# Patient Record
Sex: Female | Born: 1980 | Race: Black or African American | Hispanic: No | Marital: Single | Smoking: Current every day smoker
Health system: Southern US, Community
[De-identification: ages and names within clinical notes are randomized; demographics above are authoritative.]

## PROBLEM LIST (undated history)

## (undated) ENCOUNTER — Inpatient Hospital Stay (HOSPITAL_COMMUNITY): Payer: Self-pay

## (undated) DIAGNOSIS — R87629 Unspecified abnormal cytological findings in specimens from vagina: Secondary | ICD-10-CM

## (undated) DIAGNOSIS — R053 Chronic cough: Secondary | ICD-10-CM

## (undated) DIAGNOSIS — K5792 Diverticulitis of intestine, part unspecified, without perforation or abscess without bleeding: Secondary | ICD-10-CM

## (undated) DIAGNOSIS — B977 Papillomavirus as the cause of diseases classified elsewhere: Secondary | ICD-10-CM

## (undated) DIAGNOSIS — L732 Hidradenitis suppurativa: Secondary | ICD-10-CM

## (undated) DIAGNOSIS — I272 Pulmonary hypertension, unspecified: Secondary | ICD-10-CM

## (undated) DIAGNOSIS — I10 Essential (primary) hypertension: Secondary | ICD-10-CM

## (undated) DIAGNOSIS — M5431 Sciatica, right side: Secondary | ICD-10-CM

## (undated) HISTORY — DX: Sciatica, right side: M54.31

## (undated) HISTORY — DX: Essential (primary) hypertension: I10

## (undated) HISTORY — DX: Unspecified abnormal cytological findings in specimens from vagina: R87.629

## (undated) HISTORY — DX: Hidradenitis suppurativa: L73.2

## (undated) HISTORY — DX: Chronic cough: R05.3

## (undated) HISTORY — DX: Pulmonary hypertension, unspecified: I27.20

## (undated) HISTORY — PX: COLON SURGERY: SHX602

---
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
## 2002-04-14 ENCOUNTER — Emergency Department (HOSPITAL_COMMUNITY): Admission: EM | Admit: 2002-04-14 | Discharge: 2002-04-14 | Payer: Self-pay | Admitting: Emergency Medicine

## 2002-06-05 ENCOUNTER — Emergency Department (HOSPITAL_COMMUNITY): Admission: EM | Admit: 2002-06-05 | Discharge: 2002-06-05 | Payer: Self-pay | Admitting: Emergency Medicine

## 2002-06-05 ENCOUNTER — Encounter: Payer: Self-pay | Admitting: Emergency Medicine

## 2002-06-25 ENCOUNTER — Emergency Department (HOSPITAL_COMMUNITY): Admission: EM | Admit: 2002-06-25 | Discharge: 2002-06-25 | Payer: Self-pay | Admitting: Emergency Medicine

## 2002-07-14 ENCOUNTER — Emergency Department (HOSPITAL_COMMUNITY): Admission: EM | Admit: 2002-07-14 | Discharge: 2002-07-14 | Payer: Self-pay | Admitting: Emergency Medicine

## 2002-08-02 ENCOUNTER — Emergency Department (HOSPITAL_COMMUNITY): Admission: EM | Admit: 2002-08-02 | Discharge: 2002-08-02 | Payer: Self-pay | Admitting: Emergency Medicine

## 2002-09-13 ENCOUNTER — Emergency Department (HOSPITAL_COMMUNITY): Admission: EM | Admit: 2002-09-13 | Discharge: 2002-09-13 | Payer: Self-pay | Admitting: Emergency Medicine

## 2002-09-17 ENCOUNTER — Encounter: Payer: Self-pay | Admitting: Emergency Medicine

## 2002-09-17 ENCOUNTER — Ambulatory Visit (HOSPITAL_COMMUNITY): Admission: RE | Admit: 2002-09-17 | Discharge: 2002-09-17 | Payer: Self-pay | Admitting: Emergency Medicine

## 2004-02-25 ENCOUNTER — Emergency Department (HOSPITAL_COMMUNITY): Admission: EM | Admit: 2004-02-25 | Discharge: 2004-02-25 | Payer: Self-pay | Admitting: Emergency Medicine

## 2004-04-12 ENCOUNTER — Inpatient Hospital Stay (HOSPITAL_COMMUNITY): Admission: AD | Admit: 2004-04-12 | Discharge: 2004-04-12 | Payer: Self-pay | Admitting: Obstetrics

## 2004-04-14 ENCOUNTER — Inpatient Hospital Stay (HOSPITAL_COMMUNITY): Admission: AD | Admit: 2004-04-14 | Discharge: 2004-04-14 | Payer: Self-pay | Admitting: Obstetrics

## 2004-05-04 ENCOUNTER — Inpatient Hospital Stay (HOSPITAL_COMMUNITY): Admission: AD | Admit: 2004-05-04 | Discharge: 2004-05-04 | Payer: Self-pay | Admitting: Family Medicine

## 2004-05-19 ENCOUNTER — Inpatient Hospital Stay (HOSPITAL_COMMUNITY): Admission: AD | Admit: 2004-05-19 | Discharge: 2004-05-19 | Payer: Self-pay | Admitting: Obstetrics & Gynecology

## 2004-05-30 ENCOUNTER — Emergency Department (HOSPITAL_COMMUNITY): Admission: EM | Admit: 2004-05-30 | Discharge: 2004-05-30 | Payer: Self-pay | Admitting: Emergency Medicine

## 2004-08-01 ENCOUNTER — Emergency Department (HOSPITAL_COMMUNITY): Admission: EM | Admit: 2004-08-01 | Discharge: 2004-08-01 | Payer: Self-pay | Admitting: Emergency Medicine

## 2004-08-07 ENCOUNTER — Emergency Department (HOSPITAL_COMMUNITY): Admission: EM | Admit: 2004-08-07 | Discharge: 2004-08-07 | Payer: Self-pay | Admitting: Emergency Medicine

## 2004-10-20 ENCOUNTER — Emergency Department (HOSPITAL_COMMUNITY): Admission: EM | Admit: 2004-10-20 | Discharge: 2004-10-20 | Payer: Self-pay | Admitting: Emergency Medicine

## 2005-01-16 ENCOUNTER — Inpatient Hospital Stay (HOSPITAL_COMMUNITY): Admission: AD | Admit: 2005-01-16 | Discharge: 2005-01-16 | Payer: Self-pay | Admitting: Obstetrics & Gynecology

## 2005-02-22 ENCOUNTER — Ambulatory Visit: Payer: Self-pay | Admitting: Family Medicine

## 2005-02-24 ENCOUNTER — Ambulatory Visit: Payer: Self-pay | Admitting: *Deleted

## 2005-04-10 ENCOUNTER — Encounter (INDEPENDENT_AMBULATORY_CARE_PROVIDER_SITE_OTHER): Payer: Self-pay | Admitting: Family Medicine

## 2005-04-10 ENCOUNTER — Ambulatory Visit: Payer: Self-pay | Admitting: Family Medicine

## 2005-06-07 ENCOUNTER — Inpatient Hospital Stay (HOSPITAL_COMMUNITY): Admission: AD | Admit: 2005-06-07 | Discharge: 2005-06-07 | Payer: Self-pay | Admitting: Gynecology

## 2005-06-14 ENCOUNTER — Inpatient Hospital Stay (HOSPITAL_COMMUNITY): Admission: AD | Admit: 2005-06-14 | Discharge: 2005-06-14 | Payer: Self-pay | Admitting: Obstetrics and Gynecology

## 2005-06-19 ENCOUNTER — Inpatient Hospital Stay (HOSPITAL_COMMUNITY): Admission: AD | Admit: 2005-06-19 | Discharge: 2005-06-19 | Payer: Self-pay | Admitting: Gynecology

## 2005-06-29 ENCOUNTER — Other Ambulatory Visit: Admission: RE | Admit: 2005-06-29 | Discharge: 2005-06-29 | Payer: Self-pay | Admitting: Obstetrics and Gynecology

## 2005-08-14 ENCOUNTER — Emergency Department (HOSPITAL_COMMUNITY): Admission: EM | Admit: 2005-08-14 | Discharge: 2005-08-14 | Payer: Self-pay | Admitting: Emergency Medicine

## 2005-09-11 ENCOUNTER — Inpatient Hospital Stay (HOSPITAL_COMMUNITY): Admission: AD | Admit: 2005-09-11 | Discharge: 2005-09-11 | Payer: Self-pay | Admitting: Obstetrics and Gynecology

## 2005-11-24 ENCOUNTER — Inpatient Hospital Stay (HOSPITAL_COMMUNITY): Admission: AD | Admit: 2005-11-24 | Discharge: 2005-11-24 | Payer: Self-pay | Admitting: Obstetrics and Gynecology

## 2005-12-13 ENCOUNTER — Inpatient Hospital Stay (HOSPITAL_COMMUNITY): Admission: AD | Admit: 2005-12-13 | Discharge: 2005-12-13 | Payer: Self-pay | Admitting: Obstetrics and Gynecology

## 2006-01-16 ENCOUNTER — Inpatient Hospital Stay (HOSPITAL_COMMUNITY): Admission: AD | Admit: 2006-01-16 | Discharge: 2006-01-17 | Payer: Self-pay | Admitting: Obstetrics and Gynecology

## 2006-01-18 ENCOUNTER — Inpatient Hospital Stay (HOSPITAL_COMMUNITY): Admission: AD | Admit: 2006-01-18 | Discharge: 2006-01-18 | Payer: Self-pay | Admitting: Obstetrics and Gynecology

## 2006-02-05 ENCOUNTER — Inpatient Hospital Stay (HOSPITAL_COMMUNITY): Admission: AD | Admit: 2006-02-05 | Discharge: 2006-02-09 | Payer: Self-pay | Admitting: Obstetrics and Gynecology

## 2006-02-06 ENCOUNTER — Encounter (INDEPENDENT_AMBULATORY_CARE_PROVIDER_SITE_OTHER): Payer: Self-pay | Admitting: *Deleted

## 2006-03-09 ENCOUNTER — Other Ambulatory Visit: Admission: RE | Admit: 2006-03-09 | Discharge: 2006-03-09 | Payer: Self-pay | Admitting: Obstetrics and Gynecology

## 2006-04-01 ENCOUNTER — Emergency Department (HOSPITAL_COMMUNITY): Admission: EM | Admit: 2006-04-01 | Discharge: 2006-04-01 | Payer: Self-pay | Admitting: Emergency Medicine

## 2006-06-29 ENCOUNTER — Other Ambulatory Visit: Admission: RE | Admit: 2006-06-29 | Discharge: 2006-06-29 | Payer: Self-pay | Admitting: Obstetrics and Gynecology

## 2006-10-30 ENCOUNTER — Other Ambulatory Visit: Admission: RE | Admit: 2006-10-30 | Discharge: 2006-10-30 | Payer: Self-pay | Admitting: Obstetrics and Gynecology

## 2007-05-06 ENCOUNTER — Inpatient Hospital Stay (HOSPITAL_COMMUNITY): Admission: AD | Admit: 2007-05-06 | Discharge: 2007-05-06 | Payer: Self-pay | Admitting: Family Medicine

## 2008-01-24 ENCOUNTER — Inpatient Hospital Stay (HOSPITAL_COMMUNITY): Admission: AD | Admit: 2008-01-24 | Discharge: 2008-01-24 | Payer: Self-pay | Admitting: Obstetrics & Gynecology

## 2008-02-28 ENCOUNTER — Emergency Department (HOSPITAL_COMMUNITY): Admission: EM | Admit: 2008-02-28 | Discharge: 2008-02-29 | Payer: Self-pay | Admitting: Emergency Medicine

## 2009-12-24 ENCOUNTER — Emergency Department (HOSPITAL_COMMUNITY)
Admission: EM | Admit: 2009-12-24 | Discharge: 2009-12-24 | Payer: Self-pay | Source: Home / Self Care | Admitting: Emergency Medicine

## 2009-12-27 ENCOUNTER — Emergency Department (HOSPITAL_COMMUNITY)
Admission: EM | Admit: 2009-12-27 | Discharge: 2009-12-27 | Payer: Self-pay | Source: Home / Self Care | Admitting: Emergency Medicine

## 2010-04-18 LAB — CBC
HCT: 40.7 % (ref 36.0–46.0)
MCHC: 33 g/dL (ref 30.0–36.0)
MCV: 94.7 fL (ref 78.0–100.0)
Platelets: 294 10*3/uL (ref 150–400)
WBC: 10.2 10*3/uL (ref 4.0–10.5)

## 2010-04-18 LAB — WET PREP, GENITAL
Clue Cells Wet Prep HPF POC: NONE SEEN
Yeast Wet Prep HPF POC: NONE SEEN

## 2010-04-18 LAB — URINALYSIS, ROUTINE W REFLEX MICROSCOPIC: Ketones, ur: NEGATIVE mg/dL

## 2010-04-18 LAB — DIFFERENTIAL
Basophils Absolute: 0.1 10*3/uL (ref 0.0–0.1)
Basophils Relative: 1 % (ref 0–1)
Eosinophils Absolute: 0.3 10*3/uL (ref 0.0–0.7)
Lymphs Abs: 3 10*3/uL (ref 0.7–4.0)
Monocytes Absolute: 0.7 10*3/uL (ref 0.1–1.0)
Neutro Abs: 6.1 10*3/uL (ref 1.7–7.7)

## 2010-04-18 LAB — GC/CHLAMYDIA PROBE AMP, GENITAL: GC Probe Amp, Genital: NEGATIVE

## 2010-04-19 LAB — URINALYSIS, ROUTINE W REFLEX MICROSCOPIC
Bilirubin Urine: NEGATIVE
Glucose, UA: NEGATIVE mg/dL
Hgb urine dipstick: NEGATIVE
Nitrite: NEGATIVE
Protein, ur: NEGATIVE mg/dL
Urobilinogen, UA: 1 mg/dL (ref 0.0–1.0)
pH: 7.5 (ref 5.0–8.0)

## 2010-04-19 LAB — POCT PREGNANCY, URINE: Preg Test, Ur: NEGATIVE

## 2010-05-20 NOTE — Discharge Summary (Signed)
NAMESHANIA, BJELLAND                ACCOUNT NO.:  [id number]   MEDICAL RECORD NO.:  [id number]          PATIENT TYPE:  INP   LOCATION:  9141                          FACILITY:  [facility]   PHYSICIAN:  Rudy Jew. Ashley Royalty, M.D.DATE OF BIRTH:  03/13/1980   DATE OF ADMISSION:  02/05/2006  DATE OF DISCHARGE:  02/09/2006                               DISCHARGE SUMMARY   DISCHARGE DIAGNOSES:  1. Intrauterine pregnancy at 38 weeks 6 days' gestation, delivered.  2. Fibroid uterus.  3. Tobacco use.  4. CIN-1.  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 Spontaneous rupture of membranes.  7. Meconium stained amniotic fluid.  8. Arrest disorder of dilatation in labor.   OPERATIONS AND PROCEDURES:  Primary low transverse cesarean section.   CONSULTATIONS:  None.   DISCHARGE MEDICATIONS:  1. Percocet.  2. Motrin 800 mg.  3. Chromagen.   HISTORY AND PHYSICAL:  This is a 30 year old gravida 1, 38 weeks 6 days'  gestation with the aforementioned diagnoses.  The patient presented to  maternity admissions complaining of contractions.  Initial examination  revealed 3-cm dilatation.  For the remainder of the history and  physical, please see chart.   HOSPITAL COURSE:  The patient was admitted to [facility]  [place].  Admission laboratory studies were drawn.  She experienced  spontaneous rupture of her membranes which revealed meconium-stained  amniotic fluid.  Deceleration was noted and intrauterine pressure  catheter was placed as well as a fetal scalp lead.  The cervix was noted  to be 5-t0-6-cm at 11:35 on February 05, 2006.  Amnioinfusion was  initiated.  On February 06, 2006, at approximately 3 a.m. the patient was noted to  have an arrest disorder of dilatation.  Hence a primary cesarean section  was ordered.  The primary cesarean section was performed on February 06, 2006, and yielded a 7 pounds 1 ounce female, Apgar's of 3 at one minute; 6  at five minutes, 7 at ten minutes, sent to newborn  nursery.  Arterial  cord pH was 7.26.  An anterior fibroid was encountered as well.  The  procedure was uncomplicated.  The patient's postoperative course was characterized by an asymptomatic  anemia.  The patient was felt to be a candidate for discharge home on  February 09, 2006.  She was discharged home afebrile and in satisfactory  condition.   DISPOSITION:  The patient is to return to [facility] and  Obstetrics in 4 days.      James A. Ashley Royalty, M.D.  Electronically Signed     JAM/MEDQ  D:  04/02/2006  T:  04/02/2006  Job:  [id number]

## 2010-05-20 NOTE — Op Note (Signed)
Tina Riggs, Tina Riggs                ACCOUNT NO.:  [id number]   MEDICAL RECORD NO.:  [id number]          PATIENT TYPE:  INP   LOCATION:  9141                          FACILITY:  [facility]   PHYSICIAN:  Rudy Jew. Ashley Royalty, M.D.DATE OF BIRTH:  Oct 16, 1980   DATE OF PROCEDURE:  02/06/2006  DATE OF DISCHARGE:                               OPERATIVE REPORT   PREOPERATIVE DIAGNOSES:  1. Intrauterine pregnancy at 32 weeks' gestation.  2. Fibroid uterus.  3. Meconium-stained amniotic fluid -- particulate.  4. Arrest disorder of dilatation in labor.   POSTOPERATIVE DIAGNOSES:  1. Intrauterine pregnancy at 70 weeks' gestation.  2. Fibroid uterus.  3. Meconium-stained amniotic fluid -- particulate.  4. Arrest disorder of dilatation in labor.   PROCEDURE:  Primary low transverse cesarean section.   SURGEON:  Rudy Jew. Ashley Royalty, M.D.   ANESTHESIA:  Epidural, then general endotracheal.   FINDINGS:  A 7-pound 1-ounce female, Apgars of 3 at 1 minute, 6 at 5  minutes and 7 at 10 minutes, sent to the newborn nursery.  Arterial cord  pH 7.26.   ESTIMATED BLOOD LOSS:  800 mL.   COMPLICATIONS:  None.   PACK AND DRAINS:  Foley.   SPONGE NEEDLE AND INSTRUMENT COUNTS:  Reported as correct x2.   PROCEDURE:  The patient was taken to the operating room and placed in  the dorsal supine position.  Epidural anesthetic was dosed to a surgical  level.  The patient was then prepped and draped in the usual manner for  abdominal surgery.  Foley catheter had been previously placed.   The epidural had been noted to be somewhat spotty during labor and the  anesthesiologist requested some local 0.25% Marcaine to be injected into  the skin; approximately 10 mL were injected.  The patient felt the  infections despite the intended surgical levels of epidural anesthetic.  Hence, the decision was made by anesthesia to proceed with general  anesthesia.  After general endotracheal anesthesia was initiated, a  Pfannenstiel  incision was made down the fascia, which was nicked with a  knife and incised transversely with Mayo scissors.  The underlying  rectus muscles were separated from the fascia using sharp and blunt  dissection.  The rectus muscles were separated in the midline, exposing  the peritoneum, which was entered atraumatically with Metzenbaum  scissors.  The incision was extended longitudinally.  The self-retaining  retractor was placed in the abdominal cavity and positioned  appropriately.  The uterus was identified and a bladder flap created by  incising the anterior uterine serosa and sharply and bluntly dissecting  the bladder inferiorly.  It was held in place with the bladder blade.  The uterus was then entered through a low transverse incision using  sharp and blunt dissection.  Meconium-stained amniotic fluid was noted,  which was somewhat particulate.  The infant was then delivered from the  vertex presentation.  After delivery of the head, DeLee suction was  employed of the nasopharynx and oropharynx.  After delivery of the body,  the cord was triply clamped and cut and the infant given immediately  to  the awaiting pediatrics team, Dr. Eric Form in attendance.  Arterial cord  pH was obtained from an isolated segment of the umbilical cord.  Then  regular cord blood was obtained.  The placenta and membranes were  removed in their entirety and submitted to Pathology for histologic  studies.  The uterus was exteriorized.  A 3- to 4-cm anterior fundal  fibroid was encountered.  The uterus, tubes and ovaries were otherwise  normal in appearance.  The uterus was then closed in 2 running layers of  #1 Vicryl.  The first was a running locking layer.  The second was a  running, intermittently locking, and imbricating layer.  One additional  figure-of-eight suture was required to obtain hemostasis.  Hemostasis  was noted.  Uterus, tubes and ovaries were returned to the abdominal  cavity.  Irrigation was  accomplished.  Hemostasis was once again noted.  The peritoneum was then closed with 3-0 Vicryl in a running fashion.  The fascia was closed with 0 Vicryl in a running fashion.  The skin was  closed with staples.   The patient tolerated the procedure extremely well and was returned to  the recovery room in good condition.  At the conclusion of the  procedure, the urine was clear and copious.      James A. Ashley Royalty, M.D.  Electronically Signed     JAM/MEDQ  D:  02/06/2006  T:  02/06/2006  Job:  [id number]

## 2010-08-15 ENCOUNTER — Emergency Department (HOSPITAL_COMMUNITY)
Admission: EM | Admit: 2010-08-15 | Discharge: 2010-08-15 | Disposition: A | Discharge status: Home / Self Care | Payer: Medicaid Other | Attending: Emergency Medicine | Admitting: Emergency Medicine

## 2010-08-15 DIAGNOSIS — Y92009 Unspecified place in unspecified non-institutional (private) residence as the place of occurrence of the external cause: Secondary | ICD-10-CM | POA: Insufficient documentation

## 2010-08-15 DIAGNOSIS — W2203XA Walked into furniture, initial encounter: Secondary | ICD-10-CM | POA: Insufficient documentation

## 2010-08-15 DIAGNOSIS — S91109A Unspecified open wound of unspecified toe(s) without damage to nail, initial encounter: Secondary | ICD-10-CM | POA: Insufficient documentation

## 2010-08-27 ENCOUNTER — Inpatient Hospital Stay (INDEPENDENT_AMBULATORY_CARE_PROVIDER_SITE_OTHER)
Admission: RE | Admit: 2010-08-27 | Discharge: 2010-08-27 | Disposition: A | Discharge status: Home / Self Care | Payer: Medicaid Other | Source: Ambulatory Visit | Attending: Emergency Medicine | Admitting: Emergency Medicine

## 2010-08-27 DIAGNOSIS — B356 Tinea cruris: Secondary | ICD-10-CM

## 2010-08-27 DIAGNOSIS — R110 Nausea: Secondary | ICD-10-CM

## 2010-08-27 LAB — POCT URINALYSIS DIP (DEVICE)
Ketones, ur: NEGATIVE mg/dL
Protein, ur: NEGATIVE mg/dL
Specific Gravity, Urine: 1.025 (ref 1.005–1.030)
Urobilinogen, UA: 2 mg/dL — ABNORMAL HIGH (ref 0.0–1.0)

## 2010-08-27 LAB — WET PREP, GENITAL: Trich, Wet Prep: NONE SEEN

## 2010-08-29 LAB — GC/CHLAMYDIA PROBE AMP, GENITAL: GC Probe Amp, Genital: NEGATIVE

## 2010-08-29 LAB — URINE CULTURE: Colony Count: 30000

## 2010-09-03 HISTORY — PX: COLPOSCOPY: SHX161

## 2010-12-29 ENCOUNTER — Inpatient Hospital Stay (HOSPITAL_COMMUNITY)
Admission: AD | Admit: 2010-12-29 | Discharge: 2010-12-29 | Disposition: A | Discharge status: Home / Self Care | Payer: Medicaid Other | Source: Ambulatory Visit | Attending: Obstetrics | Admitting: Obstetrics

## 2010-12-29 ENCOUNTER — Encounter (HOSPITAL_COMMUNITY): Payer: Self-pay | Admitting: *Deleted

## 2010-12-29 DIAGNOSIS — N898 Other specified noninflammatory disorders of vagina: Secondary | ICD-10-CM

## 2010-12-29 LAB — WET PREP, GENITAL
Clue Cells Wet Prep HPF POC: NONE SEEN
Trich, Wet Prep: NONE SEEN

## 2010-12-29 LAB — URINALYSIS, ROUTINE W REFLEX MICROSCOPIC
Ketones, ur: NEGATIVE mg/dL
Leukocytes, UA: NEGATIVE
Nitrite: NEGATIVE
Protein, ur: NEGATIVE mg/dL
Urobilinogen, UA: 1 mg/dL (ref 0.0–1.0)

## 2010-12-29 LAB — URINE MICROSCOPIC-ADD ON

## 2010-12-29 MED ORDER — METRONIDAZOLE 500 MG PO TABS: 500.0000 mg | ORAL_TABLET | Freq: Two times a day (BID) | ORAL | Status: AC

## 2010-12-29 NOTE — ED Provider Notes (Signed)
History     Chief Complaint  Patient presents with  . Vaginal Discharge   HPI  Pt here with report of white vaginal discharge x 2 weeks.  Denies odor.  +vaginal bump that started yesterday.  +itching.  "Comes and goes".  Intermittent lower pelvic pain, none today.    Past Medical History  Diagnosis Date  . No pertinent past medical history     Past Surgical History  Procedure Date  . Cesarean section   . Colposcopy Sep 2012    No family history on file.  History  Substance Use Topics  . Smoking status: Current Everyday Smoker -- 0.5 packs/day for 12 years    Types: Cigarettes  . Smokeless tobacco: Not on file  . Alcohol Use: Yes     alcohol at least once a week    Allergies:  Allergies  Allergen Reactions  . Penicillins Rash    Prescriptions prior to admission  Medication Sig Dispense Refill  . Probiotic Product (ALIGN) 4 MG CAPS Take by mouth.          Review of Systems  Gastrointestinal: Negative for abdominal pain.  Genitourinary:       Vaginal discharge and lesion  All other systems reviewed and are negative.   Physical Exam   Blood pressure 134/93, pulse 81, temperature 98.7 F (37.1 C), temperature source Oral, resp. rate 20, height 5\' 6"  (1.676 m), weight 121.201 kg (267 lb 3.2 oz), SpO2 99.00%.  Physical Exam  Constitutional: She is oriented to person, place, and time. She appears well-developed and well-nourished. No distress.  HENT:  Head: Normocephalic.  Neck: Normal range of motion. Neck supple.  GI: Soft. She exhibits no mass. There is no tenderness. There is no guarding.  Genitourinary:    Vaginal discharge (white, creamy) found.       IUD strings visualized; apparatus not seen  Neurological: She is alert and oriented to person, place, and time. She has normal reflexes.  Skin: Skin is warm and dry.    MAU Course  Procedures Results for orders placed during the hospital encounter of 12/29/10 (from the past 24 hour(s))  URINALYSIS,  ROUTINE W REFLEX MICROSCOPIC     Status: Abnormal   Collection Time   12/29/10  4:50 PM      Component Value Range   Color, Urine YELLOW  YELLOW    APPearance HAZY (*) CLEAR    Specific Gravity, Urine >1.030 (*) 1.005 - 1.030    pH 6.0  5.0 - 8.0    Glucose, UA NEGATIVE  NEGATIVE (mg/dL)   Hgb urine dipstick SMALL (*) NEGATIVE    Bilirubin Urine NEGATIVE  NEGATIVE    Ketones, ur NEGATIVE  NEGATIVE (mg/dL)   Protein, ur NEGATIVE  NEGATIVE (mg/dL)   Urobilinogen, UA 1.0  0.0 - 1.0 (mg/dL)   Nitrite NEGATIVE  NEGATIVE    Leukocytes, UA NEGATIVE  NEGATIVE   URINE MICROSCOPIC-ADD ON     Status: Abnormal   Collection Time   12/29/10  4:50 PM      Component Value Range   Squamous Epithelial / LPF FEW (*) RARE    RBC / HPF 3-6  <3 (RBC/hpf)   Bacteria, UA RARE  RARE    Urine-Other MUCOUS PRESENT    WET PREP, GENITAL     Status: Abnormal   Collection Time   12/29/10  6:00 PM      Component Value Range   Yeast, Wet Prep NONE SEEN  NONE SEEN  Trich, Wet Prep NONE SEEN  NONE SEEN    Clue Cells, Wet Prep NONE SEEN  NONE SEEN    WBC, Wet Prep HPF POC MODERATE (*) NONE SEEN       Assessment and Plan  Vaginal Discharge  Plan: DC to home Rx Flagyl (per pt request) F/U prn  [facility] 12/29/2010, 6:12 PM

## 2010-12-29 NOTE — Progress Notes (Signed)
Patient states she has been having abdominal pain on and off, no pain at this time. Has a rash on her back and arms for 1-2 months. Has recurrent BV and is having a vaginal discharge that is watery. Has a bump in the hair and wants it checked. Has a Mirena since 2008 and wants it checked and possibly removed.

## 2010-12-31 LAB — GC/CHLAMYDIA PROBE AMP, GENITAL
Chlamydia, DNA Probe: NEGATIVE
GC Probe Amp, Genital: NEGATIVE

## 2011-08-09 ENCOUNTER — Encounter (HOSPITAL_BASED_OUTPATIENT_CLINIC_OR_DEPARTMENT_OTHER): Payer: Self-pay

## 2011-08-09 ENCOUNTER — Emergency Department (HOSPITAL_BASED_OUTPATIENT_CLINIC_OR_DEPARTMENT_OTHER)
Admission: EM | Admit: 2011-08-09 | Discharge: 2011-08-09 | Disposition: A | Discharge status: Home / Self Care | Payer: No Typology Code available for payment source | Attending: Emergency Medicine | Admitting: Emergency Medicine

## 2011-08-09 DIAGNOSIS — L03211 Cellulitis of face: Secondary | ICD-10-CM | POA: Insufficient documentation

## 2011-08-09 DIAGNOSIS — L0201 Cutaneous abscess of face: Secondary | ICD-10-CM | POA: Insufficient documentation

## 2011-08-09 DIAGNOSIS — Z880 Allergy status to penicillin: Secondary | ICD-10-CM | POA: Insufficient documentation

## 2011-08-09 DIAGNOSIS — L0291 Cutaneous abscess, unspecified: Secondary | ICD-10-CM

## 2011-08-09 DIAGNOSIS — F17200 Nicotine dependence, unspecified, uncomplicated: Secondary | ICD-10-CM | POA: Insufficient documentation

## 2011-08-09 MED ORDER — SULFAMETHOXAZOLE-TRIMETHOPRIM 800-160 MG PO TABS: 1.0000 | ORAL_TABLET | Freq: Two times a day (BID) | ORAL | Status: DC

## 2011-08-09 MED ORDER — TRAMADOL HCL 50 MG PO TABS: 50.0000 mg | ORAL_TABLET | Freq: Four times a day (QID) | ORAL | Status: DC | PRN

## 2011-08-09 NOTE — ED Provider Notes (Signed)
History     CSN: [id number]  Arrival date & time 08/09/11  1439   First MD Initiated Contact with Patient 08/09/11 1548      Chief Complaint  Patient presents with  . Rash    (Consider location/radiation/quality/duration/timing/severity/associated sxs/prior treatment) HPI  31 y.o. female in no acute distress complaining of several painful cysts to area in between eyebrows. Patient had the smaller cyst on the right for several weeks and recently developed a larger cyst on the last several days ago which is growing rapidly. Patient denies any fever, change in vision, redness to the eye or drainage from eye.  Past Medical History  Diagnosis Date  . No pertinent past medical history     Past Surgical History  Procedure Date  . Cesarean section   . Colposcopy Sep 2012    No family history on file.  History  Substance Use Topics  . Smoking status: Current Everyday Smoker -- 0.5 packs/day for 12 years    Types: Cigarettes  . Smokeless tobacco: Not on file  . Alcohol Use: Yes     alcohol at least once a week    OB History    Grav Para Term Preterm Abortions TAB SAB Ect Mult Living   1 1 1       1       Review of Systems  Skin:        Painful swelling lesions to area in between the eyebrows.  All other systems reviewed and are negative.    Allergies  Penicillins  Home Medications   Current Outpatient Rx  Name Route Sig Dispense Refill  . ALIGN 4 MG PO CAPS Oral Take by mouth.      . SULFAMETHOXAZOLE-TRIMETHOPRIM 800-160 MG PO TABS Oral Take 1 tablet by mouth 2 (two) times daily. 14 tablet 0  . TRAMADOL HCL 50 MG PO TABS Oral Take 1 tablet (50 mg total) by mouth every 6 (six) hours as needed for pain. 15 tablet 0    BP 130/103  Pulse 74  Temp 98.3 F (36.8 C) (Oral)  Resp 16  Ht 5\' 6"  (1.676 m)  Wt 230 lb (104.327 kg)  BMI 37.12 kg/m2  SpO2 100%  Physical Exam  Nursing note and vitals reviewed. Constitutional: She is oriented to person, place, and  time. She appears well-developed and well-nourished. No distress.  HENT:  Head: Normocephalic.  Eyes: Conjunctivae and EOM are normal. Pupils are equal, round, and reactive to light. Right eye exhibits no discharge. Left eye exhibits no discharge. No scleral icterus.       No conjunctival injection bilaterally. No discharge  Cardiovascular: Normal rate.   Pulmonary/Chest: Effort normal.  Abdominal: Soft.  Musculoskeletal: Normal range of motion.  Neurological: She is alert and oriented to person, place, and time.  Skin:       2 indurated lesions one approximately 5 mm and the other approximately 8 mm to area between the eyebrows. Lesions are tender with no fluctuance. No swelling of the eyelids  Psychiatric: She has a normal mood and affect.    ED Course  Procedures (including critical care time)  Labs Reviewed - No data to display No results found.   1. Abscess       MDM  I doubt dacryocystitis based on the location and lack of discharge or conjunctival involvement. I think this is a case of cystic acne or abscess to the area in between the eyebrows. Based on the level of firmness and induration  I do not think that incision and drainage would be beneficial at this time. I will encourage the patient to take antibiotics and apply warm compresses off into a chair the area so fluctuance may be palpated an incision and drainage will be effective. I will give her Bactrim DS by mouth twice a day X7 in addition to tramadol for comfort. Pt verbalized understanding and agrees with care plan. Outpatient follow-up and return precautions given.           Wynetta Emery, PA-C 08/09/11 1620

## 2011-08-09 NOTE — ED Notes (Signed)
Pt reports "bumps" on eyelid.

## 2011-08-09 NOTE — ED Provider Notes (Signed)
Medical screening examination/treatment/procedure(s) were performed by non-physician practitioner and as supervising physician I was immediately available for consultation/collaboration.   Carleene Cooper III, MD 08/09/11 [phone]

## 2011-08-10 ENCOUNTER — Encounter (HOSPITAL_BASED_OUTPATIENT_CLINIC_OR_DEPARTMENT_OTHER): Payer: Self-pay

## 2011-08-10 ENCOUNTER — Emergency Department (HOSPITAL_BASED_OUTPATIENT_CLINIC_OR_DEPARTMENT_OTHER)
Admission: EM | Admit: 2011-08-10 | Discharge: 2011-08-10 | Disposition: A | Discharge status: Home / Self Care | Payer: No Typology Code available for payment source | Attending: Emergency Medicine | Admitting: Emergency Medicine

## 2011-08-10 DIAGNOSIS — F17200 Nicotine dependence, unspecified, uncomplicated: Secondary | ICD-10-CM | POA: Insufficient documentation

## 2011-08-10 DIAGNOSIS — L0201 Cutaneous abscess of face: Secondary | ICD-10-CM | POA: Insufficient documentation

## 2011-08-10 DIAGNOSIS — L03211 Cellulitis of face: Secondary | ICD-10-CM | POA: Insufficient documentation

## 2011-08-10 NOTE — ED Notes (Signed)
C/o bump on left eye lid-was seen yesterday and advised to come back if started having d/c-states d/c stared approx 1 hour PTA

## 2011-08-10 NOTE — ED Provider Notes (Signed)
History     CSN: [id number]  Arrival date & time 08/10/11  2059   First MD Initiated Contact with Patient 08/10/11 2128      Chief Complaint  Patient presents with  . Follow-up    (Consider location/radiation/quality/duration/timing/severity/associated sxs/prior treatment) HPI Comments: Tina Riggs is a 31 y.o. Female who presents with complaint of swelling, drainage wound to the left eye lid. States was seen for the same yesterday. Was treated here with antibiotics, pain medications, states was told to return if started draining or worsened. Pt requesting cultures to make sure not MRSA. Pt stats it opened up some and began draining. No fever, Chills, malaise. Wound is the the medial left upper eye lid.    Past Medical History  Diagnosis Date  . No pertinent past medical history     Past Surgical History  Procedure Date  . Cesarean section   . Colposcopy Sep 2012    No family history on file.  History  Substance Use Topics  . Smoking status: Current Everyday Smoker -- 0.5 packs/day for 12 years    Types: Cigarettes  . Smokeless tobacco: Not on file  . Alcohol Use: Yes     alcohol at least once a week    OB History    Grav Para Term Preterm Abortions TAB SAB Ect Mult Living   1 1 1       1       Review of Systems  Constitutional: Negative for fever and chills.  HENT: Negative for neck pain and neck stiffness.   Eyes: Positive for pain.  Respiratory: Negative.   Cardiovascular: Negative.   Skin: Positive for wound.    Allergies  Penicillins  Home Medications   Current Outpatient Rx  Name Route Sig Dispense Refill  . SULFAMETHOXAZOLE-TRIMETHOPRIM 800-160 MG PO TABS Oral Take 1 tablet by mouth 2 (two) times daily.    . TRAMADOL HCL 50 MG PO TABS Oral Take 50 mg by mouth every 6 (six) hours as needed. For pain.      BP 140/93  Pulse 59  Temp 98.2 F (36.8 C) (Oral)  Resp 18  Ht 5\' 6"  (1.676 m)  Wt 235 lb (106.595 kg)  BMI 37.93 kg/m2  SpO2  100%  Physical Exam  Nursing note and vitals reviewed. Constitutional: She appears well-developed and well-nourished. No distress.  HENT:  Head: Normocephalic.  Eyes: Conjunctivae and EOM are normal. Pupils are equal, round, and reactive to light.       There is a 2cm abscess to the medical left upper eye lid adjacent to the nose. Purulent drainage with mild compression. Tender. No surrounding cellulitis. No pain with extraocular movement  Neck: Normal range of motion. Neck supple.  Cardiovascular: Normal rate, regular rhythm and normal heart sounds.   Pulmonary/Chest: Effort normal and breath sounds normal. No respiratory distress. She has no wheezes. She has no rales.  Lymphadenopathy:    She has no cervical adenopathy.  Skin: Skin is warm and dry.  Psychiatric: She has a normal mood and affect.    ED Course  Procedures (including critical care time)  I cleaned pts abscess with alcohol and iodine. Used 21 gauge sterile needle to make an opening. Purulent drainage out. Large amount. Pt on bactrim for infection. i sent cultures. Pain well managed at home. Recommended warm compresses. Follow up as needed if worsening.   1. Abscess of face       MDM  Lottie Mussel, [place] 08/10/11 2204

## 2011-08-11 NOTE — ED Provider Notes (Signed)
Medical screening examination/treatment/procedure(s) were performed by non-physician practitioner and as supervising physician I was immediately available for consultation/collaboration.    Vida Roller, MD 08/11/11 [phone]

## 2011-08-13 LAB — WOUND CULTURE
Culture: NO GROWTH
Special Requests: NORMAL

## 2011-09-10 ENCOUNTER — Emergency Department (HOSPITAL_BASED_OUTPATIENT_CLINIC_OR_DEPARTMENT_OTHER)
Admission: EM | Admit: 2011-09-10 | Discharge: 2011-09-10 | Disposition: A | Discharge status: Home / Self Care | Payer: Medicaid Other

## 2011-09-18 ENCOUNTER — Emergency Department (HOSPITAL_BASED_OUTPATIENT_CLINIC_OR_DEPARTMENT_OTHER)
Admission: EM | Admit: 2011-09-18 | Discharge: 2011-09-18 | Disposition: A | Discharge status: Home / Self Care | Payer: PRIVATE HEALTH INSURANCE | Attending: Emergency Medicine | Admitting: Emergency Medicine

## 2011-09-18 ENCOUNTER — Encounter (HOSPITAL_BASED_OUTPATIENT_CLINIC_OR_DEPARTMENT_OTHER): Payer: Self-pay | Admitting: *Deleted

## 2011-09-18 DIAGNOSIS — L03211 Cellulitis of face: Secondary | ICD-10-CM | POA: Insufficient documentation

## 2011-09-18 DIAGNOSIS — L0291 Cutaneous abscess, unspecified: Secondary | ICD-10-CM

## 2011-09-18 DIAGNOSIS — Z880 Allergy status to penicillin: Secondary | ICD-10-CM | POA: Insufficient documentation

## 2011-09-18 DIAGNOSIS — L0201 Cutaneous abscess of face: Secondary | ICD-10-CM | POA: Insufficient documentation

## 2011-09-18 DIAGNOSIS — F17200 Nicotine dependence, unspecified, uncomplicated: Secondary | ICD-10-CM | POA: Insufficient documentation

## 2011-09-18 MED ORDER — SULFAMETHOXAZOLE-TRIMETHOPRIM 800-160 MG PO TABS: 2.0000 | ORAL_TABLET | Freq: Two times a day (BID) | ORAL | Status: DC

## 2011-09-18 MED ORDER — CEPHALEXIN 500 MG PO CAPS: 1000.0000 mg | ORAL_CAPSULE | Freq: Two times a day (BID) | ORAL | Status: DC

## 2011-09-18 NOTE — ED Provider Notes (Signed)
History     CSN: [id number]  Arrival date & time 09/18/11  1558   First MD Initiated Contact with Patient 09/18/11 1626      Chief Complaint  Patient presents with  . Abscess    (Consider location/radiation/quality/duration/timing/severity/associated sxs/prior treatment) HPI  31 y.o. female in no acute distress complaining of recurrent abscess to area between her eyebrows. Patient was seen for similar here several weeks ago and followed with her primary care Dr. Who instructed her to return to the emergency room for abscess evaluation. Patient denies any fever, nausea vomiting. She states that there is a scant amount of purulent drainage when she pushes on it.  Past Medical History  Diagnosis Date  . No pertinent past medical history     Past Surgical History  Procedure Date  . Cesarean section   . Colposcopy Sep 2012    No family history on file.  History  Substance Use Topics  . Smoking status: Current Every Day Smoker -- 0.5 packs/day for 12 years    Types: Cigarettes  . Smokeless tobacco: Not on file  . Alcohol Use: Yes     alcohol at least once a week    OB History    Grav Para Term Preterm Abortions TAB SAB Ect Mult Living   1 1 1       1       Review of Systems  Constitutional: Negative for fever.  Respiratory: Negative for shortness of breath.   Cardiovascular: Negative for chest pain.  Gastrointestinal: Negative for nausea, vomiting, abdominal pain and diarrhea.  Skin: Positive for rash.  All other systems reviewed and are negative.    Allergies  Penicillins  Home Medications  No current outpatient prescriptions on file.  BP 143/96  Pulse 64  Temp 98.1 F (36.7 C) (Oral)  Resp 18  SpO2 100%  Physical Exam  Nursing note and vitals reviewed. Constitutional: She is oriented to person, place, and time. She appears well-developed and well-nourished. No distress.  HENT:  Head: Normocephalic.  Eyes: Conjunctivae normal and EOM are normal.    Cardiovascular: Normal rate.   Pulmonary/Chest: Effort normal. No stridor.  Abdominal: Soft. Bowel sounds are normal.  Musculoskeletal: Normal range of motion.  Neurological: She is alert and oriented to person, place, and time.  Skin:       2x 1/4 to 1/2 cm papules to area between the eyebrows. No fluctuance.   Psychiatric: She has a normal mood and affect.    ED Course  Procedures (including critical care time)  Labs Reviewed - No data to display No results found.   1. Abscess       MDM   Small papules/abscesses to forehead. I will advise patient to follow with dermatology and write her a prescription for antibiotics.  Pt verbalized understanding and agrees with care plan. Outpatient follow-up and return precautions given.           Wynetta Emery, PA-C 09/18/11 1715

## 2011-09-18 NOTE — ED Notes (Signed)
Skin Abscess x 2 between her eyes.

## 2011-09-19 NOTE — ED Provider Notes (Signed)
Medical screening examination/treatment/procedure(s) were performed by non-physician practitioner and as supervising physician I was immediately available for consultation/collaboration.  Hurman Horn, MD 09/19/11 [phone]

## 2012-01-06 ENCOUNTER — Emergency Department (HOSPITAL_BASED_OUTPATIENT_CLINIC_OR_DEPARTMENT_OTHER)
Admission: EM | Admit: 2012-01-06 | Discharge: 2012-01-06 | Disposition: A | Discharge status: Home / Self Care | Payer: Self-pay | Attending: Emergency Medicine | Admitting: Emergency Medicine

## 2012-01-06 ENCOUNTER — Encounter (HOSPITAL_BASED_OUTPATIENT_CLINIC_OR_DEPARTMENT_OTHER): Payer: Self-pay | Admitting: *Deleted

## 2012-01-06 DIAGNOSIS — Z975 Presence of (intrauterine) contraceptive device: Secondary | ICD-10-CM | POA: Insufficient documentation

## 2012-01-06 DIAGNOSIS — Z309 Encounter for contraceptive management, unspecified: Secondary | ICD-10-CM | POA: Insufficient documentation

## 2012-01-06 DIAGNOSIS — N898 Other specified noninflammatory disorders of vagina: Secondary | ICD-10-CM | POA: Insufficient documentation

## 2012-01-06 DIAGNOSIS — Z202 Contact with and (suspected) exposure to infections with a predominantly sexual mode of transmission: Secondary | ICD-10-CM | POA: Insufficient documentation

## 2012-01-06 DIAGNOSIS — R109 Unspecified abdominal pain: Secondary | ICD-10-CM | POA: Insufficient documentation

## 2012-01-06 DIAGNOSIS — Z3202 Encounter for pregnancy test, result negative: Secondary | ICD-10-CM | POA: Insufficient documentation

## 2012-01-06 DIAGNOSIS — F17200 Nicotine dependence, unspecified, uncomplicated: Secondary | ICD-10-CM | POA: Insufficient documentation

## 2012-01-06 LAB — URINE MICROSCOPIC-ADD ON

## 2012-01-06 LAB — URINALYSIS, ROUTINE W REFLEX MICROSCOPIC
Bilirubin Urine: NEGATIVE
Glucose, UA: NEGATIVE mg/dL
Ketones, ur: NEGATIVE mg/dL
Protein, ur: NEGATIVE mg/dL
Urobilinogen, UA: 1 mg/dL (ref 0.0–1.0)

## 2012-01-06 LAB — WET PREP, GENITAL

## 2012-01-06 LAB — PREGNANCY, URINE: Preg Test, Ur: NEGATIVE

## 2012-01-06 MED ORDER — LIDOCAINE HCL (PF) 1 % IJ SOLN
INTRAMUSCULAR | Status: AC
  Administered 2012-01-06: 2.3 mL
  Filled 2012-01-06: qty 5

## 2012-01-06 MED ORDER — CEFTRIAXONE SODIUM 250 MG IJ SOLR
250.0000 mg | Freq: Once | INTRAMUSCULAR | Status: AC
  Administered 2012-01-06: 250 mg via INTRAMUSCULAR
  Filled 2012-01-06: qty 250

## 2012-01-06 MED ORDER — CEFTRIAXONE SODIUM 250 MG IJ SOLR: 250.0000 mg | INTRAMUSCULAR | Status: DC

## 2012-01-06 MED ORDER — AZITHROMYCIN 250 MG PO TABS
1000.0000 mg | ORAL_TABLET | Freq: Once | ORAL | Status: AC
  Administered 2012-01-06: 1000 mg via ORAL
  Filled 2012-01-06: qty 4

## 2012-01-06 NOTE — ED Notes (Signed)
Patient would like to be checked for STD's. States that her ex-partner told her she may want to be seen.

## 2012-01-06 NOTE — ED Provider Notes (Signed)
History     CSN: [id number]  Arrival date & time 01/06/12  1105   First MD Initiated Contact with Patient 01/06/12 1119      Chief Complaint  Patient presents with  . Exposure to STD    (Consider location/radiation/quality/duration/timing/severity/associated sxs/prior treatment) HPI Comments: Patient presents with an STD exposure. She states that her ex-partner has advised her that she needs to get checked for STDs but did not indicate which STD that he had. She has had a history of STDs in the past but cannot clarify the exact one. She's had some intermittent low abdominal cramping but denies any abdominal pain now. She denies any vaginal discharge. She's had a little bit of itching and burning around her vaginal area. She has a birth control implant in and has had some spasms body noted the last 2-3 days. She denies any nausea vomiting or diarrhea. She denies any UTI symptoms.  Patient is a 32 y.o. female presenting with STD exposure.  Exposure to STD Associated symptoms include abdominal pain. Pertinent negatives include no chest pain, no headaches and no shortness of breath.    Past Medical History  Diagnosis Date  . No pertinent past medical history     Past Surgical History  Procedure Date  . Cesarean section   . Colposcopy Sep 2012    No family history on file.  History  Substance Use Topics  . Smoking status: Current Every Day Smoker -- 0.5 packs/day for 12 years    Types: Cigarettes  . Smokeless tobacco: Not on file  . Alcohol Use: Yes     Comment: alcohol at least once a week    OB History    Grav Para Term Preterm Abortions TAB SAB Ect Mult Living   1 1 1       1       Review of Systems  Constitutional: Negative for fever, chills, diaphoresis and fatigue.  HENT: Negative for congestion, rhinorrhea and sneezing.   Eyes: Negative.   Respiratory: Negative for cough, chest tightness and shortness of breath.   Cardiovascular: Negative for chest pain and leg  swelling.  Gastrointestinal: Positive for abdominal pain. Negative for nausea, vomiting, diarrhea and blood in stool.  Genitourinary: Positive for vaginal bleeding. Negative for frequency, hematuria, flank pain and difficulty urinating.  Musculoskeletal: Negative for back pain and arthralgias.  Skin: Negative for rash.  Neurological: Negative for dizziness, speech difficulty, weakness, numbness and headaches.    Allergies  Penicillins  Home Medications   Current Outpatient Rx  Name  Route  Sig  Dispense  Refill  . CEPHALEXIN 500 MG PO CAPS   Oral   Take 2 capsules (1,000 mg total) by mouth 2 (two) times daily.   28 capsule   0   . SULFAMETHOXAZOLE-TRIMETHOPRIM 800-160 MG PO TABS   Oral   Take 2 tablets by mouth 2 (two) times daily.   28 tablet   0     There were no vitals taken for this visit.  Physical Exam  Constitutional: She is oriented to person, place, and time. She appears well-developed and well-nourished.  HENT:  Head: Normocephalic and atraumatic.  Eyes: Pupils are equal, round, and reactive to light.  Neck: Normal range of motion. Neck supple.  Cardiovascular: Normal rate, regular rhythm and normal heart sounds.   Pulmonary/Chest: Effort normal and breath sounds normal. No respiratory distress. She has no wheezes. She has no rales. She exhibits no tenderness.  Abdominal: Soft. Bowel sounds are normal. There  is no tenderness. There is no rebound and no guarding.  Genitourinary:       Small amount of dark blood in the vault. No active bleeding. No discharge is noted. No rashes or lesions are noted. No cervical motion tenderness or adnexal tenderness.  Musculoskeletal: Normal range of motion. She exhibits no edema.  Lymphadenopathy:    She has no cervical adenopathy.  Neurological: She is alert and oriented to person, place, and time.  Skin: Skin is warm and dry. No rash noted.  Psychiatric: She has a normal mood and affect.    ED Course  Procedures  (including critical care time)  Results for orders placed during the hospital encounter of 01/06/12  PREGNANCY, URINE      Component Value Range   Preg Test, Ur NEGATIVE  NEGATIVE  WET PREP, GENITAL      Component Value Range   Yeast Wet Prep HPF POC NONE SEEN  NONE SEEN   Trich, Wet Prep NONE SEEN  NONE SEEN   Clue Cells Wet Prep HPF POC FEW (*) NONE SEEN   WBC, Wet Prep HPF POC FEW (*) NONE SEEN   No results found.    1. Exposure to STD       MDM  Patient was treated with Rocephin and Zithromax here in emergency pertinent for STD exposure. She's currently asymptomatic. I did counsel her regarding going to the health department for her gynecologist for further testing such as HIV and syphilis.        Rolan Bucco, MD 01/06/12 [phone]

## 2012-01-08 LAB — GC/CHLAMYDIA PROBE AMP
CT Probe RNA: NEGATIVE
GC Probe RNA: NEGATIVE

## 2012-02-07 ENCOUNTER — Encounter (HOSPITAL_BASED_OUTPATIENT_CLINIC_OR_DEPARTMENT_OTHER): Payer: Self-pay

## 2012-02-07 ENCOUNTER — Emergency Department (HOSPITAL_BASED_OUTPATIENT_CLINIC_OR_DEPARTMENT_OTHER)
Admission: EM | Admit: 2012-02-07 | Discharge: 2012-02-07 | Disposition: A | Discharge status: Home / Self Care | Payer: Self-pay | Attending: Emergency Medicine | Admitting: Emergency Medicine

## 2012-02-07 DIAGNOSIS — N898 Other specified noninflammatory disorders of vagina: Secondary | ICD-10-CM | POA: Insufficient documentation

## 2012-02-07 DIAGNOSIS — N760 Acute vaginitis: Secondary | ICD-10-CM | POA: Insufficient documentation

## 2012-02-07 DIAGNOSIS — Z79899 Other long term (current) drug therapy: Secondary | ICD-10-CM | POA: Insufficient documentation

## 2012-02-07 DIAGNOSIS — F17200 Nicotine dependence, unspecified, uncomplicated: Secondary | ICD-10-CM | POA: Insufficient documentation

## 2012-02-07 DIAGNOSIS — B9689 Other specified bacterial agents as the cause of diseases classified elsewhere: Secondary | ICD-10-CM

## 2012-02-07 DIAGNOSIS — R109 Unspecified abdominal pain: Secondary | ICD-10-CM | POA: Insufficient documentation

## 2012-02-07 DIAGNOSIS — Z3202 Encounter for pregnancy test, result negative: Secondary | ICD-10-CM | POA: Insufficient documentation

## 2012-02-07 DIAGNOSIS — R42 Dizziness and giddiness: Secondary | ICD-10-CM | POA: Insufficient documentation

## 2012-02-07 LAB — URINALYSIS, ROUTINE W REFLEX MICROSCOPIC
Bilirubin Urine: NEGATIVE
Glucose, UA: NEGATIVE mg/dL
Ketones, ur: NEGATIVE mg/dL
Nitrite: NEGATIVE
Specific Gravity, Urine: 1.03 (ref 1.005–1.030)
pH: 6 (ref 5.0–8.0)

## 2012-02-07 LAB — CBC WITH DIFFERENTIAL/PLATELET
Eosinophils Absolute: 0.3 10*3/uL (ref 0.0–0.7)
HCT: 39.3 % (ref 36.0–46.0)
Lymphocytes Relative: 35 % (ref 12–46)
Lymphs Abs: 3.3 10*3/uL (ref 0.7–4.0)
MCH: 31.5 pg (ref 26.0–34.0)
Monocytes Relative: 9 % (ref 3–12)
Neutrophils Relative %: 52 % (ref 43–77)
WBC: 9.3 10*3/uL (ref 4.0–10.5)

## 2012-02-07 LAB — URINE MICROSCOPIC-ADD ON

## 2012-02-07 LAB — WET PREP, GENITAL: Trich, Wet Prep: NONE SEEN

## 2012-02-07 MED ORDER — METRONIDAZOLE 500 MG PO TABS: 500.0000 mg | ORAL_TABLET | Freq: Two times a day (BID) | ORAL | Status: DC

## 2012-02-07 NOTE — ED Notes (Signed)
Pt states she thinks she is having an issue like "BV" again. C/O vaginal d/c with a foul odor.

## 2012-02-07 NOTE — ED Notes (Signed)
PA at bedside.

## 2012-02-07 NOTE — ED Provider Notes (Signed)
History     CSN: [id number]  Arrival date & time 02/07/12  6962   First MD Initiated Contact with Patient 02/07/12 1951      Chief Complaint  Patient presents with  . Headache    (Consider location/radiation/quality/duration/timing/severity/associated sxs/prior treatment) Patient is a 32 y.o. female presenting with vaginal discharge. The history is provided by the patient. No language interpreter was used.  Vaginal Discharge This is a new problem. The current episode started 1 to 4 weeks ago. The problem occurs constantly. The problem has been gradually worsening. Pertinent negatives include no abdominal pain. Nothing aggravates the symptoms. She has tried nothing for the symptoms. The treatment provided no relief.    Past Medical History  Diagnosis Date  . No pertinent past medical history     Past Surgical History  Procedure Date  . Cesarean section   . Colposcopy Sep 2012    No family history on file.  History  Substance Use Topics  . Smoking status: Current Every Day Smoker -- 0.5 packs/day for 12 years    Types: Cigarettes  . Smokeless tobacco: Not on file  . Alcohol Use: Yes    OB History    Grav Para Term Preterm Abortions TAB SAB Ect Mult Living   1 1 1       1       Review of Systems  Gastrointestinal: Negative for abdominal pain.  Genitourinary: Positive for vaginal discharge.  All other systems reviewed and are negative.    Allergies  Penicillins  Home Medications   Current Outpatient Rx  Name  Route  Sig  Dispense  Refill  . CEPHALEXIN 500 MG PO CAPS   Oral   Take 2 capsules (1,000 mg total) by mouth 2 (two) times daily.   28 capsule   0   . SULFAMETHOXAZOLE-TRIMETHOPRIM 800-160 MG PO TABS   Oral   Take 2 tablets by mouth 2 (two) times daily.   28 tablet   0     BP 147/96  Pulse 68  Temp 98.1 F (36.7 C) (Oral)  Resp 16  Ht 5\' 6"  (1.676 m)  Wt 241 lb (109.317 kg)  BMI 38.90 kg/m2  SpO2 100%  Physical Exam  Nursing note  and vitals reviewed. Constitutional: She is oriented to person, place, and time. She appears well-developed and well-nourished.  HENT:  Head: Normocephalic.  Neck: Normal range of motion.  Cardiovascular: Normal rate and normal heart sounds.   Pulmonary/Chest: Effort normal.  Abdominal: Soft. Bowel sounds are normal.  Genitourinary: Vaginal discharge found.       Thick white vaginal discharge,  Adnexa nontender,  Cervix nontender  Musculoskeletal: Normal range of motion.  Neurological: She is alert and oriented to person, place, and time.  Skin: Skin is warm and dry.    ED Course  Procedures (including critical care time)  Labs Reviewed  URINALYSIS, ROUTINE W REFLEX MICROSCOPIC - Abnormal; Notable for the following:    APPearance CLOUDY (*)     Hgb urine dipstick TRACE (*)     Leukocytes, UA LARGE (*)     All other components within normal limits  URINE MICROSCOPIC-ADD ON - Abnormal; Notable for the following:    Squamous Epithelial / LPF MANY (*)     Bacteria, UA FEW (*)     All other components within normal limits  WET PREP, GENITAL - Abnormal; Notable for the following:    Clue Cells Wet Prep HPF POC MODERATE (*)  WBC, Wet Prep HPF POC MODERATE (*)     All other components within normal limits  PREGNANCY, URINE  URINE CULTURE  GC/CHLAMYDIA PROBE AMP   No results found.   No diagnosis found.    MDM      flagyl    Lonia Skinner [place], PA 02/07/12 2119  Lonia Skinner [place], [place] 02/07/12 2119

## 2012-02-07 NOTE — Discharge Instructions (Signed)
Bacterial Vaginosis Bacterial vaginosis (BV) is a vaginal infection where the normal balance of bacteria in the vagina is disrupted. The normal balance is then replaced by an overgrowth of certain bacteria. There are several different kinds of bacteria that can cause BV. BV is the most common vaginal infection in women of childbearing age. CAUSES   The cause of BV is not fully understood. BV develops when there is an increase or imbalance of harmful bacteria.  Some activities or behaviors can upset the normal balance of bacteria in the vagina and put women at increased risk including:  Having a new sex partner or multiple sex partners.  Douching.  Using an intrauterine device (IUD) for contraception.  It is not clear what role sexual activity plays in the development of BV. However, women that have never had sexual intercourse are rarely infected with BV. Women do not get BV from toilet seats, bedding, swimming pools or from touching objects around them.  SYMPTOMS   Grey vaginal discharge.  A fish-like odor with discharge, especially after sexual intercourse.  Itching or burning of the vagina and vulva.  Burning or pain with urination.  Some women have no signs or symptoms at all. DIAGNOSIS  Your caregiver must examine the vagina for signs of BV. Your caregiver will perform lab tests and look at the sample of vaginal fluid through a microscope. They will look for bacteria and abnormal cells (clue cells), a pH test higher than 4.5, and a positive amine test all associated with BV.  RISKS AND COMPLICATIONS   Pelvic inflammatory disease (PID).  Infections following gynecology surgery.  Developing HIV.  Developing herpes virus. TREATMENT  Sometimes BV will clear up without treatment. However, all women with symptoms of BV should be treated to avoid complications, especially if gynecology surgery is planned. Female partners generally do not need to be treated. However, BV may spread  between female sex partners so treatment is helpful in preventing a recurrence of BV.   BV may be treated with antibiotics. The antibiotics come in either pill or vaginal cream forms. Either can be used with nonpregnant or pregnant women, but the recommended dosages differ. These antibiotics are not harmful to the baby.  BV can recur after treatment. If this happens, a second round of antibiotics will often be prescribed.  Treatment is important for pregnant women. If not treated, BV can cause a premature delivery, especially for a pregnant woman who had a premature birth in the past. All pregnant women who have symptoms of BV should be checked and treated.  For chronic reoccurrence of BV, treatment with a type of prescribed gel vaginally twice a week is helpful. HOME CARE INSTRUCTIONS   Finish all medication as directed by your caregiver.  Do not have sex until treatment is completed.  Tell your sexual partner that you have a vaginal infection. They should see their caregiver and be treated if they have problems, such as a mild rash or itching.  Practice safe sex. Use condoms. Only have 1 sex partner. PREVENTION  Basic prevention steps can help reduce the risk of upsetting the natural balance of bacteria in the vagina and developing BV:  Do not have sexual intercourse (be abstinent).  Do not douche.  Use all of the medicine prescribed for treatment of BV, even if the signs and symptoms go away.  Tell your sex partner if you have BV. That way, they can be treated, if needed, to prevent reoccurrence. SEEK MEDICAL CARE IF:     Your symptoms are not improving after 3 days of treatment.  You have increased discharge, pain, or fever. MAKE SURE YOU:   Understand these instructions.  Will watch your condition.  Will get help right away if you are not doing well or get worse. FOR MORE INFORMATION  Division of STD Prevention (DSTDP), Centers for Disease Control and Prevention:  www.cdc.gov/std American [employer] Health Association (ASHA): www.ashastd.org  Document Released: 12/19/2004 Document Revised: 03/13/2011 Document Reviewed: 06/11/2008 ExitCare Patient Information 2013 [employer].  

## 2012-02-07 NOTE — ED Notes (Signed)
HA x 1 week-abd pain, dizziness and vaginal d/c x 1-2 weeks

## 2012-02-08 LAB — URINE CULTURE

## 2012-02-08 LAB — GC/CHLAMYDIA PROBE AMP: GC Probe RNA: NEGATIVE

## 2012-02-10 NOTE — ED Provider Notes (Signed)
Medical screening examination/treatment/procedure(s) were performed by non-physician practitioner and as supervising physician I was immediately available for consultation/collaboration.  Tytionna Cloyd T Shivali Quackenbush, MD 02/10/12 1525 

## 2012-07-12 ENCOUNTER — Encounter (HOSPITAL_BASED_OUTPATIENT_CLINIC_OR_DEPARTMENT_OTHER): Payer: Self-pay | Admitting: Emergency Medicine

## 2012-07-12 ENCOUNTER — Emergency Department (HOSPITAL_BASED_OUTPATIENT_CLINIC_OR_DEPARTMENT_OTHER): Payer: Self-pay

## 2012-07-12 ENCOUNTER — Emergency Department (HOSPITAL_BASED_OUTPATIENT_CLINIC_OR_DEPARTMENT_OTHER)
Admission: EM | Admit: 2012-07-12 | Discharge: 2012-07-13 | Disposition: A | Discharge status: Home / Self Care | Payer: Self-pay | Attending: Emergency Medicine | Admitting: Emergency Medicine

## 2012-07-12 DIAGNOSIS — Z3202 Encounter for pregnancy test, result negative: Secondary | ICD-10-CM | POA: Insufficient documentation

## 2012-07-12 DIAGNOSIS — K5732 Diverticulitis of large intestine without perforation or abscess without bleeding: Secondary | ICD-10-CM | POA: Insufficient documentation

## 2012-07-12 DIAGNOSIS — R109 Unspecified abdominal pain: Secondary | ICD-10-CM | POA: Insufficient documentation

## 2012-07-12 DIAGNOSIS — Z9889 Other specified postprocedural states: Secondary | ICD-10-CM | POA: Insufficient documentation

## 2012-07-12 DIAGNOSIS — F17200 Nicotine dependence, unspecified, uncomplicated: Secondary | ICD-10-CM | POA: Insufficient documentation

## 2012-07-12 DIAGNOSIS — Z880 Allergy status to penicillin: Secondary | ICD-10-CM | POA: Insufficient documentation

## 2012-07-12 LAB — CBC WITH DIFFERENTIAL/PLATELET
Basophils Absolute: 0 10*3/uL (ref 0.0–0.1)
Eosinophils Relative: 2 % (ref 0–5)
Lymphocytes Relative: 34 % (ref 12–46)
MCV: 92.2 fL (ref 78.0–100.0)
Neutrophils Relative %: 53 % (ref 43–77)
Platelets: 268 10*3/uL (ref 150–400)
RDW: 13.9 % (ref 11.5–15.5)
WBC: 11 10*3/uL — ABNORMAL HIGH (ref 4.0–10.5)

## 2012-07-12 LAB — URINALYSIS, ROUTINE W REFLEX MICROSCOPIC
Glucose, UA: NEGATIVE mg/dL
Protein, ur: NEGATIVE mg/dL
Specific Gravity, Urine: 1.031 — ABNORMAL HIGH (ref 1.005–1.030)
pH: 6 (ref 5.0–8.0)

## 2012-07-12 LAB — URINE MICROSCOPIC-ADD ON

## 2012-07-12 LAB — PREGNANCY, URINE: Preg Test, Ur: NEGATIVE

## 2012-07-12 LAB — WET PREP, GENITAL
Trich, Wet Prep: NONE SEEN
Yeast Wet Prep HPF POC: NONE SEEN

## 2012-07-12 MED ORDER — IOHEXOL 300 MG/ML  SOLN
50.0000 mL | Freq: Once | INTRAMUSCULAR | Status: AC | PRN
  Administered 2012-07-12: 50 mL via ORAL

## 2012-07-12 MED ORDER — SODIUM CHLORIDE 0.9 % IV SOLN
INTRAVENOUS | Status: DC
  Administered 2012-07-12: via INTRAVENOUS

## 2012-07-12 NOTE — ED Notes (Signed)
MD at bedside. 

## 2012-07-12 NOTE — ED Notes (Signed)
Pt c/o lower back pain that radiates to right flank x 1 week, today pain radiated to front of abdomen

## 2012-07-12 NOTE — ED Provider Notes (Signed)
History    CSN: [id number] Arrival date & time 07/12/12  2055  First MD Initiated Contact with Patient 07/12/12 2251     Chief Complaint  Patient presents with  . Back Pain   (Consider location/radiation/quality/duration/timing/severity/associated sxs/prior Treatment) HPI This is a 32 year old female with a 1-3 week history (she is unsure) of back pain. The pain is vaguely described and a vaguely located. It has been located in the "entire back", the right flank, the right shoulder, the mid lower back, and is now settled in the left lower quadrant of the abdomen. She told her nurse the pain was in her epigastrium. The pain is moderate in severity. It is worse with movement. It has not been relieved by over-the-counter analgesics. There has been no associated fever, chills, nausea, vomiting diarrhea, dysuria, hematuria, vaginal bleeding or vaginal discharge.  Past Medical History  Diagnosis Date  . No pertinent past medical history    Past Surgical History  Procedure Laterality Date  . Cesarean section    . Colposcopy  Sep 2012   History reviewed. No pertinent family history. History  Substance Use Topics  . Smoking status: Current Every Day Smoker -- 0.50 packs/day for 12 years    Types: Cigarettes  . Smokeless tobacco: Not on file  . Alcohol Use: Yes   OB History   Grav Para Term Preterm Abortions TAB SAB Ect Mult Living   1 1 1       1      Review of Systems  All other systems reviewed and are negative.    Allergies  Penicillins  Home Medications   Current Outpatient Rx  Name  Route  Sig  Dispense  Refill  . cephALEXin (KEFLEX) 500 MG capsule   Oral   Take 2 capsules (1,000 mg total) by mouth 2 (two) times daily.   28 capsule   0   . metroNIDAZOLE (FLAGYL) 500 MG tablet   Oral   Take 1 tablet (500 mg total) by mouth 2 (two) times daily.   14 tablet   1   . sulfamethoxazole-trimethoprim (BACTRIM DS) 800-160 MG per tablet   Oral   Take 2 tablets by  mouth 2 (two) times daily.   28 tablet   0    BP 155/93  Pulse 64  Temp(Src) 99.2 F (37.3 C) (Oral)  Resp 16  Ht 5' 6.5" (1.689 m)  Wt 243 lb (110.224 kg)  BMI 38.64 kg/m2  SpO2 100%  LMP 07/05/2012  Physical Exam General: Well-developed, well-nourished female in no acute distress; appearance consistent with age of record HENT: normocephalic, atraumatic Eyes: pupils equal round and reactive to light; extraocular muscles intact Neck: supple Heart: regular rate and rhythm; no murmurs, rubs or gallops Lungs: clear to auscultation bilaterally Abdomen: soft; nondistended; left lower quadrant tenderness; no masses or hepatosplenomegaly; bowel sounds present GU: No CVA tenderness; normal external genitalia; no vaginal bleeding; physiologic appearing vaginal discharge; no cervical motion tenderness; no adnexal tenderness Extremities: No deformity; full range of motion Neurologic: Awake, alert and oriented; motor function intact in all extremities and symmetric; no facial droop Skin: Warm and dry Psychiatric: Normal mood and affect    ED Course  Procedures (including critical care time)    MDM   Nursing notes and vitals signs, including pulse oximetry, reviewed.  Summary of this visit's results, reviewed by myself:  Labs:  Results for orders placed during the hospital encounter of 07/12/12 (from the past 24 hour(s))  URINALYSIS, ROUTINE W REFLEX  MICROSCOPIC     Status: Abnormal   Collection Time    07/12/12  9:09 PM      Result Value Range   Color, Urine YELLOW  YELLOW   APPearance CLEAR  CLEAR   Specific Gravity, Urine 1.031 (*) 1.005 - 1.030   pH 6.0  5.0 - 8.0   Glucose, UA NEGATIVE  NEGATIVE mg/dL   Hgb urine dipstick SMALL (*) NEGATIVE   Bilirubin Urine NEGATIVE  NEGATIVE   Ketones, ur NEGATIVE  NEGATIVE mg/dL   Protein, ur NEGATIVE  NEGATIVE mg/dL   Urobilinogen, UA 1.0  0.0 - 1.0 mg/dL   Nitrite NEGATIVE  NEGATIVE   Leukocytes, UA NEGATIVE  NEGATIVE   PREGNANCY, URINE     Status: None   Collection Time    07/12/12  9:09 PM      Result Value Range   Preg Test, Ur NEGATIVE  NEGATIVE  URINE MICROSCOPIC-ADD ON     Status: None   Collection Time    07/12/12  9:09 PM      Result Value Range   Squamous Epithelial / LPF RARE  RARE   RBC / HPF 3-6  <3 RBC/hpf   Bacteria, UA RARE  RARE  CBC WITH DIFFERENTIAL     Status: Abnormal   Collection Time    07/12/12 11:30 PM      Result Value Range   WBC 11.0 (*) 4.0 - 10.5 K/uL   RBC 4.35  3.87 - 5.11 MIL/uL   Hemoglobin 13.7  12.0 - 15.0 g/dL   HCT 16.1  09.6 - 04.5 %   MCV 92.2  78.0 - 100.0 fL   MCH 31.5  26.0 - 34.0 pg   MCHC 34.2  30.0 - 36.0 g/dL   RDW 40.9  81.1 - 91.4 %   Platelets 268  150 - 400 K/uL   Neutrophils Relative % 53  43 - 77 %   Neutro Abs 5.9  1.7 - 7.7 K/uL   Lymphocytes Relative 34  12 - 46 %   Lymphs Abs 3.8  0.7 - 4.0 K/uL   Monocytes Relative 10  3 - 12 %   Monocytes Absolute 1.1 (*) 0.1 - 1.0 K/uL   Eosinophils Relative 2  0 - 5 %   Eosinophils Absolute 0.2  0.0 - 0.7 K/uL   Basophils Relative 0  0 - 1 %   Basophils Absolute 0.0  0.0 - 0.1 K/uL  BASIC METABOLIC PANEL     Status: Abnormal   Collection Time    07/12/12 11:30 PM      Result Value Range   Sodium 139  135 - 145 mEq/L   Potassium 3.4 (*) 3.5 - 5.1 mEq/L   Chloride 103  96 - 112 mEq/L   CO2 25  19 - 32 mEq/L   Glucose, Bld 102 (*) 70 - 99 mg/dL   BUN 13  6 - 23 mg/dL   Creatinine, Ser 7.82  0.50 - 1.10 mg/dL   Calcium 9.7  8.4 - 95.6 mg/dL   GFR calc non Af Amer >90  >90 mL/min   GFR calc Af Amer >90  >90 mL/min  WET PREP, GENITAL     Status: Abnormal   Collection Time    07/12/12 11:43 PM      Result Value Range   Yeast Wet Prep HPF POC NONE SEEN  NONE SEEN   Trich, Wet Prep NONE SEEN  NONE SEEN   Clue Cells Wet Prep HPF  POC FEW (*) NONE SEEN   WBC, Wet Prep HPF POC FEW (*) NONE SEEN    Imaging Studies: Ct Abdomen Pelvis W Contrast  07/13/2012   *RADIOLOGY REPORT*  Clinical Data:  Left lower quadrant pain and low back pain radiating to the right flank for 1 week.  CT ABDOMEN AND PELVIS WITH CONTRAST  Technique:  Multidetector CT imaging of the abdomen and pelvis was performed following the standard protocol during bolus administration of intravenous contrast.  Contrast:  100 ml Omnipaque 300  Comparison: None.  Findings: The lung bases are clear.  The liver, spleen, gallbladder, pancreas, adrenal glands, kidneys, inferior vena cava, abdominal aorta, and retroperitoneal lymph nodes are unremarkable.  The stomach and small bowel are not abnormally distended.  Stool filled colon without distension.  No free air or free fluid in the abdomen.  There is a tiny periumbilical hernia containing fat.  Pelvis:  There is diverticulosis of the sigmoid colon.  There is focal thickening of the wall of the colon with pericolonic infiltration at the junction of the descending and sigmoid region. This is consistent with focal diverticulitis.  No evidence of abscess.  The uterus and ovaries are not enlarged.  The bladder wall is not thickened.  No free or loculated pelvic fluid collections.  The appendix is normal.  Normal alignment of the lumbar vertebrae.  IMPRESSION: Focal inflammatory changes in the junction of the sigmoid and descending colon consistent with diverticulitis.  No abscess.   Original Report Authenticated By: Burman Nieves, M.D.      Hanley Seamen, MD 07/13/12 [phone]

## 2012-07-13 LAB — BASIC METABOLIC PANEL
CO2: 25 mEq/L (ref 19–32)
Calcium: 9.7 mg/dL (ref 8.4–10.5)
GFR calc non Af Amer: >90 mL/min (ref >90)
Sodium: 139 mEq/L (ref 135–145)

## 2012-07-13 MED ORDER — FENTANYL CITRATE 0.05 MG/ML IJ SOLN
50.0000 ug | Freq: Once | INTRAMUSCULAR | Status: AC
  Administered 2012-07-13: 50 ug via INTRAVENOUS
  Filled 2012-07-13: qty 2

## 2012-07-13 MED ORDER — OXYCODONE-ACETAMINOPHEN 5-325 MG PO TABS
1.0000 | ORAL_TABLET | Freq: Once | ORAL | Status: AC
  Administered 2012-07-13: 1 via ORAL
  Filled 2012-07-13 (×2): qty 1

## 2012-07-13 MED ORDER — CIPROFLOXACIN HCL 500 MG PO TABS
500.0000 mg | ORAL_TABLET | Freq: Once | ORAL | Status: AC
  Administered 2012-07-13: 500 mg via ORAL
  Filled 2012-07-13: qty 1

## 2012-07-13 MED ORDER — CIPROFLOXACIN HCL 500 MG PO TABS: 500.0000 mg | ORAL_TABLET | Freq: Two times a day (BID) | ORAL | Status: DC

## 2012-07-13 MED ORDER — HYDROCODONE-ACETAMINOPHEN 5-325 MG PO TABS
1.0000 | ORAL_TABLET | Freq: Once | ORAL | Status: DC
  Filled 2012-07-13: qty 1

## 2012-07-13 MED ORDER — IOHEXOL 300 MG/ML  SOLN
100.0000 mL | Freq: Once | INTRAMUSCULAR | Status: AC | PRN
  Administered 2012-07-13: 100 mL via INTRAVENOUS

## 2012-07-13 MED ORDER — OXYCODONE-ACETAMINOPHEN 5-325 MG PO TABS: 1.0000 | ORAL_TABLET | Freq: Four times a day (QID) | ORAL | Status: DC | PRN

## 2012-07-13 MED ORDER — METRONIDAZOLE 500 MG PO TABS: 500.0000 mg | ORAL_TABLET | Freq: Three times a day (TID) | ORAL | Status: DC

## 2012-07-13 MED ORDER — METRONIDAZOLE 500 MG PO TABS
500.0000 mg | ORAL_TABLET | Freq: Once | ORAL | Status: AC
  Administered 2012-07-13: 500 mg via ORAL
  Filled 2012-07-13: qty 1

## 2012-07-13 MED ORDER — HYDROCODONE-ACETAMINOPHEN 5-325 MG PO TABS: 1.0000 | ORAL_TABLET | Freq: Four times a day (QID) | ORAL | Status: DC | PRN

## 2012-07-14 LAB — GC/CHLAMYDIA PROBE AMP: CT Probe RNA: NEGATIVE

## 2013-01-17 ENCOUNTER — Ambulatory Visit: Payer: Self-pay | Admitting: Advanced Practice Midwife

## 2013-01-31 ENCOUNTER — Ambulatory Visit: Payer: No Typology Code available for payment source | Admitting: Advanced Practice Midwife

## 2013-01-31 ENCOUNTER — Ambulatory Visit: Payer: Self-pay | Admitting: Advanced Practice Midwife

## 2013-02-11 ENCOUNTER — Ambulatory Visit: Payer: 59 | Admitting: Advanced Practice Midwife

## 2013-02-28 ENCOUNTER — Ambulatory Visit: Payer: 59 | Admitting: Obstetrics & Gynecology

## 2013-03-20 ENCOUNTER — Ambulatory Visit: Payer: 59 | Admitting: Advanced Practice Midwife

## 2013-04-11 ENCOUNTER — Ambulatory Visit: Payer: 59 | Admitting: Advanced Practice Midwife

## 2013-09-08 ENCOUNTER — Emergency Department (HOSPITAL_COMMUNITY)
Admission: EM | Admit: 2013-09-08 | Discharge: 2013-09-08 | Disposition: A | Discharge status: Home / Self Care | Payer: 59 | Attending: Emergency Medicine | Admitting: Emergency Medicine

## 2013-09-08 ENCOUNTER — Encounter (HOSPITAL_COMMUNITY): Payer: Self-pay | Admitting: Emergency Medicine

## 2013-09-08 DIAGNOSIS — Z792 Long term (current) use of antibiotics: Secondary | ICD-10-CM | POA: Insufficient documentation

## 2013-09-08 DIAGNOSIS — F17200 Nicotine dependence, unspecified, uncomplicated: Secondary | ICD-10-CM | POA: Insufficient documentation

## 2013-09-08 DIAGNOSIS — L299 Pruritus, unspecified: Secondary | ICD-10-CM | POA: Insufficient documentation

## 2013-09-08 DIAGNOSIS — Z79899 Other long term (current) drug therapy: Secondary | ICD-10-CM | POA: Insufficient documentation

## 2013-09-08 DIAGNOSIS — Z880 Allergy status to penicillin: Secondary | ICD-10-CM | POA: Diagnosis not present

## 2013-09-08 MED ORDER — HYDROXYZINE HCL 10 MG PO TABS: 10.0000 mg | ORAL_TABLET | Freq: Three times a day (TID) | ORAL | Status: DC | PRN

## 2013-09-08 NOTE — ED Notes (Signed)
Patient states overall itching.   No rash seen.   Patient states "it's everywhere".

## 2013-09-08 NOTE — Discharge Instructions (Signed)
Pruritus  °Pruritus is an itch. There are many different problems that can cause an itch. Dry skin is one of the most common causes of itching. Most cases of itching do not require medical attention.  °HOME CARE INSTRUCTIONS  °Make sure your skin is moistened on a regular basis. A moisturizer that contains petroleum jelly is best for keeping moisture in your skin. If you develop a rash, you may try the following for relief:  °· Use corticosteroid cream. °· Apply cool compresses to the affected areas. °· Bathe with Epsom salts or baking soda in the bathwater. °· Soak in colloidal oatmeal baths. These are available at your pharmacy. °· Apply baking soda paste to the rash. Stir water into baking soda until it reaches a paste-like consistency. °· Use an anti-itch lotion. °· Take over-the-counter diphenhydramine medicine by mouth as the instructions direct. °· Avoid scratching. Scratching may cause the rash to become infected. If itching is very bad, your caregiver may suggest prescription lotions or creams to lessen your symptoms. °· Avoid hot showers, which can make itching worse. A cold shower may help with itching as long as you use a moisturizer after the shower. °SEEK MEDICAL CARE IF: °The itching does not go away after several days. °Document Released: 08/31/2010 Document Revised: 05/05/2013 Document Reviewed: 08/31/2010 °ExitCare® Patient Information ©2015 [employer]. This information is not intended to replace advice given to you by your health care provider. Make sure you discuss any questions you have with your health care provider. ° °

## 2013-09-08 NOTE — ED Provider Notes (Signed)
CSN: [id number]     Arrival date & time 09/08/13  1114 History  This chart was scribed for non-physician practitioner Montine Circle, PA-C working with Johnna Acosta, MD by Ludger Nutting, ED Scribe. This patient was seen in room TR10C/TR10C and the patient's care was started at 11:55 AM.    Chief Complaint  Patient presents with  . Pruritis    The history is provided by the patient. No language interpreter was used.    HPI Comments: Tina Riggs is a 33 y.o. female who presents to the Emergency Department complaining of 1 week of gradual onset, constant, gradually worsened itching to the entire body including all extremities, torso, groin, buttocks, neck, scalp. She denies change in detergents or body care products. She reports moving in with her sister a few days ago. She has taken benadryl without relief.   Past Medical History  Diagnosis Date  . No pertinent past medical history    Past Surgical History  Procedure Laterality Date  . Cesarean section    . Colposcopy  Sep 2012   History reviewed. No pertinent family history. History  Substance Use Topics  . Smoking status: Current Every Day Smoker -- 0.50 packs/day for 12 years    Types: Cigarettes  . Smokeless tobacco: Not on file  . Alcohol Use: Yes   OB History   Grav Para Term Preterm Abortions TAB SAB Ect Mult Living   1 1 1       1      Review of Systems  Constitutional: Negative for fever and chills.  Respiratory: Negative for shortness of breath.   Cardiovascular: Negative for chest pain.  Gastrointestinal: Negative for nausea, vomiting, diarrhea and constipation.  Genitourinary: Negative for dysuria.  Skin: Positive for rash.      Allergies  Penicillins  Home Medications   Prior to Admission medications   Medication Sig Start Date End Date Taking? Authorizing Provider  cephALEXin (KEFLEX) 500 MG capsule Take 2 capsules (1,000 mg total) by mouth 2 (two) times daily. 09/18/11   Nicole Pisciotta, PA-C   ciprofloxacin (CIPRO) 500 MG tablet Take 1 tablet (500 mg total) by mouth every 12 (twelve) hours. 07/13/12   John L Molpus, MD  metroNIDAZOLE (FLAGYL) 500 MG tablet Take 1 tablet (500 mg total) by mouth 2 (two) times daily. 02/07/12   Fransico Meadow, PA-C  metroNIDAZOLE (FLAGYL) 500 MG tablet Take 1 tablet (500 mg total) by mouth 3 (three) times daily. 07/13/12   Karen Chafe Molpus, MD  oxyCODONE-acetaminophen (PERCOCET/ROXICET) 5-325 MG per tablet Take 1-2 tablets by mouth every 6 (six) hours as needed for pain. 07/13/12   John L Molpus, MD  sulfamethoxazole-trimethoprim (BACTRIM DS) 800-160 MG per tablet Take 2 tablets by mouth 2 (two) times daily. 09/18/11   Nicole Pisciotta, PA-C   BP 140/99  Pulse 96  Temp(Src) 98.6 F (37 C)  Resp 18  Wt 244 lb (110.678 kg)  SpO2 98% Physical Exam  Nursing note and vitals reviewed. Constitutional: She is oriented to person, place, and time. She appears well-developed and well-nourished.  HENT:  Head: Normocephalic and atraumatic.  Cardiovascular: Normal rate.   Pulmonary/Chest: Effort normal.  Abdominal: She exhibits no distension.  Neurological: She is alert and oriented to person, place, and time.  Skin: Skin is warm and dry. No rash noted. No erythema.  No obvious rash, bites, or cellulitis.   Psychiatric: She has a normal mood and affect.    ED Course  Procedures (including critical  care time)  DIAGNOSTIC STUDIES: Oxygen Saturation is 98% on RA, normal by my interpretation.    COORDINATION OF CARE: 11:57 AM Discussed treatment plan with pt at bedside and pt agreed to plan.   Labs Review Labs Reviewed - No data to display  Imaging Review No results found.   EKG Interpretation None      MDM   Final diagnoses:  Pruritus    Patient with generalized pruritus.  No rash.  No fever.  No bug bites.  Will treat with atarax.  Recommend switching soaps and detergents and following up with dermatology.  I personally performed the services  described in this documentation, which was scribed in my presence. The recorded information has been reviewed and is accurate.    Montine Circle, PA-C 09/08/13 [phone]

## 2013-09-08 NOTE — ED Notes (Signed)
Per pt sts she has been itching all over. sts taking benadryl without relief. sts for a week.

## 2013-09-10 NOTE — ED Provider Notes (Signed)
Medical screening examination/treatment/procedure(s) were performed by non-physician practitioner and as supervising physician I was immediately available for consultation/collaboration.    Johnna Acosta, MD 09/10/13 [phone]

## 2013-09-30 ENCOUNTER — Telehealth (HOSPITAL_BASED_OUTPATIENT_CLINIC_OR_DEPARTMENT_OTHER): Payer: Self-pay

## 2013-09-30 NOTE — Telephone Encounter (Signed)
Pt calling stating she didn't get Rx (Atarax) filled from her visit from 09/08/13 and now she cant find it and wants to know if provider will rewrite.  FM explained to pt shed need to be reseen or f/u at PCP or [facility].

## 2013-11-03 ENCOUNTER — Encounter (HOSPITAL_COMMUNITY): Payer: Self-pay | Admitting: Emergency Medicine

## 2013-12-29 ENCOUNTER — Encounter: Payer: Self-pay | Admitting: *Deleted

## 2014-04-13 ENCOUNTER — Encounter (HOSPITAL_COMMUNITY): Payer: Self-pay | Admitting: Family Medicine

## 2014-04-13 ENCOUNTER — Emergency Department (HOSPITAL_COMMUNITY): Payer: 59

## 2014-04-13 ENCOUNTER — Inpatient Hospital Stay (HOSPITAL_COMMUNITY)
Admission: EM | Admit: 2014-04-13 | Discharge: 2014-04-20 | DRG: 872 | Disposition: A | Discharge status: Home / Self Care | Payer: 59 | Attending: Oncology | Admitting: Oncology

## 2014-04-13 DIAGNOSIS — A749 Chlamydial infection, unspecified: Secondary | ICD-10-CM | POA: Diagnosis present

## 2014-04-13 DIAGNOSIS — E876 Hypokalemia: Secondary | ICD-10-CM | POA: Diagnosis not present

## 2014-04-13 DIAGNOSIS — K5720 Diverticulitis of large intestine with perforation and abscess without bleeding: Secondary | ICD-10-CM | POA: Diagnosis present

## 2014-04-13 DIAGNOSIS — Z6838 Body mass index (BMI) 38.0-38.9, adult: Secondary | ICD-10-CM | POA: Diagnosis not present

## 2014-04-13 DIAGNOSIS — E669 Obesity, unspecified: Secondary | ICD-10-CM | POA: Diagnosis present

## 2014-04-13 DIAGNOSIS — D62 Acute posthemorrhagic anemia: Secondary | ICD-10-CM | POA: Diagnosis present

## 2014-04-13 DIAGNOSIS — R109 Unspecified abdominal pain: Secondary | ICD-10-CM | POA: Diagnosis not present

## 2014-04-13 DIAGNOSIS — Z720 Tobacco use: Secondary | ICD-10-CM | POA: Diagnosis present

## 2014-04-13 DIAGNOSIS — F1010 Alcohol abuse, uncomplicated: Secondary | ICD-10-CM | POA: Diagnosis present

## 2014-04-13 DIAGNOSIS — A5602 Chlamydial vulvovaginitis: Secondary | ICD-10-CM | POA: Diagnosis present

## 2014-04-13 DIAGNOSIS — K5792 Diverticulitis of intestine, part unspecified, without perforation or abscess without bleeding: Secondary | ICD-10-CM | POA: Diagnosis present

## 2014-04-13 DIAGNOSIS — Z789 Other specified health status: Secondary | ICD-10-CM | POA: Diagnosis present

## 2014-04-13 DIAGNOSIS — F1090 Alcohol use, unspecified, uncomplicated: Secondary | ICD-10-CM | POA: Diagnosis present

## 2014-04-13 DIAGNOSIS — F17210 Nicotine dependence, cigarettes, uncomplicated: Secondary | ICD-10-CM | POA: Diagnosis present

## 2014-04-13 DIAGNOSIS — N8320 Unspecified ovarian cysts: Secondary | ICD-10-CM | POA: Diagnosis present

## 2014-04-13 DIAGNOSIS — B9689 Other specified bacterial agents as the cause of diseases classified elsewhere: Secondary | ICD-10-CM | POA: Diagnosis not present

## 2014-04-13 DIAGNOSIS — N760 Acute vaginitis: Secondary | ICD-10-CM | POA: Diagnosis present

## 2014-04-13 DIAGNOSIS — A419 Sepsis, unspecified organism: Secondary | ICD-10-CM | POA: Diagnosis not present

## 2014-04-13 DIAGNOSIS — K5732 Diverticulitis of large intestine without perforation or abscess without bleeding: Secondary | ICD-10-CM

## 2014-04-13 DIAGNOSIS — K5780 Diverticulitis of intestine, part unspecified, with perforation and abscess without bleeding: Secondary | ICD-10-CM

## 2014-04-13 DIAGNOSIS — Z7289 Other problems related to lifestyle: Secondary | ICD-10-CM | POA: Diagnosis present

## 2014-04-13 DIAGNOSIS — A5901 Trichomonal vulvovaginitis: Secondary | ICD-10-CM | POA: Diagnosis present

## 2014-04-13 DIAGNOSIS — Z8719 Personal history of other diseases of the digestive system: Secondary | ICD-10-CM | POA: Diagnosis not present

## 2014-04-13 HISTORY — DX: Papillomavirus as the cause of diseases classified elsewhere: B97.7

## 2014-04-13 HISTORY — DX: Diverticulitis of intestine, part unspecified, without perforation or abscess without bleeding: K57.92

## 2014-04-13 LAB — BASIC METABOLIC PANEL
ANION GAP: 12 (ref 5–15)
BUN: <5 mg/dL — ABNORMAL LOW (ref 6–23)
CO2: 20 mmol/L (ref 19–32)
CREATININE: 0.83 mg/dL (ref 0.50–1.10)
Calcium: 8 mg/dL — ABNORMAL LOW (ref 8.4–10.5)
Chloride: 107 mmol/L (ref 96–112)
GFR calc Af Amer: >90 mL/min (ref >90)
Glucose, Bld: 84 mg/dL (ref 70–99)
Potassium: 3.6 mmol/L (ref 3.5–5.1)
SODIUM: 139 mmol/L (ref 135–145)

## 2014-04-13 LAB — WET PREP, GENITAL: Yeast Wet Prep HPF POC: NONE SEEN

## 2014-04-13 LAB — CBC WITH DIFFERENTIAL/PLATELET
BASOS ABS: 0 10*3/uL (ref 0.0–0.1)
Basophils Relative: 0 % (ref 0–1)
EOS ABS: 0 10*3/uL (ref 0.0–0.7)
EOS PCT: 0 % (ref 0–5)
HCT: 41.9 % (ref 36.0–46.0)
Hemoglobin: 13.7 g/dL (ref 12.0–15.0)
LYMPHS PCT: 18 % (ref 12–46)
Lymphs Abs: 2.3 10*3/uL (ref 0.7–4.0)
MCH: 30.9 pg (ref 26.0–34.0)
MCHC: 32.7 g/dL (ref 30.0–36.0)
MCV: 94.6 fL (ref 78.0–100.0)
Monocytes Absolute: 0.8 10*3/uL (ref 0.1–1.0)
Monocytes Relative: 7 % (ref 3–12)
NEUTROS PCT: 75 % (ref 43–77)
Neutro Abs: 9.5 10*3/uL — ABNORMAL HIGH (ref 1.7–7.7)
PLATELETS: 314 10*3/uL (ref 150–400)
RBC: 4.43 MIL/uL (ref 3.87–5.11)
RDW: 14.1 % (ref 11.5–15.5)
WBC: 12.7 10*3/uL — AB (ref 4.0–10.5)

## 2014-04-13 LAB — COMPREHENSIVE METABOLIC PANEL
ALK PHOS: 90 U/L (ref 39–117)
ALT: 15 U/L (ref 0–35)
ANION GAP: 10 (ref 5–15)
AST: 24 U/L (ref 0–37)
Albumin: 3.5 g/dL (ref 3.5–5.2)
BILIRUBIN TOTAL: 0.6 mg/dL (ref 0.3–1.2)
BUN: <5 mg/dL — ABNORMAL LOW (ref 6–23)
CALCIUM: 8.4 mg/dL (ref 8.4–10.5)
CHLORIDE: 108 mmol/L (ref 96–112)
CO2: 20 mmol/L (ref 19–32)
Creatinine, Ser: 0.86 mg/dL (ref 0.50–1.10)
GFR calc Af Amer: >90 mL/min (ref >90)
GFR, EST NON AFRICAN AMERICAN: 89 mL/min — AB (ref >90)
GLUCOSE: 106 mg/dL — AB (ref 70–99)
Potassium: 4 mmol/L (ref 3.5–5.1)
SODIUM: 138 mmol/L (ref 135–145)
Total Protein: 6.4 g/dL (ref 6.0–8.3)

## 2014-04-13 LAB — URINALYSIS, ROUTINE W REFLEX MICROSCOPIC
Bilirubin Urine: NEGATIVE
Glucose, UA: NEGATIVE mg/dL
Ketones, ur: NEGATIVE mg/dL
LEUKOCYTES UA: NEGATIVE
Nitrite: NEGATIVE
PROTEIN: NEGATIVE mg/dL
SPECIFIC GRAVITY, URINE: 1.021 (ref 1.005–1.030)
UROBILINOGEN UA: 1 mg/dL (ref 0.0–1.0)
pH: 6 (ref 5.0–8.0)

## 2014-04-13 LAB — URINE MICROSCOPIC-ADD ON

## 2014-04-13 LAB — LIPASE, BLOOD: LIPASE: 15 U/L (ref 11–59)

## 2014-04-13 LAB — POC URINE PREG, ED: PREG TEST UR: NEGATIVE

## 2014-04-13 MED ORDER — CEFTRIAXONE SODIUM IN DEXTROSE 20 MG/ML IV SOLN
1.0000 g | INTRAVENOUS | Status: DC
  Administered 2014-04-13 – 2014-04-14 (×2): 1 g via INTRAVENOUS
  Filled 2014-04-13 (×3): qty 50

## 2014-04-13 MED ORDER — METRONIDAZOLE IN NACL 5-0.79 MG/ML-% IV SOLN
500.0000 mg | Freq: Three times a day (TID) | INTRAVENOUS | Status: DC
  Administered 2014-04-13 – 2014-04-15 (×5): 500 mg via INTRAVENOUS
  Filled 2014-04-13 (×8): qty 100

## 2014-04-13 MED ORDER — VITAMIN B-1 100 MG PO TABS
100.0000 mg | ORAL_TABLET | Freq: Every day | ORAL | Status: DC
  Administered 2014-04-13 – 2014-04-20 (×8): 100 mg via ORAL
  Filled 2014-04-13 (×8): qty 1

## 2014-04-13 MED ORDER — METRONIDAZOLE IN NACL 5-0.79 MG/ML-% IV SOLN
500.0000 mg | Freq: Once | INTRAVENOUS | Status: AC
  Administered 2014-04-13: 500 mg via INTRAVENOUS
  Filled 2014-04-13: qty 100

## 2014-04-13 MED ORDER — FENTANYL CITRATE 0.05 MG/ML IJ SOLN
50.0000 ug | Freq: Once | INTRAMUSCULAR | Status: AC
  Administered 2014-04-13: 50 ug via INTRAVENOUS
  Filled 2014-04-13: qty 2

## 2014-04-13 MED ORDER — SODIUM CHLORIDE 0.9 % IV SOLN
INTRAVENOUS | Status: DC
  Administered 2014-04-13: 15:00:00 via INTRAVENOUS

## 2014-04-13 MED ORDER — LORAZEPAM 2 MG/ML IJ SOLN
1.0000 mg | Freq: Four times a day (QID) | INTRAMUSCULAR | Status: AC | PRN
  Administered 2014-04-14 – 2014-04-15 (×2): 1 mg via INTRAVENOUS
  Filled 2014-04-13 (×3): qty 1

## 2014-04-13 MED ORDER — ONDANSETRON HCL 4 MG/2ML IJ SOLN
4.0000 mg | Freq: Once | INTRAMUSCULAR | Status: DC
  Filled 2014-04-13: qty 2

## 2014-04-13 MED ORDER — ENOXAPARIN SODIUM 40 MG/0.4ML ~~LOC~~ SOLN
40.0000 mg | SUBCUTANEOUS | Status: DC
  Administered 2014-04-13 – 2014-04-17 (×4): 40 mg via SUBCUTANEOUS
  Filled 2014-04-13 (×6): qty 0.4

## 2014-04-13 MED ORDER — MORPHINE SULFATE 2 MG/ML IJ SOLN: 2.0000 mg | INTRAMUSCULAR | Status: DC | PRN

## 2014-04-13 MED ORDER — FENTANYL CITRATE 0.05 MG/ML IJ SOLN
50.0000 ug | INTRAMUSCULAR | Status: DC | PRN
  Administered 2014-04-13 – 2014-04-14 (×7): 50 ug via INTRAVENOUS
  Filled 2014-04-13 (×7): qty 2

## 2014-04-13 MED ORDER — ONDANSETRON HCL 4 MG/2ML IJ SOLN
4.0000 mg | Freq: Once | INTRAMUSCULAR | Status: AC
  Administered 2014-04-13: 4 mg via INTRAVENOUS

## 2014-04-13 MED ORDER — IOHEXOL 300 MG/ML  SOLN
25.0000 mL | Freq: Once | INTRAMUSCULAR | Status: AC | PRN
  Administered 2014-04-13: 25 mL via ORAL

## 2014-04-13 MED ORDER — IOHEXOL 300 MG/ML  SOLN
100.0000 mL | Freq: Once | INTRAMUSCULAR | Status: AC | PRN
  Administered 2014-04-13: 100 mL via INTRAVENOUS

## 2014-04-13 MED ORDER — ADULT MULTIVITAMIN W/MINERALS CH
1.0000 | ORAL_TABLET | Freq: Every day | ORAL | Status: DC
  Administered 2014-04-13 – 2014-04-20 (×8): 1 via ORAL
  Filled 2014-04-13 (×8): qty 1

## 2014-04-13 MED ORDER — NICOTINE 14 MG/24HR TD PT24
14.0000 mg | MEDICATED_PATCH | Freq: Every day | TRANSDERMAL | Status: DC
  Administered 2014-04-13 – 2014-04-20 (×8): 14 mg via TRANSDERMAL
  Filled 2014-04-13 (×9): qty 1

## 2014-04-13 MED ORDER — CETYLPYRIDINIUM CHLORIDE 0.05 % MT LIQD
7.0000 mL | Freq: Two times a day (BID) | OROMUCOSAL | Status: DC
  Administered 2014-04-15 – 2014-04-19 (×6): 7 mL via OROMUCOSAL

## 2014-04-13 MED ORDER — LORAZEPAM 1 MG PO TABS
1.0000 mg | ORAL_TABLET | Freq: Four times a day (QID) | ORAL | Status: AC | PRN
  Administered 2014-04-13: 1 mg via ORAL
  Filled 2014-04-13 (×2): qty 1

## 2014-04-13 MED ORDER — HYDROMORPHONE HCL 1 MG/ML IJ SOLN
1.0000 mg | Freq: Once | INTRAMUSCULAR | Status: AC
  Administered 2014-04-13: 1 mg via INTRAVENOUS
  Filled 2014-04-13: qty 1

## 2014-04-13 MED ORDER — SODIUM CHLORIDE 0.9 % IV SOLN
INTRAVENOUS | Status: DC
  Administered 2014-04-14: 02:00:00 via INTRAVENOUS

## 2014-04-13 MED ORDER — CIPROFLOXACIN IN D5W 400 MG/200ML IV SOLN
400.0000 mg | Freq: Once | INTRAVENOUS | Status: DC
  Filled 2014-04-13: qty 200

## 2014-04-13 MED ORDER — CHLORHEXIDINE GLUCONATE 0.12 % MT SOLN
15.0000 mL | Freq: Two times a day (BID) | OROMUCOSAL | Status: DC
  Administered 2014-04-14 – 2014-04-20 (×10): 15 mL via OROMUCOSAL
  Filled 2014-04-13 (×16): qty 15

## 2014-04-13 MED ORDER — ONDANSETRON HCL 4 MG/2ML IJ SOLN: 4.0000 mg | Freq: Three times a day (TID) | INTRAMUSCULAR | Status: DC | PRN

## 2014-04-13 MED ORDER — THIAMINE HCL 100 MG/ML IJ SOLN
100.0000 mg | Freq: Every day | INTRAMUSCULAR | Status: DC
  Filled 2014-04-13 (×8): qty 1

## 2014-04-13 MED ORDER — FOLIC ACID 1 MG PO TABS
1.0000 mg | ORAL_TABLET | Freq: Every day | ORAL | Status: DC
  Administered 2014-04-13 – 2014-04-20 (×8): 1 mg via ORAL
  Filled 2014-04-13 (×8): qty 1

## 2014-04-13 NOTE — H&P (Signed)
Date: 04/13/2014               Patient Name:  Tina Riggs MRN: [id number]  DOB: 1980/10/30 Age / Sex: 34 y.o., female   PCP: Frederico Hamman, MD         Medical Service: Internal Medicine Teaching Service         Attending Physician: Dr. Bertha Stakes, MD    First Contact: Jethro Poling, MS4 Pager: [phone]  Second Contact: Dr. Juluis Mire Pager: [phone]        After Hours (After 5p/  First Contact Pager: [phone]  weekends / holidays): Second Contact Pager: [phone]   Chief Complaint: Abdominal pain   History of Present Illness:   Tina Riggs is pleasant 34 year old woman with past medical history of HPV infection, diverticulitis, and tobacco/alcohol use who presents with abdominal pain.   She had episode of acute diverticulitis July 2014 that required ED admission and was treated with outpatient course of ciprofloxacin and flagyl. She did not follow-up for colonoscopy.   She was feeling her normal self until a few weeks ago when she began having left sided and lower abdominal pain that she describes as pressure with few episodes of vomiting. She also had diarrhea with possible bloody movements (was eating beets at the time) and gaseous distension. Her pain has worsened in the past day and has difficulty lying on her side or walking. She reports subjective fever but denies nausea, vomiting, weight loss, change in appetite,  change in BM, or urinary symptoms. She denies chronic constipation and does not consume a lot of fiber in her diet. She has not been taking any pain medications at home. She has been able to tolerate PO intake and ate noodles today. She reports drinking 40 oz alcohol daily.   Her last menstrual period was the 15th of last month and reports it is usually heavy. She has 1 sexual partner since December and denies vaginal discharge or odor. She reports having history of BV and trichomonas in the past which she was treated for. She also has history of HPV.       Meds: No current facility-administered medications on file prior to encounter.   Current Outpatient Prescriptions on File Prior to Encounter  Medication Sig Dispense Refill  . cephALEXin (KEFLEX) 500 MG capsule Take 2 capsules (1,000 mg total) by mouth 2 (two) times daily. (Patient not taking: Reported on 04/13/2014) 28 capsule 0  . ciprofloxacin (CIPRO) 500 MG tablet Take 1 tablet (500 mg total) by mouth every 12 (twelve) hours. (Patient not taking: Reported on 04/13/2014) 20 tablet 0  . hydrOXYzine (ATARAX/VISTARIL) 10 MG tablet Take 1 tablet (10 mg total) by mouth 3 (three) times daily as needed. (Patient not taking: Reported on 04/13/2014) 30 tablet 0  . metroNIDAZOLE (FLAGYL) 500 MG tablet Take 1 tablet (500 mg total) by mouth 2 (two) times daily. (Patient not taking: Reported on 04/13/2014) 14 tablet 1  . metroNIDAZOLE (FLAGYL) 500 MG tablet Take 1 tablet (500 mg total) by mouth 3 (three) times daily. (Patient not taking: Reported on 04/13/2014) 30 tablet 0  . oxyCODONE-acetaminophen (PERCOCET/ROXICET) 5-325 MG per tablet Take 1-2 tablets by mouth every 6 (six) hours as needed for pain. (Patient not taking: Reported on 04/13/2014) 20 tablet 0  . sulfamethoxazole-trimethoprim (BACTRIM DS) 800-160 MG per tablet Take 2 tablets by mouth 2 (two) times daily. (Patient not taking: Reported on 04/13/2014) 28 tablet 0   Allergies: Allergies as of 04/13/2014 -  Review Complete 04/13/2014  Allergen Reaction Noted  . Penicillins Rash 12/29/2010   Past Medical History  Diagnosis Date  . HPV in female   . Diverticulitis     hospitalized 04/13/2014   Past Surgical History  Procedure Laterality Date  . Cesarean section  02/2006  . Colposcopy  Sep 2012   History reviewed. No pertinent family history. History   Social History  . Marital Status: Single    Spouse Name: N/A  . Number of Children: N/A  . Years of Education: N/A   Occupational History  . Not on file.   Social History Main  Topics  . Smoking status: Current Every Day Smoker -- 0.75 packs/day for 15 years    Types: Cigarettes  . Smokeless tobacco: Never Used  . Alcohol Use: 36.0 oz/week    60 Cans of beer per week     Comment: 04/13/2014 "80 beer daily or more"  . Drug Use: No  . Sexual Activity: Yes    Birth Control/ Protection: Implant   Other Topics Concern  . Not on file   Social History Narrative    Review of Systems:  Pertinent items are noted in HPI.   Physical Exam: Blood pressure 151/80, pulse 91, temperature 100.9 F (38.3 C), temperature source Oral, resp. rate 22, height 5\' 7"  (1.702 m), weight 248 lb 1.6 oz (112.537 kg), last menstrual period 03/24/2014, SpO2 99 %.  General: NAD Heart: Normal rate and rhythm  Lungs: Clear to auscultation. No wheezing, ronchi, or rales. Abdomen: Soft, tenderness to palpation left middle/upper quadrant with no guarding, rebound, or rigidity.   Extremities: No edema.  Neuro: A & O x 3   Lab results: Basic Metabolic Panel:  Recent Labs  04/13/14 1014  NA 138  K 4.0  CL 108  CO2 20  GLUCOSE 106*  BUN <5*  CREATININE 0.86  CALCIUM 8.4   Liver Function Tests:  Recent Labs  04/13/14 1014  AST 24  ALT 15  ALKPHOS 90  BILITOT 0.6  PROT 6.4  ALBUMIN 3.5    Recent Labs  04/13/14 1014  LIPASE 15   CBC:  Recent Labs  04/13/14 1014  WBC 12.7*  NEUTROABS 9.5*  HGB 13.7  HCT 41.9  MCV 94.6  PLT 314   Urinalysis:  Recent Labs  04/13/14 0959  COLORURINE YELLOW  LABSPEC 1.021  PHURINE 6.0  GLUCOSEU NEGATIVE  HGBUR TRACE*  BILIRUBINUR NEGATIVE  KETONESUR NEGATIVE  PROTEINUR NEGATIVE  UROBILINOGEN 1.0  NITRITE NEGATIVE  LEUKOCYTESUR NEGATIVE     Imaging results:  Ct Abdomen Pelvis W Contrast  04/13/2014   CLINICAL DATA:  Abdominal pain  EXAM: CT ABDOMEN AND PELVIS WITH CONTRAST  TECHNIQUE: Multidetector CT imaging of the abdomen and pelvis was performed using the standard protocol following bolus administration of  intravenous contrast. Oral contrast was also administered.  CONTRAST:  161mL OMNIPAQUE IOHEXOL 300 MG/ML  SOLN  COMPARISON:  July 13, 2012  FINDINGS: Lung bases are clear.  No focal liver lesions are identified. Gallbladder wall is not appreciably thickened. A tiny gallstone is seen within the gallbladder. There is no biliary duct dilatation.  Spleen, pancreas, and adrenals appear normal. Kidneys bilaterally show no mass or hydronephrosis on either side. There is no renal or ureteral calculus on either side.  In the pelvis, urinary bladder is midline with normal wall thickness. There is a cyst arising from the left ovary measuring 4.3 x 3.5 cm. There is no other pelvic mass. There is  no pelvic fluid collection.  There is inflammation in the proximal sigmoid colon near the rectosigmoid junction consistent with focal diverticulitis. No abscess is seen in this area. A tiny focus of air is noted within this collection suggesting a minimal microperforation. This tiny focus of air is seen on axial slice 53 series 2.  The appendix appears normal. There is no bowel obstruction. There is no free air beyond the tiny microperforation seen in the region of the sigmoid diverticulitis. There is no portal venous air.  There is no ascites, adenopathy, or abscess in the abdomen or pelvis. Aorta appears unremarkable. There are no blastic or lytic bone lesions.  IMPRESSION: Diverticulitis in the proximal sigmoid colon near the rectosigmoid junction with a tiny focus of microperforation. No abscess.  Cyst arising from the left ovary.  No bowel obstruction. No abscess. Appendix appears normal. No renal or ureteral calculus. No hydronephrosis.  Tiny gallstone in the gallbladder. Gallbladder wall does not appear thickened.   Electronically Signed   By: Lowella Grip III M.D.   On: 04/13/2014 12:29    Other results: OEU:MPNT available   Assessment & Plan by Problem:  Sepsis in setting of recurrent acute diverticulitis - No  peritoneal signs however septic (febrile, tachypneic, leukocytosis). Pt with few week history of left sided abdominal pain found to have CT image findings of diverticulitis in the proximal sigmoid colon near the rectosigmoid junction with tiny focus of microperforation with no evidence of abscess. Pt with 1st episode July 2014 with no follow-up colonoscopy. Pt received IV flagyl and ciprofloxacin in ED.  -Bowel rest and NS 125 mL/hr  -Obtain surgical consultation due to recurrent episode, may need surgical resection if does not improve with conservative management -Continue IV flagyl 500 mg Q 8 hr and start 1 g ceftriaxone daily (zosyn not used due to PCN allergy), consider broadening coverage if does not respond  -IV fentanyl 50 mcg Q 2 hr and IV morphine 2 g Q 4 hrs PRN pain  -Zofran 4 mg Q 8 hr PRN nausea  -Nutrition consult for diverticulitis diet (low fiber on discharge transitioned to eventual high fiber diet)  -Monitor CBC and obtain FOBT -Pt will need outpatient colonoscopy after resolution in 6-8 weeks to exclude malignancy  Recurrent Bacterial Vaginosis and Trichomonas Vaginitis - Pt reports history of both in the past. Denies symptoms. Wet prep with clue cells and few trichomonas.   -Continue flagyl course (500 mg BID for 7 day course) -Obtain HIV Ab -Follow-up GC/chlamydia  -Partner needs treatment as well   Alcohol Use - Pt reports drinking 40 oz alcohol daily.  -Place on CIWA protocol  -Pt counseled on cessation   Tobacco Use - Pt reports smoking 0.7 pack a day for past 15 years.  -Place nicotine 14 mg patch daily -Pt counseled on cessation   Diet: NPO DVT Ppx: Lovenox Code: Full   Dispo: Disposition is deferred at this time, awaiting improvement of current medical problems. Anticipated discharge in approximately 2-4 day(s).   The patient does have a current PCP Frederico Hamman, MD) and does need an [facility] hospital follow-up appointment after discharge.  The patient  does not have transportation limitations that hinder transportation to clinic appointments.  Signed: Juluis Mire, MD 04/13/2014, 8:24 PM

## 2014-04-13 NOTE — Consult Note (Signed)
Reason for Consult:diverticulitis Referring Physician: Dr Karin Golden is an 34 y.o. female.  HPI: 34 yo obese AAF developed Left sided abdominal pain last night. It gradually worsened throughout the evening and night and didn't go away so she came to the ED. She denies f/c/n/v. Had some loose stool. Last bm in ED. No weight loss. No melena/hematochezia. Reports an episode of intense abd pain about 2 weeks that lasted for about 1.5 days associated with n/v. Had prior episode of divertliculitis in 4259 and was released from the ED. No colonscopy  Drinks around 120oz of beer/alcohol per day. Smokes 3/4 ppd.   Past Medical History  Diagnosis Date  . HPV in female     Past Surgical History  Procedure Laterality Date  . Cesarean section    . Colposcopy  Sep 2012    No family history on file.  Social History:  reports that she has been smoking Cigarettes.  She has a 9 pack-year smoking history. She does not have any smokeless tobacco history on file. She reports that she drinks alcohol. She reports that she does not use illicit drugs.  Allergies:  Allergies  Allergen Reactions  . Penicillins Rash    Medications: I have reviewed the patient's current medications.  Results for orders placed or performed during the hospital encounter of 04/13/14 (from the past 48 hour(s))  Urinalysis, Routine w reflex microscopic     Status: Abnormal   Collection Time: 04/13/14  9:59 AM  Result Value Ref Range   Color, Urine YELLOW YELLOW   APPearance CLEAR CLEAR   Specific Gravity, Urine 1.021 1.005 - 1.030   pH 6.0 5.0 - 8.0   Glucose, UA NEGATIVE NEGATIVE mg/dL   Hgb urine dipstick TRACE (A) NEGATIVE   Bilirubin Urine NEGATIVE NEGATIVE   Ketones, ur NEGATIVE NEGATIVE mg/dL   Protein, ur NEGATIVE NEGATIVE mg/dL   Urobilinogen, UA 1.0 0.0 - 1.0 mg/dL   Nitrite NEGATIVE NEGATIVE   Leukocytes, UA NEGATIVE NEGATIVE  Urine microscopic-add on     Status: Abnormal   Collection Time:  04/13/14  9:59 AM  Result Value Ref Range   Squamous Epithelial / LPF FEW (A) RARE   WBC, UA 0-2 <3 WBC/hpf   RBC / HPF 3-6 <3 RBC/hpf   Bacteria, UA FEW (A) RARE  CBC with Differential     Status: Abnormal   Collection Time: 04/13/14 10:14 AM  Result Value Ref Range   WBC 12.7 (H) 4.0 - 10.5 K/uL   RBC 4.43 3.87 - 5.11 MIL/uL   Hemoglobin 13.7 12.0 - 15.0 g/dL   HCT 41.9 36.0 - 46.0 %   MCV 94.6 78.0 - 100.0 fL   MCH 30.9 26.0 - 34.0 pg   MCHC 32.7 30.0 - 36.0 g/dL   RDW 14.1 11.5 - 15.5 %   Platelets 314 150 - 400 K/uL   Neutrophils Relative % 75 43 - 77 %   Neutro Abs 9.5 (H) 1.7 - 7.7 K/uL   Lymphocytes Relative 18 12 - 46 %   Lymphs Abs 2.3 0.7 - 4.0 K/uL   Monocytes Relative 7 3 - 12 %   Monocytes Absolute 0.8 0.1 - 1.0 K/uL   Eosinophils Relative 0 0 - 5 %   Eosinophils Absolute 0.0 0.0 - 0.7 K/uL   Basophils Relative 0 0 - 1 %   Basophils Absolute 0.0 0.0 - 0.1 K/uL  Comprehensive metabolic panel     Status: Abnormal   Collection Time: 04/13/14  10:14 AM  Result Value Ref Range   Sodium 138 135 - 145 mmol/L   Potassium 4.0 3.5 - 5.1 mmol/L   Chloride 108 96 - 112 mmol/L   CO2 20 19 - 32 mmol/L   Glucose, Bld 106 (H) 70 - 99 mg/dL   BUN <5 (L) 6 - 23 mg/dL   Creatinine, Ser 0.86 0.50 - 1.10 mg/dL   Calcium 8.4 8.4 - 10.5 mg/dL   Total Protein 6.4 6.0 - 8.3 g/dL   Albumin 3.5 3.5 - 5.2 g/dL   AST 24 0 - 37 U/L   ALT 15 0 - 35 U/L   Alkaline Phosphatase 90 39 - 117 U/L   Total Bilirubin 0.6 0.3 - 1.2 mg/dL   GFR calc non Af Amer 89 (L) >90 mL/min   GFR calc Af Amer >90 >90 mL/min    Comment: (NOTE) The eGFR has been calculated using the CKD EPI equation. This calculation has not been validated in all clinical situations. eGFR's persistently <90 mL/min signify possible Chronic Kidney Disease.    Anion gap 10 5 - 15  Lipase, blood     Status: None   Collection Time: 04/13/14 10:14 AM  Result Value Ref Range   Lipase 15 11 - 59 U/L  POC Urine Pregnancy, ED   (If Pre-menopausal female) - do not order at [facility]     Status: None   Collection Time: 04/13/14 10:24 AM  Result Value Ref Range   Preg Test, Ur NEGATIVE NEGATIVE    Comment:        THE SENSITIVITY OF THIS METHODOLOGY IS >24 mIU/mL   Wet prep, genital     Status: Abnormal   Collection Time: 04/13/14 10:45 AM  Result Value Ref Range   Yeast Wet Prep HPF POC NONE SEEN NONE SEEN   Trich, Wet Prep FEW (A) NONE SEEN   Clue Cells Wet Prep HPF POC FEW (A) NONE SEEN   WBC, Wet Prep HPF POC FEW (A) NONE SEEN    Ct Abdomen Pelvis W Contrast  04/13/2014   CLINICAL DATA:  Abdominal pain  EXAM: CT ABDOMEN AND PELVIS WITH CONTRAST  TECHNIQUE: Multidetector CT imaging of the abdomen and pelvis was performed using the standard protocol following bolus administration of intravenous contrast. Oral contrast was also administered.  CONTRAST:  138m OMNIPAQUE IOHEXOL 300 MG/ML  SOLN  COMPARISON:  July 13, 2012  FINDINGS: Lung bases are clear.  No focal liver lesions are identified. Gallbladder wall is not appreciably thickened. A tiny gallstone is seen within the gallbladder. There is no biliary duct dilatation.  Spleen, pancreas, and adrenals appear normal. Kidneys bilaterally show no mass or hydronephrosis on either side. There is no renal or ureteral calculus on either side.  In the pelvis, urinary bladder is midline with normal wall thickness. There is a cyst arising from the left ovary measuring 4.3 x 3.5 cm. There is no other pelvic mass. There is no pelvic fluid collection.  There is inflammation in the proximal sigmoid colon near the rectosigmoid junction consistent with focal diverticulitis. No abscess is seen in this area. A tiny focus of air is noted within this collection suggesting a minimal microperforation. This tiny focus of air is seen on axial slice 53 series 2.  The appendix appears normal. There is no bowel obstruction. There is no free air beyond the tiny microperforation seen in the region of the  sigmoid diverticulitis. There is no portal venous air.  There is no ascites,  adenopathy, or abscess in the abdomen or pelvis. Aorta appears unremarkable. There are no blastic or lytic bone lesions.  IMPRESSION: Diverticulitis in the proximal sigmoid colon near the rectosigmoid junction with a tiny focus of microperforation. No abscess.  Cyst arising from the left ovary.  No bowel obstruction. No abscess. Appendix appears normal. No renal or ureteral calculus. No hydronephrosis.  Tiny gallstone in the gallbladder. Gallbladder wall does not appear thickened.   Electronically Signed   By: Lowella Grip III M.D.   On: 04/13/2014 12:29    Review of Systems  Constitutional: Negative for fever, chills and weight loss.  HENT: Negative for nosebleeds.   Eyes: Negative for blurred vision.  Respiratory: Negative for shortness of breath.   Cardiovascular: Negative for chest pain, palpitations, orthopnea and PND.       Denies DOE  Gastrointestinal: Positive for abdominal pain. Negative for nausea, vomiting, diarrhea, blood in stool and melena.  Genitourinary: Negative for dysuria and hematuria.  Musculoskeletal: Negative.   Skin: Negative for itching and rash.  Neurological: Negative for dizziness, focal weakness, seizures, loss of consciousness and headaches.       Denies TIAs, amaurosis fugax  Endo/Heme/Allergies: Does not bruise/bleed easily.  Psychiatric/Behavioral: Positive for substance abuse. The patient is not nervous/anxious.    Blood pressure 151/80, pulse 91, temperature 100.9 F (38.3 C), temperature source Oral, resp. rate 22, height _0  (1.702 m), weight 112.537 kg (248 lb 1.6 oz), last menstrual period 03/24/2014, SpO2 99 %. Physical Exam  Vitals reviewed. Constitutional: She is oriented to person, place, and time. She appears well-developed and well-nourished. No distress.  Obese AAF, nontoxic, NAD. texting on cell phone  HENT:  Head: Normocephalic and atraumatic.  Right Ear:  External ear normal.  Left Ear: External ear normal.  Eyes: Conjunctivae are normal. No scleral icterus.  Neck: Normal range of motion. Neck supple. No tracheal deviation present. No thyromegaly present.  Cardiovascular: Normal rate and normal heart sounds.   Respiratory: Effort normal and breath sounds normal. No stridor. No respiratory distress. She has no wheezes.  GI: Soft. She exhibits no distension. There is tenderness. There is no rigidity, no rebound and no guarding. No hernia.    Left mid abdomen TTP; no guarding/rebound/peritonitis  Musculoskeletal: She exhibits no edema or tenderness.  Lymphadenopathy:    She has no cervical adenopathy.  Neurological: She is alert and oriented to person, place, and time. She exhibits normal muscle tone.  Skin: Skin is warm and dry. No rash noted. She is not diaphoretic. No erythema. No pallor.  Psychiatric: She has a normal mood and affect. Her behavior is normal. Judgment and thought content normal.    Assessment/Plan: Sigmoid diverticulitis with microperforation Alcohol use Tobacco use. Obesity  Discussed diverticulosis/diverticulitis with pt and family. No tachycardia. No peritonitis. Not ill appearing.   Bowel rest, npo IV abx Check cbc in am CIWA protocol If able to get thru this episode without surgery (likely) will need outpt GI referral for colonoscopy in 6-8 weeks As we get closer to discharge, pt would benefit from nutrition consult for high fiber diet. Will need low fiber diet on discharge but ultimately transition to high fiber diet over several weeks.   Leighton Ruff. Redmond Pulling, MD, FACS General, Bariatric, & Minimally Invasive Surgery [facility], [place]    [facility] M 04/13/2014, 5:57 PM

## 2014-04-13 NOTE — ED Notes (Signed)
Pt finished drinking contrast. CT aware. 

## 2014-04-13 NOTE — H&P (Signed)
Internal Medicine Attending Admission Note Date: 04/13/2014  Patient name: Tina Riggs Medical record number: [id number] Date of birth: Mar 16, 1980 Age: 34 y.o. Gender: female  I saw and evaluated the patient. I reviewed the resident's note and I agree with the resident's findings and plan as documented in the resident's note, with the following additional comments.  Chief Complaint(s): Abdominal pain  History - key components related to admission: Patient is a 34 year old woman with a history of sigmoid diverticulitis managed as an outpatient with oral antibiotics in July 2014, now admitted with complaint of left abdominal pain which began a few days ago as a feeling of pressure, with onset of pain yesterday that worsened overnight.  She denies fever, chills, chest pain, shortness of breath, nausea, vomiting, diarrhea, bright red blood per rectum, melena    Physical Exam - key components related to admission:  Filed Vitals:   04/13/14 1330 04/13/14 1430 04/13/14 1619 04/13/14 1712  BP: 148/88 153/110  151/80  Pulse: 76 90  91  Temp:  100.5 F (38.1 C) 100.4 F (38 C) 100.9 F (38.3 C)  TempSrc:  Oral Oral Oral  Resp: 26 21  22   Height:  5\' 7"  (1.702 m)    Weight:  248 lb 1.6 oz (112.537 kg)    SpO2: 99% 97%  99%    General: Alert, oriented, no acute distress Lungs: Clear Heart: Regular; no extra sounds or murmurs Abdomen: Bowel sounds present, soft; tender in left mid and lower abdomen; no rebound Extremities: No edema  Lab results:   Basic Metabolic Panel:  Recent Labs  04/13/14 1014  NA 138  K 4.0  CL 108  CO2 20  GLUCOSE 106*  BUN <5*  CREATININE 0.86  CALCIUM 8.4    Liver Function Tests:  Recent Labs  04/13/14 1014  AST 24  ALT 15  ALKPHOS 90  BILITOT 0.6  PROT 6.4  ALBUMIN 3.5    Recent Labs  04/13/14 1014  LIPASE 15     CBC:  Recent Labs  04/13/14 1014  WBC 12.7*  HGB 13.7  HCT 41.9  MCV 94.6  PLT 314    Recent Labs   04/13/14 1014  NEUTROABS 9.5*  LYMPHSABS 2.3  MONOABS 0.8  EOSABS 0.0  BASOSABS 0.0     Urinalysis    Component Value Date/Time   COLORURINE YELLOW 04/13/2014 [place] 04/13/2014 0959   LABSPEC 1.021 04/13/2014 0959   PHURINE 6.0 04/13/2014 [place] 04/13/2014 0959   HGBUR TRACE* 04/13/2014 0959   BILIRUBINUR NEGATIVE 04/13/2014 0959   KETONESUR NEGATIVE 04/13/2014 0959   PROTEINUR NEGATIVE 04/13/2014 0959   UROBILINOGEN 1.0 04/13/2014 0959   NITRITE NEGATIVE 04/13/2014 0959   LEUKOCYTESUR NEGATIVE 04/13/2014 0959    Urine microscopic:  Recent Labs  04/13/14 0959  EPIU FEW*  WBCU 0-2  RBCU 3-6  BACTERIA FEW*    Imaging results:  Ct Abdomen Pelvis W Contrast  04/13/2014   CLINICAL DATA:  Abdominal pain  EXAM: CT ABDOMEN AND PELVIS WITH CONTRAST  TECHNIQUE: Multidetector CT imaging of the abdomen and pelvis was performed using the standard protocol following bolus administration of intravenous contrast. Oral contrast was also administered.  CONTRAST:  184mL OMNIPAQUE IOHEXOL 300 MG/ML  SOLN  COMPARISON:  July 13, 2012  FINDINGS: Lung bases are clear.  No focal liver lesions are identified. Gallbladder wall is not appreciably thickened. A tiny gallstone is seen within the gallbladder. There is no biliary duct  dilatation.  Spleen, pancreas, and adrenals appear normal. Kidneys bilaterally show no mass or hydronephrosis on either side. There is no renal or ureteral calculus on either side.  In the pelvis, urinary bladder is midline with normal wall thickness. There is a cyst arising from the left ovary measuring 4.3 x 3.5 cm. There is no other pelvic mass. There is no pelvic fluid collection.  There is inflammation in the proximal sigmoid colon near the rectosigmoid junction consistent with focal diverticulitis. No abscess is seen in this area. A tiny focus of air is noted within this collection suggesting a minimal microperforation. This tiny focus  of air is seen on axial slice 53 series 2.  The appendix appears normal. There is no bowel obstruction. There is no free air beyond the tiny microperforation seen in the region of the sigmoid diverticulitis. There is no portal venous air.  There is no ascites, adenopathy, or abscess in the abdomen or pelvis. Aorta appears unremarkable. There are no blastic or lytic bone lesions.  IMPRESSION: Diverticulitis in the proximal sigmoid colon near the rectosigmoid junction with a tiny focus of microperforation. No abscess.  Cyst arising from the left ovary.  No bowel obstruction. No abscess. Appendix appears normal. No renal or ureteral calculus. No hydronephrosis.  Tiny gallstone in the gallbladder. Gallbladder wall does not appear thickened.   Electronically Signed   By: Lowella Grip III M.D.   On: 04/13/2014 12:29     Assessment & Plan by Problem:  1.  Diverticulitis.  Plans include nothing by mouth; empiric IV antibiotics (ceftriaxone and metronidazole); IV fluids; pain control; antiemetics; general surgery consult appreciated.  2.  Trichomoniasis and bacterial vaginosis.  Metronidazole will treat appropriately.  3.  Other problems and plans as per the resident physician's note.

## 2014-04-13 NOTE — H&P (Signed)
Date: 04/13/2014               Patient Name:  Tina Riggs MRN: [id number]  DOB: [birth year] Age / Sex: 34 y.o., female   PCP: Frederico Hamman, MD         Medical Service: Internal Medicine Teaching Service         Attending Physician: Dr. Bertha Stakes, MD    First Contact: Jethro Poling, MS4 Pager: [phone]  Second Contact: Dr. Juluis Mire Pager: [phone]       After Hours (After 5p/  First Contact Pager: [phone]  weekends / holidays): Second Contact Pager: [phone]   Chief Complaint: Abdominal pain  History of Present Illness: Patient is a 34 year old female presenting with worsening abdominal and epigastric "pressure" for the past few weeks, periodically associated with emesis. Reports that the pain worsened yesterday, and woke her from sleep last night. She describes the pain as changing from a "pressure" to a shooting pain and was unable to lie on her side. She reports that she finds it painful to walk. She denies any emesis since this acute worsening. The pain is non-radiating, worse on the left side, with some pain in the epigastric region. She has tried [facility] powders periodically for the past few weeks with no relief.  The patient endorses bloating, flatulence, and diarrhea for the past few weeks. She said she ate a can of beets and noted dark red colored stool but believes this was 2/2 beet consumption and not blood. She denies any change in appetite and last ate last night before bed.   The patient denies any recent constipation or straining with defecation or h/o hemorrhoids. Denies nausea, headaches, change in appetite, lightheadedness/dizziness, or urinary symptoms including burning with urination or vaginal discharge.  Patient endorses a similar presentation in July 2014. CT Abdomen/pelvis from 07/13/2012 significant for focal inflammatory changes in the junction of the sigmoid and descending colon consistent with diverticulitis. Patient was managed with outpatient  antibiotics including cephalexin 1000mg  BID, metronidazole 500mg  BID, and sulfamethoxazole-trimethoprim 800-160, two tabs BID.  Meds: Current Facility-Administered Medications  Medication Dose Route Frequency Provider Last Rate Last Dose  . 0.9 %  sodium chloride infusion   Intravenous STAT Hollace Kinnier [place], PA-C 125 mL/hr at 04/13/14 1431    . ciprofloxacin (CIPRO) IVPB 400 mg  400 mg Intravenous Once [place], PA-C      . fentaNYL (SUBLIMAZE) injection 50 mcg  50 mcg Intravenous Q2H PRN Fransico Meadow, PA-C      . metroNIDAZOLE (FLAGYL) IVPB 500 mg  500 mg Intravenous Once Fransico Meadow, PA-C 100 mL/hr at 04/13/14 1414 500 mg at 04/13/14 1414  . ondansetron (ZOFRAN) injection 4 mg  4 mg Intravenous Q8H PRN Fransico Meadow, PA-C        Allergies: Allergies as of 04/13/2014 - Review Complete 04/13/2014  Allergen Reaction Noted  . Penicillins Rash 12/29/2010   Past Medical History  Diagnosis Date  . HPV in female    Past Surgical History  Procedure Laterality Date  . Cesarean section    . Colposcopy  Sep 2012   No family history on file. History   Social History  . Marital Status: Single    Spouse Name: N/A  . Number of Children: N/A  . Years of Education: N/A   Occupational History  . Not on file.   Social History Main Topics  . Smoking status: Current Every Day Smoker -- 0.75 packs/day  for 12 years    Types: Cigarettes  . Smokeless tobacco: Not on file  . Alcohol Use: Yes     Comment: >40oz beer daily  . Drug Use: No  . Sexual Activity: Not on file   Other Topics Concern  . Not on file   Social History Narrative    Review of Systems: GI: positive for diarrhea, abdominal pain, remote h/o vomiting.   Physical Exam: Blood pressure 153/110, pulse 90, temperature 100.5 F (38.1 C), temperature source Oral, resp. rate 21, height 5\' 7"  (1.702 m), weight 112.537 kg (248 lb 1.6 oz), last menstrual period 03/24/2014, SpO2 97 %. General appearance: alert,  cooperative and moderate distress Head: Normocephalic, without obvious abnormality, atraumatic Lungs: clear to auscultation bilaterally Heart: regular rate and rhythm, S1, S2 normal, no murmur, click, rub or gallop Abdomen: normal BS, Obese, tender to palpation in LLQ and LUQ, no rebound or guarding Extremities: extremities normal, atraumatic, no cyanosis, trace edema Neurologic: Grossly normal  Lab results: Basic Metabolic Panel:  Recent Labs  04/13/14 1014  NA 138  K 4.0  CL 108  CO2 20  GLUCOSE 106*  BUN <5*  CREATININE 0.86  CALCIUM 8.4   Liver Function Tests:  Recent Labs  04/13/14 1014  AST 24  ALT 15  ALKPHOS 90  BILITOT 0.6  PROT 6.4  ALBUMIN 3.5    Recent Labs  04/13/14 1014  LIPASE 15   CBC:  Recent Labs  04/13/14 1014  WBC 12.7*  NEUTROABS 9.5*  HGB 13.7  HCT 41.9  MCV 94.6  PLT 314   Urinalysis:  Recent Labs  04/13/14 0959  COLORURINE YELLOW  LABSPEC 1.021  PHURINE 6.0  GLUCOSEU NEGATIVE  HGBUR TRACE*  BILIRUBINUR NEGATIVE  KETONESUR NEGATIVE  PROTEINUR NEGATIVE  UROBILINOGEN 1.0  NITRITE NEGATIVE  LEUKOCYTESUR NEGATIVE   Imaging results:  Ct Abdomen Pelvis W Contrast  04/13/2014   CLINICAL DATA:  Abdominal pain  EXAM: CT ABDOMEN AND PELVIS WITH CONTRAST  TECHNIQUE: Multidetector CT imaging of the abdomen and pelvis was performed using the standard protocol following bolus administration of intravenous contrast. Oral contrast was also administered.  CONTRAST:  12mL OMNIPAQUE IOHEXOL 300 MG/ML  SOLN  COMPARISON:  July 13, 2012  FINDINGS: Lung bases are clear.  No focal liver lesions are identified. Gallbladder wall is not appreciably thickened. A tiny gallstone is seen within the gallbladder. There is no biliary duct dilatation.  Spleen, pancreas, and adrenals appear normal. Kidneys bilaterally show no mass or hydronephrosis on either side. There is no renal or ureteral calculus on either side.  In the pelvis, urinary bladder is  midline with normal wall thickness. There is a cyst arising from the left ovary measuring 4.3 x 3.5 cm. There is no other pelvic mass. There is no pelvic fluid collection.  There is inflammation in the proximal sigmoid colon near the rectosigmoid junction consistent with focal diverticulitis. No abscess is seen in this area. A tiny focus of air is noted within this collection suggesting a minimal microperforation. This tiny focus of air is seen on axial slice 53 series 2.  The appendix appears normal. There is no bowel obstruction. There is no free air beyond the tiny microperforation seen in the region of the sigmoid diverticulitis. There is no portal venous air.  There is no ascites, adenopathy, or abscess in the abdomen or pelvis. Aorta appears unremarkable. There are no blastic or lytic bone lesions.  IMPRESSION: Diverticulitis in the proximal sigmoid colon near the  rectosigmoid junction with a tiny focus of microperforation. No abscess.  Cyst arising from the left ovary.  No bowel obstruction. No abscess. Appendix appears normal. No renal or ureteral calculus. No hydronephrosis.  Tiny gallstone in the gallbladder. Gallbladder wall does not appear thickened.   Electronically Signed   By: Lowella Grip III M.D.   On: 04/13/2014 12:29    Assessment & Plan by Problem: Active Problems:   Diverticulitis   Diverticulitis of colon with perforation   Bacterial vaginosis  Diverticulitis with micro perforation: Patient presents with worsening abdominal pain. CT abdomen confirms diverticulitis in proximal sigmoid colon, previously appreciated on scan from 07/2012, now with tiny focus of microperforation without abscess. Diffusely tender on palpation, worse in LUQ and LLQ. No rebound or guarding. Will require colonoscopy 8 weeks following d/c.  - c/s surgery, appreciate recs - IV fentanyl 64mcg q2hr prn for pain -NPO x 24 hours - IV ceftriaxone 1g q24 hours/ IV metronidazole 500mg  q8 hours, consider switch  to zosyn if no improvement pending confirmation of allergey to penicillin.  - IVF NS @ 125cc/hr - IV ondansetron 4mg  q8hrs prn for nausea  BV & trichominiasis: Clue cells and few trich detected on wet prep. - 7-day course of metronidazole 500mg  BID   Dispo: Disposition is deferred at this time, awaiting improvement of current medical problems. Anticipated discharge in approximately 1 day(s).   The patient does have a current PCP Frederico Hamman, MD) and does need an [facility] hospital follow-up appointment after discharge.  The patient does not have transportation limitations that hinder transportation to clinic appointments.  Signed: Carolan Shiver, Med Student 04/13/2014, 2:55 PM

## 2014-04-13 NOTE — Progress Notes (Signed)
Received ED report from [place].  Joellen Jersey, RN.

## 2014-04-13 NOTE — ED Provider Notes (Signed)
CSN: [id number]     Arrival date & time 04/13/14  0930 History   First MD Initiated Contact with Patient 04/13/14 [phone]     No chief complaint on file.    (Consider location/radiation/quality/duration/timing/severity/associated sxs/prior Treatment) Patient is a 34 y.o. female presenting with abdominal pain. The history is provided by the patient. No language interpreter was used.  Abdominal Pain Pain location:  RLQ and LLQ Pain quality: aching and sharp   Pain radiates to:  Does not radiate Pain severity:  Moderate Onset quality:  Gradual Duration:  4 days Timing:  Constant Progression:  Worsening Chronicity:  New Context: not alcohol use   Relieved by:  Nothing Worsened by:  Nothing tried Ineffective treatments:  None tried Associated symptoms: diarrhea and vomiting   Risk factors: has not had multiple surgeries     Past Medical History  Diagnosis Date  . No pertinent past medical history    Past Surgical History  Procedure Laterality Date  . Cesarean section    . Colposcopy  Sep 2012   No family history on file. History  Substance Use Topics  . Smoking status: Current Every Day Smoker -- 0.50 packs/day for 12 years    Types: Cigarettes  . Smokeless tobacco: Not on file  . Alcohol Use: Yes   OB History    Gravida Para Term Preterm AB TAB SAB Ectopic Multiple Living   1 1 1       1      Review of Systems  Gastrointestinal: Positive for vomiting, abdominal pain and diarrhea.  All other systems reviewed and are negative.     Allergies  Penicillins  Home Medications   Prior to Admission medications   Medication Sig Start Date End Date Taking? Authorizing Provider  cephALEXin (KEFLEX) 500 MG capsule Take 2 capsules (1,000 mg total) by mouth 2 (two) times daily. 09/18/11   Nicole Pisciotta, PA-C  ciprofloxacin (CIPRO) 500 MG tablet Take 1 tablet (500 mg total) by mouth every 12 (twelve) hours. 07/13/12   John Molpus, MD  hydrOXYzine (ATARAX/VISTARIL) 10 MG  tablet Take 1 tablet (10 mg total) by mouth 3 (three) times daily as needed. 09/08/13   Montine Circle, PA-C  metroNIDAZOLE (FLAGYL) 500 MG tablet Take 1 tablet (500 mg total) by mouth 2 (two) times daily. 02/07/12   Fransico Meadow, PA-C  metroNIDAZOLE (FLAGYL) 500 MG tablet Take 1 tablet (500 mg total) by mouth 3 (three) times daily. 07/13/12   John Molpus, MD  oxyCODONE-acetaminophen (PERCOCET/ROXICET) 5-325 MG per tablet Take 1-2 tablets by mouth every 6 (six) hours as needed for pain. 07/13/12   John Molpus, MD  sulfamethoxazole-trimethoprim (BACTRIM DS) 800-160 MG per tablet Take 2 tablets by mouth 2 (two) times daily. 09/18/11   Nicole Pisciotta, PA-C   There were no vitals taken for this visit. Physical Exam  Constitutional: She is oriented to person, place, and time. She appears well-developed and well-nourished.  HENT:  Head: Normocephalic and atraumatic.  Right Ear: External ear normal.  Left Ear: External ear normal.  Nose: Nose normal.  Mouth/Throat: Oropharynx is clear and moist.  Eyes: Conjunctivae and EOM are normal. Pupils are equal, round, and reactive to light.  Neck: Normal range of motion.  Cardiovascular: Normal rate and regular rhythm.   Pulmonary/Chest: Effort normal.  Abdominal: Soft. She exhibits no distension. There is tenderness.  Tender right and left lower abdomen,  Left greater than right  Genitourinary: Vaginal discharge found.  White discharge,  Cervix non tender, adnexa  non tender   Musculoskeletal: Normal range of motion.  Neurological: She is alert and oriented to person, place, and time.  Skin: Skin is warm.  Psychiatric: She has a normal mood and affect.  Nursing note and vitals reviewed.   ED Course  Procedures (including critical care time) Labs Review Labs Reviewed  WET PREP, GENITAL - Abnormal; Notable for the following:    Trich, Wet Prep FEW (*)    Clue Cells Wet Prep HPF POC FEW (*)    WBC, Wet Prep HPF POC FEW (*)    All other components  within normal limits  CBC WITH DIFFERENTIAL/PLATELET - Abnormal; Notable for the following:    WBC 12.7 (*)    Neutro Abs 9.5 (*)    All other components within normal limits  COMPREHENSIVE METABOLIC PANEL - Abnormal; Notable for the following:    Glucose, Bld 106 (*)    BUN <5 (*)    GFR calc non Af Amer 89 (*)    All other components within normal limits  URINALYSIS, ROUTINE W REFLEX MICROSCOPIC - Abnormal; Notable for the following:    Hgb urine dipstick TRACE (*)    All other components within normal limits  URINE MICROSCOPIC-ADD ON - Abnormal; Notable for the following:    Squamous Epithelial / LPF FEW (*)    Bacteria, UA FEW (*)    All other components within normal limits  LIPASE, BLOOD  POC URINE PREG, ED  GC/CHLAMYDIA PROBE AMP ([place])    Imaging Review Ct Abdomen Pelvis W Contrast  04/13/2014   CLINICAL DATA:  Abdominal pain  EXAM: CT ABDOMEN AND PELVIS WITH CONTRAST  TECHNIQUE: Multidetector CT imaging of the abdomen and pelvis was performed using the standard protocol following bolus administration of intravenous contrast. Oral contrast was also administered.  CONTRAST:  160mL OMNIPAQUE IOHEXOL 300 MG/ML  SOLN  COMPARISON:  July 13, 2012  FINDINGS: Lung bases are clear.  No focal liver lesions are identified. Gallbladder wall is not appreciably thickened. A tiny gallstone is seen within the gallbladder. There is no biliary duct dilatation.  Spleen, pancreas, and adrenals appear normal. Kidneys bilaterally show no mass or hydronephrosis on either side. There is no renal or ureteral calculus on either side.  In the pelvis, urinary bladder is midline with normal wall thickness. There is a cyst arising from the left ovary measuring 4.3 x 3.5 cm. There is no other pelvic mass. There is no pelvic fluid collection.  There is inflammation in the proximal sigmoid colon near the rectosigmoid junction consistent with focal diverticulitis. No abscess is seen in this area. A tiny focus  of air is noted within this collection suggesting a minimal microperforation. This tiny focus of air is seen on axial slice 53 series 2.  The appendix appears normal. There is no bowel obstruction. There is no free air beyond the tiny microperforation seen in the region of the sigmoid diverticulitis. There is no portal venous air.  There is no ascites, adenopathy, or abscess in the abdomen or pelvis. Aorta appears unremarkable. There are no blastic or lytic bone lesions.  IMPRESSION: Diverticulitis in the proximal sigmoid colon near the rectosigmoid junction with a tiny focus of microperforation. No abscess.  Cyst arising from the left ovary.  No bowel obstruction. No abscess. Appendix appears normal. No renal or ureteral calculus. No hydronephrosis.  Tiny gallstone in the gallbladder. Gallbladder wall does not appear thickened.   Electronically Signed   By: Lowella Grip III M.D.  On: 04/13/2014 12:29     EKG Interpretation None      MDM  I spoke with the internal medicine resident.  Dr. Cyndi Lennert who will admit.  Pt admitted to Dr. Marinda Elk service.  Pt given cipro and flagyl here.  Pt counseled on findings   Final diagnoses:  Diverticulitis of intestine with perforation without bleeding  Trichomonal vaginitis    I    Fransico Meadow, PA-C 04/13/14 1317  Quintella Reichert, MD 04/13/14 1544

## 2014-04-13 NOTE — Progress Notes (Signed)
Admission note:   Arrival Method: From ED. Mental Status: A&OX4. Telemetry: N/A  Skin: Intact.  Tubes: N/A IV: RAC IVF infusing. Pain: 10/10, will give IV fentanyl when able.  Family: No one at bedside. Living Situation: From home. Safety Measures: Call bell within reach, bed alarm in place. 6E Orientation: Oriented to unit and surroundings.  MD aware of patient's arrival. Waiting on admission orders.  Joellen Jersey, RN.
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
## 2014-04-14 DIAGNOSIS — A749 Chlamydial infection, unspecified: Secondary | ICD-10-CM | POA: Diagnosis present

## 2014-04-14 DIAGNOSIS — A419 Sepsis, unspecified organism: Principal | ICD-10-CM

## 2014-04-14 DIAGNOSIS — F1010 Alcohol abuse, uncomplicated: Secondary | ICD-10-CM

## 2014-04-14 DIAGNOSIS — F17200 Nicotine dependence, unspecified, uncomplicated: Secondary | ICD-10-CM

## 2014-04-14 DIAGNOSIS — A5901 Trichomonal vulvovaginitis: Secondary | ICD-10-CM | POA: Insufficient documentation

## 2014-04-14 DIAGNOSIS — B9689 Other specified bacterial agents as the cause of diseases classified elsewhere: Secondary | ICD-10-CM

## 2014-04-14 DIAGNOSIS — N760 Acute vaginitis: Secondary | ICD-10-CM

## 2014-04-14 DIAGNOSIS — K5792 Diverticulitis of intestine, part unspecified, without perforation or abscess without bleeding: Secondary | ICD-10-CM

## 2014-04-14 LAB — CBC
HEMATOCRIT: 37.3 % (ref 36.0–46.0)
HEMOGLOBIN: 12.1 g/dL (ref 12.0–15.0)
MCH: 30.7 pg (ref 26.0–34.0)
MCHC: 32.4 g/dL (ref 30.0–36.0)
MCV: 94.7 fL (ref 78.0–100.0)
Platelets: 285 10*3/uL (ref 150–400)
RBC: 3.94 MIL/uL (ref 3.87–5.11)
RDW: 14.2 % (ref 11.5–15.5)
WBC: 10.2 10*3/uL (ref 4.0–10.5)

## 2014-04-14 LAB — HIV ANTIBODY (ROUTINE TESTING W REFLEX): HIV Screen 4th Generation wRfx: NONREACTIVE

## 2014-04-14 LAB — GC/CHLAMYDIA PROBE AMP (~~LOC~~) NOT AT ARMC
Chlamydia: POSITIVE — AB
NEISSERIA GONORRHEA: NEGATIVE

## 2014-04-14 LAB — OCCULT BLOOD X 1 CARD TO LAB, STOOL: Fecal Occult Bld: NEGATIVE

## 2014-04-14 LAB — MRSA PCR SCREENING: MRSA BY PCR: NEGATIVE

## 2014-04-14 LAB — MAGNESIUM: Magnesium: 2 mg/dL (ref 1.5–2.5)

## 2014-04-14 LAB — PHOSPHORUS: Phosphorus: 2.8 mg/dL (ref 2.3–4.6)

## 2014-04-14 MED ORDER — MORPHINE SULFATE 2 MG/ML IJ SOLN
1.0000 mg | INTRAMUSCULAR | Status: DC | PRN
  Administered 2014-04-14 (×2): 1 mg via INTRAVENOUS
  Filled 2014-04-14 (×2): qty 1

## 2014-04-14 MED ORDER — MORPHINE SULFATE 2 MG/ML IJ SOLN
2.0000 mg | INTRAMUSCULAR | Status: DC | PRN
  Administered 2014-04-14: 2 mg via INTRAVENOUS
  Filled 2014-04-14: qty 1

## 2014-04-14 MED ORDER — AZITHROMYCIN 500 MG PO TABS
1000.0000 mg | ORAL_TABLET | Freq: Once | ORAL | Status: AC
  Administered 2014-04-14: 1000 mg via ORAL
  Filled 2014-04-14: qty 2

## 2014-04-14 MED ORDER — DEXTROSE-NACL 5-0.9 % IV SOLN
INTRAVENOUS | Status: DC
  Administered 2014-04-14 – 2014-04-17 (×4): via INTRAVENOUS

## 2014-04-14 MED ORDER — ONDANSETRON HCL 4 MG/2ML IJ SOLN
4.0000 mg | Freq: Four times a day (QID) | INTRAMUSCULAR | Status: DC | PRN
  Administered 2014-04-14: 4 mg via INTRAVENOUS
  Filled 2014-04-14: qty 2

## 2014-04-14 NOTE — Progress Notes (Signed)
Subjective: Tina Riggs was seen and examined at bedside. She is up and walking around and reports feeling much better since admission. Currently pain is down to 7/10 per patient and well controlled with pain medication. She inquired about eating given improvement in pain.  Denies nausea or vomiting at this time. We reviewed her recent lab results including +BV, Trichomonas, and Chlamydia as well. No BM but passing gas.   Objective: Vital signs in last 24 hours: Filed Vitals:   04/13/14 1619 04/13/14 1712 04/13/14 2038 04/14/14 0810  BP:  151/80 129/79 133/82  Pulse:  91 93 90  Temp: 100.4 F (38 C) 100.9 F (38.3 C) 98.7 F (37.1 C) 98.4 F (36.9 C)  TempSrc: Oral Oral Oral Oral  Resp:  22 24 21   Height:      Weight:   240 lb 4.8 oz (108.999 kg)   SpO2:  99% 100% 98%   Weight change:   Intake/Output Summary (Last 24 hours) at 04/14/14 0813 Last data filed at 04/13/14 2039  Gross per 24 hour  Intake    222 ml  Output      0 ml  Net    222 ml   Vitals reviewed. General: walking around then sitting in bed, NAD HEENT: EOMI Cardiac: RRR Pulm: clear to auscultation bilaterally, no wheezes, rales, or rhonchi Abd: soft, tenderness to palpation diffuse but mainly L side, +bs Ext: warm and well perfused, no pedal edema, moving all extremities Neuro: alert and oriented, walking without assitance  Lab Results: Basic Metabolic Panel:  Recent Labs Lab 04/13/14 1014 04/13/14 2019 04/14/14 0615  NA 138 139  --   K 4.0 3.6  --   CL 108 107  --   CO2 20 20  --   GLUCOSE 106* 84  --   BUN <5* <5*  --   CREATININE 0.86 0.83  --   CALCIUM 8.4 8.0*  --   MG  --   --  2.0  PHOS  --   --  2.8   Liver Function Tests:  Recent Labs Lab 04/13/14 1014  AST 24  ALT 15  ALKPHOS 90  BILITOT 0.6  PROT 6.4  ALBUMIN 3.5    Recent Labs Lab 04/13/14 1014  LIPASE 15   CBC:  Recent Labs Lab 04/13/14 1014  WBC 12.7*  NEUTROABS 9.5*  HGB 13.7  HCT 41.9  MCV 94.6  PLT  314   Urinalysis:  Recent Labs Lab 04/13/14 0959  COLORURINE YELLOW  LABSPEC 1.021  PHURINE 6.0  GLUCOSEU NEGATIVE  HGBUR TRACE*  BILIRUBINUR NEGATIVE  KETONESUR NEGATIVE  PROTEINUR NEGATIVE  UROBILINOGEN 1.0  NITRITE NEGATIVE  LEUKOCYTESUR NEGATIVE   Micro Results: Recent Results (from the past 240 hour(s))  Wet prep, genital     Status: Abnormal   Collection Time: 04/13/14 10:45 AM  Result Value Ref Range Status   Yeast Wet Prep HPF POC NONE SEEN NONE SEEN Final   Trich, Wet Prep FEW (A) NONE SEEN Final   Clue Cells Wet Prep HPF POC FEW (A) NONE SEEN Final   WBC, Wet Prep HPF POC FEW (A) NONE SEEN Final  MRSA PCR Screening     Status: None   Collection Time: 04/13/14 10:22 PM  Result Value Ref Range Status   MRSA by PCR NEGATIVE NEGATIVE Final    Comment:        The GeneXpert MRSA Assay (FDA approved for NASAL specimens only), is one component of a comprehensive MRSA  colonization surveillance program. It is not intended to diagnose MRSA infection nor to guide or monitor treatment for MRSA infections.    Studies/Results: Ct Abdomen Pelvis W Contrast  04/13/2014   CLINICAL DATA:  Abdominal pain  EXAM: CT ABDOMEN AND PELVIS WITH CONTRAST  TECHNIQUE: Multidetector CT imaging of the abdomen and pelvis was performed using the standard protocol following bolus administration of intravenous contrast. Oral contrast was also administered.  CONTRAST:  11mL OMNIPAQUE IOHEXOL 300 MG/ML  SOLN  COMPARISON:  July 13, 2012  FINDINGS: Lung bases are clear.  No focal liver lesions are identified. Gallbladder wall is not appreciably thickened. A tiny gallstone is seen within the gallbladder. There is no biliary duct dilatation.  Spleen, pancreas, and adrenals appear normal. Kidneys bilaterally show no mass or hydronephrosis on either side. There is no renal or ureteral calculus on either side.  In the pelvis, urinary bladder is midline with normal wall thickness. There is a cyst arising  from the left ovary measuring 4.3 x 3.5 cm. There is no other pelvic mass. There is no pelvic fluid collection.  There is inflammation in the proximal sigmoid colon near the rectosigmoid junction consistent with focal diverticulitis. No abscess is seen in this area. A tiny focus of air is noted within this collection suggesting a minimal microperforation. This tiny focus of air is seen on axial slice 53 series 2.  The appendix appears normal. There is no bowel obstruction. There is no free air beyond the tiny microperforation seen in the region of the sigmoid diverticulitis. There is no portal venous air.  There is no ascites, adenopathy, or abscess in the abdomen or pelvis. Aorta appears unremarkable. There are no blastic or lytic bone lesions.  IMPRESSION: Diverticulitis in the proximal sigmoid colon near the rectosigmoid junction with a tiny focus of microperforation. No abscess.  Cyst arising from the left ovary.  No bowel obstruction. No abscess. Appendix appears normal. No renal or ureteral calculus. No hydronephrosis.  Tiny gallstone in the gallbladder. Gallbladder wall does not appear thickened.   Electronically Signed   By: Lowella Grip III M.D.   On: 04/13/2014 12:29   Medications: I have reviewed the patient's current medications. Scheduled Meds: . antiseptic oral rinse  7 mL Mouth Rinse q12n4p  . cefTRIAXone (ROCEPHIN)  IV  1 g Intravenous Q24H  . chlorhexidine  15 mL Mouth Rinse BID  . enoxaparin (LOVENOX) injection  40 mg Subcutaneous Q24H  . folic acid  1 mg Oral Daily  . metronidazole  500 mg Intravenous Q8H  . multivitamin with minerals  1 tablet Oral Daily  . nicotine  14 mg Transdermal Daily  . thiamine  100 mg Oral Daily   Or  . thiamine  100 mg Intravenous Daily   Continuous Infusions: . sodium chloride 125 mL/hr at 04/14/14 0209   PRN Meds:.fentaNYL, LORazepam **OR** LORazepam, morphine injection Assessment/Plan: Principal Problem:   Diverticulitis of colon with  perforation Active Problems:   Bacterial vaginosis   Tobacco use   Alcohol use   Trichomonas vaginitis   Chlamydia  Sepsis in setting of recurrent acute diverticulitis--improving; afebrile, improved tachypnea, leukocytosis resolved. CT on admission significant for diverticulitis in proximal sigmoid colon near rectosigmoid junction with tiny focus of microperforation.  Pt apparently with 1st episode of diverticulitis July 2014 with no follow-up colonoscopy.  -Continue Bowel rest, NPO--may be able to tolerate ice chips, and maintenance IVF -Appreciate surgery following -Continue IV flagyl and ceftriaxone daily--day 2 -d/c fentanyl, continue morphine  prn and slowly transition to PO pain medication when able -Zofran PRN nausea  -Appreciate nutrition assistance for diverticulitis diet (low fiber on discharge transitioned to eventual high fiber diet)  -FOBT pending -Pt will need outpatient colonoscopy after resolution in 6-8 weeks to exclude malignancy  Chlamydia, recurrent Bacterial Vaginosis and Trichomonas Vaginitis - Wet prep with clue cells and few trichomonas. Neisseria gonorrhea negative. Reviewed results with patient in room today, she voiced understanding. Recommended partner needs treatment as well and she reports he is currently "locked up". She would like to proceed with treatment.  -Continue flagyl course (500 mg BID for 7 day course) -Will give 1g Azithromycin x1 -HIV Ab pending -Partner needs treatment as well -She will need to follow up with PCP as outpatient  Alcohol and Tobacco Use -CIWA protocol, FA, MVI -Place nicotine 14 mg patch daily--patch fell off, will need to replace  Diet: NPO DVT Ppx: Lovenox Code: Full   Dispo: Disposition is deferred at this time, awaiting improvement of current medical problems.  Anticipated discharge in approximately 2-3 day(s).   The patient does have a current PCP Frederico Hamman, MD) and does need an [facility] hospital follow-up  appointment after discharge.  The patient does not know have transportation limitations that hinder transportation to clinic appointments.  Services Needed at time of discharge: Y = Yes, Blank = No PT:   OT:   RN:   Equipment:   Other:     LOS: 1 day   Wilber Oliphant, MD 04/14/2014, 8:13 AM

## 2014-04-14 NOTE — Progress Notes (Signed)
Subjective: Patient appears to be more comfortable resting in bed with heating pads on abdomen. Reports feeling "gassy." Denies nausea, vomiting, or diarrhea (last BM in ED yesterday).   Objective: Vital signs in last 24 hours: Filed Vitals:   04/13/14 1619 04/13/14 1712 04/13/14 2038 04/14/14 0810  BP:  151/80 129/79 133/82  Pulse:  91 93 90  Temp: 100.4 F (38 C) 100.9 F (38.3 C) 98.7 F (37.1 C) 98.4 F (36.9 C)  TempSrc: Oral Oral Oral Oral  Resp:  22 24 21   Height:      Weight:   108.999 kg (240 lb 4.8 oz)   SpO2:  99% 100% 98%   Weight change:   Intake/Output Summary (Last 24 hours) at 04/14/14 1025 Last data filed at 04/13/14 2039  Gross per 24 hour  Intake    222 ml  Output      0 ml  Net    222 ml   General: resting in bed, no distress HEENT: PERRL, EOMI, no scleral icterus Cardiac: RRR, no rubs, murmurs or gallops Pulm: clear to auscultation bilaterally, moving normal volumes of air Abd: soft, TTP in epigastric/LLQ/LUQ, nondistended, increased BS, most notable in LLQ Ext: warm and well perfused, trace edema Neuro: alert and oriented X3, cranial nerves II-XII grossly intact  Lab Results: Basic Metabolic Panel:  Recent Labs  04/13/14 1014 04/13/14 2019 04/14/14 0615  NA 138 139  --   K 4.0 3.6  --   CL 108 107  --   CO2 20 20  --   GLUCOSE 106* 84  --   BUN <5* <5*  --   CREATININE 0.86 0.83  --   CALCIUM 8.4 8.0*  --   MG  --   --  2.0  PHOS  --   --  2.8   Liver Function Tests:  Recent Labs  04/13/14 1014  AST 24  ALT 15  ALKPHOS 90  BILITOT 0.6  PROT 6.4  ALBUMIN 3.5    Recent Labs  04/13/14 1014  LIPASE 15   CBC:  Recent Labs  04/13/14 1014 04/14/14 0615  WBC 12.7* 10.2  NEUTROABS 9.5*  --   HGB 13.7 12.1  HCT 41.9 37.3  MCV 94.6 94.7  PLT 314 285   Urinalysis:  Recent Labs  04/13/14 0959  COLORURINE YELLOW  LABSPEC 1.021  PHURINE 6.0  GLUCOSEU NEGATIVE  HGBUR TRACE*  BILIRUBINUR NEGATIVE  KETONESUR  NEGATIVE  PROTEINUR NEGATIVE  UROBILINOGEN 1.0  NITRITE NEGATIVE  LEUKOCYTESUR NEGATIVE     Micro Results: Recent Results (from the past 240 hour(s))  Wet prep, genital     Status: Abnormal   Collection Time: 04/13/14 10:45 AM  Result Value Ref Range Status   Yeast Wet Prep HPF POC NONE SEEN NONE SEEN Final   Trich, Wet Prep FEW (A) NONE SEEN Final   Clue Cells Wet Prep HPF POC FEW (A) NONE SEEN Final   WBC, Wet Prep HPF POC FEW (A) NONE SEEN Final  MRSA PCR Screening     Status: None   Collection Time: 04/13/14 10:22 PM  Result Value Ref Range Status   MRSA by PCR NEGATIVE NEGATIVE Final    Comment:        The GeneXpert MRSA Assay (FDA approved for NASAL specimens only), is one component of a comprehensive MRSA colonization surveillance program. It is not intended to diagnose MRSA infection nor to guide or monitor treatment for MRSA infections.    Studies/Results: Ct  Abdomen Pelvis W Contrast  04/13/2014   CLINICAL DATA:  Abdominal pain  EXAM: CT ABDOMEN AND PELVIS WITH CONTRAST  TECHNIQUE: Multidetector CT imaging of the abdomen and pelvis was performed using the standard protocol following bolus administration of intravenous contrast. Oral contrast was also administered.  CONTRAST:  125mL OMNIPAQUE IOHEXOL 300 MG/ML  SOLN  COMPARISON:  July 13, 2012  FINDINGS: Lung bases are clear.  No focal liver lesions are identified. Gallbladder wall is not appreciably thickened. A tiny gallstone is seen within the gallbladder. There is no biliary duct dilatation.  Spleen, pancreas, and adrenals appear normal. Kidneys bilaterally show no mass or hydronephrosis on either side. There is no renal or ureteral calculus on either side.  In the pelvis, urinary bladder is midline with normal wall thickness. There is a cyst arising from the left ovary measuring 4.3 x 3.5 cm. There is no other pelvic mass. There is no pelvic fluid collection.  There is inflammation in the proximal sigmoid colon near  the rectosigmoid junction consistent with focal diverticulitis. No abscess is seen in this area. A tiny focus of air is noted within this collection suggesting a minimal microperforation. This tiny focus of air is seen on axial slice 53 series 2.  The appendix appears normal. There is no bowel obstruction. There is no free air beyond the tiny microperforation seen in the region of the sigmoid diverticulitis. There is no portal venous air.  There is no ascites, adenopathy, or abscess in the abdomen or pelvis. Aorta appears unremarkable. There are no blastic or lytic bone lesions.  IMPRESSION: Diverticulitis in the proximal sigmoid colon near the rectosigmoid junction with a tiny focus of microperforation. No abscess.  Cyst arising from the left ovary.  No bowel obstruction. No abscess. Appendix appears normal. No renal or ureteral calculus. No hydronephrosis.  Tiny gallstone in the gallbladder. Gallbladder wall does not appear thickened.   Electronically Signed   By: Lowella Grip III M.D.   On: 04/13/2014 12:29   Medications: I have reviewed the patient's current medications. Scheduled Meds: . antiseptic oral rinse  7 mL Mouth Rinse q12n4p  . azithromycin  1,000 mg Oral Once  . cefTRIAXone (ROCEPHIN)  IV  1 g Intravenous Q24H  . chlorhexidine  15 mL Mouth Rinse BID  . enoxaparin (LOVENOX) injection  40 mg Subcutaneous Q24H  . folic acid  1 mg Oral Daily  . metronidazole  500 mg Intravenous Q8H  . multivitamin with minerals  1 tablet Oral Daily  . nicotine  14 mg Transdermal Daily  . thiamine  100 mg Oral Daily   Or  . thiamine  100 mg Intravenous Daily   Continuous Infusions: . sodium chloride 125 mL/hr at 04/14/14 0209   PRN Meds:.fentaNYL, LORazepam **OR** LORazepam, morphine injection Assessment/Plan: Principal Problem:   Diverticulitis of colon with perforation Active Problems:   Bacterial vaginosis   Tobacco use   Alcohol use   Trichomonas vaginitis   Chlamydia  Diverticulitis  with micro perforation: Abdominal pain somewhat improved. Abdomen TTP worse in LUQ and LLQ. No rebound or guarding. Patient endorses gas but denies BM since 4/11 on admission in ED. Requesting suppository. Leukocytosis now resolved (10.2 from 12.7 on admission). - surgery following, appreciate recs - no suppository or laxatives while on bowel rest - d/c IV fentanyl 45mcg q2hr prn for pain - IV morphine 2mg  q4hr prn for pain - NPO until 4/13AM, then advance to clear liquid diet - IV ceftriaxone 1g q24 hours/ IV  metronidazole 500mg  q8 hours, consider switch to levofloxicin if no improvement (avoid PCN due to allergy)  - IVF NS @ 125cc/hr - IV ondansetron 4mg  q8hrs prn for nausea  BV & trichominiasis: Clue cells and few trich detected on wet prep. - 7-day course of metronidazole 500mg  BID   Chlamydia:  Patient reports having had chlamydia in 2008. Denies any urinary sx or vaginal discharge. Patient reports having sought testing two months ago but not following up. She is upset by the news of this diagnosis.  - azithromycin 1000mg  one-time - offer expedited partner therapy on d/c  Alcohol use: Reports 40-120oz beer/daily (variable report by provider). Received one dose lorazepam overnight for what she describes as some minor tremulousness in left hand per nursing assessment. Denies any feelings of anxiety. Non-tremulous on exam.  - continue CIWA protocol  Tobacco use: 3/4 PPD x 9 years.  - con't nicotine patch  Dispo: Disposition is deferred at this time, awaiting improvement of current medical problems. Anticipated discharge in approximately 2 day(s).   The patient does have a current PCP Frederico Hamman, MD) and does need an [facility] hospital follow-up appointment after discharge.  The patient does not have transportation limitations that hinder transportation to clinic appointments. .Services Needed at time of discharge: Y = Yes, Blank = No PT:   OT:   RN:   Equipment:   Other:      LOS: 1 day   Carolan Shiver, Med Student 04/14/2014, 10:25 AM

## 2014-04-14 NOTE — Care Management Note (Signed)
CARE MANAGEMENT NOTE 04/14/2014  Patient:  Tina Riggs, Tina Riggs   Account Number:  [id number]  Date Initiated:  04/14/2014  Documentation initiated by:  Reynoldo Mainer  Subjective/Objective Assessment:   CM following for progression and d/c planning.     Action/Plan:   Noted request for medication asssitance, however this pt has insurance and no chronic issues noted that require ongoing medications.   Anticipated DC Date:  04/16/2014   Anticipated DC Plan:  HOME/SELF CARE         Choice offered to / List presented to:             Status of service:  In process, will continue to follow Medicare Important Message given?  NO (If response is "NO", the following Medicare IM given date fields will be blank) Date Medicare IM given:   Medicare IM given by:   Date Additional Medicare IM given:   Additional Medicare IM given by:    Discharge Disposition:    Per UR Regulation:    If discussed at Long Length of Stay Meetings, dates discussed:    Comments:

## 2014-04-14 NOTE — Progress Notes (Signed)
Patient ID: Tina Riggs, female   DOB: 1980/11/12, 34 y.o.   MRN: [id number]     [facility]      [address] [place], [place] [address]    Phone: [phone] FAX: [phone]     Subjective: WBC normal.  VSS.  Febrile, T max 100.9.  C/o constipation, passing flatus.   Objective:  Vital signs:  Filed Vitals:   04/13/14 1619 04/13/14 1712 04/13/14 2038 04/14/14 0810  BP:  151/80 129/79 133/82  Pulse:  91 93 90  Temp: 100.4 F (38 C) 100.9 F (38.3 C) 98.7 F (37.1 C) 98.4 F (36.9 C)  TempSrc: Oral Oral Oral Oral  Resp:  $Remo'22 24 21  'pQDsn$ Height:      Weight:   108.999 kg (240 lb 4.8 oz)   SpO2:  99% 100% 98%    Last BM Date: 04/12/14  Intake/Output   Yesterday:  04/11 0701 - 04/12 0700 In: 222 [P.O.:222] Out: -  This shift:    I/O last 3 completed shifts: In: 68 [P.O.:222] Out: -     Physical Exam: General: Pt awake/alert/oriented x4 in no acute distress  Abdomen: Soft.  Nondistended.  Moderate TTP to left abdomen.  No evidence of peritonitis.  No incarcerated hernias.    Problem List:   Principal Problem:   Diverticulitis of colon with perforation Active Problems:   Bacterial vaginosis   Tobacco use   Alcohol use   Trichomonas vaginitis   Chlamydia    Results:   Labs: Results for orders placed or performed during the hospital encounter of 04/13/14 (from the past 48 hour(s))  GC/Chlamydia probe amp ([place])     Status: Abnormal   Collection Time: 04/13/14 12:00 AM  Result Value Ref Range   Chlamydia **POSITIVE** (A)     Comment: Normal Reference Range - Negative   Neisseria gonorrhea Negative     Comment: Normal Reference Range - Negative  Urinalysis, Routine w reflex microscopic     Status: Abnormal   Collection Time: 04/13/14  9:59 AM  Result Value Ref Range   Color, Urine YELLOW YELLOW   APPearance CLEAR CLEAR   Specific Gravity, Urine 1.021 1.005 - 1.030   pH 6.0 5.0 - 8.0    Glucose, UA NEGATIVE NEGATIVE mg/dL   Hgb urine dipstick TRACE (A) NEGATIVE   Bilirubin Urine NEGATIVE NEGATIVE   Ketones, ur NEGATIVE NEGATIVE mg/dL   Protein, ur NEGATIVE NEGATIVE mg/dL   Urobilinogen, UA 1.0 0.0 - 1.0 mg/dL   Nitrite NEGATIVE NEGATIVE   Leukocytes, UA NEGATIVE NEGATIVE  Urine microscopic-add on     Status: Abnormal   Collection Time: 04/13/14  9:59 AM  Result Value Ref Range   Squamous Epithelial / LPF FEW (A) RARE   WBC, UA 0-2 <3 WBC/hpf   RBC / HPF 3-6 <3 RBC/hpf   Bacteria, UA FEW (A) RARE  CBC with Differential     Status: Abnormal   Collection Time: 04/13/14 10:14 AM  Result Value Ref Range   WBC 12.7 (H) 4.0 - 10.5 K/uL   RBC 4.43 3.87 - 5.11 MIL/uL   Hemoglobin 13.7 12.0 - 15.0 g/dL   HCT 41.9 36.0 - 46.0 %   MCV 94.6 78.0 - 100.0 fL   MCH 30.9 26.0 - 34.0 pg   MCHC 32.7 30.0 - 36.0 g/dL   RDW 14.1 11.5 - 15.5 %   Platelets 314 150 - 400 K/uL   Neutrophils Relative %  75 43 - 77 %   Neutro Abs 9.5 (H) 1.7 - 7.7 K/uL   Lymphocytes Relative 18 12 - 46 %   Lymphs Abs 2.3 0.7 - 4.0 K/uL   Monocytes Relative 7 3 - 12 %   Monocytes Absolute 0.8 0.1 - 1.0 K/uL   Eosinophils Relative 0 0 - 5 %   Eosinophils Absolute 0.0 0.0 - 0.7 K/uL   Basophils Relative 0 0 - 1 %   Basophils Absolute 0.0 0.0 - 0.1 K/uL  Comprehensive metabolic panel     Status: Abnormal   Collection Time: 04/13/14 10:14 AM  Result Value Ref Range   Sodium 138 135 - 145 mmol/L   Potassium 4.0 3.5 - 5.1 mmol/L   Chloride 108 96 - 112 mmol/L   CO2 20 19 - 32 mmol/L   Glucose, Bld 106 (H) 70 - 99 mg/dL   BUN <5 (L) 6 - 23 mg/dL   Creatinine, Ser 0.86 0.50 - 1.10 mg/dL   Calcium 8.4 8.4 - 10.5 mg/dL   Total Protein 6.4 6.0 - 8.3 g/dL   Albumin 3.5 3.5 - 5.2 g/dL   AST 24 0 - 37 U/L   ALT 15 0 - 35 U/L   Alkaline Phosphatase 90 39 - 117 U/L   Total Bilirubin 0.6 0.3 - 1.2 mg/dL   GFR calc non Af Amer 89 (L) >90 mL/min   GFR calc Af Amer >90 >90 mL/min    Comment: (NOTE) The eGFR  has been calculated using the CKD EPI equation. This calculation has not been validated in all clinical situations. eGFR's persistently <90 mL/min signify possible Chronic Kidney Disease.    Anion gap 10 5 - 15  Lipase, blood     Status: None   Collection Time: 04/13/14 10:14 AM  Result Value Ref Range   Lipase 15 11 - 59 U/L  POC Urine Pregnancy, ED  (If Pre-menopausal female) - do not order at [facility]     Status: None   Collection Time: 04/13/14 10:24 AM  Result Value Ref Range   Preg Test, Ur NEGATIVE NEGATIVE    Comment:        THE SENSITIVITY OF THIS METHODOLOGY IS >24 mIU/mL   Wet prep, genital     Status: Abnormal   Collection Time: 04/13/14 10:45 AM  Result Value Ref Range   Yeast Wet Prep HPF POC NONE SEEN NONE SEEN   Trich, Wet Prep FEW (A) NONE SEEN   Clue Cells Wet Prep HPF POC FEW (A) NONE SEEN   WBC, Wet Prep HPF POC FEW (A) NONE SEEN  Basic metabolic panel     Status: Abnormal   Collection Time: 04/13/14  8:19 PM  Result Value Ref Range   Sodium 139 135 - 145 mmol/L   Potassium 3.6 3.5 - 5.1 mmol/L   Chloride 107 96 - 112 mmol/L   CO2 20 19 - 32 mmol/L   Glucose, Bld 84 70 - 99 mg/dL   BUN <5 (L) 6 - 23 mg/dL   Creatinine, Ser 0.83 0.50 - 1.10 mg/dL   Calcium 8.0 (L) 8.4 - 10.5 mg/dL   GFR calc non Af Amer >90 >90 mL/min   GFR calc Af Amer >90 >90 mL/min    Comment: (NOTE) The eGFR has been calculated using the CKD EPI equation. This calculation has not been validated in all clinical situations. eGFR's persistently <90 mL/min signify possible Chronic Kidney Disease.    Anion gap 12 5 - 15  MRSA PCR Screening     Status: None   Collection Time: 04/13/14 10:22 PM  Result Value Ref Range   MRSA by PCR NEGATIVE NEGATIVE    Comment:        The GeneXpert MRSA Assay (FDA approved for NASAL specimens only), is one component of a comprehensive MRSA colonization surveillance program. It is not intended to diagnose MRSA infection nor to guide or monitor  treatment for MRSA infections.   CBC     Status: None   Collection Time: 04/14/14  6:15 AM  Result Value Ref Range   WBC 10.2 4.0 - 10.5 K/uL   RBC 3.94 3.87 - 5.11 MIL/uL   Hemoglobin 12.1 12.0 - 15.0 g/dL   HCT 37.3 36.0 - 46.0 %   MCV 94.7 78.0 - 100.0 fL   MCH 30.7 26.0 - 34.0 pg   MCHC 32.4 30.0 - 36.0 g/dL   RDW 14.2 11.5 - 15.5 %   Platelets 285 150 - 400 K/uL  Magnesium     Status: None   Collection Time: 04/14/14  6:15 AM  Result Value Ref Range   Magnesium 2.0 1.5 - 2.5 mg/dL  Phosphorus     Status: None   Collection Time: 04/14/14  6:15 AM  Result Value Ref Range   Phosphorus 2.8 2.3 - 4.6 mg/dL    Imaging / Studies: Ct Abdomen Pelvis W Contrast  04/13/2014   CLINICAL DATA:  Abdominal pain  EXAM: CT ABDOMEN AND PELVIS WITH CONTRAST  TECHNIQUE: Multidetector CT imaging of the abdomen and pelvis was performed using the standard protocol following bolus administration of intravenous contrast. Oral contrast was also administered.  CONTRAST:  174mL OMNIPAQUE IOHEXOL 300 MG/ML  SOLN  COMPARISON:  July 13, 2012  FINDINGS: Lung bases are clear.  No focal liver lesions are identified. Gallbladder wall is not appreciably thickened. A tiny gallstone is seen within the gallbladder. There is no biliary duct dilatation.  Spleen, pancreas, and adrenals appear normal. Kidneys bilaterally show no mass or hydronephrosis on either side. There is no renal or ureteral calculus on either side.  In the pelvis, urinary bladder is midline with normal wall thickness. There is a cyst arising from the left ovary measuring 4.3 x 3.5 cm. There is no other pelvic mass. There is no pelvic fluid collection.  There is inflammation in the proximal sigmoid colon near the rectosigmoid junction consistent with focal diverticulitis. No abscess is seen in this area. A tiny focus of air is noted within this collection suggesting a minimal microperforation. This tiny focus of air is seen on axial slice 53 series 2.  The  appendix appears normal. There is no bowel obstruction. There is no free air beyond the tiny microperforation seen in the region of the sigmoid diverticulitis. There is no portal venous air.  There is no ascites, adenopathy, or abscess in the abdomen or pelvis. Aorta appears unremarkable. There are no blastic or lytic bone lesions.  IMPRESSION: Diverticulitis in the proximal sigmoid colon near the rectosigmoid junction with a tiny focus of microperforation. No abscess.  Cyst arising from the left ovary.  No bowel obstruction. No abscess. Appendix appears normal. No renal or ureteral calculus. No hydronephrosis.  Tiny gallstone in the gallbladder. Gallbladder wall does not appear thickened.   Electronically Signed   By: Lowella Grip III M.D.   On: 04/13/2014 12:29    Medications / Allergies:  Scheduled Meds: . antiseptic oral rinse  7 mL Mouth Rinse q12n4p  .  azithromycin  1,000 mg Oral Once  . cefTRIAXone (ROCEPHIN)  IV  1 g Intravenous Q24H  . chlorhexidine  15 mL Mouth Rinse BID  . enoxaparin (LOVENOX) injection  40 mg Subcutaneous Q24H  . folic acid  1 mg Oral Daily  . metronidazole  500 mg Intravenous Q8H  . multivitamin with minerals  1 tablet Oral Daily  . nicotine  14 mg Transdermal Daily  . thiamine  100 mg Oral Daily   Or  . thiamine  100 mg Intravenous Daily   Continuous Infusions: . sodium chloride 125 mL/hr at 04/14/14 0209   PRN Meds:.fentaNYL, LORazepam **OR** LORazepam, morphine injection  Antibiotics: Anti-infectives    Start     Dose/Rate Route Frequency Ordered Stop   04/14/14 1000  azithromycin (ZITHROMAX) tablet 1,000 mg     1,000 mg Oral  Once 04/14/14 0837     04/13/14 2300  metroNIDAZOLE (FLAGYL) IVPB 500 mg     500 mg 100 mL/hr over 60 Minutes Intravenous Every 8 hours 04/13/14 1502     04/13/14 1515  cefTRIAXone (ROCEPHIN) 1 g in dextrose 5 % 50 mL IVPB - Premix     1 g 100 mL/hr over 30 Minutes Intravenous Every 24 hours 04/13/14 1502     04/13/14 1315   metroNIDAZOLE (FLAGYL) IVPB 500 mg     500 mg 100 mL/hr over 60 Minutes Intravenous  Once 04/13/14 1314 04/13/14 1514   04/13/14 1315  ciprofloxacin (CIPRO) IVPB 400 mg  Status:  Discontinued     400 mg 200 mL/hr over 60 Minutes Intravenous  Once 04/13/14 1314 04/13/14 1502        Assessment/Plan HD#1 Sigmoid diverticulitis with microperforation -continue with bowel rest and non operative management. Still moderately tender.  Hopefully we can avoid surgery in the acute setting, colonoscopy 6-8 weeks, then one stage resection. -rocephin/flagyl -pain control -mobilize -SCD/lovenox   Erby Pian, ANP-BC [facility] Pager [phone](7A-4:30P) For consults and floor pages call [phone](7A-4:30P)  04/14/2014 10:15 AM

## 2014-04-14 NOTE — Plan of Care (Signed)
Problem: Food- and Nutrition-Related Knowledge Deficit (NB-1.1) Goal: Nutrition education Formal process to instruct or train a patient/client in a skill or to impart knowledge to help patients/clients voluntarily manage or modify food choices and eating behavior to maintain or improve health. Outcome: Completed/Met Date Met:  04/14/14 RD consulted for a diet education regarding a diverticulitis diet.  Pt was given "Low Fiber Nutrition Therapy" handout from the Academy of Nutrition and Dietetics Manual. Discussed being on a low fiber diet after discharge and eventually transitioning to a high fiber diet over several weeks. A list of foods not recommended and recommended were reviewed. Teach back method used. Pt questions were appropriately answered. RD contact information given.  Expect good compliance.  Pt is currently NPO. Labs and medications reviewed. No further nutrition interventions at this time. If nutritional issues arise, please re-consult RD.  Kallie Locks, MS, RD, LDN Pager # [phone] After hours/ weekend pager # [phone]

## 2014-04-15 LAB — BASIC METABOLIC PANEL
Anion gap: 6 (ref 5–15)
CO2: 22 mmol/L (ref 19–32)
Calcium: 8 mg/dL — ABNORMAL LOW (ref 8.4–10.5)
Chloride: 108 mmol/L (ref 96–112)
Creatinine, Ser: 0.8 mg/dL (ref 0.50–1.10)
Glucose, Bld: 82 mg/dL (ref 70–99)
POTASSIUM: 3.5 mmol/L (ref 3.5–5.1)
SODIUM: 136 mmol/L (ref 135–145)

## 2014-04-15 LAB — CBC
HCT: 37 % (ref 36.0–46.0)
Hemoglobin: 12 g/dL (ref 12.0–15.0)
MCH: 30.8 pg (ref 26.0–34.0)
MCHC: 32.4 g/dL (ref 30.0–36.0)
MCV: 94.9 fL (ref 78.0–100.0)
PLATELETS: 251 10*3/uL (ref 150–400)
RBC: 3.9 MIL/uL (ref 3.87–5.11)
RDW: 14.1 % (ref 11.5–15.5)
WBC: 9.8 10*3/uL (ref 4.0–10.5)

## 2014-04-15 MED ORDER — FENTANYL CITRATE 0.05 MG/ML IJ SOLN
12.5000 ug | Freq: Once | INTRAMUSCULAR | Status: AC
  Administered 2014-04-15: 12.5 ug via INTRAVENOUS
  Filled 2014-04-15: qty 2

## 2014-04-15 MED ORDER — FENTANYL CITRATE 0.05 MG/ML IJ SOLN
12.5000 ug | INTRAMUSCULAR | Status: DC | PRN
  Administered 2014-04-15 (×2): 12.5 ug via INTRAVENOUS
  Filled 2014-04-15 (×2): qty 2

## 2014-04-15 MED ORDER — METRONIDAZOLE IN NACL 5-0.79 MG/ML-% IV SOLN
500.0000 mg | Freq: Two times a day (BID) | INTRAVENOUS | Status: AC
  Administered 2014-04-15 – 2014-04-19 (×9): 500 mg via INTRAVENOUS
  Filled 2014-04-15 (×9): qty 100

## 2014-04-15 MED ORDER — FENTANYL CITRATE 0.05 MG/ML IJ SOLN
12.5000 ug | INTRAMUSCULAR | Status: DC | PRN
  Administered 2014-04-15 – 2014-04-16 (×4): 12.5 ug via INTRAVENOUS
  Filled 2014-04-15 (×12): qty 2

## 2014-04-15 MED ORDER — SODIUM CHLORIDE 0.9 % IV SOLN
1.0000 g | INTRAVENOUS | Status: DC
  Administered 2014-04-15 – 2014-04-19 (×5): 1 g via INTRAVENOUS
  Filled 2014-04-15 (×6): qty 1

## 2014-04-15 NOTE — Progress Notes (Signed)
Dr. Jalene Mullet notified of temp of 100.7. MD bedside to evaluated and answer additional pt questions. No new orders at this time.

## 2014-04-15 NOTE — Progress Notes (Signed)
Subjective: Patient resting in bed. Reports nightmares and HA with morphine overnight. Pressure/sharp pain still left-sided and steady if not worse from yesterday. Reports heating pads are no longer providing relief. Had a BM yesterday, loose, denies BRBPR. Feels hot and sweaty at night.   Objective: Vital signs in last 24 hours: Filed Vitals:   04/14/14 0810 04/14/14 1604 04/14/14 2051 04/15/14 0425  BP: 133/82 133/90 142/96 121/75  Pulse: 90 94 102 92  Temp: 98.4 F (36.9 C) 99.8 F (37.7 C) 100.7 F (38.2 C) 97.6 F (36.4 C)  TempSrc: Oral Oral Oral Oral  Resp: 21 21 18 22   Height:      Weight:      SpO2: 98% 100% 100% 96%   Weight change:   Intake/Output Summary (Last 24 hours) at 04/15/14 0754 Last data filed at 04/15/14 0600  Gross per 24 hour  Intake 1423.75 ml  Output      0 ml  Net 1423.75 ml   General: resting in bed, no acute distress HEENT: PERRL, EOMI, no scleral icterus Cardiac: RRR, no rubs, murmurs or gallops Pulm: clear to auscultation bilaterally, moving normal volumes of air Abd: soft, nondistended, decreased BS, TTP in epigastric and LLQ and LUQ, most notable in LUQ, voluntary guarding, no rebound Ext: warm and well perfused, no pedal edema Neuro: alert and oriented X3, cranial nerves II-XII grossly intact  Lab Results: Basic Metabolic Panel:  Recent Labs  04/13/14 2019 04/14/14 0615 04/15/14 0453  NA 139  --  136  K 3.6  --  3.5  CL 107  --  108  CO2 20  --  22  GLUCOSE 84  --  82  BUN <5*  --  <5*  CREATININE 0.83  --  0.80  CALCIUM 8.0*  --  8.0*  MG  --  2.0  --   PHOS  --  2.8  --    Liver Function Tests:  Recent Labs  04/13/14 1014  AST 24  ALT 15  ALKPHOS 90  BILITOT 0.6  PROT 6.4  ALBUMIN 3.5    Recent Labs  04/13/14 1014  LIPASE 15   CBC:  Recent Labs  04/13/14 1014 04/14/14 0615 04/15/14 0453  WBC 12.7* 10.2 9.8  NEUTROABS 9.5*  --   --   HGB 13.7 12.1 12.0  HCT 41.9 37.3 37.0  MCV 94.6 94.7 94.9    PLT 314 285 251   Urinalysis:  Recent Labs  04/13/14 0959  COLORURINE YELLOW  LABSPEC 1.021  PHURINE 6.0  GLUCOSEU NEGATIVE  HGBUR TRACE*  BILIRUBINUR NEGATIVE  KETONESUR NEGATIVE  PROTEINUR NEGATIVE  UROBILINOGEN 1.0  NITRITE NEGATIVE  LEUKOCYTESUR NEGATIVE   Micro Results: Recent Results (from the past 240 hour(s))  Wet prep, genital     Status: Abnormal   Collection Time: 04/13/14 10:45 AM  Result Value Ref Range Status   Yeast Wet Prep HPF POC NONE SEEN NONE SEEN Final   Trich, Wet Prep FEW (A) NONE SEEN Final   Clue Cells Wet Prep HPF POC FEW (A) NONE SEEN Final   WBC, Wet Prep HPF POC FEW (A) NONE SEEN Final  MRSA PCR Screening     Status: None   Collection Time: 04/13/14 10:22 PM  Result Value Ref Range Status   MRSA by PCR NEGATIVE NEGATIVE Final    Comment:        The GeneXpert MRSA Assay (FDA approved for NASAL specimens only), is one component of a comprehensive MRSA colonization  surveillance program. It is not intended to diagnose MRSA infection nor to guide or monitor treatment for MRSA infections.    Studies/Results: Ct Abdomen Pelvis W Contrast  04/13/2014   CLINICAL DATA:  Abdominal pain  EXAM: CT ABDOMEN AND PELVIS WITH CONTRAST  TECHNIQUE: Multidetector CT imaging of the abdomen and pelvis was performed using the standard protocol following bolus administration of intravenous contrast. Oral contrast was also administered.  CONTRAST:  12mL OMNIPAQUE IOHEXOL 300 MG/ML  SOLN  COMPARISON:  July 13, 2012  FINDINGS: Lung bases are clear.  No focal liver lesions are identified. Gallbladder wall is not appreciably thickened. A tiny gallstone is seen within the gallbladder. There is no biliary duct dilatation.  Spleen, pancreas, and adrenals appear normal. Kidneys bilaterally show no mass or hydronephrosis on either side. There is no renal or ureteral calculus on either side.  In the pelvis, urinary bladder is midline with normal wall thickness. There is a  cyst arising from the left ovary measuring 4.3 x 3.5 cm. There is no other pelvic mass. There is no pelvic fluid collection.  There is inflammation in the proximal sigmoid colon near the rectosigmoid junction consistent with focal diverticulitis. No abscess is seen in this area. A tiny focus of air is noted within this collection suggesting a minimal microperforation. This tiny focus of air is seen on axial slice 53 series 2.  The appendix appears normal. There is no bowel obstruction. There is no free air beyond the tiny microperforation seen in the region of the sigmoid diverticulitis. There is no portal venous air.  There is no ascites, adenopathy, or abscess in the abdomen or pelvis. Aorta appears unremarkable. There are no blastic or lytic bone lesions.  IMPRESSION: Diverticulitis in the proximal sigmoid colon near the rectosigmoid junction with a tiny focus of microperforation. No abscess.  Cyst arising from the left ovary.  No bowel obstruction. No abscess. Appendix appears normal. No renal or ureteral calculus. No hydronephrosis.  Tiny gallstone in the gallbladder. Gallbladder wall does not appear thickened.   Electronically Signed   By: Lowella Grip III M.D.   On: 04/13/2014 12:29   Medications: I have reviewed the patient's current medications. Scheduled Meds: . antiseptic oral rinse  7 mL Mouth Rinse q12n4p  . cefTRIAXone (ROCEPHIN)  IV  1 g Intravenous Q24H  . chlorhexidine  15 mL Mouth Rinse BID  . enoxaparin (LOVENOX) injection  40 mg Subcutaneous Q24H  . folic acid  1 mg Oral Daily  . metronidazole  500 mg Intravenous Q8H  . multivitamin with minerals  1 tablet Oral Daily  . nicotine  14 mg Transdermal Daily  . thiamine  100 mg Oral Daily   Or  . thiamine  100 mg Intravenous Daily   Continuous Infusions: . dextrose 5 % and 0.9% NaCl 75 mL/hr at 04/15/14 0400   PRN Meds:.LORazepam **OR** LORazepam, morphine injection, ondansetron (ZOFRAN) IV Assessment/Plan: Principal  Problem:   Diverticulitis of colon with perforation Active Problems:   Bacterial vaginosis   Tobacco use   Alcohol use   Chlamydia   Trichomonal vaginitis  Diverticulitis with micro perforation: Febrile overnight, no other signs of sepsis (no tachycardia, tachypnea, normal WBC). Blood cultures collected. Abdominal pain worse from 4/12, improved since admission overall. Reports IV morphine was ineffective. Abdomen TTP worse in LUQ and LLQ. Voluntary guarding, no rebound. Patient endorses BM yesterday 4/12, loose. No CT scan now, will consider in 24-48 hours. May need surgical intervention if no improvement in  next few days.  - surgery following, appreciate recs - f/u blood cultures - no suppository or laxatives while on bowel rest - d/c IV morphine 2mg  - IV fentanyl 24mcg q6hrs prn for pain -  NPO - switching to Invanz from ceftriaxone/metronidazole 2/2 failure to improve.  - Maintenance fluids D5NS @ 75cc/hr - IV ondansetron 4mg  q8hrs prn for nausea - will need colonscopy 6-8 weeks after d/c  BV & trichominiasis: Clue cells and few trich detected on wet prep. Day 3 of abx. - PO metronidazole 500mg  BID x7 days   Chlamydia: Patient reports having had chlamydia in 2008. Denies any urinary sx or vaginal discharge. Patient reports having sought testing two months ago but not following up. She is upset by the news of this diagnosis.  - offer expedited partner therapy on d/c  Alcohol use: Reports 40-120oz beer/daily (variable report by provider). Received one dose lorazepam overnight.  - continue CIWA protocol  Tobacco use: 3/4 PPD x 9 years.  - con't nicotine patch  Dispo: Disposition is deferred at this time, awaiting improvement of current medical problems. Anticipated discharge in approximately 2 day(s).   The patient does have a current PCP Frederico Hamman, MD) and does need an [facility] hospital follow-up appointment after discharge.  The patient does not have transportation  limitations that hinder transportation to clinic appointments. .Services Needed at time of discharge: Y = Yes, Blank = No PT:   OT:   RN:   Equipment:   Other:     LOS: 2 days   Carolan Shiver, Med Student 04/15/2014, 7:54 AM

## 2014-04-15 NOTE — Progress Notes (Signed)
Patient ID: Tina Riggs, female   DOB: [birth year], 34 y.o.   MRN: [id number]     [facility]      [address] [place], [place] [address]    Phone: [phone] FAX: [phone]     Subjective: BM yesterday.  Low grade temps.  Worsening pain.    Objective:  Vital signs:  Filed Vitals:   04/14/14 1604 04/14/14 2051 04/15/14 0425 04/15/14 0807  BP: 133/90 142/96 121/75 136/91  Pulse: 94 102 92 90  Temp: 99.8 F (37.7 C) 100.7 F (38.2 C) 97.6 F (36.4 C) 99.5 F (37.5 C)  TempSrc: Oral Oral Oral Oral  Resp: $Remo'21 18 22 24  'BwKqv$ Height:      Weight:      SpO2: 100% 100% 96% 98%    Last BM Date: 04/14/14  Intake/Output   Yesterday:  04/12 0701 - 04/13 0700 In: 1423.8 [I.V.:923.8; IV Piggyback:500] Out: -  This shift:    I/O last 3 completed shifts: In: 1645.8 [P.O.:222; I.V.:923.8; IV Piggyback:500] Out: -    Physical Exam: General: Pt awake/alert/oriented x4 in no acute distress Abdomen: Soft.  Nondistended. Voluntary guarding, generalized tenderness.   No incarcerated hernias.   Problem List:   Principal Problem:   Diverticulitis of colon with perforation Active Problems:   Bacterial vaginosis   Tobacco use   Alcohol use   Chlamydia   Trichomonal vaginitis    Results:   Labs: Results for orders placed or performed during the hospital encounter of 04/13/14 (from the past 48 hour(s))  Urinalysis, Routine w reflex microscopic     Status: Abnormal   Collection Time: 04/13/14  9:59 AM  Result Value Ref Range   Color, Urine YELLOW YELLOW   APPearance CLEAR CLEAR   Specific Gravity, Urine 1.021 1.005 - 1.030   pH 6.0 5.0 - 8.0   Glucose, UA NEGATIVE NEGATIVE mg/dL   Hgb urine dipstick TRACE (A) NEGATIVE   Bilirubin Urine NEGATIVE NEGATIVE   Ketones, ur NEGATIVE NEGATIVE mg/dL   Protein, ur NEGATIVE NEGATIVE mg/dL   Urobilinogen, UA 1.0 0.0 - 1.0 mg/dL   Nitrite NEGATIVE NEGATIVE   Leukocytes, UA NEGATIVE  NEGATIVE  Urine microscopic-add on     Status: Abnormal   Collection Time: 04/13/14  9:59 AM  Result Value Ref Range   Squamous Epithelial / LPF FEW (A) RARE   WBC, UA 0-2 <3 WBC/hpf   RBC / HPF 3-6 <3 RBC/hpf   Bacteria, UA FEW (A) RARE  CBC with Differential     Status: Abnormal   Collection Time: 04/13/14 10:14 AM  Result Value Ref Range   WBC 12.7 (H) 4.0 - 10.5 K/uL   RBC 4.43 3.87 - 5.11 MIL/uL   Hemoglobin 13.7 12.0 - 15.0 g/dL   HCT 41.9 36.0 - 46.0 %   MCV 94.6 78.0 - 100.0 fL   MCH 30.9 26.0 - 34.0 pg   MCHC 32.7 30.0 - 36.0 g/dL   RDW 14.1 11.5 - 15.5 %   Platelets 314 150 - 400 K/uL   Neutrophils Relative % 75 43 - 77 %   Neutro Abs 9.5 (H) 1.7 - 7.7 K/uL   Lymphocytes Relative 18 12 - 46 %   Lymphs Abs 2.3 0.7 - 4.0 K/uL   Monocytes Relative 7 3 - 12 %   Monocytes Absolute 0.8 0.1 - 1.0 K/uL   Eosinophils Relative 0 0 - 5 %   Eosinophils Absolute 0.0 0.0 -  0.7 K/uL   Basophils Relative 0 0 - 1 %   Basophils Absolute 0.0 0.0 - 0.1 K/uL  Comprehensive metabolic panel     Status: Abnormal   Collection Time: 04/13/14 10:14 AM  Result Value Ref Range   Sodium 138 135 - 145 mmol/L   Potassium 4.0 3.5 - 5.1 mmol/L   Chloride 108 96 - 112 mmol/L   CO2 20 19 - 32 mmol/L   Glucose, Bld 106 (H) 70 - 99 mg/dL   BUN <5 (L) 6 - 23 mg/dL   Creatinine, Ser 0.86 0.50 - 1.10 mg/dL   Calcium 8.4 8.4 - 10.5 mg/dL   Total Protein 6.4 6.0 - 8.3 g/dL   Albumin 3.5 3.5 - 5.2 g/dL   AST 24 0 - 37 U/L   ALT 15 0 - 35 U/L   Alkaline Phosphatase 90 39 - 117 U/L   Total Bilirubin 0.6 0.3 - 1.2 mg/dL   GFR calc non Af Amer 89 (L) >90 mL/min   GFR calc Af Amer >90 >90 mL/min    Comment: (NOTE) The eGFR has been calculated using the CKD EPI equation. This calculation has not been validated in all clinical situations. eGFR's persistently <90 mL/min signify possible Chronic Kidney Disease.    Anion gap 10 5 - 15  Lipase, blood     Status: None   Collection Time: 04/13/14 10:14 AM   Result Value Ref Range   Lipase 15 11 - 59 U/L  POC Urine Pregnancy, ED  (If Pre-menopausal female) - do not order at [facility]     Status: None   Collection Time: 04/13/14 10:24 AM  Result Value Ref Range   Preg Test, Ur NEGATIVE NEGATIVE    Comment:        THE SENSITIVITY OF THIS METHODOLOGY IS >24 mIU/mL   Wet prep, genital     Status: Abnormal   Collection Time: 04/13/14 10:45 AM  Result Value Ref Range   Yeast Wet Prep HPF POC NONE SEEN NONE SEEN   Trich, Wet Prep FEW (A) NONE SEEN   Clue Cells Wet Prep HPF POC FEW (A) NONE SEEN   WBC, Wet Prep HPF POC FEW (A) NONE SEEN  Basic metabolic panel     Status: Abnormal   Collection Time: 04/13/14  8:19 PM  Result Value Ref Range   Sodium 139 135 - 145 mmol/L   Potassium 3.6 3.5 - 5.1 mmol/L   Chloride 107 96 - 112 mmol/L   CO2 20 19 - 32 mmol/L   Glucose, Bld 84 70 - 99 mg/dL   BUN <5 (L) 6 - 23 mg/dL   Creatinine, Ser 0.83 0.50 - 1.10 mg/dL   Calcium 8.0 (L) 8.4 - 10.5 mg/dL   GFR calc non Af Amer >90 >90 mL/min   GFR calc Af Amer >90 >90 mL/min    Comment: (NOTE) The eGFR has been calculated using the CKD EPI equation. This calculation has not been validated in all clinical situations. eGFR's persistently <90 mL/min signify possible Chronic Kidney Disease.    Anion gap 12 5 - 15  MRSA PCR Screening     Status: None   Collection Time: 04/13/14 10:22 PM  Result Value Ref Range   MRSA by PCR NEGATIVE NEGATIVE    Comment:        The GeneXpert MRSA Assay (FDA approved for NASAL specimens only), is one component of a comprehensive MRSA colonization surveillance program. It is not intended to diagnose MRSA infection  nor to guide or monitor treatment for MRSA infections.   CBC     Status: None   Collection Time: 04/14/14  6:15 AM  Result Value Ref Range   WBC 10.2 4.0 - 10.5 K/uL   RBC 3.94 3.87 - 5.11 MIL/uL   Hemoglobin 12.1 12.0 - 15.0 g/dL   HCT 37.3 36.0 - 46.0 %   MCV 94.7 78.0 - 100.0 fL   MCH 30.7 26.0 - 34.0  pg   MCHC 32.4 30.0 - 36.0 g/dL   RDW 14.2 11.5 - 15.5 %   Platelets 285 150 - 400 K/uL  HIV antibody     Status: None   Collection Time: 04/14/14  6:15 AM  Result Value Ref Range   HIV Screen 4th Generation wRfx Non Reactive Non Reactive    Comment: (NOTE) Performed At: [facility] [address] [place], [place] [id number] Lindon Romp MD LH:[id number]   Magnesium     Status: None   Collection Time: 04/14/14  6:15 AM  Result Value Ref Range   Magnesium 2.0 1.5 - 2.5 mg/dL  Phosphorus     Status: None   Collection Time: 04/14/14  6:15 AM  Result Value Ref Range   Phosphorus 2.8 2.3 - 4.6 mg/dL  Occult blood card to lab, stool RN will collect     Status: None   Collection Time: 04/14/14  3:53 PM  Result Value Ref Range   Fecal Occult Bld NEGATIVE NEGATIVE  Basic metabolic panel     Status: Abnormal   Collection Time: 04/15/14  4:53 AM  Result Value Ref Range   Sodium 136 135 - 145 mmol/L   Potassium 3.5 3.5 - 5.1 mmol/L   Chloride 108 96 - 112 mmol/L   CO2 22 19 - 32 mmol/L   Glucose, Bld 82 70 - 99 mg/dL   BUN <5 (L) 6 - 23 mg/dL   Creatinine, Ser 0.80 0.50 - 1.10 mg/dL   Calcium 8.0 (L) 8.4 - 10.5 mg/dL   GFR calc non Af Amer >90 >90 mL/min   GFR calc Af Amer >90 >90 mL/min    Comment: (NOTE) The eGFR has been calculated using the CKD EPI equation. This calculation has not been validated in all clinical situations. eGFR's persistently <90 mL/min signify possible Chronic Kidney Disease.    Anion gap 6 5 - 15  CBC     Status: None   Collection Time: 04/15/14  4:53 AM  Result Value Ref Range   WBC 9.8 4.0 - 10.5 K/uL   RBC 3.90 3.87 - 5.11 MIL/uL   Hemoglobin 12.0 12.0 - 15.0 g/dL   HCT 37.0 36.0 - 46.0 %   MCV 94.9 78.0 - 100.0 fL   MCH 30.8 26.0 - 34.0 pg   MCHC 32.4 30.0 - 36.0 g/dL   RDW 14.1 11.5 - 15.5 %   Platelets 251 150 - 400 K/uL    Imaging / Studies: Ct Abdomen Pelvis W Contrast  04/13/2014   CLINICAL DATA:  Abdominal pain  EXAM: CT  ABDOMEN AND PELVIS WITH CONTRAST  TECHNIQUE: Multidetector CT imaging of the abdomen and pelvis was performed using the standard protocol following bolus administration of intravenous contrast. Oral contrast was also administered.  CONTRAST:  152mL OMNIPAQUE IOHEXOL 300 MG/ML  SOLN  COMPARISON:  July 13, 2012  FINDINGS: Lung bases are clear.  No focal liver lesions are identified. Gallbladder wall is not appreciably thickened. A tiny gallstone is seen within the gallbladder. There is  no biliary duct dilatation.  Spleen, pancreas, and adrenals appear normal. Kidneys bilaterally show no mass or hydronephrosis on either side. There is no renal or ureteral calculus on either side.  In the pelvis, urinary bladder is midline with normal wall thickness. There is a cyst arising from the left ovary measuring 4.3 x 3.5 cm. There is no other pelvic mass. There is no pelvic fluid collection.  There is inflammation in the proximal sigmoid colon near the rectosigmoid junction consistent with focal diverticulitis. No abscess is seen in this area. A tiny focus of air is noted within this collection suggesting a minimal microperforation. This tiny focus of air is seen on axial slice 53 series 2.  The appendix appears normal. There is no bowel obstruction. There is no free air beyond the tiny microperforation seen in the region of the sigmoid diverticulitis. There is no portal venous air.  There is no ascites, adenopathy, or abscess in the abdomen or pelvis. Aorta appears unremarkable. There are no blastic or lytic bone lesions.  IMPRESSION: Diverticulitis in the proximal sigmoid colon near the rectosigmoid junction with a tiny focus of microperforation. No abscess.  Cyst arising from the left ovary.  No bowel obstruction. No abscess. Appendix appears normal. No renal or ureteral calculus. No hydronephrosis.  Tiny gallstone in the gallbladder. Gallbladder wall does not appear thickened.   Electronically Signed   By: Lowella Grip  III M.D.   On: 04/13/2014 12:29    Medications / Allergies:  Scheduled Meds: . antiseptic oral rinse  7 mL Mouth Rinse q12n4p  . cefTRIAXone (ROCEPHIN)  IV  1 g Intravenous Q24H  . chlorhexidine  15 mL Mouth Rinse BID  . enoxaparin (LOVENOX) injection  40 mg Subcutaneous Q24H  . folic acid  1 mg Oral Daily  . metronidazole  500 mg Intravenous Q8H  . multivitamin with minerals  1 tablet Oral Daily  . nicotine  14 mg Transdermal Daily  . thiamine  100 mg Oral Daily   Or  . thiamine  100 mg Intravenous Daily   Continuous Infusions: . dextrose 5 % and 0.9% NaCl 75 mL/hr at 04/15/14 0400   PRN Meds:.LORazepam **OR** LORazepam, morphine injection, ondansetron (ZOFRAN) IV  Antibiotics: Anti-infectives    Start     Dose/Rate Route Frequency Ordered Stop   04/14/14 1000  azithromycin (ZITHROMAX) tablet 1,000 mg     1,000 mg Oral  Once 04/14/14 0837 04/14/14 1056   04/13/14 2300  metroNIDAZOLE (FLAGYL) IVPB 500 mg     500 mg 100 mL/hr over 60 Minutes Intravenous Every 8 hours 04/13/14 1502     04/13/14 1515  cefTRIAXone (ROCEPHIN) 1 g in dextrose 5 % 50 mL IVPB - Premix     1 g 100 mL/hr over 30 Minutes Intravenous Every 24 hours 04/13/14 1502     04/13/14 1315  metroNIDAZOLE (FLAGYL) IVPB 500 mg     500 mg 100 mL/hr over 60 Minutes Intravenous  Once 04/13/14 1314 04/13/14 1514   04/13/14 1315  ciprofloxacin (CIPRO) IVPB 400 mg  Status:  Discontinued     400 mg 200 mL/hr over 60 Minutes Intravenous  Once 04/13/14 1314 04/13/14 1502        Assessment/Plan HD#2 Sigmoid diverticulitis with microperforation -more pain today, voluntary guarding, difficult to examine, but seems more diffusely tender.  Excellent bowel sounds, abdomen is soft.  BM 4/12.  WBC normal.  T max 100.7.  I think it's too early to rescan her given last CT  was 4/11.  Will consider changing antibiotics to Invanz.  No acute surgical intervention is needed at this time.  If no improvement in 24-48h, we will repeat a  CT scan.  May need a Hartmann's this admission if she fails conservative management. -Keep NPO.  -rocephin/flagyl D#2 -pain control -mobilize -SCD/lovenox  Erby Pian, ANP-BC [facility] Pager [phone](7A-4:30P) For consults and floor pages call [phone](7A-4:30P)  04/15/2014 9:55 AM

## 2014-04-15 NOTE — Progress Notes (Signed)
Subjective:  Pt seen and examined in AM. Pt with fever of 100.7 overnight and blood cultures were taken. She continues to have left sided abdominal pain that responds to fentanyl. Heating pads and morphine did not work.     Objective: Vital signs in last 24 hours: Filed Vitals:   04/14/14 0810 04/14/14 1604 04/14/14 2051 04/15/14 0425  BP: 133/82 133/90 142/96 121/75  Pulse: 90 94 102 92  Temp: 98.4 F (36.9 C) 99.8 F (37.7 C) 100.7 F (38.2 C) 97.6 F (36.4 C)  TempSrc: Oral Oral Oral Oral  Resp: 21 21 18 22   Height:      Weight:      SpO2: 98% 100% 100% 96%   Weight change:   Intake/Output Summary (Last 24 hours) at 04/15/14 0750 Last data filed at 04/15/14 0600  Gross per 24 hour  Intake 1423.75 ml  Output      0 ml  Net 1423.75 ml   PHYSICAL EXAMINATION:  General: NAD Heart: Normal rate and rhythm  Lungs: Clear to auscultation. No wheezing, ronchi, or rales. Abdomen: Soft, non-distended. Voluntary guarding. Tenderness to palpation of left middle/upper quadrant. No rebound or rigidity.  Extremities: No edema.  Neuro: A & O x 3   Lab Results: Basic Metabolic Panel:  Recent Labs Lab 04/13/14 2019 04/14/14 0615 04/15/14 0453  NA 139  --  136  K 3.6  --  3.5  CL 107  --  108  CO2 20  --  22  GLUCOSE 84  --  82  BUN <5*  --  <5*  CREATININE 0.83  --  0.80  CALCIUM 8.0*  --  8.0*  MG  --  2.0  --   PHOS  --  2.8  --    Liver Function Tests:  Recent Labs Lab 04/13/14 1014  AST 24  ALT 15  ALKPHOS 90  BILITOT 0.6  PROT 6.4  ALBUMIN 3.5    Recent Labs Lab 04/13/14 1014  LIPASE 15   No results for input(s): AMMONIA in the last 168 hours. CBC:  Recent Labs Lab 04/13/14 1014 04/14/14 0615 04/15/14 0453  WBC 12.7* 10.2 9.8  NEUTROABS 9.5*  --   --   HGB 13.7 12.1 12.0  HCT 41.9 37.3 37.0  MCV 94.6 94.7 94.9  PLT 314 285 251   Urinalysis:  Recent Labs Lab 04/13/14 0959  COLORURINE YELLOW  LABSPEC 1.021  PHURINE 6.0    GLUCOSEU NEGATIVE  HGBUR TRACE*  BILIRUBINUR NEGATIVE  KETONESUR NEGATIVE  PROTEINUR NEGATIVE  UROBILINOGEN 1.0  NITRITE NEGATIVE  LEUKOCYTESUR NEGATIVE     Micro Results: Recent Results (from the past 240 hour(s))  Wet prep, genital     Status: Abnormal   Collection Time: 04/13/14 10:45 AM  Result Value Ref Range Status   Yeast Wet Prep HPF POC NONE SEEN NONE SEEN Final   Trich, Wet Prep FEW (A) NONE SEEN Final   Clue Cells Wet Prep HPF POC FEW (A) NONE SEEN Final   WBC, Wet Prep HPF POC FEW (A) NONE SEEN Final  MRSA PCR Screening     Status: None   Collection Time: 04/13/14 10:22 PM  Result Value Ref Range Status   MRSA by PCR NEGATIVE NEGATIVE Final    Comment:        The GeneXpert MRSA Assay (FDA approved for NASAL specimens only), is one component of a comprehensive MRSA colonization surveillance program. It is not intended to diagnose MRSA infection nor  to guide or monitor treatment for MRSA infections.    Studies/Results: Ct Abdomen Pelvis W Contrast  04/13/2014   CLINICAL DATA:  Abdominal pain  EXAM: CT ABDOMEN AND PELVIS WITH CONTRAST  TECHNIQUE: Multidetector CT imaging of the abdomen and pelvis was performed using the standard protocol following bolus administration of intravenous contrast. Oral contrast was also administered.  CONTRAST:  18mL OMNIPAQUE IOHEXOL 300 MG/ML  SOLN  COMPARISON:  July 13, 2012  FINDINGS: Lung bases are clear.  No focal liver lesions are identified. Gallbladder wall is not appreciably thickened. A tiny gallstone is seen within the gallbladder. There is no biliary duct dilatation.  Spleen, pancreas, and adrenals appear normal. Kidneys bilaterally show no mass or hydronephrosis on either side. There is no renal or ureteral calculus on either side.  In the pelvis, urinary bladder is midline with normal wall thickness. There is a cyst arising from the left ovary measuring 4.3 x 3.5 cm. There is no other pelvic mass. There is no pelvic fluid  collection.  There is inflammation in the proximal sigmoid colon near the rectosigmoid junction consistent with focal diverticulitis. No abscess is seen in this area. A tiny focus of air is noted within this collection suggesting a minimal microperforation. This tiny focus of air is seen on axial slice 53 series 2.  The appendix appears normal. There is no bowel obstruction. There is no free air beyond the tiny microperforation seen in the region of the sigmoid diverticulitis. There is no portal venous air.  There is no ascites, adenopathy, or abscess in the abdomen or pelvis. Aorta appears unremarkable. There are no blastic or lytic bone lesions.  IMPRESSION: Diverticulitis in the proximal sigmoid colon near the rectosigmoid junction with a tiny focus of microperforation. No abscess.  Cyst arising from the left ovary.  No bowel obstruction. No abscess. Appendix appears normal. No renal or ureteral calculus. No hydronephrosis.  Tiny gallstone in the gallbladder. Gallbladder wall does not appear thickened.   Electronically Signed   By: Lowella Grip III M.D.   On: 04/13/2014 12:29   Medications: I have reviewed the patient's current medications. Scheduled Meds: . antiseptic oral rinse  7 mL Mouth Rinse q12n4p  . cefTRIAXone (ROCEPHIN)  IV  1 g Intravenous Q24H  . chlorhexidine  15 mL Mouth Rinse BID  . enoxaparin (LOVENOX) injection  40 mg Subcutaneous Q24H  . folic acid  1 mg Oral Daily  . metronidazole  500 mg Intravenous Q8H  . multivitamin with minerals  1 tablet Oral Daily  . nicotine  14 mg Transdermal Daily  . thiamine  100 mg Oral Daily   Or  . thiamine  100 mg Intravenous Daily   Continuous Infusions: . dextrose 5 % and 0.9% NaCl 75 mL/hr at 04/15/14 0400   PRN Meds:.LORazepam **OR** LORazepam, morphine injection, ondansetron (ZOFRAN) IV Assessment/Plan:  Recurrent acute diverticulitis with microperforation - Pt febrile to 100.7 overnight with persistent pain and voluntary   guarding.  -Appreciate surgery recommendations, may need laparoscopy/laparotomy with potential ostomy if no improvement  -Bowel rest and D5 NS 75 mL/hr  -Change IV flagyl and ceftriaxone 1 g daily to Invanx 1 g daily and IV flagyl 500 mg BID (for trichomonas/BV infection)   -Follow-up blood cultures x 2 from 4/12 -IV fentanyl 12.5 mcg Q 5 hr PRN pain  -Zofran 4 mg Q 8 hr PRN nausea  -Appreciate nutrition consult for diverticulitis diet (low fiber on discharge with transition to eventual high fiber diet)  -  Pt will need outpatient colonoscopy after resolution in 6-8 weeks to exclude malignancy  Recurrent Bacterial Vaginosis and Trichomonas Vaginitis - Pt reports history of both in the past. Denies symptoms. Wet prep with clue cells and few trichomonas.  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 Pt is s/p azithromycin 1 g on 04/14/14. HIV negative.  -Partner needs treatment as well  Alcohol Use - Pt reports drinking 40 oz alcohol daily.  -Place on CIWA protocol  -Pt counseled on cessation   Tobacco Use - Pt reports smoking 0.7 pack a day for past 15 years.  -Place nicotine 14 mg patch daily -Pt counseled on cessation   Diet: NPO DVT Ppx: Lovenox Code: Full   Dispo: Disposition is deferred at this time, awaiting improvement of current medical problems.  Anticipated discharge in approximately 3-5 day(s).   The patient does have a current PCP Frederico Hamman, MD) and does need an [facility] hospital follow-up appointment after discharge.  The patient does not have transportation limitations that hinder transportation to clinic appointments.  .Services Needed at time of discharge: Y = Yes, Blank = No PT:   OT:   RN:   Equipment:   Other:     LOS: 2 days   Juluis Mire, MD 04/15/2014, 7:50 AM

## 2014-04-15 NOTE — Progress Notes (Signed)
Looks better this PM.  Hold on any operative intervention.  If ok in am can start diet. Need 14 days of antibiotics.

## 2014-04-15 NOTE — Care Management Note (Signed)
    Page 1 of 1   04/15/2014     11:41:41 AM CARE MANAGEMENT NOTE 04/15/2014  Patient:  Tina Riggs, Tina Riggs   Account Number:  [id number]  Date Initiated:  04/14/2014  Documentation initiated by:  ROYAL,CHERYL  Subjective/Objective Assessment:   CM following for progression and d/c planning.     Action/Plan:   Noted request for medication asssitance, however this pt has insurance and no chronic issues noted that require ongoing medications.   Anticipated DC Date:  04/16/2014   Anticipated DC Plan:  [place] [facility]  Medication Assistance      Choice offered to / List presented to:             Status of service:  In process, will continue to follow Medicare Important Message given?  NO (If response is "NO", the following Medicare IM given date fields will be blank) Date Medicare IM given:   Medicare IM given by:   Date Additional Medicare IM given:   Additional Medicare IM given by:    Discharge Disposition:  HOME/SELF CARE  Per UR Regulation:  Reviewed for med. necessity/level of care/duration of stay  If discussed at [place] of Stay Meetings, dates discussed:    Comments:  04/15/14 Ellan Lambert, RN, BSN [phone] CM consult for medication assistance.  Met with pt to discuss this: pt states she has insurance, but does not get paid until next week.  She states she has no money to get medications filled until next week when she gets paid. Unfortunately, pt is not eligible for medication assistance, as she has insurance.  She states she has no friends or family to lend her the money.  Would recommend that MD use generic meds on [facility] $4 list if at all possible, as pt states she MAY be able to come up with the money if meds are very low cost.  Will sign off.

## 2014-04-16 ENCOUNTER — Encounter (HOSPITAL_COMMUNITY)
Admission: EM | Disposition: A | Discharge status: Home / Self Care | Payer: Self-pay | Source: Home / Self Care | Attending: Internal Medicine

## 2014-04-16 SURGERY — LAPAROSCOPY, DIAGNOSTIC
Anesthesia: General

## 2014-04-16 MED ORDER — LORAZEPAM 1 MG PO TABS
1.0000 mg | ORAL_TABLET | Freq: Four times a day (QID) | ORAL | Status: AC | PRN
  Administered 2014-04-18: 1 mg via ORAL
  Filled 2014-04-16: qty 1

## 2014-04-16 MED ORDER — LORAZEPAM 2 MG/ML IJ SOLN: 1.0000 mg | Freq: Four times a day (QID) | INTRAMUSCULAR | Status: AC | PRN

## 2014-04-16 MED ORDER — FENTANYL CITRATE (PF) 100 MCG/2ML IJ SOLN
12.5000 ug | INTRAMUSCULAR | Status: DC | PRN
  Administered 2014-04-16 – 2014-04-17 (×5): 12.5 ug via INTRAVENOUS

## 2014-04-16 MED ORDER — OXYCODONE HCL 5 MG PO TABS
5.0000 mg | ORAL_TABLET | ORAL | Status: DC | PRN
  Administered 2014-04-17 – 2014-04-18 (×3): 5 mg via ORAL
  Filled 2014-04-16 (×3): qty 1

## 2014-04-16 NOTE — Progress Notes (Signed)
Subjective: Patient in bed. Febrile at 6pm to 100.7, no other events overnight. Reports belly pain improved and pain well controlled on current medications. Reports 5 loose BMs yesterday and 2 today. Denies N/V.   Objective: Vital signs in last 24 hours: Filed Vitals:   04/15/14 1718 04/15/14 2055 04/15/14 2224 04/16/14 0507  BP:  128/89  125/91  Pulse:  95  86  Temp: 100.2 F (37.9 C)  99.9 F (37.7 C) 99.6 F (37.6 C)  TempSrc: Oral Oral Oral Oral  Resp:  20  18  Height:      Weight:  114.216 kg (251 lb 12.8 oz)    SpO2:  100%  92%   Weight change:   Intake/Output Summary (Last 24 hours) at 04/16/14 1008 Last data filed at 04/16/14 6295  Gross per 24 hour  Intake   2050 ml  Output      2 ml  Net   2048 ml   General: resting in bed HEENT: PERRL, EOMI, no scleral icterus Cardiac: RRR, no rubs, murmurs or gallops Pulm: clear to auscultation bilaterally, moving normal volumes of air Abd: soft, moderately TTP especially in LLQ, improved since 4/13, no rebound or guarding nondistended, decreased BS present Ext: warm and well perfused, no pedal edema Neuro: alert and oriented X3, cranial nerves II-XII grossly intact  Lab Results: Basic Metabolic Panel:  Recent Labs  04/13/14 2019 04/14/14 0615 04/15/14 0453  NA 139  --  136  K 3.6  --  3.5  CL 107  --  108  CO2 20  --  22  GLUCOSE 84  --  82  BUN <5*  --  <5*  CREATININE 0.83  --  0.80  CALCIUM 8.0*  --  8.0*  MG  --  2.0  --   PHOS  --  2.8  --    Liver Function Tests:  Recent Labs  04/13/14 1014  AST 24  ALT 15  ALKPHOS 90  BILITOT 0.6  PROT 6.4  ALBUMIN 3.5    Recent Labs  04/13/14 1014  LIPASE 15   CBC:  Recent Labs  04/13/14 1014 04/14/14 0615 04/15/14 0453  WBC 12.7* 10.2 9.8  NEUTROABS 9.5*  --   --   HGB 13.7 12.1 12.0  HCT 41.9 37.3 37.0  MCV 94.6 94.7 94.9  PLT 314 285 251    Micro Results: Recent Results (from the past 240 hour(s))  Wet prep, genital     Status:  Abnormal   Collection Time: 04/13/14 10:45 AM  Result Value Ref Range Status   Yeast Wet Prep HPF POC NONE SEEN NONE SEEN Final   Trich, Wet Prep FEW (A) NONE SEEN Final   Clue Cells Wet Prep HPF POC FEW (A) NONE SEEN Final   WBC, Wet Prep HPF POC FEW (A) NONE SEEN Final  MRSA PCR Screening     Status: None   Collection Time: 04/13/14 10:22 PM  Result Value Ref Range Status   MRSA by PCR NEGATIVE NEGATIVE Final    Comment:        The GeneXpert MRSA Assay (FDA approved for NASAL specimens only), is one component of a comprehensive MRSA colonization surveillance program. It is not intended to diagnose MRSA infection nor to guide or monitor treatment for MRSA infections.    Medications: I have reviewed the patient's current medications. Scheduled Meds: . antiseptic oral rinse  7 mL Mouth Rinse q12n4p  . chlorhexidine  15 mL Mouth Rinse BID  .  enoxaparin (LOVENOX) injection  40 mg Subcutaneous Q24H  . ertapenem  1 g Intravenous Q24H  . folic acid  1 mg Oral Daily  . metronidazole  500 mg Intravenous Q12H  . multivitamin with minerals  1 tablet Oral Daily  . nicotine  14 mg Transdermal Daily  . thiamine  100 mg Oral Daily   Or  . thiamine  100 mg Intravenous Daily   Continuous Infusions: . dextrose 5 % and 0.9% NaCl 75 mL/hr at 04/15/14 1952   PRN Meds:.fentaNYL, LORazepam **OR** LORazepam, ondansetron (ZOFRAN) IV Assessment/Plan: Principal Problem:   Diverticulitis of colon with perforation Active Problems:   Bacterial vaginosis   Tobacco use   Alcohol use   Chlamydia   Trichomonal vaginitis  Diverticulitis with micro perforation: Day 2 Invanz (received two days IV ceftriaxone/metronidazole). Febrile overnight, no other signs of sepsis (no tachycardia, tachypnea, normal WBC). Blood cultures negative. Abdominal pain improved on exam. Responding well to fentanyl. Endorses increased BMs.   - surgery following, appreciate recs - CT scan abdomen and pelvis with contrast  4/16 - continue IV Invanz 1g qday - advance diet to clear liquids - f/u blood cultures - IV fentanyl 12.56mcg q3hrs prn for pain - PO oxy 5mg  q6hrs for moderate pain - con't maintenance fluids D5NS @ 75cc/hr until confirmed tolerating clears - IV ondansetron 4mg  q8hrs prn for nausea - will need colonscopy 6-8 weeks after d/c - will encourage ambulation  BV & trichominiasis: Clue cells and few trich detected on wet prep. Day 4 of metronidazole 500mg  BID. - PO metronidazole 500mg  BID x7 days   Chlamydia: Patient reports having had chlamydia in 2008. Denies any urinary sx or vaginal discharge. Patient reports having sought testing two months ago but not following up. She is upset by the news of this diagnosis.  - offer expedited partner therapy on d/c  Alcohol use: Reports 40-120oz beer/daily (variable report by provider). Received one dose lorazepam overnight.  - continue CIWA protocol  Tobacco use: 3/4 PPD x 9 years.  - con't nicotine patch  Dispo: Disposition is deferred at this time, awaiting improvement of current medical problems. Anticipated discharge in approximately 2 day(s).   The patient does have a current PCP Frederico Hamman, MD) and does need an [facility] hospital follow-up appointment after discharge.  The patient does not have transportation limitations that hinder transportation to clinic appointments. .Services Needed at time of discharge: Y = Yes, Blank = No PT:   OT:   RN:   Equipment:   Other:     LOS: 3 days   Carolan Shiver, Med Student 04/16/2014, 10:08 AM

## 2014-04-16 NOTE — Progress Notes (Signed)
Subjective:  Pt seen and examined in AM. Pt with fever of 100.7 yesterday. She has significantly improved pain today and is requesting a diet. She had 5 loose BM's yesterday with no nausea or vomiting.      Objective: Vital signs in last 24 hours: Filed Vitals:   04/15/14 1718 04/15/14 2055 04/15/14 2224 04/16/14 0507  BP:  128/89  125/91  Pulse:  95  86  Temp: 100.2 F (37.9 C)  99.9 F (37.7 C) 99.6 F (37.6 C)  TempSrc: Oral Oral Oral Oral  Resp:  20  18  Height:      Weight:  251 lb 12.8 oz (114.216 kg)    SpO2:  100%  92%   Weight change:   Intake/Output Summary (Last 24 hours) at 04/16/14 1004 Last data filed at 04/16/14 8588  Gross per 24 hour  Intake   2050 ml  Output      2 ml  Net   2048 ml   PHYSICAL EXAMINATION:  General: NAD Heart: Normal rate and rhythm  Lungs: Clear to auscultation. No wheezing, ronchi, or rales. Abdomen: Soft, non-distended. Mild tenderness to palpation of left middle/upper quadrant. No rebound, guarding, or rigidity.  Extremities: No edema.  Neuro: A & O x 3   Lab Results: Basic Metabolic Panel:  Recent Labs Lab 04/13/14 2019 04/14/14 0615 04/15/14 0453  NA 139  --  136  K 3.6  --  3.5  CL 107  --  108  CO2 20  --  22  GLUCOSE 84  --  82  BUN <5*  --  <5*  CREATININE 0.83  --  0.80  CALCIUM 8.0*  --  8.0*  MG  --  2.0  --   PHOS  --  2.8  --    Liver Function Tests:  Recent Labs Lab 04/13/14 1014  AST 24  ALT 15  ALKPHOS 90  BILITOT 0.6  PROT 6.4  ALBUMIN 3.5    Recent Labs Lab 04/13/14 1014  LIPASE 15   CBC:  Recent Labs Lab 04/13/14 1014 04/14/14 0615 04/15/14 0453  WBC 12.7* 10.2 9.8  NEUTROABS 9.5*  --   --   HGB 13.7 12.1 12.0  HCT 41.9 37.3 37.0  MCV 94.6 94.7 94.9  PLT 314 285 251   Urinalysis:  Recent Labs Lab 04/13/14 0959  COLORURINE YELLOW  LABSPEC 1.021  PHURINE 6.0  GLUCOSEU NEGATIVE  HGBUR TRACE*  BILIRUBINUR NEGATIVE  KETONESUR NEGATIVE  PROTEINUR NEGATIVE    UROBILINOGEN 1.0  NITRITE NEGATIVE  LEUKOCYTESUR NEGATIVE     Micro Results: Recent Results (from the past 240 hour(s))  Wet prep, genital     Status: Abnormal   Collection Time: 04/13/14 10:45 AM  Result Value Ref Range Status   Yeast Wet Prep HPF POC NONE SEEN NONE SEEN Final   Trich, Wet Prep FEW (A) NONE SEEN Final   Clue Cells Wet Prep HPF POC FEW (A) NONE SEEN Final   WBC, Wet Prep HPF POC FEW (A) NONE SEEN Final  MRSA PCR Screening     Status: None   Collection Time: 04/13/14 10:22 PM  Result Value Ref Range Status   MRSA by PCR NEGATIVE NEGATIVE Final    Comment:        The GeneXpert MRSA Assay (FDA approved for NASAL specimens only), is one component of a comprehensive MRSA colonization surveillance program. It is not intended to diagnose MRSA infection nor to guide or monitor treatment  for MRSA infections.    Studies/Results: No results found. Medications: I have reviewed the patient's current medications. Scheduled Meds: . antiseptic oral rinse  7 mL Mouth Rinse q12n4p  . chlorhexidine  15 mL Mouth Rinse BID  . enoxaparin (LOVENOX) injection  40 mg Subcutaneous Q24H  . ertapenem  1 g Intravenous Q24H  . folic acid  1 mg Oral Daily  . metronidazole  500 mg Intravenous Q12H  . multivitamin with minerals  1 tablet Oral Daily  . nicotine  14 mg Transdermal Daily  . thiamine  100 mg Oral Daily   Or  . thiamine  100 mg Intravenous Daily   Continuous Infusions: . dextrose 5 % and 0.9% NaCl 75 mL/hr at 04/15/14 1952   PRN Meds:.fentaNYL, LORazepam **OR** LORazepam, ondansetron (ZOFRAN) IV Assessment/Plan:  Recurrent acute diverticulitis - Pt afebrile overnight with improved pain and no peritoneal signs after broadening antibiotic coverage. -Appreciate surgery recommendations, will repeat CT abdomen/pelvis w/contrast on Saturaday -Advance diet to clears and continue D5 NS 75 mL/hr until tolerates PO intake -Continue Invanz 1 g daily Day 2 and IV flagyl 500  mg BID Day 4 (for trichomonas/BV infection)   -Follow-up blood cultures x 2 from 4/12 -IV fentanyl 12.5 mcg Q 3 hr PRN pain and start oxycodone IR 5 mg Q 4 hr PRN  -Zofran 4 mg Q 8 hr PRN nausea  -Appreciate nutrition consult for diverticulitis diet (low fiber on discharge with transition to eventual high fiber diet)  -Pt will need outpatient colonoscopy after resolution in 6-8 weeks to exclude malignancy  Recurrent Bacterial Vaginosis and Trichomonas Vaginitis - Pt reports history of both in the past. Denies symptoms. Wet prep with clue cells and few trichomonas.  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 Pt is s/p azithromycin 1 g on 04/14/14. HIV negative.  -Partner needs treatment as well  Alcohol Use - Pt reports drinking 40 oz alcohol daily.  -Place on CIWA protocol  -Pt counseled on cessation   Tobacco Use - Pt reports smoking 0.7 pack a day for past 15 years.  -Place nicotine 14 mg patch daily -Pt counseled on cessation   Diet: Clears DVT Ppx: Lovenox, SCD's Code: Full   Dispo: Disposition is deferred at this time, awaiting improvement of current medical problems.  Anticipated discharge in approximately 3-5 day(s).   The patient does have a current PCP Frederico Hamman, MD) and does need an [facility] hospital follow-up appointment after discharge.  The patient does not have transportation limitations that hinder transportation to clinic appointments.  .Services Needed at time of discharge: Y = Yes, Blank = No PT:   OT:   RN:   Equipment:   Other:     LOS: 3 days   Juluis Mire, MD 04/16/2014, 10:04 AM

## 2014-04-16 NOTE — Progress Notes (Signed)
Patient ID: Tina Riggs, female   DOB: Jun 13, 1980, 34 y.o.   MRN: [id number]     [facility]      [address] [place], [place] [address]    Phone: [phone] FAX: [phone]     Subjective: 2 BMs today. No dysuria.  VSS.  Fever 1007. Yesterday evening.  Not mobilizing much.    Objective:  Vital signs:  Filed Vitals:   04/15/14 1718 04/15/14 2055 04/15/14 2224 04/16/14 0507  BP:  128/89  125/91  Pulse:  95  86  Temp: 100.2 F (37.9 C)  99.9 F (37.7 C) 99.6 F (37.6 C)  TempSrc: Oral Oral Oral Oral  Resp:  20  18  Height:      Weight:  114.216 kg (251 lb 12.8 oz)    SpO2:  100%  92%    Last BM Date: 04/15/14  Intake/Output   Yesterday:  04/13 0701 - 04/14 0700 In: 2050 [I.V.:1800; IV Piggyback:250] Out: 1 [Stool:1] This shift:  Total I/O In: 0  Out: 1 [Stool:1]   Physical Exam: General: Pt awake/alert/oriented x4 in no acute distress Abdomen: Soft.  Nondistended.  TTP LLQ.   No evidence of peritonitis.  No incarcerated hernias.    Problem List:   Principal Problem:   Diverticulitis of colon with perforation Active Problems:   Bacterial vaginosis   Tobacco use   Alcohol use   Chlamydia   Trichomonal vaginitis    Results:   Labs: Results for orders placed or performed during the hospital encounter of 04/13/14 (from the past 48 hour(s))  Occult blood card to lab, stool RN will collect     Status: None   Collection Time: 04/14/14  3:53 PM  Result Value Ref Range   Fecal Occult Bld NEGATIVE NEGATIVE  Basic metabolic panel     Status: Abnormal   Collection Time: 04/15/14  4:53 AM  Result Value Ref Range   Sodium 136 135 - 145 mmol/L   Potassium 3.5 3.5 - 5.1 mmol/L   Chloride 108 96 - 112 mmol/L   CO2 22 19 - 32 mmol/L   Glucose, Bld 82 70 - 99 mg/dL   BUN <5 (L) 6 - 23 mg/dL   Creatinine, Ser 0.80 0.50 - 1.10 mg/dL   Calcium 8.0 (L) 8.4 - 10.5 mg/dL   GFR calc non Af Amer >90 >90 mL/min    GFR calc Af Amer >90 >90 mL/min    Comment: (NOTE) The eGFR has been calculated using the CKD EPI equation. This calculation has not been validated in all clinical situations. eGFR's persistently <90 mL/min signify possible Chronic Kidney Disease.    Anion gap 6 5 - 15  CBC     Status: None   Collection Time: 04/15/14  4:53 AM  Result Value Ref Range   WBC 9.8 4.0 - 10.5 K/uL   RBC 3.90 3.87 - 5.11 MIL/uL   Hemoglobin 12.0 12.0 - 15.0 g/dL   HCT 37.0 36.0 - 46.0 %   MCV 94.9 78.0 - 100.0 fL   MCH 30.8 26.0 - 34.0 pg   MCHC 32.4 30.0 - 36.0 g/dL   RDW 14.1 11.5 - 15.5 %   Platelets 251 150 - 400 K/uL    Imaging / Studies: No results found.  Medications / Allergies:  Scheduled Meds: . antiseptic oral rinse  7 mL Mouth Rinse q12n4p  . chlorhexidine  15 mL Mouth Rinse BID  . enoxaparin (LOVENOX) injection  40 mg Subcutaneous Q24H  . ertapenem  1 g Intravenous Q24H  . folic acid  1 mg Oral Daily  . metronidazole  500 mg Intravenous Q12H  . multivitamin with minerals  1 tablet Oral Daily  . nicotine  14 mg Transdermal Daily  . thiamine  100 mg Oral Daily   Or  . thiamine  100 mg Intravenous Daily   Continuous Infusions: . dextrose 5 % and 0.9% NaCl 75 mL/hr at 04/15/14 1952   PRN Meds:.fentaNYL, LORazepam **OR** LORazepam, ondansetron (ZOFRAN) IV  Antibiotics: Anti-infectives    Start     Dose/Rate Route Frequency Ordered Stop   04/15/14 1430  metroNIDAZOLE (FLAGYL) IVPB 500 mg     500 mg 100 mL/hr over 60 Minutes Intravenous Every 12 hours 04/15/14 1326 04/20/14 0229   04/15/14 1400  ertapenem (INVANZ) 1 g in sodium chloride 0.9 % 50 mL IVPB     1 g 100 mL/hr over 30 Minutes Intravenous Every 24 hours 04/15/14 1322     04/14/14 1000  azithromycin (ZITHROMAX) tablet 1,000 mg     1,000 mg Oral  Once 04/14/14 0837 04/14/14 1056   04/13/14 2300  metroNIDAZOLE (FLAGYL) IVPB 500 mg  Status:  Discontinued     500 mg 100 mL/hr over 60 Minutes Intravenous Every 8 hours  04/13/14 1502 04/15/14 1322   04/13/14 1515  cefTRIAXone (ROCEPHIN) 1 g in dextrose 5 % 50 mL IVPB - Premix  Status:  Discontinued     1 g 100 mL/hr over 30 Minutes Intravenous Every 24 hours 04/13/14 1502 04/15/14 1322   04/13/14 1315  metroNIDAZOLE (FLAGYL) IVPB 500 mg     500 mg 100 mL/hr over 60 Minutes Intravenous  Once 04/13/14 1314 04/13/14 1514   04/13/14 1315  ciprofloxacin (CIPRO) IVPB 400 mg  Status:  Discontinued     400 mg 200 mL/hr over 60 Minutes Intravenous  Once 04/13/14 1314 04/13/14 1502        Assessment/Plan HD#2 Sigmoid diverticulitis with microperforation  -less tender and more localized to LLQ.  Febrile 100.7 y4/13 1600.  Normal white count.  Continue with conservative management  -CT of abdomen and pelvis with contrast on Saturday -allow for clears -may have PO pain meds from surgical standpoint -rocephin/flagyl D#2.  Colbert Ewing D#1 -mobilize -SCD/lovenox  Morbidly Obese  Erby Pian, [facility] Pager [phone]) For consults and floor pages call [phone])  04/16/2014 10:03 AM  Agree with above. Doing better. 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  Alphonsa Overall, MD, [facility] Pager: [phone] Office phone:  [phone]

## 2014-04-17 ENCOUNTER — Inpatient Hospital Stay (HOSPITAL_COMMUNITY): Payer: 59

## 2014-04-17 DIAGNOSIS — E876 Hypokalemia: Secondary | ICD-10-CM

## 2014-04-17 DIAGNOSIS — F17210 Nicotine dependence, cigarettes, uncomplicated: Secondary | ICD-10-CM

## 2014-04-17 DIAGNOSIS — Z8719 Personal history of other diseases of the digestive system: Secondary | ICD-10-CM

## 2014-04-17 LAB — BASIC METABOLIC PANEL
ANION GAP: 10 (ref 5–15)
BUN: <5 mg/dL — ABNORMAL LOW (ref 6–23)
CO2: 20 mmol/L (ref 19–32)
CREATININE: 0.66 mg/dL (ref 0.50–1.10)
Calcium: 8.1 mg/dL — ABNORMAL LOW (ref 8.4–10.5)
Chloride: 106 mmol/L (ref 96–112)
GFR calc Af Amer: >90 mL/min (ref >90)
GFR calc non Af Amer: >90 mL/min (ref >90)
Glucose, Bld: 93 mg/dL (ref 70–99)
Potassium: 3.4 mmol/L — ABNORMAL LOW (ref 3.5–5.1)
SODIUM: 136 mmol/L (ref 135–145)

## 2014-04-17 LAB — CBC
HCT: 34.3 % — ABNORMAL LOW (ref 36.0–46.0)
Hemoglobin: 11.5 g/dL — ABNORMAL LOW (ref 12.0–15.0)
MCH: 31.3 pg (ref 26.0–34.0)
MCHC: 33.5 g/dL (ref 30.0–36.0)
MCV: 93.2 fL (ref 78.0–100.0)
Platelets: 262 10*3/uL (ref 150–400)
RBC: 3.68 MIL/uL — ABNORMAL LOW (ref 3.87–5.11)
RDW: 13.4 % (ref 11.5–15.5)
WBC: 7.7 10*3/uL (ref 4.0–10.5)

## 2014-04-17 MED ORDER — IOHEXOL 300 MG/ML  SOLN
100.0000 mL | Freq: Once | INTRAMUSCULAR | Status: AC | PRN
  Administered 2014-04-17: 100 mL via INTRAVENOUS

## 2014-04-17 MED ORDER — DEXTROSE-NACL 5-0.9 % IV SOLN
Freq: Once | INTRAVENOUS | Status: AC
  Administered 2014-04-17: via INTRAVENOUS

## 2014-04-17 MED ORDER — FENTANYL CITRATE (PF) 100 MCG/2ML IJ SOLN
12.5000 ug | Freq: Four times a day (QID) | INTRAMUSCULAR | Status: DC | PRN
  Administered 2014-04-17 – 2014-04-18 (×3): 12.5 ug via INTRAVENOUS
  Filled 2014-04-17 (×2): qty 2

## 2014-04-17 MED ORDER — POTASSIUM CHLORIDE CRYS ER 20 MEQ PO TBCR
40.0000 meq | EXTENDED_RELEASE_TABLET | Freq: Once | ORAL | Status: AC
  Administered 2014-04-17: 40 meq via ORAL
  Filled 2014-04-17: qty 2

## 2014-04-17 MED ORDER — IOHEXOL 300 MG/ML  SOLN
25.0000 mL | INTRAMUSCULAR | Status: AC
  Administered 2014-04-17 (×2): 25 mL via ORAL

## 2014-04-17 NOTE — Progress Notes (Signed)
Subjective: No acute events overnight. Patient is stable since yesterday, reports pain is worst at night, but is otherwise manageable during the day. Continues to have loose stools. No nausea/vomiting.   Objective: Vital signs in last 24 hours: Filed Vitals:   04/15/14 2224 04/16/14 0507 04/16/14 2010 04/17/14 0538  BP:  125/91 140/94 131/87  Pulse:  86 87 78  Temp: 99.9 F (37.7 C) 99.6 F (37.6 C) 99.2 F (37.3 C) 98.8 F (37.1 C)  TempSrc: Oral Oral Oral Oral  Resp:  18 20 22   Height:      Weight:      SpO2:  92% 100% 100%   Weight change:   Intake/Output Summary (Last 24 hours) at 04/17/14 0747 Last data filed at 04/17/14 0700  Gross per 24 hour  Intake   1020 ml  Output      3 ml  Net   1017 ml   General: resting in bed HEENT: PERRL, EOMI, no scleral icterus Cardiac: RRR, no rubs, murmurs or gallops Pulm: clear to auscultation bilaterally, moving normal volumes of air Abd: obese, soft, TTP in LUQ and LLQ, unchanged since 4/14, nondistended, increased BS present Ext: warm and well perfused, no pedal edema Neuro: alert and oriented X3, cranial nerves II-XII grossly intact  Lab Results: Basic Metabolic Panel:  Recent Labs  04/15/14 0453 04/17/14 0453  NA 136 136  K 3.5 3.4*  CL 108 106  CO2 22 20  GLUCOSE 82 93  BUN <5* <5*  CREATININE 0.80 0.66  CALCIUM 8.0* 8.1*   CBC:  Recent Labs  04/15/14 0453 04/17/14 0453  WBC 9.8 7.7  HGB 12.0 11.5*  HCT 37.0 34.3*  MCV 94.9 93.2  PLT 251 262    Micro Results: Recent Results (from the past 240 hour(s))  Wet prep, genital     Status: Abnormal   Collection Time: 04/13/14 10:45 AM  Result Value Ref Range Status   Yeast Wet Prep HPF POC NONE SEEN NONE SEEN Final   Trich, Wet Prep FEW (A) NONE SEEN Final   Clue Cells Wet Prep HPF POC FEW (A) NONE SEEN Final   WBC, Wet Prep HPF POC FEW (A) NONE SEEN Final  MRSA PCR Screening     Status: None   Collection Time: 04/13/14 10:22 PM  Result Value Ref  Range Status   MRSA by PCR NEGATIVE NEGATIVE Final    Comment:        The GeneXpert MRSA Assay (FDA approved for NASAL specimens only), is one component of a comprehensive MRSA colonization surveillance program. It is not intended to diagnose MRSA infection nor to guide or monitor treatment for MRSA infections.   Culture, blood (routine x 2)     Status: None (Preliminary result)   Collection Time: 04/14/14  9:57 PM  Result Value Ref Range Status   Specimen Description BLOOD LEFT ANTECUBITAL  Final   Special Requests BOTTLES DRAWN AEROBIC AND ANAEROBIC 10CC  Final   Culture   Final           BLOOD CULTURE RECEIVED NO GROWTH TO DATE CULTURE WILL BE HELD FOR 5 DAYS BEFORE ISSUING A FINAL NEGATIVE REPORT Note: Culture results may be compromised due to an excessive volume of blood received in culture bottles. Performed at [facility]    Report Status PENDING  Incomplete  Culture, blood (routine x 2)     Status: None (Preliminary result)   Collection Time: 04/14/14 10:00 PM  Result Value Ref  Range Status   Specimen Description BLOOD LEFT HAND  Final   Special Requests BOTTLES DRAWN AEROBIC ONLY 10CC  Final   Culture   Final           BLOOD CULTURE RECEIVED NO GROWTH TO DATE CULTURE WILL BE HELD FOR 5 DAYS BEFORE ISSUING A FINAL NEGATIVE REPORT Performed at [facility]    Report Status PENDING  Incomplete   Studies/Results: No results found. Medications: I have reviewed the patient's current medications. Scheduled Meds: . antiseptic oral rinse  7 mL Mouth Rinse q12n4p  . chlorhexidine  15 mL Mouth Rinse BID  . enoxaparin (LOVENOX) injection  40 mg Subcutaneous Q24H  . ertapenem  1 g Intravenous Q24H  . folic acid  1 mg Oral Daily  . metronidazole  500 mg Intravenous Q12H  . multivitamin with minerals  1 tablet Oral Daily  . nicotine  14 mg Transdermal Daily  . thiamine  100 mg Oral Daily   Or  . thiamine  100 mg Intravenous Daily   Continuous  Infusions: . dextrose 5 % and 0.9% NaCl 75 mL/hr at 04/17/14 0200   PRN Meds:.fentaNYL (SUBLIMAZE) injection, LORazepam **OR** LORazepam, ondansetron (ZOFRAN) IV, oxyCODONE Assessment/Plan: Principal Problem:   Diverticulitis of colon with perforation Active Problems:   Bacterial vaginosis   Tobacco use   Alcohol use   Chlamydia   Trichomonal vaginitis  Diverticulitis with micro perforation: Day 3 Invanz (received two days IV ceftriaxone/metronidazole). Afebrile last 24 hours, no signs of sepsis (no tachycardia, tachypnea, normal WBC). Blood cultures from 4/12 negative. Abdominal pain improved since admission but unchanged since 4/15. Continues to endorse BMs. Responding well to fentanyl, received 3 doses overnight. Will encourage use of PO pain medication. Per surgery recommendations, will proceed with CT abdomen/pelvis on 4/15 given improvement. - surgery following, appreciate recs - advance diet if CT stable or improved - CT scan abdomen and pelvis with contrast 4/15 - continue IV Invanz 1g qday - IV fentanyl 12.8mcg q6hrs prn for pain, encourage transition to PO pain medication - PO oxy 5mg  q4hrs for moderate pain - IV ondansetron 4mg  q8hrs prn for nausea - will need colonscopy 6-8 weeks after d/c - will encourage ambulation  Hypokalemia: 3.5 on 4/14, 3.4 on 4/15. Asymptomatic.  - PO K 40mg   BV & trichominiasis: Clue cells and few trich detected on wet prep. Day 4 of metronidazole 500mg  BID. - PO metronidazole 500mg  BID x7 days  - will require counseling regarding contraindication for metronidazole and etoh consumption  Chlamydia: Patient reports having had chlamydia in 2008. Denies any urinary sx or vaginal discharge. Patient reports having sought testing two months ago but not following up. She is upset by the news of this diagnosis.  - offer expedited partner therapy on d/c  Alcohol use: Reports 40-120oz beer/daily (variable report by provider). Received one dose  lorazepam overnight.  - continue CIWA protocol  Tobacco use: 3/4 PPD x 9 years.  - con't nicotine patch  Dispo: Disposition is deferred at this time, awaiting improvement of current medical problems. Anticipated discharge in approximately 2 day(s).   The patient does have a current PCP Frederico Hamman, MD) and does need an [facility] hospital follow-up appointment after discharge.  The patient does not have transportation limitations that hinder transportation to clinic appointments. .Services Needed at time of discharge: Y = Yes, Blank = No PT:   OT:   RN:   Equipment:   Other:     LOS: 4 days  Carolan Shiver, Med Student 04/17/2014, 7:47 AM

## 2014-04-17 NOTE — Progress Notes (Signed)
Subjective:  Pt seen and examined in AM. She reports her abdominal pain is worse at night and is still taking fentanyl frequently (has not taken oxycodone). She denies nausea or vomiting and is having normal BM. She denies urinary symptoms.    Objective: Vital signs in last 24 hours: Filed Vitals:   04/15/14 2224 04/16/14 0507 04/16/14 2010 04/17/14 0538  BP:  125/91 140/94 131/87  Pulse:  86 87 78  Temp: 99.9 F (37.7 C) 99.6 F (37.6 C) 99.2 F (37.3 C) 98.8 F (37.1 C)  TempSrc: Oral Oral Oral Oral  Resp:  18 20 22   Height:      Weight:      SpO2:  92% 100% 100%   Weight change:   Intake/Output Summary (Last 24 hours) at 04/17/14 0724 Last data filed at 04/17/14 0600  Gross per 24 hour  Intake   1020 ml  Output      2 ml  Net   1018 ml   PHYSICAL EXAMINATION:  General: NAD Heart: Normal rate and rhythm  Lungs: Clear to auscultation. No wheezing, ronchi, or rales. Abdomen: Soft, non-distended. Mild tenderness to palpation of left middle/upper quadrant. No rebound, guarding, or rigidity.  Extremities: No edema.  Neuro: A & O x 3   Lab Results: Basic Metabolic Panel:  Recent Labs Lab 04/14/14 0615 04/15/14 0453 04/17/14 0453  NA  --  136 136  K  --  3.5 3.4*  CL  --  108 106  CO2  --  22 20  GLUCOSE  --  82 93  BUN  --  <5* <5*  CREATININE  --  0.80 0.66  CALCIUM  --  8.0* 8.1*  MG 2.0  --   --   PHOS 2.8  --   --    Liver Function Tests:  Recent Labs Lab 04/13/14 1014  AST 24  ALT 15  ALKPHOS 90  BILITOT 0.6  PROT 6.4  ALBUMIN 3.5    Recent Labs Lab 04/13/14 1014  LIPASE 15   CBC:  Recent Labs Lab 04/13/14 1014  04/15/14 0453 04/17/14 0453  WBC 12.7*  < > 9.8 7.7  NEUTROABS 9.5*  --   --   --   HGB 13.7  < > 12.0 11.5*  HCT 41.9  < > 37.0 34.3*  MCV 94.6  < > 94.9 93.2  PLT 314  < > 251 262  < > = values in this interval not displayed. Urinalysis:  Recent Labs Lab 04/13/14 0959  COLORURINE YELLOW  LABSPEC  1.021  PHURINE 6.0  GLUCOSEU NEGATIVE  HGBUR TRACE*  BILIRUBINUR NEGATIVE  KETONESUR NEGATIVE  PROTEINUR NEGATIVE  UROBILINOGEN 1.0  NITRITE NEGATIVE  LEUKOCYTESUR NEGATIVE     Micro Results: Recent Results (from the past 240 hour(s))  Wet prep, genital     Status: Abnormal   Collection Time: 04/13/14 10:45 AM  Result Value Ref Range Status   Yeast Wet Prep HPF POC NONE SEEN NONE SEEN Final   Trich, Wet Prep FEW (A) NONE SEEN Final   Clue Cells Wet Prep HPF POC FEW (A) NONE SEEN Final   WBC, Wet Prep HPF POC FEW (A) NONE SEEN Final  MRSA PCR Screening     Status: None   Collection Time: 04/13/14 10:22 PM  Result Value Ref Range Status   MRSA by PCR NEGATIVE NEGATIVE Final    Comment:        The GeneXpert MRSA Assay (FDA approved for  NASAL specimens only), is one component of a comprehensive MRSA colonization surveillance program. It is not intended to diagnose MRSA infection nor to guide or monitor treatment for MRSA infections.   Culture, blood (routine x 2)     Status: None (Preliminary result)   Collection Time: 04/14/14  9:57 PM  Result Value Ref Range Status   Specimen Description BLOOD LEFT ANTECUBITAL  Final   Special Requests BOTTLES DRAWN AEROBIC AND ANAEROBIC 10CC  Final   Culture   Final           BLOOD CULTURE RECEIVED NO GROWTH TO DATE CULTURE WILL BE HELD FOR 5 DAYS BEFORE ISSUING A FINAL NEGATIVE REPORT Note: Culture results may be compromised due to an excessive volume of blood received in culture bottles. Performed at [facility]    Report Status PENDING  Incomplete  Culture, blood (routine x 2)     Status: None (Preliminary result)   Collection Time: 04/14/14 10:00 PM  Result Value Ref Range Status   Specimen Description BLOOD LEFT HAND  Final   Special Requests BOTTLES DRAWN AEROBIC ONLY 10CC  Final   Culture   Final           BLOOD CULTURE RECEIVED NO GROWTH TO DATE CULTURE WILL BE HELD FOR 5 DAYS BEFORE ISSUING A FINAL NEGATIVE  REPORT Performed at [facility]    Report Status PENDING  Incomplete   Studies/Results: No results found. Medications: I have reviewed the patient's current medications. Scheduled Meds: . antiseptic oral rinse  7 mL Mouth Rinse q12n4p  . chlorhexidine  15 mL Mouth Rinse BID  . enoxaparin (LOVENOX) injection  40 mg Subcutaneous Q24H  . ertapenem  1 g Intravenous Q24H  . folic acid  1 mg Oral Daily  . metronidazole  500 mg Intravenous Q12H  . multivitamin with minerals  1 tablet Oral Daily  . nicotine  14 mg Transdermal Daily  . thiamine  100 mg Oral Daily   Or  . thiamine  100 mg Intravenous Daily   Continuous Infusions: . dextrose 5 % and 0.9% NaCl 75 mL/hr at 04/17/14 0200   PRN Meds:.fentaNYL (SUBLIMAZE) injection, LORazepam **OR** LORazepam, ondansetron (ZOFRAN) IV, oxyCODONE Assessment/Plan:  Recurrent acute diverticulitis - Pt afebrile overnight with improving pain and no peritoneal signs. -Appreciate surgery recommendations, will repeat CT abdomen/pelvis w/contrast today -Advance diet from clears if CT scan stable or improved -Continue Invanz 1 g daily Day 3 and IV flagyl 500 mg BID Day 5 (for trichomonas/BV infection)   -Follow-up blood cultures x 2 from 4/12 -IV fentanyl 12.5 mcg Q 6 hr PRN pain and oxycodone IR 5 mg Q 4 hr PRN  -Zofran 4 mg Q 8 hr PRN nausea  -Appreciate nutrition consult for diverticulitis diet (low fiber on discharge with transition to eventual high fiber diet)  -Pt will need outpatient colonoscopy after resolution in 6-8 weeks to exclude malignancy  Hypokalemia - 3.4 this AM in setting of no po intake. Mg normal on 4/12. -Monitor BMP and Mg level  Recurrent Bacterial Vaginosis and Trichomonas Vaginitis - Pt reports history of both in the past. Asymptomatic. Wet prep with clue cells and few trichomonas.  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 Pt is s/p azithromycin 1 g on 04/14/14. HIV negative.  -Partner needs treatment as well  Alcohol Use - Pt reports drinking 40 oz alcohol daily.  -  Place on CIWA protocol  -Pt counseled on cessation   Tobacco Use - Pt reports smoking 0.7 pack a day for past 15 years.  -Place nicotine 14 mg patch daily -Pt counseled on cessation   Diet: Clears DVT Ppx: Lovenox, SCD's Code: Full   Dispo: Disposition is deferred at this time, awaiting improvement of current medical problems.  Anticipated discharge in approximately 2-3 day(s).   The patient does have a current PCP Frederico Hamman, MD) and does need an [facility] hospital follow-up appointment after discharge.  The patient does not have transportation limitations that hinder transportation to clinic appointments.  .Services Needed at time of discharge: Y = Yes, Blank = No PT:   OT:   RN:   Equipment:   Other:     LOS: 4 days   Juluis Mire, MD 04/17/2014, 7:24 AM

## 2014-04-17 NOTE — Progress Notes (Signed)
Patient ID: Tina Riggs, female   DOB: [birth year], 34 y.o.   MRN: [id number]     [facility]      [address] [place], [place] [address]    Phone: [phone] FAX: [phone]     Subjective: Pain is better.  C/o mostly at night. No n/v.  VSS.  Afebrile.  WBC normal.    Objective:  Vital signs:  Filed Vitals:   04/16/14 0507 04/16/14 2010 04/17/14 0538 04/17/14 0817  BP: 125/91 140/94 131/87 113/70  Pulse: 86 87 78 84  Temp: 99.6 F (37.6 C) 99.2 F (37.3 C) 98.8 F (37.1 C) 98.7 F (37.1 C)  TempSrc: Oral Oral Oral Oral  Resp: $Remo'18 20 22 17  'mdNzu$ Height:      Weight:      SpO2: 92% 100% 100% 100%    Last BM Date: 04/16/14  Intake/Output   Yesterday:  04/14 0701 - 04/15 0700 In: 1020 [P.O.:120; I.V.:900] Out: 3 [Stool:3] This shift:  Total I/O In: 320 [P.O.:320] Out: 1 [Stool:1]   Physical Exam: General: Pt awake/alert/oriented x4 in no  acute distress  Abdomen: Soft.  Nondistended.  TTP left abdomen.  No evidence of peritonitis.  No incarcerated hernias.   Problem List:   Principal Problem:   Diverticulitis of colon with perforation Active Problems:   Bacterial vaginosis   Tobacco use   Alcohol use   Chlamydia   Trichomonal vaginitis    Results:   Labs: Results for orders placed or performed during the hospital encounter of 04/13/14 (from the past 48 hour(s))  CBC     Status: Abnormal   Collection Time: 04/17/14  4:53 AM  Result Value Ref Range   WBC 7.7 4.0 - 10.5 K/uL   RBC 3.68 (L) 3.87 - 5.11 MIL/uL   Hemoglobin 11.5 (L) 12.0 - 15.0 g/dL   HCT 34.3 (L) 36.0 - 46.0 %   MCV 93.2 78.0 - 100.0 fL   MCH 31.3 26.0 - 34.0 pg   MCHC 33.5 30.0 - 36.0 g/dL   RDW 13.4 11.5 - 15.5 %   Platelets 262 150 - 400 K/uL  Basic metabolic panel     Status: Abnormal   Collection Time: 04/17/14  4:53 AM  Result Value Ref Range   Sodium 136 135 - 145 mmol/L   Potassium 3.4 (L) 3.5 - 5.1 mmol/L   Chloride  106 96 - 112 mmol/L   CO2 20 19 - 32 mmol/L   Glucose, Bld 93 70 - 99 mg/dL   BUN <5 (L) 6 - 23 mg/dL   Creatinine, Ser 0.66 0.50 - 1.10 mg/dL   Calcium 8.1 (L) 8.4 - 10.5 mg/dL   GFR calc non Af Amer >90 >90 mL/min   GFR calc Af Amer >90 >90 mL/min    Comment: (NOTE) The eGFR has been calculated using the CKD EPI equation. This calculation has not been validated in all clinical situations. eGFR's persistently <90 mL/min signify possible Chronic Kidney Disease.    Anion gap 10 5 - 15    Imaging / Studies: No results found.  Medications / Allergies:  Scheduled Meds: . antiseptic oral rinse  7 mL Mouth Rinse q12n4p  . chlorhexidine  15 mL Mouth Rinse BID  . enoxaparin (LOVENOX) injection  40 mg Subcutaneous Q24H  . ertapenem  1 g Intravenous Q24H  . folic acid  1 mg Oral Daily  . metronidazole  500 mg Intravenous Q12H  . multivitamin  with minerals  1 tablet Oral Daily  . nicotine  14 mg Transdermal Daily  . thiamine  100 mg Oral Daily   Or  . thiamine  100 mg Intravenous Daily   Continuous Infusions: . dextrose 5 % and 0.9% NaCl 75 mL/hr at 04/17/14 0200   PRN Meds:.fentaNYL (SUBLIMAZE) injection, LORazepam **OR** LORazepam, ondansetron (ZOFRAN) IV, oxyCODONE  Antibiotics: Anti-infectives    Start     Dose/Rate Route Frequency Ordered Stop   04/15/14 1430  metroNIDAZOLE (FLAGYL) IVPB 500 mg     500 mg 100 mL/hr over 60 Minutes Intravenous Every 12 hours 04/15/14 1326 04/20/14 0229   04/15/14 1400  ertapenem (INVANZ) 1 g in sodium chloride 0.9 % 50 mL IVPB     1 g 100 mL/hr over 30 Minutes Intravenous Every 24 hours 04/15/14 1322     04/14/14 1000  azithromycin (ZITHROMAX) tablet 1,000 mg     1,000 mg Oral  Once 04/14/14 0837 04/14/14 1056   04/13/14 2300  metroNIDAZOLE (FLAGYL) IVPB 500 mg  Status:  Discontinued     500 mg 100 mL/hr over 60 Minutes Intravenous Every 8 hours 04/13/14 1502 04/15/14 1322   04/13/14 1515  cefTRIAXone (ROCEPHIN) 1 g in dextrose 5 % 50 mL  IVPB - Premix  Status:  Discontinued     1 g 100 mL/hr over 30 Minutes Intravenous Every 24 hours 04/13/14 1502 04/15/14 1322   04/13/14 1315  metroNIDAZOLE (FLAGYL) IVPB 500 mg     500 mg 100 mL/hr over 60 Minutes Intravenous  Once 04/13/14 1314 04/13/14 1514   04/13/14 1315  ciprofloxacin (CIPRO) IVPB 400 mg  Status:  Discontinued     400 mg 200 mL/hr over 60 Minutes Intravenous  Once 04/13/14 1314 04/13/14 1502        Assessment/Plan HD#4 Sigmoid diverticulitis with microperforation -will proceed with a CT of abdomen and pelvis today given marked improvement.   -will advance diet if CT is stable or improved -Invanz D#2 -she will need a nutrition consult before discharge -will need a colonoscopy in 6-8 weeks -mobilize -SCD/lovenox    Erby Pian, ANP-BC [facility] Pager [phone](7A-4:30P) For consults and floor pages call [phone](7A-4:30P)  04/17/2014 12:12 PM

## 2014-04-18 DIAGNOSIS — K5720 Diverticulitis of large intestine with perforation and abscess without bleeding: Secondary | ICD-10-CM

## 2014-04-18 LAB — BASIC METABOLIC PANEL
ANION GAP: 9 (ref 5–15)
BUN: <5 mg/dL — ABNORMAL LOW (ref 6–23)
CHLORIDE: 105 mmol/L (ref 96–112)
CO2: 24 mmol/L (ref 19–32)
CREATININE: 0.72 mg/dL (ref 0.50–1.10)
Calcium: 8.7 mg/dL (ref 8.4–10.5)
GFR calc Af Amer: >90 mL/min (ref >90)
GFR calc non Af Amer: >90 mL/min (ref >90)
GLUCOSE: 93 mg/dL (ref 70–99)
POTASSIUM: 3.6 mmol/L (ref 3.5–5.1)
Sodium: 138 mmol/L (ref 135–145)

## 2014-04-18 LAB — MAGNESIUM: Magnesium: 1.8 mg/dL (ref 1.5–2.5)

## 2014-04-18 LAB — CBC
HCT: 34.6 % — ABNORMAL LOW (ref 36.0–46.0)
Hemoglobin: 11.6 g/dL — ABNORMAL LOW (ref 12.0–15.0)
MCH: 31.3 pg (ref 26.0–34.0)
MCHC: 33.5 g/dL (ref 30.0–36.0)
MCV: 93.3 fL (ref 78.0–100.0)
PLATELETS: 263 10*3/uL (ref 150–400)
RBC: 3.71 MIL/uL — ABNORMAL LOW (ref 3.87–5.11)
RDW: 13.3 % (ref 11.5–15.5)
WBC: 9 10*3/uL (ref 4.0–10.5)

## 2014-04-18 MED ORDER — ENOXAPARIN SODIUM 40 MG/0.4ML ~~LOC~~ SOLN
40.0000 mg | SUBCUTANEOUS | Status: DC
  Administered 2014-04-18 – 2014-04-19 (×2): 40 mg via SUBCUTANEOUS
  Filled 2014-04-18 (×3): qty 0.4

## 2014-04-18 MED ORDER — FENTANYL CITRATE (PF) 100 MCG/2ML IJ SOLN
12.5000 ug | Freq: Three times a day (TID) | INTRAMUSCULAR | Status: DC | PRN
  Administered 2014-04-18 – 2014-04-19 (×2): 12.5 ug via INTRAVENOUS
  Filled 2014-04-18: qty 2

## 2014-04-18 MED ORDER — DEXTROSE-NACL 5-0.9 % IV SOLN
INTRAVENOUS | Status: DC
  Administered 2014-04-18 (×2): via INTRAVENOUS

## 2014-04-18 MED ORDER — OXYCODONE HCL 5 MG PO TABS
5.0000 mg | ORAL_TABLET | ORAL | Status: DC | PRN
  Administered 2014-04-18 – 2014-04-19 (×2): 5 mg via ORAL
  Filled 2014-04-18 (×3): qty 1

## 2014-04-18 MED ORDER — WHITE PETROLATUM GEL
Status: AC
  Administered 2014-04-18: 09:00:00
  Filled 2014-04-18: qty 1

## 2014-04-18 NOTE — Progress Notes (Signed)
Pt requested that I come back at a later time, 6am.

## 2014-04-18 NOTE — Progress Notes (Signed)
Paged Dr. Naaman Plummer regarding patient's concerns about last night's CT scan, diet orders, and faxing a letter to patient's employer. Received return call from Dr. Naaman Plummer who stated she would talk to surgery and come see the patient. Will monitor.  Joellen Jersey, RN.

## 2014-04-18 NOTE — Progress Notes (Signed)
Subjective:  Pt seen and examined in AM. She had CT yesterday that revealed worsening diverticulitis with abscess formation. She was reportably found to be eating chicken last night that was brought in from outside source. She has unchanged left sided abdominal pain that is worse at night. She has tried taking oxycodone for pain in addition to fentanyl. She denies nausea or vomiting and is having loose stools.    Objective: Vital signs in last 24 hours: Filed Vitals:   04/17/14 0817 04/17/14 2034 04/18/14 0104 04/18/14 0907  BP: 113/70 127/88 144/104 116/68  Pulse: 84 84 88 79  Temp: 98.7 F (37.1 C) 99 F (37.2 C)  98.9 F (37.2 C)  TempSrc: Oral   Oral  Resp: 17 19  17   Height:      Weight:  247 lb (112.038 kg)    SpO2: 100% 100%  97%   Weight change:   Intake/Output Summary (Last 24 hours) at 04/18/14 1013 Last data filed at 04/18/14 0908  Gross per 24 hour  Intake   1620 ml  Output      2 ml  Net   1618 ml   PHYSICAL EXAMINATION:  General: NAD Heart: Normal rate and rhythm  Lungs: Clear to auscultation. No wheezing, ronchi, or rales. Abdomen: Soft, non-distended. Mild tenderness to palpation of left middle/upper quadrant. No rebound, guarding, or rigidity.  Extremities: No edema.  Neuro: A & O x 3   Lab Results: Basic Metabolic Panel:  Recent Labs Lab 04/14/14 0615  04/17/14 0453 04/18/14 0439  NA  --   < > 136 138  K  --   < > 3.4* 3.6  CL  --   < > 106 105  CO2  --   < > 20 24  GLUCOSE  --   < > 93 93  BUN  --   < > <5* <5*  CREATININE  --   < > 0.66 0.72  CALCIUM  --   < > 8.1* 8.7  MG 2.0  --   --  1.8  PHOS 2.8  --   --   --   < > = values in this interval not displayed. Liver Function Tests:  Recent Labs Lab 04/13/14 1014  AST 24  ALT 15  ALKPHOS 90  BILITOT 0.6  PROT 6.4  ALBUMIN 3.5    Recent Labs Lab 04/13/14 1014  LIPASE 15   CBC:  Recent Labs Lab 04/13/14 1014  04/17/14 0453 04/18/14 0439  WBC 12.7*  < > 7.7  9.0  NEUTROABS 9.5*  --   --   --   HGB 13.7  < > 11.5* 11.6*  HCT 41.9  < > 34.3* 34.6*  MCV 94.6  < > 93.2 93.3  PLT 314  < > 262 263  < > = values in this interval not displayed. Urinalysis:  Recent Labs Lab 04/13/14 0959  COLORURINE YELLOW  LABSPEC 1.021  PHURINE 6.0  GLUCOSEU NEGATIVE  HGBUR TRACE*  BILIRUBINUR NEGATIVE  KETONESUR NEGATIVE  PROTEINUR NEGATIVE  UROBILINOGEN 1.0  NITRITE NEGATIVE  LEUKOCYTESUR NEGATIVE     Micro Results: Recent Results (from the past 240 hour(s))  Wet prep, genital     Status: Abnormal   Collection Time: 04/13/14 10:45 AM  Result Value Ref Range Status   Yeast Wet Prep HPF POC NONE SEEN NONE SEEN Final   Trich, Wet Prep FEW (A) NONE SEEN Final   Clue Cells Wet Prep HPF POC FEW (  A) NONE SEEN Final   WBC, Wet Prep HPF POC FEW (A) NONE SEEN Final  MRSA PCR Screening     Status: None   Collection Time: 04/13/14 10:22 PM  Result Value Ref Range Status   MRSA by PCR NEGATIVE NEGATIVE Final    Comment:        The GeneXpert MRSA Assay (FDA approved for NASAL specimens only), is one component of a comprehensive MRSA colonization surveillance program. It is not intended to diagnose MRSA infection nor to guide or monitor treatment for MRSA infections.   Culture, blood (routine x 2)     Status: None (Preliminary result)   Collection Time: 04/14/14  9:57 PM  Result Value Ref Range Status   Specimen Description BLOOD LEFT ANTECUBITAL  Final   Special Requests BOTTLES DRAWN AEROBIC AND ANAEROBIC 10CC  Final   Culture   Final           BLOOD CULTURE RECEIVED NO GROWTH TO DATE CULTURE WILL BE HELD FOR 5 DAYS BEFORE ISSUING A FINAL NEGATIVE REPORT Note: Culture results may be compromised due to an excessive volume of blood received in culture bottles. Performed at [facility]    Report Status PENDING  Incomplete  Culture, blood (routine x 2)     Status: None (Preliminary result)   Collection Time: 04/14/14 10:00 PM  Result  Value Ref Range Status   Specimen Description BLOOD LEFT HAND  Final   Special Requests BOTTLES DRAWN AEROBIC ONLY 10CC  Final   Culture   Final           BLOOD CULTURE RECEIVED NO GROWTH TO DATE CULTURE WILL BE HELD FOR 5 DAYS BEFORE ISSUING A FINAL NEGATIVE REPORT Performed at [facility]    Report Status PENDING  Incomplete   Studies/Results: Ct Abdomen Pelvis W Contrast  04/17/2014   CLINICAL DATA:  Fever a, nausea, diarrhea and left-sided abdominal pain. History of diverticulitis.  EXAM: CT ABDOMEN AND PELVIS WITH CONTRAST  TECHNIQUE: Multidetector CT imaging of the abdomen and pelvis was performed using the standard protocol following bolus administration of intravenous contrast.  CONTRAST:  145mL OMNIPAQUE IOHEXOL 300 MG/ML  SOLN  COMPARISON:  CT scan 04/13/2014  FINDINGS: The lower descending colon/upper sigmoid colon diverticulitis has progressed. There is a 3.7 x 3.1 cm abscess with significant surrounding inflammation.  The remainder the abdomen/pelvis is stable and unchanged. Solid organs are normal.  Stable left ovarian cyst.  IMPRESSION: Progression of diverticulitis involving the lower descending colon/upper sigmoid colon area with interval development of a 3.7 x 3.1 cm diverticular abscess.   Electronically Signed   By: Marijo Sanes M.D.   On: 04/17/2014 19:24   Medications: I have reviewed the patient's current medications. Scheduled Meds: . antiseptic oral rinse  7 mL Mouth Rinse q12n4p  . chlorhexidine  15 mL Mouth Rinse BID  . ertapenem  1 g Intravenous Q24H  . folic acid  1 mg Oral Daily  . metronidazole  500 mg Intravenous Q12H  . multivitamin with minerals  1 tablet Oral Daily  . nicotine  14 mg Transdermal Daily  . thiamine  100 mg Oral Daily   Or  . thiamine  100 mg Intravenous Daily   Continuous Infusions: . dextrose 5 % and 0.9% NaCl     PRN Meds:.fentaNYL (SUBLIMAZE) injection, LORazepam **OR** LORazepam, ondansetron (ZOFRAN) IV,  oxyCODONE Assessment/Plan:  Recurrent acute diverticulitis with abscess formation - Pt afebrile overnight with no leukocytosis and unchanged pain with  no peritoneal signs. CT abdomen yesterday revealed progression of diverticulitis with 3.7cm x 3.1 cm abscess formation. -Appreciate surgery recommendations, per Dr. Earleen Newport (IR) abscess not amenable to drainage, awaiting further recs  -Advance diet from clears and possibly to full per surgery    -Continue Invanz 1 g daily Day 4 and IV flagyl 500 mg BID Day 6/7 (for trichomonas/BV infection)   -Follow-up blood cultures x 2 from 4/12 -Change IV fentanyl 12.5 mcg Q 6 hr to Q 8 hr PRN pain and oxycodone IR 5 mg Q 4 hr to Q 3 hr PRN  -Zofran 4 mg Q 8 hr PRN nausea  -Appreciate nutrition consult for diverticulitis diet (low fiber on discharge with transition to eventual high fiber diet)  -Pt will need outpatient colonoscopy after resolution in 6-8 weeks to exclude malignancy  Recurrent Bacterial Vaginosis and Trichomonas Vaginitis - Pt reports history of both in the past. Asymptomatic. Wet prep with clue cells and few trichomonas.  -IV flagyl 500 mg BID Day 6/7 -Partner needs treatment as well   Recurrent Chlamydia Infection - Pt reports prior infection in 2008. Pt is s/p azithromycin 1 g on 04/14/14. HIV negative.  -Partner needs treatment as well  Alcohol Use - Pt reports drinking 40 oz alcohol daily.  -Place on CIWA protocol  -Pt counseled on cessation   Tobacco Use - Pt reports smoking 0.7 pack a day for past 15 years.  -Place nicotine 14 mg patch daily -Pt counseled on cessation   HIV Screening - Negative  Diet: Clears DVT Ppx: Lovenox, SCD's Code: Full   Dispo: Disposition is deferred at this time, awaiting improvement of current medical problems.  Anticipated discharge in approximately 2-5 day(s).   The patient does have a current PCP Frederico Hamman, MD) and does need an [facility] hospital follow-up appointment after  discharge.  The patient does not have transportation limitations that hinder transportation to clinic appointments.  .Services Needed at time of discharge: Y = Yes, Blank = No PT:   OT:   RN:   Equipment:   Other:     LOS: 5 days   Juluis Mire, MD 04/18/2014, 10:13 AM

## 2014-04-18 NOTE — Progress Notes (Signed)
Patient ID: Tina Riggs, female   DOB: 1980/11/18, 34 y.o.   MRN: [id number]     [facility]      [address] [place], [place] [address]    Phone: [phone] FAX: [phone]     Subjective: Pain more localized to the left.  Having loose stools. Denies n/v.  No further fevers.  VSS.    Objective:  Vital signs:  Filed Vitals:   04/17/14 0817 04/17/14 2034 04/18/14 0104 04/18/14 0907  BP: 113/70 127/88 144/104 116/68  Pulse: 84 84 88 79  Temp: 98.7 F (37.1 C) 99 F (37.2 C)  98.9 F (37.2 C)  TempSrc: Oral   Oral  Resp: $Remo'17 19  17  'OfBAQ$ Height:      Weight:  112.038 kg (247 lb)    SpO2: 100% 100%  97%    Last BM Date: 04/18/14  Intake/Output   Yesterday:  04/15 0701 - 04/16 0700 In: 1940 [P.O.:1440; IV Piggyback:500] Out: 2 [Urine:1; Stool:1] This shift:    I/O last 3 completed shifts: In: 2960 [P.O.:1560; I.V.:900; IV Piggyback:500] Out: 4 [Urine:1; Stool:3]     Physical Exam: General: Pt awake/alert/oriented x4 in no acute distress  Abdomen: Soft. Nondistended. TTP left abdomen. No evidence of peritonitis. No incarcerated hernias.   Problem List:   Principal Problem:   Diverticulitis of colon with perforation Active Problems:   Bacterial vaginosis   Tobacco use   Alcohol use   Chlamydia   Trichomonal vaginitis    Results:   Labs: Results for orders placed or performed during the hospital encounter of 04/13/14 (from the past 48 hour(s))  CBC     Status: Abnormal   Collection Time: 04/17/14  4:53 AM  Result Value Ref Range   WBC 7.7 4.0 - 10.5 K/uL   RBC 3.68 (L) 3.87 - 5.11 MIL/uL   Hemoglobin 11.5 (L) 12.0 - 15.0 g/dL   HCT 34.3 (L) 36.0 - 46.0 %   MCV 93.2 78.0 - 100.0 fL   MCH 31.3 26.0 - 34.0 pg   MCHC 33.5 30.0 - 36.0 g/dL   RDW 13.4 11.5 - 15.5 %   Platelets 262 150 - 400 K/uL  Basic metabolic panel     Status: Abnormal   Collection Time: 04/17/14  4:53 AM  Result Value Ref  Range   Sodium 136 135 - 145 mmol/L   Potassium 3.4 (L) 3.5 - 5.1 mmol/L   Chloride 106 96 - 112 mmol/L   CO2 20 19 - 32 mmol/L   Glucose, Bld 93 70 - 99 mg/dL   BUN <5 (L) 6 - 23 mg/dL   Creatinine, Ser 0.66 0.50 - 1.10 mg/dL   Calcium 8.1 (L) 8.4 - 10.5 mg/dL   GFR calc non Af Amer >90 >90 mL/min   GFR calc Af Amer >90 >90 mL/min    Comment: (NOTE) The eGFR has been calculated using the CKD EPI equation. This calculation has not been validated in all clinical situations. eGFR's persistently <90 mL/min signify possible Chronic Kidney Disease.    Anion gap 10 5 - 15  Basic metabolic panel     Status: Abnormal   Collection Time: 04/18/14  4:39 AM  Result Value Ref Range   Sodium 138 135 - 145 mmol/L   Potassium 3.6 3.5 - 5.1 mmol/L   Chloride 105 96 - 112 mmol/L   CO2 24 19 - 32 mmol/L   Glucose, Bld 93 70 - 99 mg/dL  BUN <5 (L) 6 - 23 mg/dL   Creatinine, Ser 0.72 0.50 - 1.10 mg/dL   Calcium 8.7 8.4 - 10.5 mg/dL   GFR calc non Af Amer >90 >90 mL/min   GFR calc Af Amer >90 >90 mL/min    Comment: (NOTE) The eGFR has been calculated using the CKD EPI equation. This calculation has not been validated in all clinical situations. eGFR's persistently <90 mL/min signify possible Chronic Kidney Disease.    Anion gap 9 5 - 15  Magnesium     Status: None   Collection Time: 04/18/14  4:39 AM  Result Value Ref Range   Magnesium 1.8 1.5 - 2.5 mg/dL  CBC     Status: Abnormal   Collection Time: 04/18/14  4:39 AM  Result Value Ref Range   WBC 9.0 4.0 - 10.5 K/uL   RBC 3.71 (L) 3.87 - 5.11 MIL/uL   Hemoglobin 11.6 (L) 12.0 - 15.0 g/dL   HCT 34.6 (L) 36.0 - 46.0 %   MCV 93.3 78.0 - 100.0 fL   MCH 31.3 26.0 - 34.0 pg   MCHC 33.5 30.0 - 36.0 g/dL   RDW 13.3 11.5 - 15.5 %   Platelets 263 150 - 400 K/uL    Imaging / Studies: Ct Abdomen Pelvis W Contrast  04/17/2014   CLINICAL DATA:  Fever a, nausea, diarrhea and left-sided abdominal pain. History of diverticulitis.  EXAM: CT  ABDOMEN AND PELVIS WITH CONTRAST  TECHNIQUE: Multidetector CT imaging of the abdomen and pelvis was performed using the standard protocol following bolus administration of intravenous contrast.  CONTRAST:  159mL OMNIPAQUE IOHEXOL 300 MG/ML  SOLN  COMPARISON:  CT scan 04/13/2014  FINDINGS: The lower descending colon/upper sigmoid colon diverticulitis has progressed. There is a 3.7 x 3.1 cm abscess with significant surrounding inflammation.  The remainder the abdomen/pelvis is stable and unchanged. Solid organs are normal.  Stable left ovarian cyst.  IMPRESSION: Progression of diverticulitis involving the lower descending colon/upper sigmoid colon area with interval development of a 3.7 x 3.1 cm diverticular abscess.   Electronically Signed   By: Marijo Sanes M.D.   On: 04/17/2014 19:24    Medications / Allergies:  Scheduled Meds: . antiseptic oral rinse  7 mL Mouth Rinse q12n4p  . chlorhexidine  15 mL Mouth Rinse BID  . ertapenem  1 g Intravenous Q24H  . folic acid  1 mg Oral Daily  . metronidazole  500 mg Intravenous Q12H  . multivitamin with minerals  1 tablet Oral Daily  . nicotine  14 mg Transdermal Daily  . thiamine  100 mg Oral Daily   Or  . thiamine  100 mg Intravenous Daily   Continuous Infusions: . dextrose 5 % and 0.9% NaCl     PRN Meds:.fentaNYL (SUBLIMAZE) injection, LORazepam **OR** LORazepam, ondansetron (ZOFRAN) IV, oxyCODONE  Antibiotics: Anti-infectives    Start     Dose/Rate Route Frequency Ordered Stop   04/15/14 1430  metroNIDAZOLE (FLAGYL) IVPB 500 mg     500 mg 100 mL/hr over 60 Minutes Intravenous Every 12 hours 04/15/14 1326 04/20/14 0229   04/15/14 1400  ertapenem (INVANZ) 1 g in sodium chloride 0.9 % 50 mL IVPB     1 g 100 mL/hr over 30 Minutes Intravenous Every 24 hours 04/15/14 1322     04/14/14 1000  azithromycin (ZITHROMAX) tablet 1,000 mg     1,000 mg Oral  Once 04/14/14 0837 04/14/14 1056   04/13/14 2300  metroNIDAZOLE (FLAGYL) IVPB 500 mg  Status:  Discontinued     500 mg 100 mL/hr over 60 Minutes Intravenous Every 8 hours 04/13/14 1502 04/15/14 1322   04/13/14 1515  cefTRIAXone (ROCEPHIN) 1 g in dextrose 5 % 50 mL IVPB - Premix  Status:  Discontinued     1 g 100 mL/hr over 30 Minutes Intravenous Every 24 hours 04/13/14 1502 04/15/14 1322   04/13/14 1315  metroNIDAZOLE (FLAGYL) IVPB 500 mg     500 mg 100 mL/hr over 60 Minutes Intravenous  Once 04/13/14 1314 04/13/14 1514   04/13/14 1315  ciprofloxacin (CIPRO) IVPB 400 mg  Status:  Discontinued     400 mg 200 mL/hr over 60 Minutes Intravenous  Once 04/13/14 1314 04/13/14 1502        Assessment/Plan HD#5 Sigmoid diverticulitis with microperforation, now a phlegmon  -CT scan shows a 3.7x3.1cm abscess.  Dr. Earleen Newport reviewed the CT and stated its mostly phlegmonous/air and no fluid to drain.  Clinically she appears better.  Now, afebrile and a normal white count on 4/16.  Will resume her clears, but would be reasonable to advance to fulls as well, will check with Dr. Donne Hazel.   Colbert Ewing D#3 -she will need a nutrition consult before discharge -will need a colonoscopy in 6-8 weeks -mobilize -SCD/lovenox  BV/trich/chlamydia  Tobacco and alcohol use--strongly encourage cessation    Erby Pian, [facility] Surgery Pager [phone](7A-4:30P) For consults and floor pages call [phone](7A-4:30P)  04/18/2014 9:34 AM

## 2014-04-18 NOTE — Progress Notes (Signed)
04/18/2014 12:31 AM  Patient found in room eating a box of chicken "Mrs Berle Mull. Informed patient that she is NPO and that she did not need to be eating. Patient verbalized and demonstrated understanding. Sign placed outside door. Will continue to assess and monitor the patient throughout the night.   Karin Griffith I

## 2014-04-19 DIAGNOSIS — D62 Acute posthemorrhagic anemia: Secondary | ICD-10-CM

## 2014-04-19 LAB — BASIC METABOLIC PANEL
Anion gap: 9 (ref 5–15)
BUN: <5 mg/dL — ABNORMAL LOW (ref 6–23)
CALCIUM: 8.3 mg/dL — AB (ref 8.4–10.5)
CHLORIDE: 102 mmol/L (ref 96–112)
CO2: 24 mmol/L (ref 19–32)
Creatinine, Ser: 0.67 mg/dL (ref 0.50–1.10)
GFR calc Af Amer: >90 mL/min (ref >90)
GLUCOSE: 92 mg/dL (ref 70–99)
Potassium: 3.4 mmol/L — ABNORMAL LOW (ref 3.5–5.1)
Sodium: 135 mmol/L (ref 135–145)

## 2014-04-19 LAB — CBC
HCT: 33.5 % — ABNORMAL LOW (ref 36.0–46.0)
HEMOGLOBIN: 11.2 g/dL — AB (ref 12.0–15.0)
MCH: 31.2 pg (ref 26.0–34.0)
MCHC: 33.4 g/dL (ref 30.0–36.0)
MCV: 93.3 fL (ref 78.0–100.0)
Platelets: 272 10*3/uL (ref 150–400)
RBC: 3.59 MIL/uL — ABNORMAL LOW (ref 3.87–5.11)
RDW: 13.3 % (ref 11.5–15.5)
WBC: 8 10*3/uL (ref 4.0–10.5)

## 2014-04-19 MED ORDER — FENTANYL CITRATE (PF) 100 MCG/2ML IJ SOLN: 12.5000 ug | Freq: Two times a day (BID) | INTRAMUSCULAR | Status: DC | PRN

## 2014-04-19 MED ORDER — OXYCODONE HCL 5 MG PO TABS
10.0000 mg | ORAL_TABLET | ORAL | Status: DC | PRN
  Administered 2014-04-19 – 2014-04-20 (×3): 10 mg via ORAL
  Filled 2014-04-19 (×3): qty 2

## 2014-04-19 MED ORDER — POTASSIUM CHLORIDE CRYS ER 20 MEQ PO TBCR
40.0000 meq | EXTENDED_RELEASE_TABLET | Freq: Once | ORAL | Status: AC
  Administered 2014-04-19: 40 meq via ORAL
  Filled 2014-04-19: qty 2

## 2014-04-19 NOTE — Progress Notes (Signed)
Subjective:  Pt seen and examined in AM. No acute events overnight. She reports her left sided abdominal pain is much better and denies fever, chills, nausea, vomiting, loose stools, or urinary symptoms. She is hungry and requesting for food.    Objective: Vital signs in last 24 hours: Filed Vitals:   04/18/14 1702 04/18/14 1955 04/19/14 0609 04/19/14 0839  BP: 129/84 135/91 106/56 118/77  Pulse: 77 88 71 89  Temp: 98.8 F (37.1 C) 98.8 F (37.1 C) 98.7 F (37.1 C) 98 F (36.7 C)  TempSrc: Oral   Oral  Resp: 17 20 21 17   Height:      Weight:  247 lb (112.038 kg)    SpO2: 100% 100% 99% 100%   Weight change: 0 lb (0 kg)  Intake/Output Summary (Last 24 hours) at 04/19/14 1259 Last data filed at 04/19/14 0946  Gross per 24 hour  Intake 3672.5 ml  Output      0 ml  Net 3672.5 ml   PHYSICAL EXAMINATION:  General: NAD Heart: Normal rate and rhythm  Lungs: Clear to auscultation. No wheezing, ronchi, or rales. Abdomen: Soft, non-distended. Very mild tenderness to palpation of left middle/upper quadrant. No rebound, guarding, or rigidity.  Extremities: No edema.  Neuro: A & O x 3   Lab Results: Basic Metabolic Panel:  Recent Labs Lab 04/14/14 0615  04/18/14 0439 04/19/14 0436  NA  --   < > 138 135  K  --   < > 3.6 3.4*  CL  --   < > 105 102  CO2  --   < > 24 24  GLUCOSE  --   < > 93 92  BUN  --   < > <5* <5*  CREATININE  --   < > 0.72 0.67  CALCIUM  --   < > 8.7 8.3*  MG 2.0  --  1.8  --   PHOS 2.8  --   --   --   < > = values in this interval not displayed. Liver Function Tests:  Recent Labs Lab 04/13/14 1014  AST 24  ALT 15  ALKPHOS 90  BILITOT 0.6  PROT 6.4  ALBUMIN 3.5    Recent Labs Lab 04/13/14 1014  LIPASE 15   CBC:  Recent Labs Lab 04/13/14 1014  04/18/14 0439 04/19/14 0436  WBC 12.7*  < > 9.0 8.0  NEUTROABS 9.5*  --   --   --   HGB 13.7  < > 11.6* 11.2*  HCT 41.9  < > 34.6* 33.5*  MCV 94.6  < > 93.3 93.3  PLT 314  < >  263 272  < > = values in this interval not displayed. Urinalysis:  Recent Labs Lab 04/13/14 0959  COLORURINE YELLOW  LABSPEC 1.021  PHURINE 6.0  GLUCOSEU NEGATIVE  HGBUR TRACE*  BILIRUBINUR NEGATIVE  KETONESUR NEGATIVE  PROTEINUR NEGATIVE  UROBILINOGEN 1.0  NITRITE NEGATIVE  LEUKOCYTESUR NEGATIVE     Micro Results: Recent Results (from the past 240 hour(s))  Wet prep, genital     Status: Abnormal   Collection Time: 04/13/14 10:45 AM  Result Value Ref Range Status   Yeast Wet Prep HPF POC NONE SEEN NONE SEEN Final   Trich, Wet Prep FEW (A) NONE SEEN Final   Clue Cells Wet Prep HPF POC FEW (A) NONE SEEN Final   WBC, Wet Prep HPF POC FEW (A) NONE SEEN Final  MRSA PCR Screening     Status: None  Collection Time: 04/13/14 10:22 PM  Result Value Ref Range Status   MRSA by PCR NEGATIVE NEGATIVE Final    Comment:        The GeneXpert MRSA Assay (FDA approved for NASAL specimens only), is one component of a comprehensive MRSA colonization surveillance program. It is not intended to diagnose MRSA infection nor to guide or monitor treatment for MRSA infections.   Culture, blood (routine x 2)     Status: None (Preliminary result)   Collection Time: 04/14/14  9:57 PM  Result Value Ref Range Status   Specimen Description BLOOD LEFT ANTECUBITAL  Final   Special Requests BOTTLES DRAWN AEROBIC AND ANAEROBIC 10CC  Final   Culture   Final           BLOOD CULTURE RECEIVED NO GROWTH TO DATE CULTURE WILL BE HELD FOR 5 DAYS BEFORE ISSUING A FINAL NEGATIVE REPORT Note: Culture results may be compromised due to an excessive volume of blood received in culture bottles. Performed at [facility]    Report Status PENDING  Incomplete  Culture, blood (routine x 2)     Status: None (Preliminary result)   Collection Time: 04/14/14 10:00 PM  Result Value Ref Range Status   Specimen Description BLOOD LEFT HAND  Final   Special Requests BOTTLES DRAWN AEROBIC ONLY 10CC  Final    Culture   Final           BLOOD CULTURE RECEIVED NO GROWTH TO DATE CULTURE WILL BE HELD FOR 5 DAYS BEFORE ISSUING A FINAL NEGATIVE REPORT Performed at [facility]    Report Status PENDING  Incomplete   Studies/Results: Ct Abdomen Pelvis W Contrast  04/17/2014   CLINICAL DATA:  Fever a, nausea, diarrhea and left-sided abdominal pain. History of diverticulitis.  EXAM: CT ABDOMEN AND PELVIS WITH CONTRAST  TECHNIQUE: Multidetector CT imaging of the abdomen and pelvis was performed using the standard protocol following bolus administration of intravenous contrast.  CONTRAST:  130mL OMNIPAQUE IOHEXOL 300 MG/ML  SOLN  COMPARISON:  CT scan 04/13/2014  FINDINGS: The lower descending colon/upper sigmoid colon diverticulitis has progressed. There is a 3.7 x 3.1 cm abscess with significant surrounding inflammation.  The remainder the abdomen/pelvis is stable and unchanged. Solid organs are normal.  Stable left ovarian cyst.  IMPRESSION: Progression of diverticulitis involving the lower descending colon/upper sigmoid colon area with interval development of a 3.7 x 3.1 cm diverticular abscess.   Electronically Signed   By: Marijo Sanes M.D.   On: 04/17/2014 19:24   Medications: I have reviewed the patient's current medications. Scheduled Meds: . antiseptic oral rinse  7 mL Mouth Rinse q12n4p  . chlorhexidine  15 mL Mouth Rinse BID  . enoxaparin (LOVENOX) injection  40 mg Subcutaneous Q24H  . ertapenem  1 g Intravenous Q24H  . folic acid  1 mg Oral Daily  . metronidazole  500 mg Intravenous Q12H  . multivitamin with minerals  1 tablet Oral Daily  . nicotine  14 mg Transdermal Daily  . thiamine  100 mg Oral Daily   Or  . thiamine  100 mg Intravenous Daily   Continuous Infusions:   PRN Meds:.fentaNYL (SUBLIMAZE) injection, LORazepam **OR** LORazepam, ondansetron (ZOFRAN) IV, oxyCODONE Assessment/Plan:  Recurrent acute diverticulitis with abscess formation - Pt afebrile overnight with no  leukocytosis and improved pain with no peritoneal signs. CT abdomen 4/15 revealed progression of diverticulitis with 3.7cm x 3.1 cm abscess formation not amenable to drainage per Dr. Earleen Newport (IR). -Appreciate surgery  recommendations, awaiting further recs, possible repeat CT tomm or Tuesday vs outpatient in 1 week and possible change to PO antibiotics tomm -Advance diet from clears to low fiber per surgery recommendations     -Continue Invanz 1 g daily Day 5 and IV flagyl 500 mg BID Day 7/7 (for trichomonas/BV infection)   -Follow-up blood cultures x 2 from 4/12 -Change IV fentanyl 12.5 mcg Q 8 hr to Q 12 hr PRN pain -Change oxycodone IR 5 mg Q 3 hr PRN to 10 mg Q 4 PRN   -Zofran 4 mg Q 8 hr PRN nausea  -Appreciate nutrition consult for diverticulitis diet (low fiber on discharge with transition to eventual high fiber diet)  -Pt will need outpatient colonoscopy after resolution in 6-8 weeks to exclude malignancy  Hypokalemia - K 3.4 this AM. Mg 1.8 on 4/16. -Kdur 40 mEq given once -Continue to monitor BMP  Acute blood loss anemia - Pt with no active bleeding or hemodynamic instability. Hg 11.2 with Hg 13.7 on admission with no prior history of anemia. Pt with recent loose stools however FOBT negative.  -Continue to monitor CBC  Recurrent Bacterial Vaginosis and Trichomonas Vaginitis - Pt reports history of both in the past. Asymptomatic. Wet prep with clue cells and few trichomonas.  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 Pt is s/p azithromycin 1 g on 04/14/14. HIV negative.  -Pt aware that partner needs treatment as well  Alcohol Use - Pt reports drinking 40 oz alcohol daily.  -Continue CIWA protocol  -Pt counseled on cessation   Tobacco Use - Pt reports smoking 0.7 pack a day for past 15 years.  -Continue nicotine 14 mg patch daily -Pt counseled on cessation     HIV Screening - Negative  Diet: Low fiber DVT Ppx: Lovenox, SCD's Code: Full   Dispo: Disposition is deferred at this time, awaiting improvement of current medical problems.  Anticipated discharge in approximately 1-3 day(s).   The patient does have a current PCP Frederico Hamman, MD) and does need an [facility] hospital follow-up appointment after discharge.  The patient does not have transportation limitations that hinder transportation to clinic appointments.  .Services Needed at time of discharge: Y = Yes, Blank = No PT:   OT:   RN:   Equipment:   Other:     LOS: 6 days   Juluis Mire, MD 04/19/2014, 12:59 PM

## 2014-04-19 NOTE — Progress Notes (Signed)
Patient ID: Tina Riggs, female   DOB: 03/17/1980, 34 y.o.   MRN: [id number]     [facility]      [address] [place], [place] [address]    Phone: [phone] FAX: [phone]     Subjective: No BMs since yesterday morning. Dysuria has resolved.  VSS.  Afebrile.  Normal white count. Tolerated fulls.    Objective:  Vital signs:  Filed Vitals:   04/18/14 1702 04/18/14 1955 04/19/14 0609 04/19/14 0839  BP: 129/84 135/91 106/56 118/77  Pulse: 77 88 71 89  Temp: 98.8 F (37.1 C) 98.8 F (37.1 C) 98.7 F (37.1 C) 98 F (36.7 C)  TempSrc: Oral   Oral  Resp: _0 Height:      Weight:  112.038 kg (247 lb)    SpO2: 100% 100% 99% 100%    Last BM Date: 04/18/14  Intake/Output   Yesterday:  04/16 0701 - 04/17 0700 In: 0086 [P.O.:1680; I.V.:1760; IV Piggyback:250] Out: -  This shift:  Total I/O In: 642.5 [P.O.:360; I.V.:282.5] Out: 0    Physical Exam: General: Pt awake/alert/oriented x4 in no acute distress  Abdomen: Soft. Nondistended. mild TTP Left mid to low quadrant.  No evidence of peritonitis. No incarcerated hernias.   Problem List:   Principal Problem:   Diverticulitis of colon with perforation Active Problems:   Bacterial vaginosis   Tobacco use   Alcohol use   Chlamydia   Trichomonal vaginitis    Results:   Labs: Results for orders placed or performed during the hospital encounter of 04/13/14 (from the past 48 hour(s))  Basic metabolic panel     Status: Abnormal   Collection Time: 04/18/14  4:39 AM  Result Value Ref Range   Sodium 138 135 - 145 mmol/L   Potassium 3.6 3.5 - 5.1 mmol/L   Chloride 105 96 - 112 mmol/L   CO2 24 19 - 32 mmol/L   Glucose, Bld 93 70 - 99 mg/dL   BUN <5 (L) 6 - 23 mg/dL   Creatinine, Ser 0.72 0.50 - 1.10 mg/dL   Calcium 8.7 8.4 - 10.5 mg/dL   GFR calc non Af Amer >90 >90 mL/min   GFR calc Af Amer >90 >90 mL/min    Comment: (NOTE) The eGFR has been  calculated using the CKD EPI equation. This calculation has not been validated in all clinical situations. eGFR's persistently <90 mL/min signify possible Chronic Kidney Disease.    Anion gap 9 5 - 15  Magnesium     Status: None   Collection Time: 04/18/14  4:39 AM  Result Value Ref Range   Magnesium 1.8 1.5 - 2.5 mg/dL  CBC     Status: Abnormal   Collection Time: 04/18/14  4:39 AM  Result Value Ref Range   WBC 9.0 4.0 - 10.5 K/uL   RBC 3.71 (L) 3.87 - 5.11 MIL/uL   Hemoglobin 11.6 (L) 12.0 - 15.0 g/dL   HCT 34.6 (L) 36.0 - 46.0 %   MCV 93.3 78.0 - 100.0 fL   MCH 31.3 26.0 - 34.0 pg   MCHC 33.5 30.0 - 36.0 g/dL   RDW 13.3 11.5 - 15.5 %   Platelets 263 150 - 400 K/uL  Basic metabolic panel     Status: Abnormal   Collection Time: 04/19/14  4:36 AM  Result Value Ref Range   Sodium 135 135 - 145 mmol/L   Potassium 3.4 (L) 3.5 - 5.1  mmol/L   Chloride 102 96 - 112 mmol/L   CO2 24 19 - 32 mmol/L   Glucose, Bld 92 70 - 99 mg/dL   BUN <5 (L) 6 - 23 mg/dL   Creatinine, Ser 0.67 0.50 - 1.10 mg/dL   Calcium 8.3 (L) 8.4 - 10.5 mg/dL   GFR calc non Af Amer >90 >90 mL/min   GFR calc Af Amer >90 >90 mL/min    Comment: (NOTE) The eGFR has been calculated using the CKD EPI equation. This calculation has not been validated in all clinical situations. eGFR's persistently <90 mL/min signify possible Chronic Kidney Disease.    Anion gap 9 5 - 15  CBC     Status: Abnormal   Collection Time: 04/19/14  4:36 AM  Result Value Ref Range   WBC 8.0 4.0 - 10.5 K/uL   RBC 3.59 (L) 3.87 - 5.11 MIL/uL   Hemoglobin 11.2 (L) 12.0 - 15.0 g/dL   HCT 33.5 (L) 36.0 - 46.0 %   MCV 93.3 78.0 - 100.0 fL   MCH 31.2 26.0 - 34.0 pg   MCHC 33.4 30.0 - 36.0 g/dL   RDW 13.3 11.5 - 15.5 %   Platelets 272 150 - 400 K/uL    Imaging / Studies: Ct Abdomen Pelvis W Contrast  04/17/2014   CLINICAL DATA:  Fever a, nausea, diarrhea and left-sided abdominal pain. History of diverticulitis.  EXAM: CT ABDOMEN AND  PELVIS WITH CONTRAST  TECHNIQUE: Multidetector CT imaging of the abdomen and pelvis was performed using the standard protocol following bolus administration of intravenous contrast.  CONTRAST:  164m OMNIPAQUE IOHEXOL 300 MG/ML  SOLN  COMPARISON:  CT scan 04/13/2014  FINDINGS: The lower descending colon/upper sigmoid colon diverticulitis has progressed. There is a 3.7 x 3.1 cm abscess with significant surrounding inflammation.  The remainder the abdomen/pelvis is stable and unchanged. Solid organs are normal.  Stable left ovarian cyst.  IMPRESSION: Progression of diverticulitis involving the lower descending colon/upper sigmoid colon area with interval development of a 3.7 x 3.1 cm diverticular abscess.   Electronically Signed   By: PMarijo SanesM.D.   On: 04/17/2014 19:24    Medications / Allergies:  Scheduled Meds: . antiseptic oral rinse  7 mL Mouth Rinse q12n4p  . chlorhexidine  15 mL Mouth Rinse BID  . enoxaparin (LOVENOX) injection  40 mg Subcutaneous Q24H  . ertapenem  1 g Intravenous Q24H  . folic acid  1 mg Oral Daily  . metronidazole  500 mg Intravenous Q12H  . multivitamin with minerals  1 tablet Oral Daily  . nicotine  14 mg Transdermal Daily  . thiamine  100 mg Oral Daily   Or  . thiamine  100 mg Intravenous Daily   Continuous Infusions:  PRN Meds:.fentaNYL (SUBLIMAZE) injection, LORazepam **OR** LORazepam, ondansetron (ZOFRAN) IV, oxyCODONE  Antibiotics: Anti-infectives    Start     Dose/Rate Route Frequency Ordered Stop   04/15/14 1430  metroNIDAZOLE (FLAGYL) IVPB 500 mg     500 mg 100 mL/hr over 60 Minutes Intravenous Every 12 hours 04/15/14 1326 04/20/14 0229   04/15/14 1400  ertapenem (INVANZ) 1 g in sodium chloride 0.9 % 50 mL IVPB     1 g 100 mL/hr over 30 Minutes Intravenous Every 24 hours 04/15/14 1322     04/14/14 1000  azithromycin (ZITHROMAX) tablet 1,000 mg     1,000 mg Oral  Once 04/14/14 0837 04/14/14 1056   04/13/14 2300  metroNIDAZOLE (FLAGYL) IVPB 500 mg  Status:  Discontinued     500 mg 100 mL/hr over 60 Minutes Intravenous Every 8 hours 04/13/14 1502 04/15/14 1322   04/13/14 1515  cefTRIAXone (ROCEPHIN) 1 g in dextrose 5 % 50 mL IVPB - Premix  Status:  Discontinued     1 g 100 mL/hr over 30 Minutes Intravenous Every 24 hours 04/13/14 1502 04/15/14 1322   04/13/14 1315  metroNIDAZOLE (FLAGYL) IVPB 500 mg     500 mg 100 mL/hr over 60 Minutes Intravenous  Once 04/13/14 1314 04/13/14 1514   04/13/14 1315  ciprofloxacin (CIPRO) IVPB 400 mg  Status:  Discontinued     400 mg 200 mL/hr over 60 Minutes Intravenous  Once 04/13/14 1314 04/13/14 1502        Assessment/Plan HD#6 Sigmoid diverticulitis with microperforation, now a phlegmon  -CT scan shows a 3.7x3.1cm abscess. Dr. Earleen Newport reviewed the CT and stated its mostly phlegmonous/air and no fluid to drain. clinically looks great, tolerating fulls and hasnt had pain meds since midnight.   -advance to low fiber -Will clarify with attending whether to change to PO antibiotics and discharge home tomorrow with a repeat CT in 1 week versus repeating a CT on Monday or Tuesday.   Colbert Ewing D#4 -seen by dietician  -will need a colonoscopy in 6-8 weeks -mobilize -SCD/lovenox  BV/trich/chlamydia  Tobacco and alcohol use--strongly encourage cessation    Erby Pian, [facility] Surgery Pager [phone](7A-4:30P) For consults and floor pages call [phone](7A-4:30P)  04/19/2014 11:01 AM

## 2014-04-20 ENCOUNTER — Other Ambulatory Visit: Payer: Self-pay | Admitting: Surgery

## 2014-04-20 ENCOUNTER — Encounter: Payer: Self-pay | Admitting: Internal Medicine

## 2014-04-20 DIAGNOSIS — K5792 Diverticulitis of intestine, part unspecified, without perforation or abscess without bleeding: Secondary | ICD-10-CM

## 2014-04-20 LAB — BASIC METABOLIC PANEL
Anion gap: 10 (ref 5–15)
BUN: 7 mg/dL (ref 6–23)
CHLORIDE: 103 mmol/L (ref 96–112)
CO2: 23 mmol/L (ref 19–32)
Calcium: 8.6 mg/dL (ref 8.4–10.5)
Creatinine, Ser: 0.67 mg/dL (ref 0.50–1.10)
GFR calc Af Amer: >90 mL/min (ref >90)
GFR calc non Af Amer: >90 mL/min (ref >90)
GLUCOSE: 98 mg/dL (ref 70–99)
POTASSIUM: 3.8 mmol/L (ref 3.5–5.1)
SODIUM: 136 mmol/L (ref 135–145)

## 2014-04-20 MED ORDER — DIPHENHYDRAMINE HCL 25 MG PO CAPS
25.0000 mg | ORAL_CAPSULE | Freq: Four times a day (QID) | ORAL | Status: DC | PRN
  Administered 2014-04-20: 25 mg via ORAL
  Filled 2014-04-20: qty 1

## 2014-04-20 MED ORDER — METRONIDAZOLE 500 MG PO TABS: 500.0000 mg | ORAL_TABLET | Freq: Two times a day (BID) | ORAL | Status: DC

## 2014-04-20 MED ORDER — NICOTINE 14 MG/24HR TD PT24: 14.0000 mg | MEDICATED_PATCH | Freq: Every day | TRANSDERMAL | Status: DC

## 2014-04-20 MED ORDER — OXYCODONE-ACETAMINOPHEN 5-325 MG PO TABS: 1.0000 | ORAL_TABLET | Freq: Three times a day (TID) | ORAL | Status: DC | PRN

## 2014-04-20 MED ORDER — CIPROFLOXACIN HCL 500 MG PO TABS: 500.0000 mg | ORAL_TABLET | Freq: Two times a day (BID) | ORAL | Status: DC

## 2014-04-20 MED ORDER — DIPHENHYDRAMINE HCL 50 MG/ML IJ SOLN: 12.5000 mg | Freq: Four times a day (QID) | INTRAMUSCULAR | Status: DC | PRN

## 2014-04-20 MED ORDER — DIPHENHYDRAMINE HCL 25 MG PO CAPS: 25.0000 mg | ORAL_CAPSULE | Freq: Four times a day (QID) | ORAL | Status: DC | PRN

## 2014-04-20 NOTE — Progress Notes (Signed)
Patient ID: Tina Riggs, female   DOB: Nov 17, 1980, 34 y.o.   MRN: [id number]      [facility]      [address] [place], [place] [address]    Phone: [phone] FAX: [phone]     Subjective: No n/v.  Improved pain.  Tolerating POs.  VSS.  Afebrile.  Likes pain medicine  Objective:  Vital signs:  Filed Vitals:   04/19/14 1656 04/19/14 2124 04/20/14 0450 04/20/14 0816  BP: 113/62 128/108 117/81 116/67  Pulse: 83 73 81 91  Temp: 98.9 F (37.2 C) 98.7 F (37.1 C) 98.8 F (37.1 C) 98.9 F (37.2 C)  TempSrc: Oral   Oral  Resp: $Remo'17 18 18 18  'qZTUC$ Height:      Weight:      SpO2: 100% 100% 100% 100%    Last BM Date: 04/19/14  Intake/Output   Yesterday:  04/17 0701 - 04/18 0700 In: 1512.5 [P.O.:1080; I.V.:282.5; IV Piggyback:150] Out: 0  This shift:    I/O last 3 completed shifts: In: 3232.5 [P.O.:1800; I.V.:1182.5; IV Piggyback:250] Out: 0    Physical Exam: General: Pt awake/alert/oriented x4 in no acute distress  Abdomen: Soft. Nondistended. minimal TTP Left mid to upper quadrant. No evidence of peritonitis. No incarcerated hernias.   Problem List:   Principal Problem:   Diverticulitis of colon with perforation Active Problems:   Bacterial vaginosis   Tobacco use   Alcohol use   Chlamydia   Trichomonal vaginitis    Results:   Labs: Results for orders placed or performed during the hospital encounter of 04/13/14 (from the past 48 hour(s))  Basic metabolic panel     Status: Abnormal   Collection Time: 04/19/14  4:36 AM  Result Value Ref Range   Sodium 135 135 - 145 mmol/L   Potassium 3.4 (L) 3.5 - 5.1 mmol/L   Chloride 102 96 - 112 mmol/L   CO2 24 19 - 32 mmol/L   Glucose, Bld 92 70 - 99 mg/dL   BUN <5 (L) 6 - 23 mg/dL   Creatinine, Ser 0.67 0.50 - 1.10 mg/dL   Calcium 8.3 (L) 8.4 - 10.5 mg/dL   GFR calc non Af Amer >90 >90 mL/min   GFR calc Af Amer >90 >90 mL/min    Comment: (NOTE) The eGFR  has been calculated using the CKD EPI equation. This calculation has not been validated in all clinical situations. eGFR's persistently <90 mL/min signify possible Chronic Kidney Disease.    Anion gap 9 5 - 15  CBC     Status: Abnormal   Collection Time: 04/19/14  4:36 AM  Result Value Ref Range   WBC 8.0 4.0 - 10.5 K/uL   RBC 3.59 (L) 3.87 - 5.11 MIL/uL   Hemoglobin 11.2 (L) 12.0 - 15.0 g/dL   HCT 33.5 (L) 36.0 - 46.0 %   MCV 93.3 78.0 - 100.0 fL   MCH 31.2 26.0 - 34.0 pg   MCHC 33.4 30.0 - 36.0 g/dL   RDW 13.3 11.5 - 15.5 %   Platelets 272 150 - 400 K/uL  Basic metabolic panel     Status: None   Collection Time: 04/20/14  5:32 AM  Result Value Ref Range   Sodium 136 135 - 145 mmol/L   Potassium 3.8 3.5 - 5.1 mmol/L   Chloride 103 96 - 112 mmol/L   CO2 23 19 - 32 mmol/L   Glucose, Bld 98 70 - 99 mg/dL  BUN 7 6 - 23 mg/dL   Creatinine, Ser 0.67 0.50 - 1.10 mg/dL   Calcium 8.6 8.4 - 10.5 mg/dL   GFR calc non Af Amer >90 >90 mL/min   GFR calc Af Amer >90 >90 mL/min    Comment: (NOTE) The eGFR has been calculated using the CKD EPI equation. This calculation has not been validated in all clinical situations. eGFR's persistently <90 mL/min signify possible Chronic Kidney Disease.    Anion gap 10 5 - 15    Imaging / Studies: No results found.  Medications / Allergies:  Scheduled Meds: . antiseptic oral rinse  7 mL Mouth Rinse q12n4p  . chlorhexidine  15 mL Mouth Rinse BID  . enoxaparin (LOVENOX) injection  40 mg Subcutaneous Q24H  . ertapenem  1 g Intravenous Q24H  . folic acid  1 mg Oral Daily  . multivitamin with minerals  1 tablet Oral Daily  . nicotine  14 mg Transdermal Daily  . thiamine  100 mg Oral Daily   Or  . thiamine  100 mg Intravenous Daily   Continuous Infusions:  PRN Meds:.diphenhydrAMINE **OR** diphenhydrAMINE, fentaNYL (SUBLIMAZE) injection, ondansetron (ZOFRAN) IV, oxyCODONE  Antibiotics: Anti-infectives    Start     Dose/Rate Route Frequency  Ordered Stop   04/15/14 1430  metroNIDAZOLE (FLAGYL) IVPB 500 mg     500 mg 100 mL/hr over 60 Minutes Intravenous Every 12 hours 04/15/14 1326 04/19/14 1542   04/15/14 1400  ertapenem (INVANZ) 1 g in sodium chloride 0.9 % 50 mL IVPB     1 g 100 mL/hr over 30 Minutes Intravenous Every 24 hours 04/15/14 1322     04/14/14 1000  azithromycin (ZITHROMAX) tablet 1,000 mg     1,000 mg Oral  Once 04/14/14 0837 04/14/14 1056   04/13/14 2300  metroNIDAZOLE (FLAGYL) IVPB 500 mg  Status:  Discontinued     500 mg 100 mL/hr over 60 Minutes Intravenous Every 8 hours 04/13/14 1502 04/15/14 1322   04/13/14 1515  cefTRIAXone (ROCEPHIN) 1 g in dextrose 5 % 50 mL IVPB - Premix  Status:  Discontinued     1 g 100 mL/hr over 30 Minutes Intravenous Every 24 hours 04/13/14 1502 04/15/14 1322   04/13/14 1315  metroNIDAZOLE (FLAGYL) IVPB 500 mg     500 mg 100 mL/hr over 60 Minutes Intravenous  Once 04/13/14 1314 04/13/14 1514   04/13/14 1315  ciprofloxacin (CIPRO) IVPB 400 mg  Status:  Discontinued     400 mg 200 mL/hr over 60 Minutes Intravenous  Once 04/13/14 1314 04/13/14 1502       Assessment/Plan HD#7 Sigmoid diverticulitis with microperforation, now a phlegmon  -pain and exam greatly improved.  Normal white count 4/17.  Afebrile.  Stable for discharge home.  Needs a repeat CT of abdomen and pelvis in 1 week and then a follow up with Dr. Brantley Stage.  Colbert Ewing D#5.  Allergic to PCN can have cipro/flagyl and needs total of 14 days of antibiotics  -seen by dietician  -will need a colonoscopy in 6-8 weeks BV/trich/chlamydia  Tobacco and alcohol use--strongly encourage cessation   Erby Pian, [facility] Pager [phone](7A-4:30P) For consults and floor pages call [phone](7A-4:30P)  04/20/2014 8:52 AM

## 2014-04-20 NOTE — Progress Notes (Signed)
Nutrition Brief Note  RD consulted for diet education for diverticulitis. Noted pt was given diet education on 4/12 with handout "Low fiber Nutrition Therapy" given. During time of visit, pt had no further questions and did not need re-education. Pt was reviewed to be on a low fiber diet for 1 week and then evantually transitioning to a high fiber diet. Pt to be discharged today.   Kallie Locks, MS, RD, LDN Pager # [phone] After hours/ weekend pager # [phone]

## 2014-04-20 NOTE — Progress Notes (Signed)
Subjective:  Pt seen and examined in AM. No acute events overnight. She was transitioned to soft diet yesterday. She no longer has left sided abdominal pain and denies fever, chills, nausea, vomiting, loose stools, or urinary symptoms.    Objective: Vital signs in last 24 hours: Filed Vitals:   04/19/14 1656 04/19/14 2124 04/20/14 0450 04/20/14 0816  BP: 113/62 128/108 117/81 116/67  Pulse: 83 73 81 91  Temp: 98.9 F (37.2 C) 98.7 F (37.1 C) 98.8 F (37.1 C) 98.9 F (37.2 C)  TempSrc: Oral   Oral  Resp: 17 18 18 18   Height:      Weight:      SpO2: 100% 100% 100% 100%   Weight change:   Intake/Output Summary (Last 24 hours) at 04/20/14 1219 Last data filed at 04/20/14 1056  Gross per 24 hour  Intake   1230 ml  Output      0 ml  Net   1230 ml   PHYSICAL EXAMINATION:  General: NAD Heart: Normal rate and rhythm  Lungs: Clear to auscultation. No wheezing, ronchi, or rales. Abdomen: Soft, non-distended, non-tender. Normal BS. No rebound, guarding, or rigidity.  Extremities: No edema.  Neuro: A & O x 3   Lab Results: Basic Metabolic Panel:  Recent Labs Lab 04/14/14 0615  04/18/14 0439 04/19/14 0436 04/20/14 0532  NA  --   < > 138 135 136  K  --   < > 3.6 3.4* 3.8  CL  --   < > 105 102 103  CO2  --   < > 24 24 23   GLUCOSE  --   < > 93 92 98  BUN  --   < > <5* <5* 7  CREATININE  --   < > 0.72 0.67 0.67  CALCIUM  --   < > 8.7 8.3* 8.6  MG 2.0  --  1.8  --   --   PHOS 2.8  --   --   --   --   < > = values in this interval not displayed. CBC:  Recent Labs Lab 04/18/14 0439 04/19/14 0436  WBC 9.0 8.0  HGB 11.6* 11.2*  HCT 34.6* 33.5*  MCV 93.3 93.3  PLT 263 272    Micro Results: Recent Results (from the past 240 hour(s))  Wet prep, genital     Status: Abnormal   Collection Time: 04/13/14 10:45 AM  Result Value Ref Range Status   Yeast Wet Prep HPF POC NONE SEEN NONE SEEN Final   Trich, Wet Prep FEW (A) NONE SEEN Final   Clue Cells Wet Prep  HPF POC FEW (A) NONE SEEN Final   WBC, Wet Prep HPF POC FEW (A) NONE SEEN Final  MRSA PCR Screening     Status: None   Collection Time: 04/13/14 10:22 PM  Result Value Ref Range Status   MRSA by PCR NEGATIVE NEGATIVE Final    Comment:        The GeneXpert MRSA Assay (FDA approved for NASAL specimens only), is one component of a comprehensive MRSA colonization surveillance program. It is not intended to diagnose MRSA infection nor to guide or monitor treatment for MRSA infections.   Culture, blood (routine x 2)     Status: None (Preliminary result)   Collection Time: 04/14/14  9:57 PM  Result Value Ref Range Status   Specimen Description BLOOD LEFT ANTECUBITAL  Final   Special Requests BOTTLES DRAWN AEROBIC AND ANAEROBIC 10CC  Final  Culture   Final           BLOOD CULTURE RECEIVED NO GROWTH TO DATE CULTURE WILL BE HELD FOR 5 DAYS BEFORE ISSUING A FINAL NEGATIVE REPORT Note: Culture results may be compromised due to an excessive volume of blood received in culture bottles. Performed at [facility]    Report Status PENDING  Incomplete  Culture, blood (routine x 2)     Status: None (Preliminary result)   Collection Time: 04/14/14 10:00 PM  Result Value Ref Range Status   Specimen Description BLOOD LEFT HAND  Final   Special Requests BOTTLES DRAWN AEROBIC ONLY 10CC  Final   Culture   Final           BLOOD CULTURE RECEIVED NO GROWTH TO DATE CULTURE WILL BE HELD FOR 5 DAYS BEFORE ISSUING A FINAL NEGATIVE REPORT Performed at [facility]    Report Status PENDING  Incomplete   Studies/Results: No results found. Medications: I have reviewed the patient's current medications. Scheduled Meds: . antiseptic oral rinse  7 mL Mouth Rinse q12n4p  . chlorhexidine  15 mL Mouth Rinse BID  . enoxaparin (LOVENOX) injection  40 mg Subcutaneous Q24H  . ertapenem  1 g Intravenous Q24H  . folic acid  1 mg Oral Daily  . multivitamin with minerals  1 tablet Oral Daily  .  nicotine  14 mg Transdermal Daily  . thiamine  100 mg Oral Daily   Or  . thiamine  100 mg Intravenous Daily   Continuous Infusions:   PRN Meds:.diphenhydrAMINE **OR** diphenhydrAMINE, fentaNYL (SUBLIMAZE) injection, ondansetron (ZOFRAN) IV, oxyCODONE Assessment/Plan:  Recurrent acute diverticulitis with abscess formation - Tolerated soft diet yesterday with no pain.  Pt afebrile overnight with no leukocytosis. CT abdomen 4/15 revealed progression of diverticulitis with 3.7cm x 3.1 cm abscess formation not amenable to drainage per Dr. Earleen Newport (IR). -Appreciate surgery recommendations, repeat CT outpatient in 1 week and follow-up with Dr. Brantley Stage -Low fiber diet on discharge then high fiber per dietician recommendations     -Transition Invanz 1 g daily Day 6 to PO cipro 500 mg BID and flagyl 500 mg BID for 7 days (total 14 day antibiotics) per surgery    -Follow-up blood cultures x 2 from 4/12 (NGTD) -Change IV fentanyl 12.5 mcg Q 8 hr to Q 12 hr PRN pain -Change oxycodone IR 5 mg Q 3 hr PRN to 10 mg Q 4 PRN   -Pt will need outpatient colonoscopy after resolution in 6-8 weeks to exclude malignancy  Acute blood loss anemia - Pt with no active bleeding or hemodynamic instability. Hg 11.2 yesterday with Hg 13.7 on admission with no prior history of anemia. Pt with recent loose stools however FOBT negative.  -Check CBC as outpatient   Recurrent Bacterial Vaginosis and Trichomonas Vaginitis - Pt reports history of both in the past. Asymptomatic. Wet prep with clue cells and few trichomonas.Pt is s/p 7 days of treatment with IV flagyl 500 mg BID. 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 Pt is s/p azithromycin 1 g on 04/14/14. HIV negative.  -Pt aware that partner needs treatment as well  Alcohol Use - Pt reports drinking 40 oz alcohol daily.  -Pt counseled on cessation   Tobacco Use - Pt reports smoking 0.7 pack a  day for past 15 years.  -Continue nicotine 14 mg patch daily on discharge -Pt counseled on cessation   HIV  Screening - Negative  Diet: Low fiber DVT Ppx: Lovenox, SCD's Code: Full   Dispo: Today   The patient does have a current PCP Frederico Hamman, MD) and does need an [facility] hospital follow-up appointment after discharge.  The patient does not have transportation limitations that hinder transportation to clinic appointments.  .Services Needed at time of discharge: Y = Yes, Blank = No PT:   OT:   RN:   Equipment:   Other:     LOS: 7 days   Juluis Mire, MD 04/20/2014, 12:19 PM

## 2014-04-20 NOTE — Progress Notes (Signed)
AVS discharge instructions were reviewed with patient. Patient was given percocet prescription to take to her pharmacy. Patient was also told to pick her other  prescriptions from her pharmacy at [facility] in [place] (Cipro,Flagyl, and Nicoderm). Patient stated that she did not have any questions. Staff assisted patient to her transportation.

## 2014-04-20 NOTE — Progress Notes (Signed)
Subjective: Patient complained of itching overnight, resolved with PO Benadryl. Otherwise, no events overnight. Patient is resting comfortably in a chair. Reports her pain is much improved and she is ready to go home. Has tolerated a diet.   Objective: Vital signs in last 24 hours: Filed Vitals:   04/19/14 1656 04/19/14 2124 04/20/14 0450 04/20/14 0816  BP: 113/62 128/108 117/81 116/67  Pulse: 83 73 81 91  Temp: 98.9 F (37.2 C) 98.7 F (37.1 C) 98.8 F (37.1 C) 98.9 F (37.2 C)  TempSrc: Oral   Oral  Resp: 17 18 18 18   Height:      Weight:      SpO2: 100% 100% 100% 100%   Weight change:   Intake/Output Summary (Last 24 hours) at 04/20/14 1217 Last data filed at 04/20/14 1056  Gross per 24 hour  Intake   1230 ml  Output      0 ml  Net   1230 ml   General: resting in chair HEENT: PERRL, EOMI, no scleral icterus Cardiac: RRR, no rubs, murmurs or gallops Pulm: clear to auscultation bilaterally, moving normal volumes of air Abd: soft, nontender with deep palpation, nondistended, BS present Ext: warm and well perfused, no pedal edema Neuro: alert and oriented X3, cranial nerves II-XII grossly intact  Lab Results: Basic Metabolic Panel:  Recent Labs  04/18/14 0439 04/19/14 0436 04/20/14 0532  NA 138 135 136  K 3.6 3.4* 3.8  CL 105 102 103  CO2 24 24 23   GLUCOSE 93 92 98  BUN <5* <5* 7  CREATININE 0.72 0.67 0.67  CALCIUM 8.7 8.3* 8.6  MG 1.8  --   --    CBC:  Recent Labs  04/18/14 0439 04/19/14 0436  WBC 9.0 8.0  HGB 11.6* 11.2*  HCT 34.6* 33.5*  MCV 93.3 93.3  PLT 263 272   Micro Results: Recent Results (from the past 240 hour(s))  Wet prep, genital     Status: Abnormal   Collection Time: 04/13/14 10:45 AM  Result Value Ref Range Status   Yeast Wet Prep HPF POC NONE SEEN NONE SEEN Final   Trich, Wet Prep FEW (A) NONE SEEN Final   Clue Cells Wet Prep HPF POC FEW (A) NONE SEEN Final   WBC, Wet Prep HPF POC FEW (A) NONE SEEN Final  MRSA PCR  Screening     Status: None   Collection Time: 04/13/14 10:22 PM  Result Value Ref Range Status   MRSA by PCR NEGATIVE NEGATIVE Final    Comment:        The GeneXpert MRSA Assay (FDA approved for NASAL specimens only), is one component of a comprehensive MRSA colonization surveillance program. It is not intended to diagnose MRSA infection nor to guide or monitor treatment for MRSA infections.   Culture, blood (routine x 2)     Status: None (Preliminary result)   Collection Time: 04/14/14  9:57 PM  Result Value Ref Range Status   Specimen Description BLOOD LEFT ANTECUBITAL  Final   Special Requests BOTTLES DRAWN AEROBIC AND ANAEROBIC 10CC  Final   Culture   Final           BLOOD CULTURE RECEIVED NO GROWTH TO DATE CULTURE WILL BE HELD FOR 5 DAYS BEFORE ISSUING A FINAL NEGATIVE REPORT Note: Culture results may be compromised due to an excessive volume of blood received in culture bottles. Performed at [facility]    Report Status PENDING  Incomplete  Culture, blood (routine x  2)     Status: None (Preliminary result)   Collection Time: 04/14/14 10:00 PM  Result Value Ref Range Status   Specimen Description BLOOD LEFT HAND  Final   Special Requests BOTTLES DRAWN AEROBIC ONLY 10CC  Final   Culture   Final           BLOOD CULTURE RECEIVED NO GROWTH TO DATE CULTURE WILL BE HELD FOR 5 DAYS BEFORE ISSUING A FINAL NEGATIVE REPORT Performed at [facility]    Report Status PENDING  Incomplete   Studies/Results: No results found. Medications: I have reviewed the patient's current medications. Scheduled Meds: . antiseptic oral rinse  7 mL Mouth Rinse q12n4p  . chlorhexidine  15 mL Mouth Rinse BID  . enoxaparin (LOVENOX) injection  40 mg Subcutaneous Q24H  . ertapenem  1 g Intravenous Q24H  . folic acid  1 mg Oral Daily  . multivitamin with minerals  1 tablet Oral Daily  . nicotine  14 mg Transdermal Daily  . thiamine  100 mg Oral Daily   Or  . thiamine  100 mg  Intravenous Daily   Continuous Infusions:  PRN Meds:.diphenhydrAMINE **OR** diphenhydrAMINE, fentaNYL (SUBLIMAZE) injection, ondansetron (ZOFRAN) IV, oxyCODONE Assessment/Plan: Principal Problem:   Diverticulitis of colon with perforation Active Problems:   Bacterial vaginosis   Tobacco use   Alcohol use   Chlamydia   Trichomonal vaginitis  Recurrent acute diverticulitis with abscess formation: Day 5 Invanz (received two days IV ceftriaxone/metronidazole). CT on 4/16 demonstrated 3.7x3.1 abscess. Afebrile last 24 hours, no signs of sepsis (no tachycardia, tachypnea, normal WBC). Blood cultures from 4/12 negative. Abdominal pain significantly improved. Received one dose of fentanyl 12.68mcg on 4/17. Used 2 doses of oxycodone 10mg . Per surgery recs, patient stable for discharge home. Patient counseled regarding contraindication of ETOH use while on metronidazole.  - repeat CT in one week - transition to PO abx (PO Ciprofloxacin 500mg  BID; PO metronidazole 500mg  BID) - con't low-fiber diet - percocet 5-235 mg q8hrs prn  - will need colonscopy 6-8 weeks after d/c  BV & trichominiasis: Clue cells and few trich detected on wet prep. Completed 7-day course of metronidazole 500mg  BID.   Chlamydia: Patient reports having had chlamydia in 2008. Denies any urinary sx or vaginal discharge.  - encouraged patient to have partner seek treatment at health department  Alcohol use: Reports 40-120oz beer/daily (variable report by provider).  - continue CIWA protocol  Tobacco use: 3/4 PPD x 9 years.  - con't nicotine patch  Dispo: Patient stable for discharge  The patient does have a current PCP Frederico Hamman, MD) and does need an [facility] hospital follow-up appointment after discharge.  The patient does not have transportation limitations that hinder transportation to clinic appointments. .Services Needed at time of discharge: Y = Yes, Blank = No PT:   OT:   RN:   Equipment:   Other:      LOS: 7 days   Carolan Shiver, Med Student 04/20/2014, 12:17 PM

## 2014-04-20 NOTE — Discharge Instructions (Signed)
-  Start taking flagyl 500 mg  twice a day for 7 days, starting today -Start taking ciprofloxacin 500 mg twice a day for 7 days, starting today -Follow the diet recommended the dietician -Take percocet every 8 hrs as needed for pain  -Please attend your follow-up appointment with surgery in 1 week  -Please also follow-up with your primary care physician in 1-2 weeks -You will need a colonoscopy in 6-8 weeks  -Place nicotine patch daily for 1 month  -We will give a work note -Glad you are doing better!    Diverticulitis Diverticulitis is when small pockets that have formed in your colon (large intestine) become infected or swollen. HOME CARE  Follow your doctor's instructions.  Follow a special diet if told by your doctor.  When you feel better, your doctor may tell you to change your diet. You may be told to eat a lot of fiber. Fruits and vegetables are good sources of fiber. Fiber makes it easier to poop (have bowel movements).  Take supplements or probiotics as told by your doctor.  Only take medicines as told by your doctor.  Keep all follow-up visits with your doctor. GET HELP IF:  Your pain does not get better.  You have a hard time eating food.  You are not pooping like normal. GET HELP RIGHT AWAY IF:  Your pain gets worse.  Your problems do not get better.  Your problems suddenly get worse.  You have a fever.  You keep throwing up (vomiting).  You have bloody or black, tarry poop (stool). MAKE SURE YOU:   Understand these instructions.  Will watch your condition.  Will get help right away if you are not doing well or get worse. Document Released: 06/07/2007 Document Revised: 12/24/2012 Document Reviewed: 11/13/2012 [facility] Patient Information 2015 [place], [place]. This information is not intended to replace advice given to you by your health care provider. Make sure you discuss any questions you have with your health care provider.

## 2014-04-20 NOTE — Discharge Summary (Signed)
Name: Tina Riggs MRN: [id number] DOB: 04-25-1980 34 y.o. PCP: Frederico Hamman, MD  Date of Admission: 04/13/2014  9:33 AM Date of Discharge: 04/20/2014 Attending Physician: Alyson Locket. 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 One po bid x 7 days     metroNIDAZOLE 500 MG tablet  Commonly known as:  FLAGYL  Take 1 tablet (500 mg total) by mouth 2 (two) times daily. One po bid x 7 days     nicotine 14 mg/24hr patch  Commonly known as:  NICODERM CQ - dosed in mg/24 hours  Place 1 patch (14 mg total) onto the skin daily.     oxyCODONE-acetaminophen 5-325 MG per tablet  Commonly known as:  PERCOCET  Take 1 tablet by mouth every 8 (eight) hours as needed.        Disposition and follow-up:   Ms.Yuridia E Tardiff was discharged from [facility] in Good condition.  At the hospital follow up visit please address:  1.   Resolution of diverticulitis w/absccess - pain control with percoet, transition from low fiber to high fiber diet, compliance with flagyl and ciprofloxacin for 7 days. To have repeat CT abdomen in 1 week (04/27/14) and follow-up with general surgery in 2 weeks. She needs colonoscopy in 6-8 weeks after resolution of flare to exclude malignancy and IBD.   Anemia - Hg 11.2 on discharge, no prior history of anemia and  FOBT was negative  Tobacco cessation - Pt given nicotine 21 mg patch for 1 month supply, please reassess    Her partner needs to be treated for trichomonas and chlamydia infections  Left Ovarian cyst - stable on  imaging, please determine if needs further evaluation    2.  Labs / imaging needed at time of follow-up: CT abdomen in 1 week on 04/27/14, CBC (Hg)  3.  Pending labs/ test needing follow-up: Blood cultures x 2 (came back with no growth)  Follow-up Appointments: Follow-up Information    Follow up with CORNETT,THOMAS A., MD In 2 weeks.   Specialty:  General Surgery   Contact information:   [address] [place] [address] [phone]       Follow up with MARSHALL,BERNARD A, MD In 1 week.   Specialty:  Obstetrics and Gynecology   Contact information:   [address] [place] [address] [phone]       Discharge Instructions:   -Start taking flagyl 500 mg  twice a day for 7 days, starting today -Start taking ciprofloxacin 500 mg twice a day for 7 days, starting today -Follow the diet recommended the dietician -Take percocet every 8 hrs as needed for pain  -Please attend your follow-up appointment with surgery in 1 week  -Please also follow-up with your primary care physician in 1-2 weeks -You will need a colonoscopy in 6-8 weeks  -Place nicotine patch daily for 1 month  -We will give a work note -Glad you are doing better!   Consultations: General Surgery  Procedures Performed:  Ct Abdomen Pelvis W Contrast  04/17/2014   CLINICAL DATA:  Fever a, nausea, diarrhea and left-sided abdominal pain. History of diverticulitis.  EXAM: CT ABDOMEN AND PELVIS WITH CONTRAST  TECHNIQUE: Multidetector CT imaging of the abdomen and pelvis was performed using the standard protocol following bolus administration of intravenous contrast.  CONTRAST:  160mL OMNIPAQUE IOHEXOL 300 MG/ML  SOLN  COMPARISON:  CT scan 04/13/2014  FINDINGS: The lower descending colon/upper sigmoid colon diverticulitis has progressed. There is a 3.7 x 3.1 cm abscess with significant surrounding inflammation.  The remainder the abdomen/pelvis is stable and unchanged. Solid organs are  normal.  Stable left ovarian cyst.  IMPRESSION: Progression of diverticulitis involving the lower descending colon/upper sigmoid colon area with interval development of a 3.7 x 3.1 cm diverticular abscess.   Electronically Signed   By: Marijo Sanes M.D.   On: 04/17/2014 19:24   Ct Abdomen Pelvis W Contrast  04/13/2014   CLINICAL DATA:  Abdominal pain  EXAM: CT ABDOMEN AND PELVIS WITH CONTRAST  TECHNIQUE: Multidetector CT imaging of the abdomen and pelvis was performed using the standard protocol following bolus administration of intravenous contrast. Oral contrast was also administered.  CONTRAST:  167mL OMNIPAQUE IOHEXOL 300 MG/ML  SOLN  COMPARISON:  July 13, 2012  FINDINGS: Lung bases are clear.  No focal liver lesions are identified. Gallbladder wall is not appreciably thickened. A tiny gallstone is seen within the gallbladder. There is no biliary duct dilatation.  Spleen, pancreas, and adrenals appear normal. Kidneys bilaterally show no mass or hydronephrosis on either side. There is no renal or ureteral calculus on either side.  In the pelvis, urinary bladder is midline with normal wall thickness. There is a cyst arising from the left ovary measuring 4.3 x 3.5 cm. There is no other pelvic mass. There is no pelvic fluid collection.  There is inflammation in the proximal sigmoid colon near the rectosigmoid junction consistent with focal diverticulitis. No abscess is seen in this area. A tiny focus of air is noted within this collection suggesting a minimal microperforation. This tiny focus of air is seen on axial slice 53 series 2.  The appendix appears normal. There is no bowel obstruction. There is no free air beyond the tiny microperforation seen in the region of the sigmoid diverticulitis. There is no portal venous air.  There is no ascites, adenopathy, or abscess in the abdomen or pelvis. Aorta appears unremarkable. There are no blastic or lytic bone lesions.  IMPRESSION: Diverticulitis in the proximal  sigmoid colon near the rectosigmoid junction with a tiny focus of microperforation. No abscess.  Cyst arising from the left ovary.  No bowel obstruction. No abscess. Appendix appears normal. No renal or ureteral calculus. No hydronephrosis.  Tiny gallstone in the gallbladder. Gallbladder wall does not appear thickened.   Electronically Signed   By: Lowella Grip III M.D.   On: 04/13/2014 12:29     Admission HPI: Original Zenaida Niece, MD  Lenor Coffin is pleasant 58 year old woman with past medical history of HPV infection, diverticulitis, and tobacco/alcohol use who presents with abdominal pain.   She had episode of acute diverticulitis July 2014 that required ED admission and was treated with outpatient course of ciprofloxacin and flagyl. She did not follow-up for colonoscopy.   She was feeling her normal self until a few weeks ago when she began having left sided and lower abdominal pain that she describes as pressure with few episodes of vomiting. She also  had diarrhea with possible bloody movements (was eating beets at the time) and gaseous distension. Her pain has worsened in the past day and has difficulty lying on her side or walking. She reports subjective fever but denies nausea, vomiting, weight loss, change in appetite, change in BM, or urinary symptoms. She denies chronic constipation and does not consume a lot of fiber in her diet. She has not been taking any pain medications at home. She has been able to tolerate PO intake and ate noodles today. She reports drinking 40 oz alcohol daily.   Her last menstrual period was the 15th of last month and reports it is usually heavy. She has 1 sexual partner since December and denies vaginal discharge or odor. She reports having history of BV and trichomonas in the past which she was treated for. She also has history of HPV.   Hospital Course by problem list:   Recurrent Acute Diverticulitis - Pt with 1st episode of  diverticulitis July 2014 that required ED admission and was treated with outpatient course of antibiotics. She did not follow-up for colonoscopy. She presented on current admission with 2-3 week history of left sided abdominal pain with few episodes of vomiting, possible bloody diarrhea, gaseous distension, subjective fevers, and urinary symptoms that worsened on day prior to admission. She denied chronic constipation but reported low-fiber diet. On admission she had mild leukocytosis (12.7K) and was febrile (Tmax  100.9). CT abdomen/pelvis on 04/13/14 revealed diverticulitis in the proximal sigmoid colon near the rectosigmoid junction with a tiny focus of microperforation with no abscess. She was started on IV ceftriaxone and IV flagyl which was changed to broad spectrum Invanz (ertapenem) after two days due to minimal clinical improvement (was continued on flagyl for concomitant trichomonas/BV infection). CT abdomen/pelvis was repeated on 04/17/14 that revealed progression of diverticulitis involving the lower descending colon/upper sigmoid colon area with interval development of a 3.7 cm x 3.1 cm diverticular abscess. Per IR (Dr. Earleen Newport), the abscess was not amenable to drainage. Despite radiological evidence of worsening diverticulitis, she clinically improved with resolution of abdominal pain and tenderness. Her diet was advanced as tolerated and on day of discharge was tolerating soft diet with no difficulty. She was afebrile with no leukocytosis. Blood cultures were pending at time of discharge (finalized with no growth). She received IV fluids and pain control with fentanyl and oxycodone IR. She was followed by general surgery who felt that she was stable for discharge with outpatient repeat CT abdomen/pelvis imaging in 1 week and clinic follow-up in 2 weeks. She will need outpatient colonoscopy in 6-8 weeks after resolution of infection to exclude malignancy and IBD. She was seen by registered dietician  during hospitalization and was instructed to follow a low-fiber diet and slowly advance to high fiber diet after resolution of current infection. She was instructed to take ciprofloxacin 500 mg BID and flagyl 500 mg BID for 7 additional days for total of 14 day antibiotics per surgery recommendations. She was given short supply of percocet to take as needed for pain which was minimal on day of discharge. She is also to have follow-up with her PCP in 1 week.   Acute blood loss anemia - Pt with Hg range of 11.2-13.7 during hospitalization with no prior history of anemia. She reported possible bloody diarrhea a few weeks prior to admission. FOBT was negative. Pt with no active bleeding or hemodynamic instability during hospitalization. Pt to have close PCP follow-up to repeat CBC to ensure resolution.  Recurrent Bacterial Vaginosis and Trichomonas Vaginitis - Pt reported history of BV and trichomas in the past. Wet prep revealed clue cells and few trichomonas. Pt is s/p 7 days of treatment with flagyl 500 mg BID. Pt aware that partner needs treatment as well.   Recurrent Chlamydia Infection - Pt reported prior infection some years ago. Pt tested positive for chlamydia on GC/chlamydia probe on 04/13/14. She received 1 g of azithromycin on 04/14/14. HIV was negative. Pt aware that partner needs treatment as well.  Left Ovarian Cyst  - Pt with 4.3 cm x 3.5 cm cyst arising from the left ovary. Pt to have further evaluation per PCP.   Alcohol Abuse - Pt reported drinking 40 oz alcohol daily. She was placed on CIWA protocol and required total 4 mg of Ativan during hospitalization. She was counseled on cessation.    Tobacco Use - Pt reported smoking 0.7 pack a day for past 15 years. She received nicotine patch daily during hospitalization with no withdrawal symptoms. She was instructed to use nicotine 21 mg patch daily (given 1 month supply) and to have close PCP follow-up. She was counseled on cessation.    HIV Screening - Pt tested negative for HIV during hospitalization.   DVT Prophylaxis - Pt received SCD's and lovenox during hospitalization with no evidence of DVT.     Discharge Vitals:   BP 116/67 mmHg  Pulse 91  Temp(Src) 98.9 F (37.2 C) (Oral)  Resp 18  Ht 5\' 7"  (1.702 m)  Wt 247 lb (112.038 kg)  BMI 38.68 kg/m2  SpO2 100%  LMP 03/24/2014 (Approximate)  Discharge Labs:  Results for orders placed or performed during the hospital encounter of 04/13/14 (from the past 24 hour(s))  Basic metabolic panel     Status: None   Collection Time: 04/20/14  5:32 AM  Result Value Ref Range   Sodium 136 135 - 145 mmol/L   Potassium 3.8 3.5 - 5.1 mmol/L   Chloride 103 96 - 112 mmol/L   CO2 23 19 - 32 mmol/L   Glucose, Bld 98 70 - 99 mg/dL   BUN 7 6 - 23 mg/dL   Creatinine, Ser 0.67 0.50 - 1.10 mg/dL   Calcium 8.6 8.4 - 10.5 mg/dL   GFR calc non Af Amer >90 >90 mL/min   GFR calc Af Amer >90 >90 mL/min   Anion gap 10 5 - 15    Signed: Juluis Mire, MD 04/20/2014, 12:34 PM    Services Ordered on Discharge: None Equipment Ordered on Discharge: None

## 2014-04-20 NOTE — Progress Notes (Signed)
Patient c/o itching over entire body.  No difficulty breathing or rash.  MD called and received order for PO Benadryl.  Will administer to the patient.  Will continue to monitor patient.  Earleen Reaper RN-BC, [employer]

## 2014-04-21 LAB — CULTURE, BLOOD (ROUTINE X 2)
CULTURE: NO GROWTH
Culture: NO GROWTH

## 2014-04-21 MED ORDER — NICOTINE 21 MG/24HR TD PT24: 21.0000 mg | MEDICATED_PATCH | Freq: Every day | TRANSDERMAL | Status: DC

## 2014-04-21 NOTE — Progress Notes (Signed)
Patient ID: Tina Riggs, female   DOB: February 10, 1980, 34 y.o.   MRN: [id number] Medicine attending: I personally examined this patient on the day of discharge together with the medical resident team. I tested the accuracy of the discharge evaluation and plan as recorded by resident physician Dr. Juluis Mire.  Clinical summary: Pleasant 34 year old woman who has been in overall good health. She does admit to heavy beer drinking. She presented on the day of the current admission 04/13/2014 with a pressure-like epigastric pain which had progressed over the previous few weeks and was associated with intermittent emesis without hematemesis. She had abdominal bloating, flatulence, and diarrhea.  On initial exam by acting intern Ms. Jethro Poling, Overweight African-American woman in moderate distress Blood pressure 153/110, pulse 90 regular, temperature 100.5, respirations 21. Pertinent findings limited to the abdomen which was tender on palpation in the left lower and left upper quadrants. No rebound or guarding.  White blood count 12,700 with 75% neutrophils  CT scan of the abdomen and pelvis showed changes consistent with diverticulitis with inflammation in the area of the proximal sigmoid colon. No abscess was seen but a tiny focus of air was noted suggesting a microperforation. Appendix appeared normal. No bowel obstruction. Pancreas appeared normal.  Hospital course: She was seen in consultation by general surgery Dr. Vilinda Flake. Recommendation was bowel rest, nothing by mouth, IV antibiotics. She received initial doses of azithromycin and Rocephin. Patient responded nicely to conservative treatment with resolution of abdominal pain and fever. Maximum temperature recorded 100.9. She was then changed to ertapenem and Flagyl. A follow-up CT scan on April 15 did show formation of a small abscess 3.7 x 3.1 cm.  She is discharged in stable condition. She will follow-up with general surgery with  plan for a follow-up CT scan in one week and a diagnostic colonoscopy in about 6 weeks. Given her young age, we need to exclude possibility of inflammatory bowel disease. We discussed this with her. She will continue oral antibiotics with Cipro 500 mg twice daily and Flagyl 500 mg twice daily for an additional 7 days. There were no complications.

## 2014-04-27 ENCOUNTER — Ambulatory Visit
Admission: RE | Admit: 2014-04-27 | Discharge: 2014-04-27 | Disposition: A | Discharge status: Home / Self Care | Payer: 59 | Source: Ambulatory Visit | Attending: Surgery | Admitting: Surgery

## 2014-04-27 MED ORDER — IOPAMIDOL (ISOVUE-300) INJECTION 61%: 125.0000 mL | Freq: Once | INTRAVENOUS | Status: AC | PRN

## 2014-04-29 ENCOUNTER — Encounter (HOSPITAL_COMMUNITY): Payer: Self-pay | Admitting: Emergency Medicine

## 2014-04-29 ENCOUNTER — Inpatient Hospital Stay (HOSPITAL_COMMUNITY)
Admission: EM | Admit: 2014-04-29 | Discharge: 2014-05-05 | DRG: 392 | Disposition: A | Discharge status: Home / Self Care | Payer: 59 | Attending: Surgery | Admitting: Surgery

## 2014-04-29 DIAGNOSIS — R109 Unspecified abdominal pain: Secondary | ICD-10-CM | POA: Diagnosis present

## 2014-04-29 DIAGNOSIS — Z6839 Body mass index (BMI) 39.0-39.9, adult: Secondary | ICD-10-CM

## 2014-04-29 DIAGNOSIS — K5732 Diverticulitis of large intestine without perforation or abscess without bleeding: Secondary | ICD-10-CM | POA: Diagnosis present

## 2014-04-29 DIAGNOSIS — Z880 Allergy status to penicillin: Secondary | ICD-10-CM

## 2014-04-29 DIAGNOSIS — F17210 Nicotine dependence, cigarettes, uncomplicated: Secondary | ICD-10-CM | POA: Diagnosis present

## 2014-04-29 DIAGNOSIS — E669 Obesity, unspecified: Secondary | ICD-10-CM | POA: Diagnosis present

## 2014-04-29 DIAGNOSIS — K5792 Diverticulitis of intestine, part unspecified, without perforation or abscess without bleeding: Secondary | ICD-10-CM | POA: Diagnosis present

## 2014-04-29 LAB — CBC WITH DIFFERENTIAL/PLATELET
BASOS ABS: 0 10*3/uL (ref 0.0–0.1)
Basophils Relative: 0 % (ref 0–1)
EOS ABS: 0.1 10*3/uL (ref 0.0–0.7)
EOS PCT: 1 % (ref 0–5)
HEMATOCRIT: 35.3 % — AB (ref 36.0–46.0)
Hemoglobin: 11.5 g/dL — ABNORMAL LOW (ref 12.0–15.0)
Lymphocytes Relative: 22 % (ref 12–46)
Lymphs Abs: 2.2 10*3/uL (ref 0.7–4.0)
MCH: 30.2 pg (ref 26.0–34.0)
MCHC: 32.6 g/dL (ref 30.0–36.0)
MCV: 92.7 fL (ref 78.0–100.0)
Monocytes Absolute: 0.9 10*3/uL (ref 0.1–1.0)
Monocytes Relative: 9 % (ref 3–12)
NEUTROS PCT: 68 % (ref 43–77)
Neutro Abs: 6.7 10*3/uL (ref 1.7–7.7)
PLATELETS: 413 10*3/uL — AB (ref 150–400)
RBC: 3.81 MIL/uL — ABNORMAL LOW (ref 3.87–5.11)
RDW: 13.7 % (ref 11.5–15.5)
WBC: 10 10*3/uL (ref 4.0–10.5)

## 2014-04-29 LAB — URINALYSIS, ROUTINE W REFLEX MICROSCOPIC
BILIRUBIN URINE: NEGATIVE
Glucose, UA: NEGATIVE mg/dL
KETONES UR: NEGATIVE mg/dL
Leukocytes, UA: NEGATIVE
NITRITE: NEGATIVE
Protein, ur: NEGATIVE mg/dL
Specific Gravity, Urine: 1.037 — ABNORMAL HIGH (ref 1.005–1.030)
Urobilinogen, UA: 0.2 mg/dL (ref 0.0–1.0)
pH: 6 (ref 5.0–8.0)

## 2014-04-29 LAB — COMPREHENSIVE METABOLIC PANEL
ALT: 12 U/L (ref 0–35)
AST: 17 U/L (ref 0–37)
Albumin: 2.8 g/dL — ABNORMAL LOW (ref 3.5–5.2)
Alkaline Phosphatase: 51 U/L (ref 39–117)
Anion gap: 6 (ref 5–15)
BUN: 8 mg/dL (ref 6–23)
CALCIUM: 8.6 mg/dL (ref 8.4–10.5)
CO2: 24 mmol/L (ref 19–32)
Chloride: 110 mmol/L (ref 96–112)
Creatinine, Ser: 0.82 mg/dL (ref 0.50–1.10)
GFR calc Af Amer: >90 mL/min (ref >90)
GFR calc non Af Amer: >90 mL/min (ref >90)
Glucose, Bld: 105 mg/dL — ABNORMAL HIGH (ref 70–99)
Potassium: 3.7 mmol/L (ref 3.5–5.1)
SODIUM: 140 mmol/L (ref 135–145)
TOTAL PROTEIN: 5.9 g/dL — AB (ref 6.0–8.3)
Total Bilirubin: 0.2 mg/dL — ABNORMAL LOW (ref 0.3–1.2)

## 2014-04-29 LAB — URINE MICROSCOPIC-ADD ON

## 2014-04-29 LAB — LIPASE, BLOOD: Lipase: 18 U/L (ref 11–59)

## 2014-04-29 MED ORDER — DIPHENHYDRAMINE HCL 12.5 MG/5ML PO ELIX: 12.5000 mg | ORAL_SOLUTION | Freq: Four times a day (QID) | ORAL | Status: DC | PRN

## 2014-04-29 MED ORDER — NICOTINE 21 MG/24HR TD PT24
21.0000 mg | MEDICATED_PATCH | Freq: Every day | TRANSDERMAL | Status: DC
  Administered 2014-04-29 – 2014-05-05 (×7): 21 mg via TRANSDERMAL
  Filled 2014-04-29 (×7): qty 1

## 2014-04-29 MED ORDER — ENOXAPARIN SODIUM 40 MG/0.4ML ~~LOC~~ SOLN
40.0000 mg | SUBCUTANEOUS | Status: DC
Start: 2014-04-30 — End: 2014-05-05
  Administered 2014-04-30 – 2014-05-05 (×6): 40 mg via SUBCUTANEOUS
  Filled 2014-04-29 (×6): qty 0.4

## 2014-04-29 MED ORDER — KCL IN DEXTROSE-NACL 20-5-0.45 MEQ/L-%-% IV SOLN
INTRAVENOUS | Status: DC
  Administered 2014-04-29 – 2014-05-03 (×6): via INTRAVENOUS
  Filled 2014-04-29 (×10): qty 1000

## 2014-04-29 MED ORDER — ACETAMINOPHEN 325 MG PO TABS: 650.0000 mg | ORAL_TABLET | Freq: Four times a day (QID) | ORAL | Status: DC | PRN

## 2014-04-29 MED ORDER — ACETAMINOPHEN 650 MG RE SUPP: 650.0000 mg | Freq: Four times a day (QID) | RECTAL | Status: DC | PRN

## 2014-04-29 MED ORDER — SODIUM CHLORIDE 0.9 % IV SOLN
1.0000 g | INTRAVENOUS | Status: AC
  Administered 2014-04-29 – 2014-05-03 (×5): 1 g via INTRAVENOUS
  Filled 2014-04-29 (×5): qty 1

## 2014-04-29 MED ORDER — DIPHENHYDRAMINE HCL 50 MG/ML IJ SOLN
12.5000 mg | Freq: Four times a day (QID) | INTRAMUSCULAR | Status: DC | PRN
  Administered 2014-05-01 – 2014-05-05 (×4): 25 mg via INTRAVENOUS
  Filled 2014-04-29 (×5): qty 1

## 2014-04-29 MED ORDER — MORPHINE SULFATE 2 MG/ML IJ SOLN
1.0000 mg | INTRAMUSCULAR | Status: DC | PRN
  Administered 2014-04-29: 4 mg via INTRAVENOUS
  Administered 2014-04-29 – 2014-04-30 (×4): 2 mg via INTRAVENOUS
  Administered 2014-04-30 (×2): 4 mg via INTRAVENOUS
  Administered 2014-04-30: 2 mg via INTRAVENOUS
  Administered 2014-05-01 (×4): 4 mg via INTRAVENOUS
  Administered 2014-05-02 (×2): 2 mg via INTRAVENOUS
  Administered 2014-05-02 – 2014-05-03 (×3): 4 mg via INTRAVENOUS
  Administered 2014-05-03 – 2014-05-04 (×2): 2 mg via INTRAVENOUS
  Filled 2014-04-29: qty 1
  Filled 2014-04-29: qty 2
  Filled 2014-04-29 (×2): qty 1
  Filled 2014-04-29 (×2): qty 2
  Filled 2014-04-29: qty 1
  Filled 2014-04-29: qty 2
  Filled 2014-04-29 (×3): qty 1
  Filled 2014-04-29 (×4): qty 2
  Filled 2014-04-29: qty 1
  Filled 2014-04-29: qty 2
  Filled 2014-04-29: qty 1
  Filled 2014-04-29: qty 2

## 2014-04-29 MED ORDER — ONDANSETRON HCL 4 MG/2ML IJ SOLN
4.0000 mg | Freq: Four times a day (QID) | INTRAMUSCULAR | Status: DC | PRN
  Administered 2014-04-29 – 2014-05-01 (×2): 4 mg via INTRAVENOUS
  Filled 2014-04-29 (×2): qty 2

## 2014-04-29 NOTE — ED Notes (Signed)
Spoke with [facility] Nurse Triage. Made aware pt is here. Reports they will page Claiborne Billings, [place] to this RN.

## 2014-04-29 NOTE — ED Notes (Signed)
Pt c/o lower abd pain from diverticulitis; pt with recent hx of same and sent here for eval by [facility]

## 2014-04-29 NOTE — H&P (Signed)
Tina Riggs 17-Jun-1980  [id number].   Primary Care MD: Dr. Gracy Racer Chief Complaint/Reason for Consult: abdominal pain, diverticulitis HPI: This is a 34 yo obese black female who was admitted from 04-13-14 to  04-20-14 for diverticulitis with a contained perforation of diverticulitis with a phlegmon.  The patient was initially treated with Rocephin and Flagyl, but switched to Invanz due to persistent pain and low grade fevers.  She improved on this and eventually went home on Cipro/Flagyl as she is PCN allergic.  She started feeling worse at home and began having more pain.  She describes this as "shocky" crampy pain.  Her biggest complaint is she doesn't know what to eat or drink, she can't lay on her stomach, and she is uncomfortable at night.  She denies any fevers.  She had a repeat CT scan on Monday that showed improvement as her extraluminal gas had resolved as well as her phlegmon, but her diverticulitis persisted.  She continued to complain of pain and called her PCP office today.  She was instructed to come to [facility] for our evaluation. she is eating well at home.  She is having some diarrhea, but not much. After a long discussion with the patient, she will be admitted for IV abx therapy.  ROS: Please see HPI, otherwise all other systems are negative.  History reviewed. No pertinent family history.  Past Medical History  Diagnosis Date  . HPV in female   . Diverticulitis     hospitalized 04/13/2014    Past Surgical History  Procedure Laterality Date  . Cesarean section  02/2006  . Colposcopy  Sep 2012    Social History:  reports that she has been smoking Cigarettes.  She has a 11.25 pack-year smoking history. She has never used smokeless tobacco. She reports that she drinks about 36.0 oz of alcohol per week. She reports that she does not use illicit drugs.  Allergies:  Allergies  Allergen Reactions  . Penicillins Itching and Rash     (Not in a hospital  admission)  Blood pressure 141/91, pulse 75, temperature 98.4 F (36.9 C), resp. rate 16, last menstrual period 04/27/2014, SpO2 98 %. Physical Exam: General: morbidly obese black female who is laying in bed in NAD HEENT: head is normocephalic, atraumatic.  Sclera are noninjected.  PERRL.  Ears and nose without any masses or lesions.  Mouth is pink and moist Heart: regular, rate, and rhythm.  Normal s1,s2. No obvious murmurs, gallops, or rubs noted.  Palpable radial and pedal pulses bilaterally Lungs: CTAB, no wheezes, rhonchi, or rales noted.  Respiratory effort nonlabored Abd: soft, mildly tender in the LLQ, but non guarding, rebounding, or peritoneal signs, ND, +BS, no masses, hernias, or organomegaly MS: all 4 extremities are symmetrical with no cyanosis, clubbing, or edema. Skin: warm and dry with no masses, lesions, or rashes Psych: A&Ox3 with an appropriate affect.    Results for orders placed or performed during the hospital encounter of 04/29/14 (from the past 48 hour(s))  CBC with Differential     Status: Abnormal   Collection Time: 04/29/14  2:03 PM  Result Value Ref Range   WBC 10.0 4.0 - 10.5 K/uL   RBC 3.81 (L) 3.87 - 5.11 MIL/uL   Hemoglobin 11.5 (L) 12.0 - 15.0 g/dL   HCT 35.3 (L) 36.0 - 46.0 %   MCV 92.7 78.0 - 100.0 fL   MCH 30.2 26.0 - 34.0 pg   MCHC 32.6 30.0 - 36.0 g/dL   RDW  13.7 11.5 - 15.5 %   Platelets 413 (H) 150 - 400 K/uL   Neutrophils Relative % 68 43 - 77 %   Neutro Abs 6.7 1.7 - 7.7 K/uL   Lymphocytes Relative 22 12 - 46 %   Lymphs Abs 2.2 0.7 - 4.0 K/uL   Monocytes Relative 9 3 - 12 %   Monocytes Absolute 0.9 0.1 - 1.0 K/uL   Eosinophils Relative 1 0 - 5 %   Eosinophils Absolute 0.1 0.0 - 0.7 K/uL   Basophils Relative 0 0 - 1 %   Basophils Absolute 0.0 0.0 - 0.1 K/uL  Comprehensive metabolic panel     Status: Abnormal   Collection Time: 04/29/14  2:03 PM  Result Value Ref Range   Sodium 140 135 - 145 mmol/L   Potassium 3.7 3.5 - 5.1 mmol/L    Chloride 110 96 - 112 mmol/L   CO2 24 19 - 32 mmol/L   Glucose, Bld 105 (H) 70 - 99 mg/dL   BUN 8 6 - 23 mg/dL   Creatinine, Ser 0.82 0.50 - 1.10 mg/dL   Calcium 8.6 8.4 - 10.5 mg/dL   Total Protein 5.9 (L) 6.0 - 8.3 g/dL   Albumin 2.8 (L) 3.5 - 5.2 g/dL   AST 17 0 - 37 U/L   ALT 12 0 - 35 U/L   Alkaline Phosphatase 51 39 - 117 U/L   Total Bilirubin 0.2 (L) 0.3 - 1.2 mg/dL   GFR calc non Af Amer >90 >90 mL/min   GFR calc Af Amer >90 >90 mL/min    Comment: (NOTE) The eGFR has been calculated using the CKD EPI equation. This calculation has not been validated in all clinical situations. eGFR's persistently <90 mL/min signify possible Chronic Kidney Disease.    Anion gap 6 5 - 15  Lipase, blood     Status: None   Collection Time: 04/29/14  2:03 PM  Result Value Ref Range   Lipase 18 11 - 59 U/L   No results found.     Assessment/Plan 1. Refractory diverticulitis  The patient has come back due to persistent pain.  She finished her C/F on Monday.  She denies any fevers; however, her pain persists.  Her CT scan is reassuring that her phlegmon and perforation are improved, but her inflammatory changes persists.  Her physical exam is not very impressive, but she continues to complain of persistent symptoms that are not improving.  I have offered her admission for IV abx therapy.  I have discussed with the patient that if she continues to fail medical management that she is at risk for operative intervention which may include a colostomy.  We have had an extensive discussion about all of the above.  She will be NPO x ice at least for tonight.  I will restart her on Invanz, as she responded well to this last admission.  IVFs, pain meds will be given prn.  Milinda Sweeney E 04/29/2014, 4:02 PM Pager: [phone]

## 2014-04-29 NOTE — ED Notes (Signed)
Spoke with Claiborne Billings, [place]. Is aware that patient is here. Appropriate labs have been ordered and are being processed.

## 2014-04-30 LAB — CBC
HCT: 33.9 % — ABNORMAL LOW (ref 36.0–46.0)
HEMOGLOBIN: 10.7 g/dL — AB (ref 12.0–15.0)
MCH: 29.6 pg (ref 26.0–34.0)
MCHC: 31.6 g/dL (ref 30.0–36.0)
MCV: 93.6 fL (ref 78.0–100.0)
Platelets: 404 10*3/uL — ABNORMAL HIGH (ref 150–400)
RBC: 3.62 MIL/uL — ABNORMAL LOW (ref 3.87–5.11)
RDW: 13.9 % (ref 11.5–15.5)
WBC: 8 10*3/uL (ref 4.0–10.5)

## 2014-04-30 LAB — BASIC METABOLIC PANEL
ANION GAP: 7 (ref 5–15)
BUN: <5 mg/dL — ABNORMAL LOW (ref 6–23)
CALCIUM: 8.4 mg/dL (ref 8.4–10.5)
CO2: 26 mmol/L (ref 19–32)
Chloride: 107 mmol/L (ref 96–112)
Creatinine, Ser: 0.76 mg/dL (ref 0.50–1.10)
GFR calc Af Amer: >90 mL/min (ref >90)
GFR calc non Af Amer: >90 mL/min (ref >90)
GLUCOSE: 94 mg/dL (ref 70–99)
POTASSIUM: 4.3 mmol/L (ref 3.5–5.1)
SODIUM: 140 mmol/L (ref 135–145)

## 2014-04-30 NOTE — Care Management (Signed)
04-30-14 Consult from admission nurse : no money for RX .  Patient has insurance unable to assist with co pays . Magdalen Spatz RN BSN

## 2014-04-30 NOTE — Progress Notes (Signed)
Patient ID: Tina Riggs, female   DOB: [birth year], 34 y.o.   MRN: [id number]    Subjective: Feeling better on the IV abx therapy.  No nausea.  Hungry.  No BM  Objective: Vital signs in last 24 hours: Temp:  [97.9 F (36.6 C)-98.8 F (37.1 C)] 97.9 F (36.6 C) (04/28 0602) Pulse Rate:  [69-79] 79 (04/28 0602) Resp:  [16-18] 17 (04/28 0602) BP: (116-141)/(70-91) 121/73 mmHg (04/28 0602) SpO2:  [98 %-100 %] 100 % (04/28 0602) Weight:  [114.443 kg (252 lb 4.8 oz)] 114.443 kg (252 lb 4.8 oz) (04/27 2012) Last BM Date: 04/29/14  Intake/Output from previous day:   Intake/Output this shift:    PE: Abd: soft, only tender to deep palpation in LLQ, +BS, appears ND, but pt c/o bloating Heart: regular Lungs: CTAB  Lab Results:   Recent Labs  04/29/14 1403 04/30/14 0408  WBC 10.0 8.0  HGB 11.5* 10.7*  HCT 35.3* 33.9*  PLT 413* 404*   BMET  Recent Labs  04/29/14 1403 04/30/14 0408  NA 140 140  K 3.7 4.3  CL 110 107  CO2 24 26  GLUCOSE 105* 94  BUN 8 <5*  CREATININE 0.82 0.76  CALCIUM 8.6 8.4   PT/INR No results for input(s): LABPROT, INR in the last 72 hours. CMP     Component Value Date/Time   NA 140 04/30/2014 0408   K 4.3 04/30/2014 0408   CL 107 04/30/2014 0408   CO2 26 04/30/2014 0408   GLUCOSE 94 04/30/2014 0408   BUN <5* 04/30/2014 0408   CREATININE 0.76 04/30/2014 0408   CALCIUM 8.4 04/30/2014 0408   PROT 5.9* 04/29/2014 1403   ALBUMIN 2.8* 04/29/2014 1403   AST 17 04/29/2014 1403   ALT 12 04/29/2014 1403   ALKPHOS 51 04/29/2014 1403   BILITOT 0.2* 04/29/2014 1403   GFRNONAA >90 04/30/2014 0408   GFRAA >90 04/30/2014 0408   Lipase     Component Value Date/Time   LIPASE 18 04/29/2014 1403       Studies/Results: No results found.  Anti-infectives: Anti-infectives    Start     Dose/Rate Route Frequency Ordered Stop   04/29/14 2000  ertapenem (INVANZ) 1 g in sodium chloride 0.9 % 50 mL IVPB     1 g 100 mL/hr over 30 Minutes  Intravenous Every 24 hours 04/29/14 1928         Assessment/Plan  1. Persistent diverticulitis, phlegmon resolved -cont Invanz today.  Hopefully we can get her better with this, transition to orals, and then avoid surgical intervention acutely. -if she fails medical management, she will need an operation -give clear liquids today and follow. -mobilize and pulm toilet   LOS: 1 day    Jady Braggs E 04/30/2014, 10:04 AM Pager: [phone]

## 2014-05-01 ENCOUNTER — Encounter (HOSPITAL_COMMUNITY): Payer: Self-pay | Admitting: *Deleted

## 2014-05-01 LAB — CBC
HEMATOCRIT: 34.5 % — AB (ref 36.0–46.0)
HEMOGLOBIN: 11.4 g/dL — AB (ref 12.0–15.0)
MCH: 30.4 pg (ref 26.0–34.0)
MCHC: 33 g/dL (ref 30.0–36.0)
MCV: 92 fL (ref 78.0–100.0)
Platelets: 403 10*3/uL — ABNORMAL HIGH (ref 150–400)
RBC: 3.75 MIL/uL — AB (ref 3.87–5.11)
RDW: 13.4 % (ref 11.5–15.5)
WBC: 6.7 10*3/uL (ref 4.0–10.5)

## 2014-05-01 MED ORDER — CHLORHEXIDINE GLUCONATE 0.12 % MT SOLN
15.0000 mL | Freq: Two times a day (BID) | OROMUCOSAL | Status: DC
  Administered 2014-05-01 – 2014-05-04 (×8): 15 mL via OROMUCOSAL
  Filled 2014-05-01 (×7): qty 15

## 2014-05-01 NOTE — Progress Notes (Signed)
Patient ID: Tina Riggs, female   DOB: June 27, 1980, 34 y.o.   MRN: [id number]    Subjective: Worsening pain since yesterday with starting the clear liquids.  Is having to use 4mg  of morphine now instead of just 2mg .  Feels more bloated  Objective: Vital signs in last 24 hours: Temp:  [98.1 F (36.7 C)-99 F (37.2 C)] 98.5 F (36.9 C) (04/29 0514) Pulse Rate:  [64-89] 68 (04/29 0514) Resp:  [18-20] 18 (04/29 0514) BP: (130-142)/(81-94) 131/81 mmHg (04/29 0514) SpO2:  [96 %-99 %] 97 % (04/29 0514) Last BM Date: 04/29/14  Intake/Output from previous day: 04/28 0701 - 04/29 0700 In: 3673 [P.O.:420; I.V.:3253] Out: -  Intake/Output this shift:    PE: Abd: soft, more tender in LUQ and LLQ today than yesterday, some BS, seems a little bloated Heart: regular Lungs: CTAB  Lab Results:   Recent Labs  04/29/14 1403 04/30/14 0408  WBC 10.0 8.0  HGB 11.5* 10.7*  HCT 35.3* 33.9*  PLT 413* 404*   BMET  Recent Labs  04/29/14 1403 04/30/14 0408  NA 140 140  K 3.7 4.3  CL 110 107  CO2 24 26  GLUCOSE 105* 94  BUN 8 <5*  CREATININE 0.82 0.76  CALCIUM 8.6 8.4   PT/INR No results for input(s): LABPROT, INR in the last 72 hours. CMP     Component Value Date/Time   NA 140 04/30/2014 0408   K 4.3 04/30/2014 0408   CL 107 04/30/2014 0408   CO2 26 04/30/2014 0408   GLUCOSE 94 04/30/2014 0408   BUN <5* 04/30/2014 0408   CREATININE 0.76 04/30/2014 0408   CALCIUM 8.4 04/30/2014 0408   PROT 5.9* 04/29/2014 1403   ALBUMIN 2.8* 04/29/2014 1403   AST 17 04/29/2014 1403   ALT 12 04/29/2014 1403   ALKPHOS 51 04/29/2014 1403   BILITOT 0.2* 04/29/2014 1403   GFRNONAA >90 04/30/2014 0408   GFRAA >90 04/30/2014 0408   Lipase     Component Value Date/Time   LIPASE 18 04/29/2014 1403       Studies/Results: No results found.  Anti-infectives: Anti-infectives    Start     Dose/Rate Route Frequency Ordered Stop   04/29/14 2000  ertapenem (INVANZ) 1 g in sodium chloride  0.9 % 50 mL IVPB     1 g 100 mL/hr over 30 Minutes Intravenous Every 24 hours 04/29/14 1928         Assessment/Plan   1. HD2, Persistent diverticulitis, phlegmon resolved -cont Invanz today. her pain is worse later yesterday and today.  Will check a CBC today.  Her last scan was on Monday.  If her pain continues to worsen, despite being on IV Invanz, may need repeat CT scan to re-evaluate.  She has been made NPO x ice -if she fails medical management, she will need an operation -mobilize and pulm toilet  DVT prophy -Lovenox/SCDs  LOS: 2 days    Tina Riggs E 05/01/2014, 9:27 AM Pager: [phone]

## 2014-05-02 NOTE — Progress Notes (Signed)
Patient ID: Tina Riggs, female   DOB: 1980/08/22, 34 y.o.   MRN: [id number]   LOS: 3 days   Subjective: Feeling much better today. Denies N/V, had BM. Pain improved. Hungry.   Objective: Vital signs in last 24 hours: Temp:  [98.5 F (36.9 C)-98.9 F (37.2 C)] 98.7 F (37.1 C) (04/30 0614) Pulse Rate:  [56-64] 56 (04/30 0614) Resp:  [16-18] 16 (04/30 0614) BP: (118-130)/(78-86) 118/78 mmHg (04/30 0614) SpO2:  [100 %] 100 % (04/30 0614) Last BM Date: 04/29/14   Physical Exam General appearance: alert and no distress Resp: clear to auscultation bilaterally Cardio: regular rate and rhythm GI: Soft, NT, +BS   Assessment/Plan: Persistent diverticulitis, phlegmon resolved -cont Invanz today. Give clears. -if she fails medical management, she will need an operation -mobilize and pulm toilet     Lisette Abu, PA-C Pager: [phone] 05/02/2014

## 2014-05-03 MED ORDER — METRONIDAZOLE 500 MG PO TABS
500.0000 mg | ORAL_TABLET | Freq: Three times a day (TID) | ORAL | Status: DC
  Administered 2014-05-03 – 2014-05-05 (×6): 500 mg via ORAL
  Filled 2014-05-03 (×6): qty 1

## 2014-05-03 MED ORDER — CIPROFLOXACIN 500 MG/5ML (10%) PO SUSR
500.0000 mg | Freq: Two times a day (BID) | ORAL | Status: DC
  Administered 2014-05-03 – 2014-05-04 (×3): 500 mg via ORAL
  Filled 2014-05-03 (×5): qty 5

## 2014-05-03 NOTE — Progress Notes (Signed)
Patient ID: Tina Riggs, female   DOB: Jan 25, 1980, 34 y.o.   MRN: [id number] [facility] Surgery Progress Note:   * No surgery found *  Subjective: Mental status is sleeping soundly and awakened without difficulty-alert.  Abdominal pain is much better Objective: Vital signs in last 24 hours: Temp:  [98.4 F (36.9 C)-98.8 F (37.1 C)] 98.4 F (36.9 C) (05/01 0549) Pulse Rate:  [47-61] 58 (05/01 0549) Resp:  [16-17] 17 (05/01 0549) BP: (115-128)/(70-87) 115/70 mmHg (05/01 0549) SpO2:  [100 %] 100 % (05/01 0549)  Intake/Output from previous day: 04/30 0701 - 05/01 0700 In: 2625.4 [P.O.:820; I.V.:1805.4] Out: -  Intake/Output this shift:    Physical Exam: Work of breathing is not labored.  Abdominal pain is much diminished  Lab Results:  Results for orders placed or performed during the hospital encounter of 04/29/14 (from the past 48 hour(s))  CBC     Status: Abnormal   Collection Time: 05/01/14 11:20 AM  Result Value Ref Range   WBC 6.7 4.0 - 10.5 K/uL   RBC 3.75 (L) 3.87 - 5.11 MIL/uL   Hemoglobin 11.4 (L) 12.0 - 15.0 g/dL   HCT 34.5 (L) 36.0 - 46.0 %   MCV 92.0 78.0 - 100.0 fL   MCH 30.4 26.0 - 34.0 pg   MCHC 33.0 30.0 - 36.0 g/dL   RDW 13.4 11.5 - 15.5 %   Platelets 403 (H) 150 - 400 K/uL    Radiology/Results: No results found.  Anti-infectives: Anti-infectives    Start     Dose/Rate Route Frequency Ordered Stop   05/03/14 1400  metroNIDAZOLE (FLAGYL) tablet 500 mg     500 mg Oral 3 times per day 05/03/14 1056     05/03/14 1100  ciprofloxacin (CIPRO) 500 MG/5ML (10%) suspension 500 mg     500 mg Oral 2 times daily 05/03/14 1056     04/29/14 2000  ertapenem (INVANZ) 1 g in sodium chloride 0.9 % 50 mL IVPB     1 g 100 mL/hr over 30 Minutes Intravenous Every 24 hours 04/29/14 1928 05/03/14 2359      Assessment/Plan: Problem List: Patient Active Problem List   Diagnosis Date Noted  . Diverticulitis of colon 04/29/2014  . Diverticulitis 04/29/2014  .  Chlamydia 04/14/2014  . Trichomonal vaginitis   . Diverticulitis of colon with perforation 04/13/2014  . Bacterial vaginosis 04/13/2014  . Tobacco use 04/13/2014  . Alcohol use 04/13/2014    Improving diverticulitis.  Will begin switch to oral cipro flagyl and discontinue Invanz after tonight's dose.   * No surgery found *    LOS: 4 days   Matt B. Hassell Done, MD, [facility] [phone] beeper [phone]  05/03/2014 10:58 AM

## 2014-05-04 MED ORDER — WHITE PETROLATUM GEL
Status: AC
  Administered 2014-05-04: 08:00:00
  Filled 2014-05-04: qty 1

## 2014-05-04 MED ORDER — OXYCODONE-ACETAMINOPHEN 5-325 MG PO TABS
1.0000 | ORAL_TABLET | ORAL | Status: DC | PRN
  Administered 2014-05-04 – 2014-05-05 (×3): 2 via ORAL
  Filled 2014-05-04 (×3): qty 2

## 2014-05-04 MED ORDER — CIPROFLOXACIN HCL 500 MG PO TABS: 500.0000 mg | ORAL_TABLET | Freq: Two times a day (BID) | ORAL | Status: DC

## 2014-05-04 MED ORDER — WHITE PETROLATUM GEL
Status: AC
  Filled 2014-05-04: qty 1

## 2014-05-04 MED ORDER — CIPROFLOXACIN 500 MG/5ML (10%) PO SUSR
500.0000 mg | Freq: Two times a day (BID) | ORAL | Status: DC
  Administered 2014-05-04 – 2014-05-05 (×2): 500 mg via ORAL
  Filled 2014-05-04 (×7): qty 5

## 2014-05-04 NOTE — Progress Notes (Signed)
Patient ID: Tina Riggs, female   DOB: 1980-01-16, 34 y.o.   MRN: [id number]    Subjective: Pt feels better.  She is tolerating her full liquids with no issues.  Pain is much better.  Took morphine only twice yesterday  Objective: Vital signs in last 24 hours: Temp:  [98.4 F (36.9 C)-98.8 F (37.1 C)] 98.8 F (37.1 C) (05/02 0607) Pulse Rate:  [63-66] 63 (05/02 0607) Resp:  [16] 16 (05/02 0607) BP: (107-126)/(62-87) 107/62 mmHg (05/02 0607) SpO2:  [98 %-100 %] 98 % (05/02 0607) Last BM Date: 05/02/14  Intake/Output from previous day: 05/01 0701 - 05/02 0700 In: 1200 [I.V.:1200] Out: -  Intake/Output this shift:    PE: Abd: soft, seems NT, ND, +BS   Lab Results:   Recent Labs  05/01/14 1120  WBC 6.7  HGB 11.4*  HCT 34.5*  PLT 403*   BMET No results for input(s): NA, K, CL, CO2, GLUCOSE, BUN, CREATININE, CALCIUM in the last 72 hours. PT/INR No results for input(s): LABPROT, INR in the last 72 hours. CMP     Component Value Date/Time   NA 140 04/30/2014 0408   K 4.3 04/30/2014 0408   CL 107 04/30/2014 0408   CO2 26 04/30/2014 0408   GLUCOSE 94 04/30/2014 0408   BUN <5* 04/30/2014 0408   CREATININE 0.76 04/30/2014 0408   CALCIUM 8.4 04/30/2014 0408   PROT 5.9* 04/29/2014 1403   ALBUMIN 2.8* 04/29/2014 1403   AST 17 04/29/2014 1403   ALT 12 04/29/2014 1403   ALKPHOS 51 04/29/2014 1403   BILITOT 0.2* 04/29/2014 1403   GFRNONAA >90 04/30/2014 0408   GFRAA >90 04/30/2014 0408   Lipase     Component Value Date/Time   LIPASE 18 04/29/2014 1403       Studies/Results: No results found.  Anti-infectives: Anti-infectives    Start     Dose/Rate Route Frequency Ordered Stop   05/04/14 0945  ciprofloxacin (CIPRO) tablet 500 mg  Status:  Discontinued     500 mg Oral 2 times daily 05/04/14 0930 05/04/14 0932   05/04/14 0945  ciprofloxacin (CIPRO) 500 MG/5ML (10%) suspension 500 mg     500 mg Oral 2 times daily 05/04/14 0932     05/03/14 1400   metroNIDAZOLE (FLAGYL) tablet 500 mg     500 mg Oral 3 times per day 05/03/14 1056     05/03/14 1200  ciprofloxacin (CIPRO) 500 MG/5ML (10%) suspension 500 mg  Status:  Discontinued     500 mg Oral 2 times daily 05/03/14 1056 05/04/14 0930   04/29/14 2000  ertapenem (INVANZ) 1 g in sodium chloride 0.9 % 50 mL IVPB     1 g 100 mL/hr over 30 Minutes Intravenous Every 24 hours 04/29/14 1928 05/03/14 2044       Assessment/Plan  HD 5, diverticulitis with resolved phlegmon -cont on cipro/flagyl today -advance to low fiber diet -convert to po pain meds -hopefully can dc home tomorrow with follow up with Dr. Brantley Stage as an outpatient.   LOS: 5 days    Herron Fero E 05/04/2014, 9:35 AM Pager: [phone]

## 2014-05-05 MED ORDER — METRONIDAZOLE 500 MG PO TABS: 500.0000 mg | ORAL_TABLET | Freq: Three times a day (TID) | ORAL | Status: DC

## 2014-05-05 MED ORDER — CIPROFLOXACIN 500 MG/5ML (10%) PO SUSR: 500.0000 mg | Freq: Two times a day (BID) | ORAL | Status: AC

## 2014-05-05 MED ORDER — OXYCODONE-ACETAMINOPHEN 5-325 MG PO TABS: 1.0000 | ORAL_TABLET | ORAL | Status: DC | PRN

## 2014-05-05 NOTE — Discharge Summary (Signed)
Patient ID: YNEZ EUGENIO MRN: [id number] DOB/AGE: Apr 02, 1980 34 y.o.  Admit date: 04/29/2014 Discharge date: 05/05/2014  Procedures: none  Consults: None  Reason for Admission: This is a 34 yo obese black female who was admitted from 04-13-14 to 04-20-14 for diverticulitis with a contained perforation of diverticulitis with a phlegmon. The patient was initially treated with Rocephin and Flagyl, but switched to Invanz due to persistent pain and low grade fevers. She improved on this and eventually went home on Cipro/Flagyl as she is PCN allergic.   She started feeling worse at home and began having more pain. She describes this as "shocky" crampy pain. Her biggest complaint is she doesn't know what to eat or drink, she can't lay on her stomach, and she is uncomfortable at night. She denies any fevers. She had a repeat CT scan on Monday that showed improvement as her extraluminal gas had resolved as well as her phlegmon, but her diverticulitis persisted. She continued to complain of pain and called her PCP office today. She was instructed to come to [facility] for our evaluation. she is eating well at home. She is having some diarrhea, but not much. After a long discussion with the patient, she will be admitted for IV abx therapy.  Admission Diagnoses:  1. Persistent diverticulitis with resolved phlegmon  Hospital Course: The patient was admitted and replaced on IV Invanz.  This was continued for multiple days until the patient was truly pain-free.  On HD 1, she was placed on clear liquids, but her pain worsened with this.  She was returned to NPO status for bowel rest the following day upon evaluation.  Her WBC remained normal during this time with no fevers.  On HD 3, she was significantly better and clears were reinitiated.  She tolerated these well with no issues.  Her diet was able to be advanced over the next several days with no issues.  She was converted on HD 4 to oral cipro and flagyl.  Her  pain did not increase with this change.  By HD 6, the patient was stable for dc home.  Plans are for outpatient colonoscopy and elective resection.    She will follow up with Dr. Brantley Stage.  PE: Abd: soft, NT, Nd, +BS Heart: regular  Discharge Diagnoses:  Principal Problem:   Diverticulitis of colon Active Problems:   Diverticulitis   Discharge Medications:   Medication List    STOP taking these medications        ciprofloxacin 500 MG tablet  Commonly known as:  CIPRO      TAKE these medications        ciprofloxacin 500 MG/5ML (10%) suspension  Commonly known as:  CIPRO  Take 5 mLs (500 mg total) by mouth 2 (two) times daily.     etonogestrel 68 MG Impl implant  Commonly known as:  NEXPLANON  1 each by Subdermal route once. 2013     ibuprofen 200 MG tablet  Commonly known as:  ADVIL,MOTRIN  Take 600 mg by mouth every 6 (six) hours as needed for mild pain.     metroNIDAZOLE 500 MG tablet  Commonly known as:  FLAGYL  Take 1 tablet (500 mg total) by mouth 3 (three) times daily.     naproxen sodium 220 MG tablet  Commonly known as:  ANAPROX  Take 440 mg by mouth daily as needed (pain).     nicotine 21 mg/24hr patch  Commonly known as:  NICODERM CQ - dosed in mg/24 hours  Place 1 patch (21 mg total) onto the skin daily.     oxyCODONE-acetaminophen 5-325 MG per tablet  Commonly known as:  PERCOCET/ROXICET  Take 1-2 tablets by mouth every 4 (four) hours as needed for moderate pain.        Discharge Instructions:     Follow-up Information    Follow up with CORNETT,THOMAS A., MD. Schedule an appointment as soon as possible for a visit in 2 weeks.   Specialty:  General Surgery   Contact information:   [address] [place] [address] [phone]       Signed: Henreitta Cea 05/05/2014, 10:28 AM   Agree with above. Looks good.  Ready to go home.  Alphonsa Overall, MD, [facility] Pager: [phone] Office phone:   [phone]

## 2014-05-05 NOTE — Progress Notes (Signed)
Tina Riggs to be D/C'd Home per MD order.  Discussed with the patient and all questions fully answered.  VSS, Skin clean, dry and intact without evidence of skin break down, no evidence of skin tears noted. IV catheter discontinued intact. Site without signs and symptoms of complications. Dressing and pressure applied.  An After Visit Summary was printed and given to the patient. Patient received prescriptions. Work note was faxed to patient's employer per her request.  She was also given a copy.  D/c education completed with patient/family including follow up instructions, medication list, d/c activities limitations if indicated, with other d/c instructions as indicated by MD - patient able to verbalize understanding, all questions fully answered.   Patient instructed to return to ED, call 911, or call MD for any changes in condition.   Patient escorted via [place], and D/C home via private auto.  Micki Riley 05/05/2014 10:40 AM

## 2014-05-05 NOTE — Discharge Instructions (Signed)
You will need a colonoscopy in 4-6 weeks  Diverticulitis Diverticulitis is inflammation or infection of small pouches in your colon that form when you have a condition called diverticulosis. The pouches in your colon are called diverticula. Your colon, or large intestine, is where water is absorbed and stool is formed. Complications of diverticulitis can include:  Bleeding.  Severe infection.  Severe pain.  Perforation of your colon.  Obstruction of your colon. CAUSES  Diverticulitis is caused by bacteria. Diverticulitis happens when stool becomes trapped in diverticula. This allows bacteria to grow in the diverticula, which can lead to inflammation and infection. RISK FACTORS People with diverticulosis are at risk for diverticulitis. Eating a diet that does not include enough fiber from fruits and vegetables may make diverticulitis more likely to develop. SYMPTOMS  Symptoms of diverticulitis may include:  Abdominal pain and tenderness. The pain is normally located on the left side of the abdomen, but may occur in other areas.  Fever and chills.  Bloating.  Cramping.  Nausea.  Vomiting.  Constipation.  Diarrhea.  Blood in your stool. DIAGNOSIS  Your health care provider will ask you about your medical history and do a physical exam. You may need to have tests done because many medical conditions can cause the same symptoms as diverticulitis. Tests may include:  Blood tests.  Urine tests.  Imaging tests of the abdomen, including X-rays and CT scans. When your condition is under control, your health care provider may recommend that you have a colonoscopy. A colonoscopy can show how severe your diverticula are and whether something else is causing your symptoms. TREATMENT  Most cases of diverticulitis are mild and can be treated at home. Treatment may include:  Taking over-the-counter pain medicines.  Following a clear liquid diet.  Taking antibiotic medicines by  mouth for 7-10 days. More severe cases may be treated at a hospital. Treatment may include:  Not eating or drinking.  Taking prescription pain medicine.  Receiving antibiotic medicines through an IV tube.  Receiving fluids and nutrition through an IV tube.  Surgery. HOME CARE INSTRUCTIONS   Follow your health care provider's instructions carefully.  Follow a full liquid diet or other diet as directed by your health care provider. After your symptoms improve, your health care provider may tell you to change your diet. He or she may recommend you eat a high-fiber diet. Fruits and vegetables are good sources of fiber. Fiber makes it easier to pass stool.  Take fiber supplements or probiotics as directed by your health care provider.  Only take medicines as directed by your health care provider.  Keep all your follow-up appointments. SEEK MEDICAL CARE IF:   Your pain does not improve.  You have a hard time eating food.  Your bowel movements do not return to normal. SEEK IMMEDIATE MEDICAL CARE IF:   Your pain becomes worse.  Your symptoms do not get better.  Your symptoms suddenly get worse.  You have a fever.  You have repeated vomiting.  You have bloody or black, tarry stools. MAKE SURE YOU:   Understand these instructions.  Will watch your condition.  Will get help right away if you are not doing well or get worse. Document Released: 09/28/2004 Document Revised: 12/24/2012 Document Reviewed: 11/13/2012 [facility] Patient Information 2015 [place], [place]. This information is not intended to replace advice given to you by your health care provider. Make sure you discuss any questions you have with your health care provider.  Low-Fiber Diet for 3  weeks, then switch to high fiber diet Fiber is found in fruits, vegetables, and whole grains. A low-fiber diet restricts fibrous foods that are not digested in the small intestine. A diet containing about 10-15 grams of fiber  per day is considered low fiber. Low-fiber diets may be used to:  Promote healing and rest the bowel during intestinal flare-ups.  Prevent blockage of a partially obstructed or narrowed gastrointestinal tract.  Reduce fecal weight and volume.  Slow the movement of feces. You may be on a low-fiber diet as a transitional diet following surgery, after an injury (trauma), or because of a short (acute) or lifelong (chronic) illness. Your health care provider will determine the length of time you need to stay on this diet.  WHAT DO I NEED TO KNOW ABOUT A LOW-FIBER DIET? Always check the fiber content on the packaging's Nutrition Facts label, especially on foods from the grains list. Ask your dietitian if you have questions about specific foods that are related to your condition, especially if the food is not listed below. In general, a low-fiber food will have less than 2 g of fiber. WHAT FOODS CAN I EAT? Grains All breads and crackers made with white flour. Sweet rolls, doughnuts, waffles, pancakes, [place] toast, bagels. Pretzels, Melba toast, zwieback. Well-cooked cereals, such as cornmeal, farina, or cream cereals. Dry cereals that do not contain whole grains, fruit, or nuts, such as refined corn, wheat, rice, and oat cereals. Potatoes prepared any way without skins, plain pastas and noodles, refined white rice. Use white flour for baking and making sauces. Use allowed list of grains for casseroles, dumplings, and puddings.  Vegetables Strained tomato and vegetable juices. Fresh lettuce, cucumber, spinach. Well-cooked (no skin or pulp) or canned vegetables, such as asparagus, bean sprouts, beets, carrots, green beans, mushrooms, potatoes, pumpkin, spinach, yellow squash, tomato sauce/puree, turnips, yams, and zucchini. Keep servings limited to  cup.  Fruits All fruit juices except prune juice. Cooked or canned fruits without skin and seeds, such as applesauce, apricots, cherries, fruit cocktail,  grapefruit, grapes, mandarin oranges, melons, peaches, pears, pineapple, and plums. Fresh fruits without skin, such as apricots, avocados, bananas, melons, pineapple, nectarines, and peaches. Keep servings limited to  cup or 1 piece.  Meat and Other Protein Sources Ground or well-cooked tender beef, ham, veal, lamb, pork, or poultry. Eggs, plain cheese. Fish, oysters, shrimp, lobster, and other seafood. Liver, organ meats. Smooth nut butters. Dairy All milk products and alternative dairy substitutes, such as soy, rice, almond, and coconut, not containing added whole nuts, seeds, or added fruit. Beverages Decaf coffee, fruit, and vegetable juices or smoothies (small amounts, with no pulp or skins, and with fruits from allowed list), sports drinks, herbal tea. Condiments Ketchup, mustard, vinegar, cream sauce, cheese sauce, cocoa powder. Spices in moderation, such as allspice, basil, bay leaves, celery powder or leaves, cinnamon, cumin powder, curry powder, ginger, mace, marjoram, onion or garlic powder, oregano, paprika, parsley flakes, ground pepper, rosemary, sage, savory, tarragon, thyme, and turmeric. Sweets and Desserts Plain cakes and cookies, pie made with allowed fruit, pudding, custard, cream pie. Gelatin, fruit, ice, sherbet, frozen ice pops. Ice cream, ice milk without nuts. Plain hard candy, honey, jelly, molasses, syrup, sugar, chocolate syrup, gumdrops, marshmallows. Limit overall sugar intake.  Fats and Oil Margarine, butter, cream, mayonnaise, salad oils, plain salad dressings made from allowed foods. Choose healthy fats such as olive oil, canola oil, and omega-3 fatty acids (such as found in salmon or tuna) when possible.  Other  Bouillon, broth, or cream soups made from allowed foods. Any strained soup. Casseroles or mixed dishes made with allowed foods. The items listed above may not be a complete list of recommended foods or beverages. Contact your dietitian for more options.    WHAT FOODS ARE NOT RECOMMENDED? Grains All whole wheat and whole grain breads and crackers. Multigrains, rye, bran seeds, nuts, or coconut. Cereals containing whole grains, multigrains, bran, coconut, nuts, raisins. Cooked or dry oatmeal, steel-cut oats. Coarse wheat cereals, granola. Cereals advertised as high fiber. Potato skins. Whole grain pasta, wild or brown rice. Popcorn. Coconut flour. Bran, buckwheat, corn bread, multigrains, rye, wheat germ.  Vegetables Fresh, cooked or canned vegetables, such as artichokes, asparagus, beet greens, broccoli, Brussels sprouts, cabbage, celery, cauliflower, corn, eggplant, kale, legumes or beans, okra, peas, and tomatoes. Avoid large servings of any vegetables, especially raw vegetables.  Fruits Fresh fruits, such as apples with or without skin, berries, cherries, figs, grapes, grapefruit, guavas, kiwis, mangoes, oranges, papayas, pears, persimmons, pineapple, and pomegranate. Prune juice and juices with pulp, stewed or dried prunes. Dried fruits, dates, raisins. Fruit seeds or skins. Avoid large servings of all fresh fruits. Meats and Other Protein Sources Tough, fibrous meats with gristle. Chunky nut butter. Cheese made with seeds, nuts, or other foods not recommended. Nuts, seeds, legumes (beans, including baked beans), dried peas, beans, lentils.  Dairy Yogurt or cheese that contains nuts, seeds, or added fruit.  Beverages Fruit juices with high pulp, prune juice. Caffeinated coffee and teas.  Condiments Coconut, maple syrup, pickles, olives. Sweets and Desserts Desserts, cookies, or candies that contain nuts or coconut, chunky peanut butter, dried fruits. Jams, preserves with seeds, marmalade. Large amounts of sugar and sweets. Any other dessert made with fruits from the not recommended list.  Other Soups made from vegetables that are not recommended or that contain other foods not recommended.  The items listed above may not be a complete list of  foods and beverages to avoid. Contact your dietitian for more information. Document Released: 06/10/2001 Document Revised: 12/24/2012 Document Reviewed: 11/11/2012 [facility] Patient Information 2015 [place], [place]. This information is not intended to replace advice given to you by your health care provider. Make sure you discuss any questions you have with your health care provider.  High-Fiber Diet Fiber is found in fruits, vegetables, and grains. A high-fiber diet encourages the addition of more whole grains, legumes, fruits, and vegetables in your diet. The recommended amount of fiber for adult males is 38 g per day. For adult females, it is 25 g per day. Pregnant and lactating women should get 28 g of fiber per day. If you have a digestive or bowel problem, ask your caregiver for advice before adding high-fiber foods to your diet. Eat a variety of high-fiber foods instead of only a select few type of foods.  PURPOSE  To increase stool bulk.  To make bowel movements more regular to prevent constipation.  To lower cholesterol.  To prevent overeating. WHEN IS THIS DIET USED?  It may be used if you have constipation and hemorrhoids.  It may be used if you have uncomplicated diverticulosis (intestine condition) and irritable bowel syndrome.  It may be used if you need help with weight management.  It may be used if you want to add it to your diet as a protective measure against atherosclerosis, diabetes, and cancer. SOURCES OF FIBER  Whole-grain breads and cereals.  Fruits, such as apples, oranges, bananas, berries, prunes, and pears.  Vegetables, such  as green peas, carrots, sweet potatoes, beets, broccoli, cabbage, spinach, and artichokes.  Legumes, such split peas, soy, lentils.  Almonds. FIBER CONTENT IN FOODS Starches and Grains / Dietary Fiber (g)  Cheerios, 1 cup / 3 g  Corn Flakes cereal, 1 cup / 0.7 g  Rice crispy treat cereal, 1 cup / 0.3 g  Instant oatmeal  (cooked),  cup / 2 g  Frosted wheat cereal, 1 cup / 5.1 g  Brown, long-grain rice (cooked), 1 cup / 3.5 g  White, long-grain rice (cooked), 1 cup / 0.6 g  Enriched macaroni (cooked), 1 cup / 2.5 g Legumes / Dietary Fiber (g)  Baked beans (canned, plain, or vegetarian),  cup / 5.2 g  Kidney beans (canned),  cup / 6.8 g  Pinto beans (cooked),  cup / 5.5 g Breads and Crackers / Dietary Fiber (g)  Plain or honey graham crackers, 2 squares / 0.7 g  Saltine crackers, 3 squares / 0.3 g  Plain, salted pretzels, 10 pieces / 1.8 g  Whole-wheat bread, 1 slice / 1.9 g  White bread, 1 slice / 0.7 g  Raisin bread, 1 slice / 1.2 g  Plain bagel, 3 oz / 2 g  Flour tortilla, 1 oz / 0.9 g  Corn tortilla, 1 small / 1.5 g  Hamburger or hotdog bun, 1 small / 0.9 g Fruits / Dietary Fiber (g)  Apple with skin, 1 medium / 4.4 g  Sweetened applesauce,  cup / 1.5 g  Banana,  medium / 1.5 g  Grapes, 10 grapes / 0.4 g  Orange, 1 small / 2.3 g  Raisin, 1.5 oz / 1.6 g  Melon, 1 cup / 1.4 g Vegetables / Dietary Fiber (g)  Green beans (canned),  cup / 1.3 g  Carrots (cooked),  cup / 2.3 g  Broccoli (cooked),  cup / 2.8 g  Peas (cooked),  cup / 4.4 g  Mashed potatoes,  cup / 1.6 g  Lettuce, 1 cup / 0.5 g  Corn (canned),  cup / 1.6 g  Tomato,  cup / 1.1 g Document Released: 12/19/2004 Document Revised: 06/20/2011 Document Reviewed: 03/23/2011 [employer] Patient Information 2015 [place], [place]. This information is not intended to replace advice given to you by your health care provider. Make sure you discuss any questions you have with your health care provider.

## 2014-05-13 ENCOUNTER — Encounter (HOSPITAL_COMMUNITY): Payer: Self-pay | Admitting: *Deleted

## 2014-05-13 ENCOUNTER — Other Ambulatory Visit: Payer: Self-pay | Admitting: General Surgery

## 2014-05-13 ENCOUNTER — Inpatient Hospital Stay (HOSPITAL_COMMUNITY)
Admission: EM | Admit: 2014-05-13 | Discharge: 2014-05-25 | DRG: 331 | Disposition: A | Discharge status: Home Health | Payer: 59 | Attending: General Surgery | Admitting: General Surgery

## 2014-05-13 DIAGNOSIS — K5732 Diverticulitis of large intestine without perforation or abscess without bleeding: Principal | ICD-10-CM | POA: Diagnosis present

## 2014-05-13 DIAGNOSIS — Z880 Allergy status to penicillin: Secondary | ICD-10-CM

## 2014-05-13 DIAGNOSIS — F17210 Nicotine dependence, cigarettes, uncomplicated: Secondary | ICD-10-CM | POA: Diagnosis present

## 2014-05-13 DIAGNOSIS — K5792 Diverticulitis of intestine, part unspecified, without perforation or abscess without bleeding: Secondary | ICD-10-CM | POA: Diagnosis present

## 2014-05-13 DIAGNOSIS — B373 Candidiasis of vulva and vagina: Secondary | ICD-10-CM | POA: Diagnosis present

## 2014-05-13 LAB — URINALYSIS, ROUTINE W REFLEX MICROSCOPIC
Glucose, UA: NEGATIVE mg/dL
HGB URINE DIPSTICK: NEGATIVE
Ketones, ur: NEGATIVE mg/dL
Leukocytes, UA: NEGATIVE
Nitrite: NEGATIVE
Protein, ur: NEGATIVE mg/dL
Specific Gravity, Urine: 1.036 — ABNORMAL HIGH (ref 1.005–1.030)
Urobilinogen, UA: 1 mg/dL (ref 0.0–1.0)
pH: 6.5 (ref 5.0–8.0)

## 2014-05-13 LAB — COMPREHENSIVE METABOLIC PANEL
ALBUMIN: 3 g/dL — AB (ref 3.5–5.0)
ALK PHOS: 54 U/L (ref 38–126)
ALT: 10 U/L — ABNORMAL LOW (ref 14–54)
AST: 14 U/L — AB (ref 15–41)
Anion gap: 10 (ref 5–15)
BUN: 8 mg/dL (ref 6–20)
CO2: 23 mmol/L (ref 22–32)
Calcium: 8.6 mg/dL — ABNORMAL LOW (ref 8.9–10.3)
Chloride: 108 mmol/L (ref 101–111)
Creatinine, Ser: 0.73 mg/dL (ref 0.44–1.00)
Glucose, Bld: 106 mg/dL — ABNORMAL HIGH (ref 70–99)
POTASSIUM: 4 mmol/L (ref 3.5–5.1)
Sodium: 141 mmol/L (ref 135–145)
TOTAL PROTEIN: 6.4 g/dL — AB (ref 6.5–8.1)
Total Bilirubin: 0.2 mg/dL — ABNORMAL LOW (ref 0.3–1.2)

## 2014-05-13 LAB — CBC WITH DIFFERENTIAL/PLATELET
BASOS ABS: 0 10*3/uL (ref 0.0–0.1)
BASOS PCT: 0 % (ref 0–1)
Eosinophils Absolute: 0.1 10*3/uL (ref 0.0–0.7)
Eosinophils Relative: 2 % (ref 0–5)
HCT: 37.2 % (ref 36.0–46.0)
HEMOGLOBIN: 12.1 g/dL (ref 12.0–15.0)
Lymphocytes Relative: 22 % (ref 12–46)
Lymphs Abs: 2 10*3/uL (ref 0.7–4.0)
MCH: 29.7 pg (ref 26.0–34.0)
MCHC: 32.5 g/dL (ref 30.0–36.0)
MCV: 91.4 fL (ref 78.0–100.0)
MONOS PCT: 7 % (ref 3–12)
Monocytes Absolute: 0.6 10*3/uL (ref 0.1–1.0)
Neutro Abs: 6.5 10*3/uL (ref 1.7–7.7)
Neutrophils Relative %: 69 % (ref 43–77)
Platelets: 334 10*3/uL (ref 150–400)
RBC: 4.07 MIL/uL (ref 3.87–5.11)
RDW: 14.1 % (ref 11.5–15.5)
WBC: 9.3 10*3/uL (ref 4.0–10.5)

## 2014-05-13 LAB — LIPASE, BLOOD: Lipase: 16 U/L — ABNORMAL LOW (ref 22–51)

## 2014-05-13 MED ORDER — ETONOGESTREL 68 MG ~~LOC~~ IMPL: 1.0000 | DRUG_IMPLANT | Freq: Once | SUBCUTANEOUS | Status: DC

## 2014-05-13 MED ORDER — DIPHENHYDRAMINE HCL 25 MG PO CAPS
50.0000 mg | ORAL_CAPSULE | Freq: Four times a day (QID) | ORAL | Status: DC | PRN
  Administered 2014-05-13 – 2014-05-24 (×5): 50 mg via ORAL
  Filled 2014-05-13 (×7): qty 2

## 2014-05-13 MED ORDER — ONDANSETRON HCL 4 MG/2ML IJ SOLN
4.0000 mg | Freq: Four times a day (QID) | INTRAMUSCULAR | Status: DC | PRN
  Administered 2014-05-14 – 2014-05-21 (×4): 4 mg via INTRAVENOUS
  Filled 2014-05-13 (×4): qty 2

## 2014-05-13 MED ORDER — HYDROMORPHONE HCL 1 MG/ML IJ SOLN
0.5000 mg | INTRAMUSCULAR | Status: DC | PRN
  Administered 2014-05-13 – 2014-05-14 (×2): 1 mg via INTRAVENOUS
  Filled 2014-05-13 (×2): qty 1

## 2014-05-13 MED ORDER — KCL IN DEXTROSE-NACL 20-5-0.45 MEQ/L-%-% IV SOLN
INTRAVENOUS | Status: DC
  Administered 2014-05-13 – 2014-05-14 (×3): via INTRAVENOUS
  Administered 2014-05-15: 1 mL via INTRAVENOUS
  Administered 2014-05-16 (×2): via INTRAVENOUS
  Administered 2014-05-16: 1 mL via INTRAVENOUS
  Administered 2014-05-17 – 2014-05-23 (×15): via INTRAVENOUS
  Filled 2014-05-13 (×33): qty 1000

## 2014-05-13 MED ORDER — FENTANYL CITRATE (PF) 100 MCG/2ML IJ SOLN
50.0000 ug | Freq: Once | INTRAMUSCULAR | Status: AC
  Administered 2014-05-13: 50 ug via INTRAVENOUS
  Filled 2014-05-13: qty 2

## 2014-05-13 MED ORDER — MORPHINE SULFATE 4 MG/ML IJ SOLN: 4.0000 mg | Freq: Once | INTRAMUSCULAR | Status: DC

## 2014-05-13 MED ORDER — ONDANSETRON HCL 4 MG/2ML IJ SOLN
4.0000 mg | Freq: Once | INTRAMUSCULAR | Status: AC
  Administered 2014-05-13: 4 mg via INTRAVENOUS
  Filled 2014-05-13: qty 2

## 2014-05-13 MED ORDER — SODIUM CHLORIDE 0.9 % IV SOLN
1.0000 g | INTRAVENOUS | Status: AC
Start: 2014-05-13 — End: 2014-05-24
  Administered 2014-05-13 – 2014-05-24 (×12): 1 g via INTRAVENOUS
  Filled 2014-05-13 (×13): qty 1

## 2014-05-13 MED ORDER — NICOTINE 21 MG/24HR TD PT24
21.0000 mg | MEDICATED_PATCH | Freq: Every day | TRANSDERMAL | Status: DC
  Administered 2014-05-13 – 2014-05-24 (×8): 21 mg via TRANSDERMAL
  Filled 2014-05-13 (×11): qty 1

## 2014-05-13 MED ORDER — ENOXAPARIN SODIUM 40 MG/0.4ML ~~LOC~~ SOLN
40.0000 mg | SUBCUTANEOUS | Status: AC
  Administered 2014-05-14: 40 mg via SUBCUTANEOUS
  Filled 2014-05-13: qty 0.4

## 2014-05-13 MED ORDER — NICOTINE 21 MG/24HR TD PT24: 21.0000 mg | MEDICATED_PATCH | Freq: Every day | TRANSDERMAL | Status: DC

## 2014-05-13 MED ORDER — PANTOPRAZOLE SODIUM 40 MG IV SOLR
40.0000 mg | Freq: Every day | INTRAVENOUS | Status: DC
  Administered 2014-05-13 – 2014-05-24 (×12): 40 mg via INTRAVENOUS
  Filled 2014-05-13 (×11): qty 40

## 2014-05-13 NOTE — ED Notes (Signed)
Report attempted 

## 2014-05-13 NOTE — ED Notes (Signed)
The patient is unable to give an urine specimen at this time. The tech has reported to the RN in charge. 

## 2014-05-13 NOTE — ED Notes (Addendum)
Pt states that she was sent here by [facility] and dr corrnett. Pt reports mid and left abdominal pain. Pt states that the plan is for her to have surgery. Reports nausea. Denies Vomitting/Diarhea

## 2014-05-13 NOTE — ED Notes (Signed)
Report attempted at 50.

## 2014-05-13 NOTE — H&P (Signed)
Tina Riggs is an 34 y.o. female.   Chief Complaint: Abdominal pain HPI: Thisis the third ED visit and admission for this patient with known diverticulitis that apparently has failed meidcal management.  She call one of our surgeons earlier today because of continuing abdominal pain.  Patient and her guest state that she could not walk without pain, had pain with bowel movements.  Took Goodies powder and Aleve for her pain, but Percocet 5/325 did not help much.  Cannot recall if she had fevers or chills.  Past Medical History  Diagnosis Date  . HPV in female   . Diverticulitis     hospitalized 04/13/2014; hospitalized 04/29/2014    Past Surgical History  Procedure Laterality Date  . Cesarean section  02/2006  . Colposcopy  Sep 2012    No family history on file. Social History:  reports that she has been smoking Cigarettes.  She has a 1.5 pack-year smoking history. She has never used smokeless tobacco. She reports that she drinks alcohol. She reports that she does not use illicit drugs.  Allergies:  Allergies  Allergen Reactions  . Penicillins Itching and Rash     (Not in a hospital admission)  Results for orders placed or performed during the hospital encounter of 05/13/14 (from the past 48 hour(s))  CBC with Differential     Status: None   Collection Time: 05/13/14  4:39 PM  Result Value Ref Range   WBC 9.3 4.0 - 10.5 K/uL   RBC 4.07 3.87 - 5.11 MIL/uL   Hemoglobin 12.1 12.0 - 15.0 g/dL   HCT 37.2 36.0 - 46.0 %   MCV 91.4 78.0 - 100.0 fL   MCH 29.7 26.0 - 34.0 pg   MCHC 32.5 30.0 - 36.0 g/dL   RDW 14.1 11.5 - 15.5 %   Platelets 334 150 - 400 K/uL   Neutrophils Relative % 69 43 - 77 %   Neutro Abs 6.5 1.7 - 7.7 K/uL   Lymphocytes Relative 22 12 - 46 %   Lymphs Abs 2.0 0.7 - 4.0 K/uL   Monocytes Relative 7 3 - 12 %   Monocytes Absolute 0.6 0.1 - 1.0 K/uL   Eosinophils Relative 2 0 - 5 %   Eosinophils Absolute 0.1 0.0 - 0.7 K/uL   Basophils Relative 0 0 - 1 %   Basophils Absolute 0.0 0.0 - 0.1 K/uL   No results found.  Review of Systems  Gastrointestinal: Positive for abdominal pain.  Neurological: Positive for weakness.  All other systems reviewed and are negative.   Blood pressure 138/82, pulse 83, temperature 98.7 F (37.1 C), temperature source Oral, resp. rate 18, last menstrual period 04/27/2014, SpO2 99 %. Physical Exam  Vitals reviewed. Constitutional: She is oriented to person, place, and time. She appears well-developed and well-nourished.  Moderately obese  HENT:  Head: Normocephalic and atraumatic.  Eyes: Conjunctivae and EOM are normal. Pupils are equal, round, and reactive to light.  Neck: Normal range of motion. Neck supple.  Cardiovascular: Normal rate, regular rhythm and normal heart sounds.   No murmur heard. Respiratory: Effort normal and breath sounds normal.  GI: Normal appearance and bowel sounds are normal. There is tenderness in the epigastric area, left upper quadrant and left lower quadrant. There is no rigidity and no rebound. Guarding: minimal guarding.  Musculoskeletal: Normal range of motion.  Neurological: She is alert and oriented to person, place, and time. She has normal reflexes.  Skin: Skin is warm and dry.  Psychiatric: She has a normal mood and affect. Her behavior is normal. Judgment and thought content normal.     Assessment/Plan Acute versus subacute versus chronic diverticulitis refractory to medical management.  Objectively she does not appear to be very ill.  I do not believe that she requires a repeat CT abdomen currently.  She is afebrile and her WBC is normal  Admit for IV Invanz, IV hydration, probably bowel prep and colectomy by Friday if she can be prepped adequately tomorrow.  She was eating normal food up until arrival, including fried chicken.  Tina Riggs 05/13/2014, 5:36 PM

## 2014-05-13 NOTE — ED Notes (Signed)
Informed patient a urine sample is needed. Pt states she does not have to urinate and will try later.

## 2014-05-13 NOTE — ED Provider Notes (Signed)
34 year old female with history of recurrent diverticulitis sent here for evaluation by [facility] with plan for elective colectomy.  She has had multiple admissions over the past several months due to recurrent diverticulitis with complications of perforation and abscess.  She reports continued pain, nausea and nausea without vomiting.  Patient was met in her room by general surgery, Dr. Hulen Skains, who will admit.  Lab work pending at this time.  Patient given dose of IV fentanyl and zofran for symptomatic control.  Larene Pickett, PA-C 05/13/14 2022  Carmin Muskrat, MD 05/13/14 [phone]

## 2014-05-13 NOTE — ED Notes (Signed)
Meal given to pt son and mother.

## 2014-05-14 LAB — BASIC METABOLIC PANEL
ANION GAP: 7 (ref 5–15)
BUN: 6 mg/dL (ref 6–20)
CALCIUM: 8.2 mg/dL — AB (ref 8.9–10.3)
CO2: 22 mmol/L (ref 22–32)
CREATININE: 0.59 mg/dL (ref 0.44–1.00)
Chloride: 109 mmol/L (ref 101–111)
GFR calc non Af Amer: >60 mL/min (ref >60)
GLUCOSE: 83 mg/dL (ref 65–99)
Potassium: 3.9 mmol/L (ref 3.5–5.1)
SODIUM: 138 mmol/L (ref 135–145)

## 2014-05-14 LAB — CBC
HCT: 34.6 % — ABNORMAL LOW (ref 36.0–46.0)
HEMOGLOBIN: 11.5 g/dL — AB (ref 12.0–15.0)
MCH: 30.7 pg (ref 26.0–34.0)
MCHC: 33.2 g/dL (ref 30.0–36.0)
MCV: 92.3 fL (ref 78.0–100.0)
Platelets: 308 10*3/uL (ref 150–400)
RBC: 3.75 MIL/uL — ABNORMAL LOW (ref 3.87–5.11)
RDW: 14.3 % (ref 11.5–15.5)
WBC: 8.3 10*3/uL (ref 4.0–10.5)

## 2014-05-14 MED ORDER — GENTAMICIN SULFATE 40 MG/ML IJ SOLN
5.0000 mg/kg | INTRAVENOUS | Status: AC
  Administered 2014-05-18: 580 mg via INTRAVENOUS
  Filled 2014-05-14: qty 14.5

## 2014-05-14 MED ORDER — MORPHINE SULFATE 2 MG/ML IJ SOLN
1.0000 mg | INTRAMUSCULAR | Status: DC | PRN
  Administered 2014-05-14: 4 mg via INTRAVENOUS
  Administered 2014-05-14: 2 mg via INTRAVENOUS
  Administered 2014-05-14: 4 mg via INTRAVENOUS
  Administered 2014-05-14: 2 mg via INTRAVENOUS
  Administered 2014-05-15 – 2014-05-16 (×4): 4 mg via INTRAVENOUS
  Administered 2014-05-16 (×3): 2 mg via INTRAVENOUS
  Administered 2014-05-16 – 2014-05-18 (×4): 4 mg via INTRAVENOUS
  Administered 2014-05-18: 2 mg via INTRAVENOUS
  Administered 2014-05-20 – 2014-05-21 (×7): 4 mg via INTRAVENOUS
  Filled 2014-05-14: qty 2
  Filled 2014-05-14: qty 1
  Filled 2014-05-14 (×6): qty 2
  Filled 2014-05-14 (×2): qty 1
  Filled 2014-05-14 (×6): qty 2
  Filled 2014-05-14 (×2): qty 1
  Filled 2014-05-14: qty 2
  Filled 2014-05-14 (×2): qty 1
  Filled 2014-05-14: qty 2
  Filled 2014-05-14: qty 1
  Filled 2014-05-14 (×2): qty 2

## 2014-05-14 MED ORDER — CLINDAMYCIN PHOSPHATE 900 MG/50ML IV SOLN: 900.0000 mg | INTRAVENOUS | Status: DC

## 2014-05-14 MED ORDER — CHLORHEXIDINE GLUCONATE CLOTH 2 % EX PADS
6.0000 | MEDICATED_PAD | Freq: Once | CUTANEOUS | Status: AC
  Administered 2014-05-14: 6 via TOPICAL

## 2014-05-14 MED ORDER — ERYTHROMYCIN BASE 250 MG PO TABS
1000.0000 mg | ORAL_TABLET | ORAL | Status: AC
  Administered 2014-05-14 (×3): 1000 mg via ORAL
  Filled 2014-05-14 (×3): qty 4

## 2014-05-14 MED ORDER — ALVIMOPAN 12 MG PO CAPS
12.0000 mg | ORAL_CAPSULE | Freq: Once | ORAL | Status: AC
  Administered 2014-05-15: 12 mg via ORAL
  Filled 2014-05-14: qty 1

## 2014-05-14 MED ORDER — CHLORHEXIDINE GLUCONATE CLOTH 2 % EX PADS
6.0000 | MEDICATED_PAD | Freq: Once | CUTANEOUS | Status: AC
  Administered 2014-05-18: 6 via TOPICAL

## 2014-05-14 MED ORDER — POLYETHYLENE GLYCOL 3350 17 GM/SCOOP PO POWD
1.0000 | Freq: Once | ORAL | Status: AC
  Administered 2014-05-14: 255 g via ORAL
  Filled 2014-05-14: qty 255

## 2014-05-14 MED ORDER — NEOMYCIN SULFATE 500 MG PO TABS
1000.0000 mg | ORAL_TABLET | ORAL | Status: AC
  Administered 2014-05-14 (×3): 1000 mg via ORAL
  Filled 2014-05-14 (×3): qty 2

## 2014-05-14 MED ORDER — GENTAMICIN SULFATE 40 MG/ML IJ SOLN: 5.0000 mg/kg | INTRAVENOUS | Status: DC

## 2014-05-14 MED ORDER — CLINDAMYCIN PHOSPHATE 900 MG/50ML IV SOLN
900.0000 mg | INTRAVENOUS | Status: AC
  Administered 2014-05-18: 900 mg via INTRAVENOUS
  Filled 2014-05-14: qty 50

## 2014-05-14 MED ORDER — ALVIMOPAN 12 MG PO CAPS: 12.0000 mg | ORAL_CAPSULE | Freq: Once | ORAL | Status: DC

## 2014-05-14 MED ORDER — FLUCONAZOLE IN SODIUM CHLORIDE 200-0.9 MG/100ML-% IV SOLN
200.0000 mg | INTRAVENOUS | Status: AC
  Administered 2014-05-14 – 2014-05-15 (×2): 200 mg via INTRAVENOUS
  Filled 2014-05-14 (×2): qty 100

## 2014-05-14 NOTE — Care Management (Signed)
UR completed. Andriy Sherk RNBSN 

## 2014-05-14 NOTE — Progress Notes (Signed)
Patient ID: Tina Riggs, female   DOB: 1980/02/06, 34 y.o.   MRN: [id number]    Subjective: Pt continues to have pain in LUQ.  Objective: Vital signs in last 24 hours: Temp:  [98.7 F (37.1 C)-99.2 F (37.3 C)] 98.8 F (37.1 C) (05/12 0500) Pulse Rate:  [63-83] 72 (05/12 0500) Resp:  [16-18] 16 (05/12 0500) BP: (120-145)/(53-98) 130/53 mmHg (05/12 0500) SpO2:  [99 %-100 %] 100 % (05/12 0500) Weight:  [116 kg (255 lb 11.7 oz)] 116 kg (255 lb 11.7 oz) (05/11 2020) Last BM Date: 05/12/14  Intake/Output from previous day: 05/11 0701 - 05/12 0700 In: 947.9 [I.V.:897.9; IV Piggyback:50] Out: -  Intake/Output this shift:    PE: Abd: soft, mildly tender in LUQ, +BS, obese Heart: regular Lungs: CTAB  Lab Results:   Recent Labs  05/13/14 1639 05/14/14 0355  WBC 9.3 8.3  HGB 12.1 11.5*  HCT 37.2 34.6*  PLT 334 308   BMET  Recent Labs  05/13/14 1639 05/14/14 0355  NA 141 138  K 4.0 3.9  CL 108 109  CO2 23 22  GLUCOSE 106* 83  BUN 8 6  CREATININE 0.73 0.59  CALCIUM 8.6* 8.2*   PT/INR No results for input(s): LABPROT, INR in the last 72 hours. CMP     Component Value Date/Time   NA 138 05/14/2014 0355   K 3.9 05/14/2014 0355   CL 109 05/14/2014 0355   CO2 22 05/14/2014 0355   GLUCOSE 83 05/14/2014 0355   BUN 6 05/14/2014 0355   CREATININE 0.59 05/14/2014 0355   CALCIUM 8.2* 05/14/2014 0355   PROT 6.4* 05/13/2014 1639   ALBUMIN 3.0* 05/13/2014 1639   AST 14* 05/13/2014 1639   ALT 10* 05/13/2014 1639   ALKPHOS 54 05/13/2014 1639   BILITOT 0.2* 05/13/2014 1639   GFRNONAA >60 05/14/2014 0355   GFRAA >60 05/14/2014 0355   Lipase     Component Value Date/Time   LIPASE 16* 05/13/2014 1639       Studies/Results: No results found.  Anti-infectives: Anti-infectives    Start     Dose/Rate Route Frequency Ordered Stop   05/15/14 0000  clindamycin (CLEOCIN) IVPB 900 mg     900 mg 100 mL/hr over 30 Minutes Intravenous 60 min pre-op 05/14/14 0919      05/15/14 0000  gentamicin (GARAMYCIN) 580 mg in dextrose 5 % 100 mL IVPB     5 mg/kg  116 kg 114.5 mL/hr over 60 Minutes Intravenous 60 min pre-op 05/14/14 0919     05/14/14 1400  neomycin (MYCIFRADIN) tablet 1,000 mg     1,000 mg Oral 3 times per day 05/14/14 0916 05/15/14 1359   05/14/14 1400  erythromycin (E-MYCIN) tablet 1,000 mg     1,000 mg Oral 3 times per day 05/14/14 0916 05/15/14 1359   05/14/14 0930  fluconazole (DIFLUCAN) IVPB 200 mg     200 mg 100 mL/hr over 60 Minutes Intravenous Every 24 hours 05/14/14 0919 05/16/14 0929   05/14/14 0916  clindamycin (CLEOCIN) IVPB 900 mg  Status:  Discontinued     900 mg 100 mL/hr over 30 Minutes Intravenous 60 min pre-op 05/14/14 0916 05/14/14 0919   05/14/14 0916  gentamicin (GARAMYCIN) 580 mg in dextrose 5 % 100 mL IVPB  Status:  Discontinued     5 mg/kg  116 kg 114.5 mL/hr over 60 Minutes Intravenous 60 min pre-op 05/14/14 0916 05/14/14 0919   05/13/14 2015  ertapenem (INVANZ) 1 g in sodium chloride  0.9 % 50 mL IVPB     1 g 100 mL/hr over 30 Minutes Intravenous Every 24 hours 05/13/14 2005         Assessment/Plan  1. Persistent diverticulitis -try to bowel prep today and plan for lap assisted partial colectomy, possible open, possible ostomy tomorrow -miralax bowel prep today -clear liquids today, NPO p MN -cont Invanz -add diflucan due to concern for vaginal yeast infection -will have [place] mark her today for possible ostomy -entereg on call to OR  LOS: 1 day    Tina Riggs E 05/14/2014, 9:20 AM Pager: [phone]

## 2014-05-14 NOTE — Progress Notes (Signed)
Patient drinking prep at bedside. Tolerating well.  Patient informed about the procedure and consent recorded in chart.  Patient clear liquid diet and will be NPO after midnight.

## 2014-05-14 NOTE — Consult Note (Signed)
WOC requested for preoperative stoma site marking  Discussed surgical procedure and stoma creation with patient and family.  Explained role of the [facility] nurse team.  Provided the patient with educational booklet/DVD and provided samples of pouching options.  Answered patient and family questions.   Examined patient lying, sitting, and standing in order to place the marking in the patient's visual field, away from any creases or abdominal contour issues and within the rectus muscle.  Attempted to mark below the patient's belt line.    Marked for colostomy in the LLQ  _6___ cm to the left of the umbilicus and _1___OX below the umbilicus.  Marked for ileostomy in the RLQ  _5___cm to the right of the umbilicus and  5____ cm below the umbilicus.  [facility] team will follow up with patient after surgery for continue ostomy care and teaching.  Jamier Urbas [place] RN,CWOCN [phone]

## 2014-05-14 NOTE — Care Management Note (Signed)
Case Management Note  Patient Details  Name: Tina Riggs MRN: [id number] Date of Birth: 12-10-1980  Subjective/Objective:                    Action/Plan:  UR completed  Expected Discharge Date:                  Expected Discharge Plan:  Home/Self Care  In-House Referral:     Discharge planning Services     Post Acute Care Choice:    Choice offered to:     DME Arranged:    DME Agency:     HH Arranged:    [place] Agency:     Status of Service:  In process, will continue to follow  Medicare Important Message Given:    Date Medicare IM Given:    Medicare IM give by:    Date Additional Medicare IM Given:    Additional Medicare Important Message give by:     If discussed at [place] of Stay Meetings, dates discussed:    Additional Comments:  Marilu Favre, RN 05/14/2014, 7:39 AM

## 2014-05-15 LAB — SURGICAL PCR SCREEN
MRSA, PCR: NEGATIVE
Staphylococcus aureus: NEGATIVE

## 2014-05-15 NOTE — Progress Notes (Signed)
[facility]  Subjective: Completed bowel prep over night and reports having countless bowel movements. Did not sleep well but feels much better this morning after receiving a bath. Is tearful and expresses multiple social concerns and stressors. States she is a single mother (32 y/o son) and multiple lengthy hospitalizations have created significant financial issues with rent and bill payments now overdue. States she is eager to have surgery as soon as possible so she can recover and return to work. No N/V overnight.  Objective: Vital signs in last 24 hours: Temp:  [98.6 F (37 C)-98.7 F (37.1 C)] 98.6 F (37 C) (05/13 0526) Pulse Rate:  [65-73] 73 (05/13 0526) Resp:  [16-17] 16 (05/13 0526) BP: (109-137)/(65-89) 109/65 mmHg (05/13 0526) SpO2:  [100 %] 100 % (05/13 0526) Last BM Date: 05/14/14  Intake/Output from previous day: 05/12 0701 - 05/13 0700 In: 1508.8 [P.O.:240; I.V.:1268.8] Out: -  Intake/Output this shift:    PE: Abd: soft, non-distended, mild LUQ tenderness, obese.   Lab Results:   Recent Labs  05/13/14 1639 05/14/14 0355  WBC 9.3 8.3  HGB 12.1 11.5*  HCT 37.2 34.6*  PLT 334 308   BMET  Recent Labs  05/13/14 1639 05/14/14 0355  NA 141 138  K 4.0 3.9  CL 108 109  CO2 23 22  GLUCOSE 106* 83  BUN 8 6  CREATININE 0.73 0.59  CALCIUM 8.6* 8.2*   PT/INR No results for input(s): LABPROT, INR in the last 72 hours. CMP     Component Value Date/Time   NA 138 05/14/2014 0355   K 3.9 05/14/2014 0355   CL 109 05/14/2014 0355   CO2 22 05/14/2014 0355   GLUCOSE 83 05/14/2014 0355   BUN 6 05/14/2014 0355   CREATININE 0.59 05/14/2014 0355   CALCIUM 8.2* 05/14/2014 0355   PROT 6.4* 05/13/2014 1639   ALBUMIN 3.0* 05/13/2014 1639   AST 14* 05/13/2014 1639   ALT 10* 05/13/2014 1639   ALKPHOS 54 05/13/2014 1639   BILITOT 0.2* 05/13/2014 1639   GFRNONAA >60 05/14/2014 0355   GFRAA >60 05/14/2014 0355   Lipase     Component Value  Date/Time   LIPASE 16* 05/13/2014 1639       Studies/Results: No results found.  Anti-infectives: Anti-infectives    Start     Dose/Rate Route Frequency Ordered Stop   05/15/14 0000  clindamycin (CLEOCIN) IVPB 900 mg     900 mg 100 mL/hr over 30 Minutes Intravenous 60 min pre-op 05/14/14 0919     05/15/14 0000  gentamicin (GARAMYCIN) 580 mg in dextrose 5 % 100 mL IVPB     5 mg/kg  116 kg 114.5 mL/hr over 60 Minutes Intravenous 60 min pre-op 05/14/14 0919     05/14/14 1400  neomycin (MYCIFRADIN) tablet 1,000 mg     1,000 mg Oral 3 times per day 05/14/14 0916 05/14/14 2242   05/14/14 1400  erythromycin (E-MYCIN) tablet 1,000 mg     1,000 mg Oral 3 times per day 05/14/14 0916 05/14/14 2242   05/14/14 0930  fluconazole (DIFLUCAN) IVPB 200 mg     200 mg 100 mL/hr over 60 Minutes Intravenous Every 24 hours 05/14/14 0919 05/15/14 0941   05/14/14 0916  clindamycin (CLEOCIN) IVPB 900 mg  Status:  Discontinued     900 mg 100 mL/hr over 30 Minutes Intravenous 60 min pre-op 05/14/14 0916 05/14/14 0919   05/14/14 0916  gentamicin (GARAMYCIN) 580 mg in dextrose 5 % 100 mL IVPB  Status:  Discontinued     5 mg/kg  116 kg 114.5 mL/hr over 60 Minutes Intravenous 60 min pre-op 05/14/14 0916 05/14/14 0919   05/13/14 2015  ertapenem (INVANZ) 1 g in sodium chloride 0.9 % 50 mL IVPB     1 g 100 mL/hr over 30 Minutes Intravenous Every 24 hours 05/13/14 2005         Assessment/Plan Completed bowel prep overnight without issue. Per discussion at morning rounds and several urgent cases posted overnight, lap assisted partial colectomy will be delayed until 05/18/2014. Will keep patient on clear liquid diet over weekend given recent completion of miralax bowel prep.  VTE/DVT: lovenox and SCD's Ambulate ad lib Continue current Invanz Will obtain SW consult given patient's social and financial concerns   LOS: 2 days    Lahoma Rocker, [facility]  05/15/2014, 9:56 AM

## 2014-05-16 NOTE — Progress Notes (Signed)
  Subjective: comfortable  Objective: Vital signs in last 24 hours: Temp:  [98.4 F (36.9 C)-98.8 F (37.1 C)] 98.6 F (37 C) (05/14 0517) Pulse Rate:  [67-79] 69 (05/14 0517) Resp:  [16-18] 16 (05/14 0517) BP: (100-129)/(72-76) 124/73 mmHg (05/14 0517) SpO2:  [100 %] 100 % (05/14 0517) Last BM Date: 05/14/14  Intake/Output from previous day: 05/13 0701 - 05/14 0700 In: 3935 [P.O.:960; I.V.:2975] Out: -  Intake/Output this shift:    Abdomen soft with mild LLQ tenderness and guarding  Lab Results:   Recent Labs  05/13/14 1639 05/14/14 0355  WBC 9.3 8.3  HGB 12.1 11.5*  HCT 37.2 34.6*  PLT 334 308   BMET  Recent Labs  05/13/14 1639 05/14/14 0355  NA 141 138  K 4.0 3.9  CL 108 109  CO2 23 22  GLUCOSE 106* 83  BUN 8 6  CREATININE 0.73 0.59  CALCIUM 8.6* 8.2*   PT/INR No results for input(s): LABPROT, INR in the last 72 hours. ABG No results for input(s): PHART, HCO3 in the last 72 hours.  Invalid input(s): PCO2, PO2  Studies/Results: No results found.  Anti-infectives: Anti-infectives    Start     Dose/Rate Route Frequency Ordered Stop   05/15/14 0000  clindamycin (CLEOCIN) IVPB 900 mg     900 mg 100 mL/hr over 30 Minutes Intravenous 60 min pre-op 05/14/14 0919     05/15/14 0000  gentamicin (GARAMYCIN) 580 mg in dextrose 5 % 100 mL IVPB     5 mg/kg  116 kg 114.5 mL/hr over 60 Minutes Intravenous 60 min pre-op 05/14/14 0919     05/14/14 1400  neomycin (MYCIFRADIN) tablet 1,000 mg     1,000 mg Oral 3 times per day 05/14/14 0916 05/14/14 2242   05/14/14 1400  erythromycin (E-MYCIN) tablet 1,000 mg     1,000 mg Oral 3 times per day 05/14/14 0916 05/14/14 2242   05/14/14 0930  fluconazole (DIFLUCAN) IVPB 200 mg     200 mg 100 mL/hr over 60 Minutes Intravenous Every 24 hours 05/14/14 0919 05/15/14 0941   05/14/14 0916  clindamycin (CLEOCIN) IVPB 900 mg  Status:  Discontinued     900 mg 100 mL/hr over 30 Minutes Intravenous 60 min pre-op 05/14/14  0916 05/14/14 0919   05/14/14 0916  gentamicin (GARAMYCIN) 580 mg in dextrose 5 % 100 mL IVPB  Status:  Discontinued     5 mg/kg  116 kg 114.5 mL/hr over 60 Minutes Intravenous 60 min pre-op 05/14/14 0916 05/14/14 0919   05/13/14 2015  ertapenem (INVANZ) 1 g in sodium chloride 0.9 % 50 mL IVPB     1 g 100 mL/hr over 30 Minutes Intravenous Every 24 hours 05/13/14 2005        Assessment/Plan: s/p * No surgery found *  Recurrent diverticulitis  Probable OR Monday for partial colectomy  LOS: 3 days    Tina Riggs A 05/16/2014

## 2014-05-17 NOTE — Progress Notes (Signed)
  Subjective: Minimal abdominal pain  Objective: Vital signs in last 24 hours: Temp:  [98.7 F (37.1 C)-99 F (37.2 C)] 98.7 F (37.1 C) (05/15 0511) Pulse Rate:  [63-69] 69 (05/15 0511) Resp:  [18] 18 (05/15 0511) BP: (103-139)/(56-78) 103/56 mmHg (05/15 0511) SpO2:  [98 %-100 %] 98 % (05/15 0511) Last BM Date: 05/15/14  Intake/Output from previous day: 05/14 0701 - 05/15 0700 In: 4155 [P.O.:980; I.V.:3025; IV Piggyback:150] Out: -  Intake/Output this shift:    Abdomen soft with minimal tenderness LLQ and no guarding  Lab Results:  No results for input(s): WBC, HGB, HCT, PLT in the last 72 hours. BMET No results for input(s): NA, K, CL, CO2, GLUCOSE, BUN, CREATININE, CALCIUM in the last 72 hours. PT/INR No results for input(s): LABPROT, INR in the last 72 hours. ABG No results for input(s): PHART, HCO3 in the last 72 hours.  Invalid input(s): PCO2, PO2  Studies/Results: No results found.  Anti-infectives: Anti-infectives    Start     Dose/Rate Route Frequency Ordered Stop   05/15/14 0000  clindamycin (CLEOCIN) IVPB 900 mg     900 mg 100 mL/hr over 30 Minutes Intravenous 60 min pre-op 05/14/14 0919     05/15/14 0000  gentamicin (GARAMYCIN) 580 mg in dextrose 5 % 100 mL IVPB     5 mg/kg  116 kg 114.5 mL/hr over 60 Minutes Intravenous 60 min pre-op 05/14/14 0919     05/14/14 1400  neomycin (MYCIFRADIN) tablet 1,000 mg     1,000 mg Oral 3 times per day 05/14/14 0916 05/14/14 2242   05/14/14 1400  erythromycin (E-MYCIN) tablet 1,000 mg     1,000 mg Oral 3 times per day 05/14/14 0916 05/14/14 2242   05/14/14 0930  fluconazole (DIFLUCAN) IVPB 200 mg     200 mg 100 mL/hr over 60 Minutes Intravenous Every 24 hours 05/14/14 0919 05/15/14 0941   05/14/14 0916  clindamycin (CLEOCIN) IVPB 900 mg  Status:  Discontinued     900 mg 100 mL/hr over 30 Minutes Intravenous 60 min pre-op 05/14/14 0916 05/14/14 0919   05/14/14 0916  gentamicin (GARAMYCIN) 580 mg in dextrose 5 %  100 mL IVPB  Status:  Discontinued     5 mg/kg  116 kg 114.5 mL/hr over 60 Minutes Intravenous 60 min pre-op 05/14/14 0916 05/14/14 0919   05/13/14 2015  ertapenem (INVANZ) 1 g in sodium chloride 0.9 % 50 mL IVPB     1 g 100 mL/hr over 30 Minutes Intravenous Every 24 hours 05/13/14 2005        Assessment/Plan: Recurrent diverticulitis  For partial colectomy tomorrow depending on schedule  LOS: 4 days    Tina Riggs A 05/17/2014

## 2014-05-18 ENCOUNTER — Encounter (HOSPITAL_COMMUNITY): Admission: EM | Disposition: A | Discharge status: Home Health | Payer: Self-pay | Source: Home / Self Care

## 2014-05-18 ENCOUNTER — Inpatient Hospital Stay (HOSPITAL_COMMUNITY): Payer: 59 | Admitting: Anesthesiology

## 2014-05-18 ENCOUNTER — Encounter (HOSPITAL_COMMUNITY): Payer: Self-pay | Admitting: Anesthesiology

## 2014-05-18 HISTORY — PX: COLOSTOMY REVISION: SHX5232

## 2014-05-18 HISTORY — PX: COLOSTOMY: SHX63

## 2014-05-18 HISTORY — PX: LAPAROSCOPIC LYSIS OF ADHESIONS: SHX5905

## 2014-05-18 LAB — CBC
HCT: 36.6 % (ref 36.0–46.0)
Hemoglobin: 12.5 g/dL (ref 12.0–15.0)
MCH: 30.5 pg (ref 26.0–34.0)
MCHC: 34.2 g/dL (ref 30.0–36.0)
MCV: 89.3 fL (ref 78.0–100.0)
Platelets: 294 10*3/uL (ref 150–400)
RBC: 4.1 MIL/uL (ref 3.87–5.11)
RDW: 13.3 % (ref 11.5–15.5)
WBC: 6.6 10*3/uL (ref 4.0–10.5)

## 2014-05-18 LAB — BASIC METABOLIC PANEL
ANION GAP: 11 (ref 5–15)
BUN: <5 mg/dL — ABNORMAL LOW (ref 6–20)
CHLORIDE: 105 mmol/L (ref 101–111)
CO2: 20 mmol/L — ABNORMAL LOW (ref 22–32)
Calcium: 8.8 mg/dL — ABNORMAL LOW (ref 8.9–10.3)
Creatinine, Ser: 0.67 mg/dL (ref 0.44–1.00)
GFR calc Af Amer: >60 mL/min (ref >60)
Glucose, Bld: 80 mg/dL (ref 65–99)
Potassium: 4.3 mmol/L (ref 3.5–5.1)
SODIUM: 136 mmol/L (ref 135–145)

## 2014-05-18 LAB — PREGNANCY, URINE: Preg Test, Ur: NEGATIVE

## 2014-05-18 SURGERY — LYSIS, ADHESIONS, LAPAROSCOPIC
Anesthesia: General | Site: Abdomen

## 2014-05-18 MED ORDER — MIDAZOLAM HCL 2 MG/2ML IJ SOLN
INTRAMUSCULAR | Status: AC
  Filled 2014-05-18: qty 2

## 2014-05-18 MED ORDER — HYDROMORPHONE HCL 1 MG/ML IJ SOLN
INTRAMUSCULAR | Status: AC
  Filled 2014-05-18: qty 1

## 2014-05-18 MED ORDER — HYDROMORPHONE HCL 1 MG/ML IJ SOLN
INTRAMUSCULAR | Status: AC
  Administered 2014-05-18: 0.5 mg via INTRAVENOUS
  Filled 2014-05-18: qty 1

## 2014-05-18 MED ORDER — ROCURONIUM BROMIDE 100 MG/10ML IV SOLN
INTRAVENOUS | Status: DC | PRN
  Administered 2014-05-18: 5 mg via INTRAVENOUS
  Administered 2014-05-18: 40 mg via INTRAVENOUS
  Administered 2014-05-18: 10 mg via INTRAVENOUS

## 2014-05-18 MED ORDER — MIDAZOLAM HCL 5 MG/5ML IJ SOLN
INTRAMUSCULAR | Status: DC | PRN
  Administered 2014-05-18: 2 mg via INTRAVENOUS

## 2014-05-18 MED ORDER — MEPERIDINE HCL 25 MG/ML IJ SOLN: 6.2500 mg | INTRAMUSCULAR | Status: DC | PRN

## 2014-05-18 MED ORDER — LIDOCAINE HCL (CARDIAC) 20 MG/ML IV SOLN
INTRAVENOUS | Status: AC
  Filled 2014-05-18: qty 5

## 2014-05-18 MED ORDER — LIDOCAINE HCL (CARDIAC) 20 MG/ML IV SOLN
INTRAVENOUS | Status: DC | PRN
  Administered 2014-05-18: 100 mg via INTRAVENOUS

## 2014-05-18 MED ORDER — BUPIVACAINE-EPINEPHRINE (PF) 0.25% -1:200000 IJ SOLN
INTRAMUSCULAR | Status: AC
  Filled 2014-05-18: qty 30

## 2014-05-18 MED ORDER — ONDANSETRON HCL 4 MG/2ML IJ SOLN: 4.0000 mg | Freq: Four times a day (QID) | INTRAMUSCULAR | Status: DC | PRN

## 2014-05-18 MED ORDER — ROCURONIUM BROMIDE 50 MG/5ML IV SOLN
INTRAVENOUS | Status: AC
  Filled 2014-05-18: qty 3

## 2014-05-18 MED ORDER — MIDAZOLAM HCL 2 MG/2ML IJ SOLN
0.5000 mg | Freq: Once | INTRAMUSCULAR | Status: AC | PRN
  Administered 2014-05-18: 2 mg via INTRAVENOUS

## 2014-05-18 MED ORDER — MORPHINE SULFATE (PF) 1 MG/ML IV SOLN
INTRAVENOUS | Status: DC
  Administered 2014-05-18: 16:00:00 via INTRAVENOUS
  Administered 2014-05-18: 9 mg via INTRAVENOUS
  Administered 2014-05-18: 10.5 mg via INTRAVENOUS
  Administered 2014-05-18: 22:00:00 via INTRAVENOUS
  Administered 2014-05-19: 17.81 mg via INTRAVENOUS
  Administered 2014-05-19: 7.9 mg via INTRAVENOUS
  Administered 2014-05-19: 10.73 mg via INTRAVENOUS
  Filled 2014-05-18 (×2): qty 25

## 2014-05-18 MED ORDER — BUPIVACAINE-EPINEPHRINE 0.25% -1:200000 IJ SOLN
INTRAMUSCULAR | Status: DC | PRN
  Administered 2014-05-18: 30 mL

## 2014-05-18 MED ORDER — DIPHENHYDRAMINE HCL 50 MG/ML IJ SOLN
12.5000 mg | Freq: Four times a day (QID) | INTRAMUSCULAR | Status: DC | PRN
  Administered 2014-05-18: 25 mg via INTRAVENOUS
  Filled 2014-05-18: qty 1

## 2014-05-18 MED ORDER — LACTATED RINGERS IV SOLN
INTRAVENOUS | Status: DC
  Administered 2014-05-18 (×3): via INTRAVENOUS

## 2014-05-18 MED ORDER — FENTANYL CITRATE (PF) 100 MCG/2ML IJ SOLN
INTRAMUSCULAR | Status: DC | PRN
  Administered 2014-05-18 (×2): 100 ug via INTRAVENOUS
  Administered 2014-05-18: 50 ug via INTRAVENOUS

## 2014-05-18 MED ORDER — ROCURONIUM BROMIDE 50 MG/5ML IV SOLN
INTRAVENOUS | Status: AC
  Filled 2014-05-18: qty 1

## 2014-05-18 MED ORDER — SODIUM CHLORIDE 0.9 % IJ SOLN
10.0000 mL | INTRAMUSCULAR | Status: DC | PRN
  Administered 2014-05-18 – 2014-05-24 (×8): 10 mL
  Administered 2014-05-24: 20 mL
  Filled 2014-05-18 (×9): qty 40

## 2014-05-18 MED ORDER — MIDAZOLAM HCL 2 MG/2ML IJ SOLN
INTRAMUSCULAR | Status: AC
Start: 2014-05-18 — End: 2014-05-19
  Filled 2014-05-18: qty 2

## 2014-05-18 MED ORDER — DEXAMETHASONE SODIUM PHOSPHATE 4 MG/ML IJ SOLN
INTRAMUSCULAR | Status: AC
  Filled 2014-05-18: qty 2

## 2014-05-18 MED ORDER — FENTANYL CITRATE (PF) 250 MCG/5ML IJ SOLN
INTRAMUSCULAR | Status: AC
  Filled 2014-05-18: qty 5

## 2014-05-18 MED ORDER — ONDANSETRON HCL 4 MG/2ML IJ SOLN
INTRAMUSCULAR | Status: DC | PRN
  Administered 2014-05-18 (×2): 4 mg via INTRAVENOUS

## 2014-05-18 MED ORDER — HYDROMORPHONE HCL 1 MG/ML IJ SOLN
INTRAMUSCULAR | Status: DC | PRN
  Administered 2014-05-18 (×2): 0.5 mg via INTRAVENOUS

## 2014-05-18 MED ORDER — ACETAMINOPHEN 10 MG/ML IV SOLN
INTRAVENOUS | Status: AC
  Filled 2014-05-18: qty 100

## 2014-05-18 MED ORDER — PHENOL 1.4 % MT LIQD
1.0000 | OROMUCOSAL | Status: DC | PRN
  Administered 2014-05-18: 1 via OROMUCOSAL
  Filled 2014-05-18: qty 177

## 2014-05-18 MED ORDER — KETOROLAC TROMETHAMINE 30 MG/ML IJ SOLN
INTRAMUSCULAR | Status: DC | PRN
  Administered 2014-05-18: 30 mg via INTRAVENOUS

## 2014-05-18 MED ORDER — PROPOFOL 10 MG/ML IV BOLUS
INTRAVENOUS | Status: DC | PRN
  Administered 2014-05-18: 170 mg via INTRAVENOUS

## 2014-05-18 MED ORDER — ACETAMINOPHEN 10 MG/ML IV SOLN
INTRAVENOUS | Status: DC | PRN
  Administered 2014-05-18: 1000 mg via INTRAVENOUS

## 2014-05-18 MED ORDER — HYDROMORPHONE HCL 1 MG/ML IJ SOLN
0.2500 mg | INTRAMUSCULAR | Status: DC | PRN
  Administered 2014-05-18 (×3): 0.5 mg via INTRAVENOUS

## 2014-05-18 MED ORDER — PROMETHAZINE HCL 25 MG/ML IJ SOLN
6.2500 mg | INTRAMUSCULAR | Status: DC | PRN
Start: 2014-05-18 — End: 2014-05-18

## 2014-05-18 MED ORDER — DIPHENHYDRAMINE HCL 12.5 MG/5ML PO ELIX: 12.5000 mg | ORAL_SOLUTION | Freq: Four times a day (QID) | ORAL | Status: DC | PRN

## 2014-05-18 MED ORDER — ONDANSETRON HCL 4 MG/2ML IJ SOLN
INTRAMUSCULAR | Status: AC
  Filled 2014-05-18: qty 4

## 2014-05-18 MED ORDER — PROPOFOL 10 MG/ML IV BOLUS
INTRAVENOUS | Status: AC
  Filled 2014-05-18: qty 20

## 2014-05-18 MED ORDER — NALOXONE HCL 0.4 MG/ML IJ SOLN: 0.4000 mg | INTRAMUSCULAR | Status: DC | PRN

## 2014-05-18 MED ORDER — SODIUM CHLORIDE 0.9 % IJ SOLN: 9.0000 mL | INTRAMUSCULAR | Status: DC | PRN

## 2014-05-18 MED ORDER — MORPHINE SULFATE (PF) 1 MG/ML IV SOLN
INTRAVENOUS | Status: AC
  Filled 2014-05-18: qty 25

## 2014-05-18 MED ORDER — NEOSTIGMINE METHYLSULFATE 10 MG/10ML IV SOLN
INTRAVENOUS | Status: DC | PRN
  Administered 2014-05-18: 4 mg via INTRAVENOUS

## 2014-05-18 MED ORDER — ONDANSETRON HCL 4 MG/2ML IJ SOLN
INTRAMUSCULAR | Status: AC
  Filled 2014-05-18: qty 2

## 2014-05-18 MED ORDER — GLYCOPYRROLATE 0.2 MG/ML IJ SOLN
INTRAMUSCULAR | Status: DC | PRN
  Administered 2014-05-18: 0.6 mg via INTRAVENOUS

## 2014-05-18 MED ORDER — 0.9 % SODIUM CHLORIDE (POUR BTL) OPTIME
TOPICAL | Status: DC | PRN
Start: 2014-05-18 — End: 2014-05-18
  Administered 2014-05-18 (×2): 1000 mL

## 2014-05-18 SURGICAL SUPPLY — 69 items
APPLIER CLIP ROT 10 11.4 M/L (STAPLE)
APR CLP MED LRG 11.4X10 (STAPLE)
CANISTER SUCTION 2500CC (MISCELLANEOUS) ×4 IMPLANT
CHLORAPREP W/TINT 26ML (MISCELLANEOUS) ×4 IMPLANT
CLIP APPLIE ROT 10 11.4 M/L (STAPLE) IMPLANT
COVER MAYO STAND STRL (DRAPES) ×8 IMPLANT
COVER SURGICAL LIGHT HANDLE (MISCELLANEOUS) ×8 IMPLANT
DRAPE LAPAROSCOPIC ABDOMINAL (DRAPES) ×4 IMPLANT
DRAPE PROXIMA HALF (DRAPES) ×4 IMPLANT
DRAPE UTILITY XL STRL (DRAPES) ×20 IMPLANT
DRAPE WARM FLUID 44X44 (DRAPE) ×4 IMPLANT
DRSG OPSITE POSTOP 4X10 (GAUZE/BANDAGES/DRESSINGS) ×3 IMPLANT
DRSG OPSITE POSTOP 4X8 (GAUZE/BANDAGES/DRESSINGS) IMPLANT
ELECT BLADE 6.5 EXT (BLADE) ×4 IMPLANT
ELECT CAUTERY BLADE 6.4 (BLADE) ×8 IMPLANT
ELECT REM PT RETURN 9FT ADLT (ELECTROSURGICAL) ×4
ELECTRODE REM PT RTRN 9FT ADLT (ELECTROSURGICAL) ×2 IMPLANT
GAUZE SPONGE 2X2 8PLY STRL LF (GAUZE/BANDAGES/DRESSINGS) ×1 IMPLANT
GEL ULTRASOUND 20GR AQUASONIC (MISCELLANEOUS) ×3 IMPLANT
GLOVE BIO SURGEON STRL SZ 6.5 (GLOVE) ×2 IMPLANT
GLOVE BIO SURGEON STRL SZ7.5 (GLOVE) ×6 IMPLANT
GLOVE BIO SURGEON STRL SZ8 (GLOVE) ×11 IMPLANT
GLOVE BIO SURGEONS STRL SZ 6.5 (GLOVE) ×1
GLOVE BIOGEL PI IND STRL 6.5 (GLOVE) ×1 IMPLANT
GLOVE BIOGEL PI IND STRL 7.0 (GLOVE) ×1 IMPLANT
GLOVE BIOGEL PI IND STRL 8 (GLOVE) ×6 IMPLANT
GLOVE BIOGEL PI INDICATOR 6.5 (GLOVE) ×2
GLOVE BIOGEL PI INDICATOR 7.0 (GLOVE) ×2
GLOVE BIOGEL PI INDICATOR 8 (GLOVE) ×8
GOWN STRL REUS W/ TWL LRG LVL3 (GOWN DISPOSABLE) ×12 IMPLANT
GOWN STRL REUS W/ TWL XL LVL3 (GOWN DISPOSABLE) ×6 IMPLANT
GOWN STRL REUS W/TWL LRG LVL3 (GOWN DISPOSABLE) ×24
GOWN STRL REUS W/TWL XL LVL3 (GOWN DISPOSABLE) ×16
KIT BASIN OR (CUSTOM PROCEDURE TRAY) ×4 IMPLANT
KIT OSTOMY DRAINABLE 2.75 STR (WOUND CARE) ×3 IMPLANT
LEGGING LITHOTOMY PAIR STRL (DRAPES) ×4 IMPLANT
LIGASURE IMPACT 36 18CM CVD LR (INSTRUMENTS) ×3 IMPLANT
NS IRRIG 1000ML POUR BTL (IV SOLUTION) ×8 IMPLANT
PAD ARMBOARD 7.5X6 YLW CONV (MISCELLANEOUS) ×8 IMPLANT
PENCIL BUTTON HOLSTER BLD 10FT (ELECTRODE) ×8 IMPLANT
RELOAD PROXIMATE 75MM BLUE (ENDOMECHANICALS) ×4 IMPLANT
RELOAD STAPLE 75 3.8 BLU REG (ENDOMECHANICALS) IMPLANT
SCALPEL HARMONIC ACE (MISCELLANEOUS) ×4 IMPLANT
SCISSORS LAP 5X35 DISP (ENDOMECHANICALS) ×4 IMPLANT
SLEEVE ENDOPATH XCEL 5M (ENDOMECHANICALS) ×4 IMPLANT
SPECIMEN JAR LARGE (MISCELLANEOUS) ×4 IMPLANT
SPONGE GAUZE 2X2 STER 10/PKG (GAUZE/BANDAGES/DRESSINGS) ×2
SPONGE LAP 18X18 X RAY DECT (DISPOSABLE) ×6 IMPLANT
STAPLER PROXIMATE 75MM BLUE (STAPLE) ×3 IMPLANT
STAPLER VISISTAT 35W (STAPLE) ×4 IMPLANT
SUCTION POOLE TIP (SUCTIONS) ×4 IMPLANT
SUT PDS AB 1 TP1 96 (SUTURE) ×8 IMPLANT
SUT SILK 2 0 SH CR/8 (SUTURE) ×4 IMPLANT
SUT SILK 2 0 TIES 10X30 (SUTURE) ×4 IMPLANT
SUT SILK 3 0 SH CR/8 (SUTURE) ×4 IMPLANT
SUT SILK 3 0 TIES 10X30 (SUTURE) ×4 IMPLANT
SUT VIC AB 3-0 SH 18 (SUTURE) ×3 IMPLANT
SYR BULB IRRIGATION 50ML (SYRINGE) ×4 IMPLANT
TOWEL OR 17X26 10 PK STRL BLUE (TOWEL DISPOSABLE) ×8 IMPLANT
TRAY FOLEY CATH 16FRSI W/METER (SET/KITS/TRAYS/PACK) ×4 IMPLANT
TRAY LAPAROSCOPIC (CUSTOM PROCEDURE TRAY) ×4 IMPLANT
TRAY PROCTOSCOPIC FIBER OPTIC (SET/KITS/TRAYS/PACK) ×4 IMPLANT
TROCAR XCEL BLUNT TIP 100MML (ENDOMECHANICALS) ×3 IMPLANT
TROCAR XCEL NON-BLD 5MMX100MML (ENDOMECHANICALS) ×4 IMPLANT
TUBE CONNECTING 12'X1/4 (SUCTIONS) ×2
TUBE CONNECTING 12X1/4 (SUCTIONS) ×6 IMPLANT
TUBING FILTER THERMOFLATOR (ELECTROSURGICAL) ×4 IMPLANT
TUBING INSUFFLATION (TUBING) ×4 IMPLANT
YANKAUER SUCT BULB TIP NO VENT (SUCTIONS) ×8 IMPLANT

## 2014-05-18 NOTE — Transfer of Care (Signed)
Immediate Anesthesia Transfer of Care Note  Patient: Tina Riggs  Procedure(s) Performed: Procedure(s): LAPAROSCOPIC MOBILIZATION OF SPLENIC FLEXURE (N/A) COLON RESECTION SIGMOID (N/A) COLOSTOMY (N/A)  Patient Location: PACU  Anesthesia Type:General  Level of Consciousness: awake, alert , oriented and patient cooperative  Airway & Oxygen Therapy: Patient Spontanous Breathing and Patient connected to nasal cannula oxygen  Post-op Assessment: Report given to RN and Post -op Vital signs reviewed and stable  Post vital signs: Reviewed and stable  Last Vitals:  Filed Vitals:   05/18/14 0952  BP: 140/63  Pulse: 58  Temp: 36.7 C  Resp: 16    Complications: No apparent anesthesia complications

## 2014-05-18 NOTE — Anesthesia Postprocedure Evaluation (Signed)
  Anesthesia Post-op Note  Patient: Tina Riggs  Procedure(s) Performed: Procedure(s): LAPAROSCOPIC MOBILIZATION OF SPLENIC FLEXURE (N/A) COLON RESECTION SIGMOID (N/A) COLOSTOMY (N/A)  Patient Location: PACU  Anesthesia Type: General   Level of Consciousness: awake, alert  and oriented  Airway and Oxygen Therapy: Patient Spontanous Breathing  Post-op Pain: mild  Post-op Assessment: Post-op Vital signs reviewed  Post-op Vital Signs: Reviewed  Last Vitals:  Filed Vitals:   05/18/14 2000  BP:   Pulse:   Temp:   Resp: 18    Complications: No apparent anesthesia complications

## 2014-05-18 NOTE — Progress Notes (Signed)
  Subjective: IV came out earlier this AM. Abdominal pain is better.  Objective: Vital signs in last 24 hours: Temp:  [98.7 F (37.1 C)-99.1 F (37.3 C)] 98.8 F (37.1 C) (05/16 0531) Pulse Rate:  [58-71] 58 (05/16 0531) Resp:  [16-18] 16 (05/16 0531) BP: (103-128)/(54-89) 103/54 mmHg (05/16 0531) SpO2:  [100 %] 100 % (05/16 0531) Last BM Date: 05/17/14  Intake/Output from previous day: 05/15 0701 - 05/16 0700 In: 2284.6 [P.O.:720; I.V.:1564.6] Out: -  Intake/Output this shift:    General appearance: alert and cooperative Resp: clear to auscultation bilaterally Cardio: regular rate and rhythm GI: soft, minimal tenderness on L  Lab Results:  No results for input(s): WBC, HGB, HCT, PLT in the last 72 hours. BMET No results for input(s): NA, K, CL, CO2, GLUCOSE, BUN, CREATININE, CALCIUM in the last 72 hours. PT/INR No results for input(s): LABPROT, INR in the last 72 hours. ABG No results for input(s): PHART, HCO3 in the last 72 hours.  Invalid input(s): PCO2, PO2  Studies/Results: No results found.  Anti-infectives: Anti-infectives    Start     Dose/Rate Route Frequency Ordered Stop   05/15/14 0000  clindamycin (CLEOCIN) IVPB 900 mg     900 mg 100 mL/hr over 30 Minutes Intravenous 60 min pre-op 05/14/14 0919     05/15/14 0000  gentamicin (GARAMYCIN) 580 mg in dextrose 5 % 100 mL IVPB     5 mg/kg  116 kg 114.5 mL/hr over 60 Minutes Intravenous 60 min pre-op 05/14/14 0919     05/14/14 1400  neomycin (MYCIFRADIN) tablet 1,000 mg     1,000 mg Oral 3 times per day 05/14/14 0916 05/14/14 2242   05/14/14 1400  erythromycin (E-MYCIN) tablet 1,000 mg     1,000 mg Oral 3 times per day 05/14/14 0916 05/14/14 2242   05/14/14 0930  fluconazole (DIFLUCAN) IVPB 200 mg     200 mg 100 mL/hr over 60 Minutes Intravenous Every 24 hours 05/14/14 0919 05/15/14 0941   05/14/14 0916  clindamycin (CLEOCIN) IVPB 900 mg  Status:  Discontinued     900 mg 100 mL/hr over 30 Minutes  Intravenous 60 min pre-op 05/14/14 0916 05/14/14 0919   05/14/14 0916  gentamicin (GARAMYCIN) 580 mg in dextrose 5 % 100 mL IVPB  Status:  Discontinued     5 mg/kg  116 kg 114.5 mL/hr over 60 Minutes Intravenous 60 min pre-op 05/14/14 0916 05/14/14 0919   05/13/14 2015  ertapenem (INVANZ) 1 g in sodium chloride 0.9 % 50 mL IVPB     1 g 100 mL/hr over 30 Minutes Intravenous Every 24 hours 05/13/14 2005        Assessment/Plan: Proximal sigmoid diverticulitis - has failed medical management. For laparoscopic assisted partial/sigmoid colectomy today with possible colostomy. Patient is aware we may not be able to safely do an anastamosis. Procedure, risks, and benefits again discussed and she agrees. Check stat labs now and will need a new IV.  LOS: 5 days    Hadasah Brugger E 05/18/2014

## 2014-05-18 NOTE — Anesthesia Preprocedure Evaluation (Addendum)
Anesthesia Evaluation  Patient identified by MRN, date of birth, ID band Patient awake    Reviewed: Allergy & Precautions, NPO status , Patient's Chart, lab work & pertinent test results  History of Anesthesia Complications Negative for: history of anesthetic complications  Airway Mallampati: II  TM Distance: >3 FB Neck ROM: Full    Dental  (+) Dental Advisory Given   Pulmonary Current Smoker,  breath sounds clear to auscultation        Cardiovascular - anginanegative cardio ROS  Rhythm:Regular Rate:Normal     Neuro/Psych negative neurological ROS     GI/Hepatic Neg liver ROS, diverticulitis   Endo/Other  Morbid obesity  Renal/GU negative Renal ROS     Musculoskeletal negative musculoskeletal ROS (+)   Abdominal (+) + obese,   Peds  Hematology negative hematology ROS (+)   Anesthesia Other Findings   Reproductive/Obstetrics                            Anesthesia Physical Anesthesia Plan  ASA: II  Anesthesia Plan: General   Post-op Pain Management:    Induction: Intravenous  Airway Management Planned: Oral ETT  Additional Equipment:   Intra-op Plan:   Post-operative Plan: Extubation in OR  Informed Consent: I have reviewed the patients History and Physical, chart, labs and discussed the procedure including the risks, benefits and alternatives for the proposed anesthesia with the patient or authorized representative who has indicated his/her understanding and acceptance.   Dental advisory given  Plan Discussed with: CRNA and Surgeon  Anesthesia Plan Comments: (Plan routine monitors, GETA)        Anesthesia Quick Evaluation

## 2014-05-18 NOTE — OR Nursing (Signed)
Converted to open abdomen procedure at 1323.

## 2014-05-18 NOTE — Progress Notes (Signed)
Peripherally Inserted Central Catheter/Midline Placement  The IV Nurse has discussed with the patient and/or persons authorized to consent for the patient, the purpose of this procedure and the potential benefits and risks involved with this procedure.  The benefits include less needle sticks, lab draws from the catheter and patient may be discharged home with the catheter.  Risks include, but not limited to, infection, bleeding, blood clot (thrombus formation), and puncture of an artery; nerve damage and irregular heat beat.  Alternatives to this procedure were also discussed.  PICC/Midline Placement Documentation        Darlyn Read 05/18/2014, 9:17 AM

## 2014-05-18 NOTE — Op Note (Signed)
05/13/2014 - 05/18/2014  2:54 PM  PATIENT:  Tina Riggs  34 y.o. female  PRE-OPERATIVE DIAGNOSIS: sigmoid diverticulitis  POST-OPERATIVE DIAGNOSIS:  Severe sigmoid diverticulitis  PROCEDURE:  Procedure(s): LAPAROSCOPIC MOBILIZATION OF SPLENIC FLEXURE COLON RESECTION SIGMOID COLOSTOMY  SURGEON:  Georganna Skeans, M.D.  ASSISTANTS: Judeth Horn, M.D.   ANESTHESIA:   local and general  EBL:  Total I/O In: 1200 [I.V.:1200] Out: 430 [Urine:330; Blood:100]  BLOOD ADMINISTERED:none  DRAINS: none   SPECIMEN:  Excision  DISPOSITION OF SPECIMEN:  PATHOLOGY  COUNTS:  YES  DICTATION: .Dragon Dictation  Tina Riggs is brought for laparoscopic assisted partial colectomy with possible colostomy for significant sigmoid diverticulitis which has been resistant to medical therapy. She was identified in preop holding area. She received IV antibiotics. Informed consent was obtained. She was brought to the operating room. General endotracheal anesthesia was administered by the anesthesia staff. Foley catheter was placed. She was placed in lithotomy position. Abdomen and perineum were prepped and draped in a sterile fashion. We did a time out procedure. Of note, she had a palpable mass in the left lower quadrant once asleep. Supraumbilical region was infiltrated with local. Supraumbilical incision was made. Subcutaneous tissues were dissected down revealing the anterior fascia. This was divided sharply and the perineal cavity was entered under direct vision. 0 Vicryl pursestring was placed around the fascial opening in the [facility] trocar was inserted. Abdomen was insufflated with carbon dioxide in standard fashion. Under direct vision, 25 mm right lateral ports were placed, one in the right upper quadrant, one in the right lower quadrant. Local was used at each port site. Laparoscopic exploration revealed a severely inflamed area of diverticulitis in the proximal sigmoid colon which was fused to the  abdominal wall. This could not be safely separated laparoscopically. At this point, we mobilized the proximal left colon along the white line. We continued mobilization up and across the splenic flexure using the harmonic scalpel. We stayed away from the spleen. This gave a lot more mobility to the colon. At this time decision was made to convert to an open procedure due to the severe inflammation of the proximal sigmoid colon. Midline incision was made continuing from our [place] port site to lower midline. 16 his tissues were dissected down and the fascia was opened to the length of the skin incision. Exploration revealed a severely inflamed proximal sigmoid colon adherent to the abdominal wall which was gradually and carefully freed up with blunt dissection. It had not perforated but was severely inflamed. The: There was mobilized from its lateral peritoneal attachments. More distally the colon was mobilized some from its lateral peritoneal attachments. Mid sigmoid colon and distally were normal. Mid sigmoid colon was divided with GIA-75 distal to the diseased part. We then stayed right along the colon and took the mesentery of the diseased portion with the LigaSure. We also placed a couple of 2-0 silk suture ligatures along the way to get good hemostasis. We stayed away from the area of the ureter by staying right along the colon edge. We continued this to proximal to the diseased portion. This was the distal left colon. This was divided with GIA-75 stapler. Specimen was marked for pathology and passed off. A photo was taken and was placed in the EPIC chart. Next we further mobilized the left colon. Decision was made, due to the severe inflammation, to not do an anastomosis. The left colon was further mobilized to prepare for placing a colostomy. Hemostasis was obtained in the  abdomen. Circular incision was made on the left abdominal wall and subcutaneous tissues were dissected down within the anterior fascia.  Cruciate incision was made in the fascia. Peritoneal cavity was entered and the opening was enlarged to 2 fingers passed easily. The distal left colon was brought out as a colostomy. It was secured inside with 2-0 silk suture. It was viable and reached outside nicely. Next the abdomen was copiously irrigated. Hemostasis was ensured. Bowel was returned to anatomic position. Fascia was closed with 2 lengths of running #1 looped PDS tied in the middle. Subcutaneous tissues were irrigated and the skin was closed with staples. All counts were correct. Colostomy was matured with 3-0 Vicryl sutures. Sterile dressings were applied. She tolerated the procedure well without apparent complication and was taken recovery in stable condition.  PATIENT DISPOSITION:  PACU - hemodynamically stable.   Delay start of Pharmacological VTE agent (>24hrs) due to surgical blood loss or risk of bleeding:  no  Georganna Skeans, MD, MPH, FACS Pager: [phone]  5/16/20162:54 PM

## 2014-05-19 MED ORDER — SODIUM CHLORIDE 0.9 % IJ SOLN: 9.0000 mL | INTRAMUSCULAR | Status: DC | PRN

## 2014-05-19 MED ORDER — WHITE PETROLATUM GEL
Status: AC
  Administered 2014-05-19: 0.2
  Filled 2014-05-19: qty 1

## 2014-05-19 MED ORDER — ENOXAPARIN SODIUM 40 MG/0.4ML ~~LOC~~ SOLN
40.0000 mg | Freq: Every day | SUBCUTANEOUS | Status: DC
  Administered 2014-05-19 – 2014-05-24 (×6): 40 mg via SUBCUTANEOUS
  Filled 2014-05-19 (×6): qty 0.4

## 2014-05-19 MED ORDER — NALOXONE HCL 0.4 MG/ML IJ SOLN: 0.4000 mg | INTRAMUSCULAR | Status: DC | PRN

## 2014-05-19 MED ORDER — DIPHENHYDRAMINE HCL 50 MG/ML IJ SOLN
12.5000 mg | Freq: Four times a day (QID) | INTRAMUSCULAR | Status: DC | PRN
  Administered 2014-05-19 – 2014-05-24 (×8): 12.5 mg via INTRAVENOUS
  Filled 2014-05-19 (×8): qty 1

## 2014-05-19 MED ORDER — FENTANYL 10 MCG/ML IV SOLN
INTRAVENOUS | Status: DC
  Administered 2014-05-19: 09:00:00 via INTRAVENOUS
  Administered 2014-05-19: 180 ug via INTRAVENOUS
  Administered 2014-05-19: 330 ug via INTRAVENOUS
  Administered 2014-05-19: 17:00:00 via INTRAVENOUS
  Administered 2014-05-20: 225 ug via INTRAVENOUS
  Administered 2014-05-20: 260 ug via INTRAVENOUS
  Administered 2014-05-20: 210 ug via INTRAVENOUS
  Administered 2014-05-20 (×2): via INTRAVENOUS
  Administered 2014-05-20: 270 ug/h via INTRAVENOUS
  Administered 2014-05-20: 120 ug via INTRAVENOUS
  Administered 2014-05-21: 75 ug via INTRAVENOUS
  Administered 2014-05-21: 248.4 ug via INTRAVENOUS
  Administered 2014-05-21: 60 ug via INTRAVENOUS
  Administered 2014-05-21: 45 ug/h via INTRAVENOUS
  Administered 2014-05-21: 9 ug via INTRAVENOUS
  Administered 2014-05-21: 240 ug via INTRAVENOUS
  Administered 2014-05-21: 05:00:00 via INTRAVENOUS
  Filled 2014-05-19 (×6): qty 50

## 2014-05-19 NOTE — Consult Note (Signed)
WOC ostomy consult note Stoma type/location: LLQ, end colostomy Stomal assessment/size: 1 3/8" round, unable to see if budded, pouch from OR yesterday intact  Peristomal assessment: not assessed Treatment options for stomal/peristomal skin: NA Output none Ostomy pouching: 2pc. In place from OR yesterday, no output.  Will order 1pc flat and barrier rings. Education provided: [employer] and DVD provided last week at the time of her preoperative stoma site marking.  Will utilize this for her education. She is very sleepy today and her pain is not well controlled with the PCA.  I will visit again with her in the am to change her OR pouch and begin education with her.   [facility] team will follow along with you for ostomy care and education. [place], [place]

## 2014-05-19 NOTE — Progress Notes (Signed)
1 Day Post-Op  Subjective: Morphine PCA not adequately controlling pain  Objective: Vital signs in last 24 hours: Temp:  [97.5 F (36.4 C)-98.8 F (37.1 C)] 98.8 F (37.1 C) (05/17 0458) Pulse Rate:  [58-90] 86 (05/17 0458) Resp:  [12-32] 18 (05/17 0552) BP: (112-140)/(63-95) 139/85 mmHg (05/17 0458) SpO2:  [98 %-100 %] 100 % (05/17 0552) Last BM Date: 05/17/14  Intake/Output from previous day: 05/16 0701 - 05/17 0700 In: 3353.4 [P.O.:195; I.V.:3158.4] Out: 1680 [Urine:1580; Blood:100] Intake/Output this shift:    General appearance: alert and cooperative Resp: clear to auscultation bilaterally Cardio: regular rate and rhythm GI: soft, dry stain under midline, stoma pink and at skin level with minimal output  Lab Results:   Recent Labs  05/18/14 0825  WBC 6.6  HGB 12.5  HCT 36.6  PLT 294   BMET  Recent Labs  05/18/14 0825  NA 136  K 4.3  CL 105  CO2 20*  GLUCOSE 80  BUN <5*  CREATININE 0.67  CALCIUM 8.8*   PT/INR No results for input(s): LABPROT, INR in the last 72 hours. ABG No results for input(s): PHART, HCO3 in the last 72 hours.  Invalid input(s): PCO2, PO2  Studies/Results: No results found.  Anti-infectives: Anti-infectives    Start     Dose/Rate Route Frequency Ordered Stop   05/15/14 0000  clindamycin (CLEOCIN) IVPB 900 mg     900 mg 100 mL/hr over 30 Minutes Intravenous 60 min pre-op 05/14/14 0919 05/18/14 1241   05/15/14 0000  gentamicin (GARAMYCIN) 580 mg in dextrose 5 % 100 mL IVPB     5 mg/kg  116 kg 114.5 mL/hr over 60 Minutes Intravenous 60 min pre-op 05/14/14 0919 05/18/14 1258   05/14/14 1400  neomycin (MYCIFRADIN) tablet 1,000 mg     1,000 mg Oral 3 times per day 05/14/14 0916 05/14/14 2242   05/14/14 1400  erythromycin (E-MYCIN) tablet 1,000 mg     1,000 mg Oral 3 times per day 05/14/14 0916 05/14/14 2242   05/14/14 0930  fluconazole (DIFLUCAN) IVPB 200 mg     200 mg 100 mL/hr over 60 Minutes Intravenous Every 24 hours  05/14/14 0919 05/15/14 0941   05/14/14 0916  clindamycin (CLEOCIN) IVPB 900 mg  Status:  Discontinued     900 mg 100 mL/hr over 30 Minutes Intravenous 60 min pre-op 05/14/14 0916 05/14/14 0919   05/14/14 0916  gentamicin (GARAMYCIN) 580 mg in dextrose 5 % 100 mL IVPB  Status:  Discontinued     5 mg/kg  116 kg 114.5 mL/hr over 60 Minutes Intravenous 60 min pre-op 05/14/14 0916 05/14/14 0919   05/13/14 2015  ertapenem (INVANZ) 1 g in sodium chloride 0.9 % 50 mL IVPB     1 g 100 mL/hr over 30 Minutes Intravenous Every 24 hours 05/13/14 2005        Assessment/Plan: s/p Procedure(s): LAPAROSCOPIC MOBILIZATION OF SPLENIC FLEXURE (N/A) COLON RESECTION SIGMOID (N/A) COLOSTOMY (N/A) POD #1 - await return of bowel function VTE - Lovenox FEN - labs in AM, change to fentanyl PCA   LOS: 6 days    Tuere Nwosu E 05/19/2014

## 2014-05-19 NOTE — Care Management Note (Signed)
Case Management Note  Patient Details  Name: Tina Riggs MRN: [id number] Date of Birth: March 10, 1980  Subjective/Objective:                    Action/Plan: UR updated   Expected Discharge Date:       05-22-14           Expected Discharge Plan:  [place]  In-House Referral:     Discharge planning Services     Post Acute Care Choice:    Choice offered to:     DME Arranged:    DME Agency:     HH Arranged:    [place] Agency:     Status of Service:  In process, will continue to follow  Medicare Important Message Given:    Date Medicare IM Given:    Medicare IM give by:    Date Additional Medicare IM Given:    Additional Medicare Important Message give by:     If discussed at [place] of Stay Meetings, dates discussed:  05-19-14  Additional Comments:  Marilu Favre, RN 05/19/2014, 8:00 AM

## 2014-05-20 ENCOUNTER — Encounter (HOSPITAL_COMMUNITY): Payer: Self-pay | Admitting: General Surgery

## 2014-05-20 LAB — CBC
HCT: 32.8 % — ABNORMAL LOW (ref 36.0–46.0)
Hemoglobin: 10.8 g/dL — ABNORMAL LOW (ref 12.0–15.0)
MCH: 30.2 pg (ref 26.0–34.0)
MCHC: 32.9 g/dL (ref 30.0–36.0)
MCV: 91.6 fL (ref 78.0–100.0)
PLATELETS: 303 10*3/uL (ref 150–400)
RBC: 3.58 MIL/uL — ABNORMAL LOW (ref 3.87–5.11)
RDW: 13.4 % (ref 11.5–15.5)
WBC: 9.3 10*3/uL (ref 4.0–10.5)

## 2014-05-20 LAB — BASIC METABOLIC PANEL
Anion gap: 6 (ref 5–15)
BUN: <5 mg/dL — ABNORMAL LOW (ref 6–20)
CALCIUM: 8.3 mg/dL — AB (ref 8.9–10.3)
CO2: 27 mmol/L (ref 22–32)
Chloride: 102 mmol/L (ref 101–111)
Creatinine, Ser: 0.73 mg/dL (ref 0.44–1.00)
GFR calc Af Amer: >60 mL/min (ref >60)
Glucose, Bld: 108 mg/dL — ABNORMAL HIGH (ref 65–99)
Potassium: 3.9 mmol/L (ref 3.5–5.1)
Sodium: 135 mmol/L (ref 135–145)

## 2014-05-20 NOTE — Consult Note (Signed)
WOC ostomy consult note Stoma type/location: LLQ, end colostomy, in abdominal crease along beltline Stomal assessment/size: 1" x 1 3/4" oval shaped, flush with dips at 3 and 9 o'clock  Peristomal assessment: intact  Treatment options for stomal/peristomal skin: added 2" barrier ring to attempt to lessen dips along the crease Output sweat, no output Ostomy pouching: 1pc flexible with 2" barrier ring cut in half and each half placed in creases at 3 and 9 o'clock  Education provided: reviewed rationale for stoma site, Dr. Grandville Silos at bedside to explain blood supply to the stoma and mobilization to chosen site.   Pouch change demonstrated with patient (her using mirror) and her significant other Glendell Docker), measured stoma and pattern left in the room.  Patient able to demonstrate lock and roll closure with [place].  Will need HHRN to support ostomy care and teaching at home.  ** May need flexible convex pouch with belt once she has output, this stoma when she sits has fairly deep crease.  I will be away the next two days however I will make sure my partners are aware of this.  Discussed coverage of ostomy pouches with patient today per her request.  Enrolled in [place] program, consent signed for program  [place], [place]

## 2014-05-20 NOTE — Progress Notes (Signed)
2 Days Post-Op  Subjective: Still not much out of stoma, walked  Objective: Vital signs in last 24 hours: Temp:  [98.6 F (37 C)-99.8 F (37.7 C)] 98.8 F (37.1 C) (05/18 0606) Pulse Rate:  [73-86] 73 (05/18 0606) Resp:  [18-30] 18 (05/18 0821) BP: (117-138)/(70-90) 117/72 mmHg (05/18 0606) SpO2:  [98 %-100 %] 100 % (05/18 0821) Last BM Date: 05/17/14  Intake/Output from previous day: 05/17 0701 - 05/18 0700 In: 3189.6 [I.V.:3189.6] Out: 1475 [Urine:1475] Intake/Output this shift: Total I/O In: 20 [P.O.:20] Out: -   General appearance: alert and cooperative Resp: clear to auscultation bilaterally Cardio: regular rate and rhythm GI: soft, stoma pink - appliance changed by WOC RN during exam, midline dressing with dry stain, quiet  Lab Results:   Recent Labs  05/18/14 0825 05/20/14 0432  WBC 6.6 9.3  HGB 12.5 10.8*  HCT 36.6 32.8*  PLT 294 303   BMET  Recent Labs  05/18/14 0825 05/20/14 0432  NA 136 135  K 4.3 3.9  CL 105 102  CO2 20* 27  GLUCOSE 80 108*  BUN <5* <5*  CREATININE 0.67 0.73  CALCIUM 8.8* 8.3*   PT/INR No results for input(s): LABPROT, INR in the last 72 hours. ABG No results for input(s): PHART, HCO3 in the last 72 hours.  Invalid input(s): PCO2, PO2  Studies/Results: No results found.  Anti-infectives: Anti-infectives    Start     Dose/Rate Route Frequency Ordered Stop   05/15/14 0000  clindamycin (CLEOCIN) IVPB 900 mg     900 mg 100 mL/hr over 30 Minutes Intravenous 60 min pre-op 05/14/14 0919 05/18/14 1241   05/15/14 0000  gentamicin (GARAMYCIN) 580 mg in dextrose 5 % 100 mL IVPB     5 mg/kg  116 kg 114.5 mL/hr over 60 Minutes Intravenous 60 min pre-op 05/14/14 0919 05/18/14 1258   05/14/14 1400  neomycin (MYCIFRADIN) tablet 1,000 mg     1,000 mg Oral 3 times per day 05/14/14 0916 05/14/14 2242   05/14/14 1400  erythromycin (E-MYCIN) tablet 1,000 mg     1,000 mg Oral 3 times per day 05/14/14 0916 05/14/14 2242   05/14/14  0930  fluconazole (DIFLUCAN) IVPB 200 mg     200 mg 100 mL/hr over 60 Minutes Intravenous Every 24 hours 05/14/14 0919 05/15/14 0941   05/14/14 0916  clindamycin (CLEOCIN) IVPB 900 mg  Status:  Discontinued     900 mg 100 mL/hr over 30 Minutes Intravenous 60 min pre-op 05/14/14 0916 05/14/14 0919   05/14/14 0916  gentamicin (GARAMYCIN) 580 mg in dextrose 5 % 100 mL IVPB  Status:  Discontinued     5 mg/kg  116 kg 114.5 mL/hr over 60 Minutes Intravenous 60 min pre-op 05/14/14 0916 05/14/14 0919   05/13/14 2015  ertapenem (INVANZ) 1 g in sodium chloride 0.9 % 50 mL IVPB     1 g 100 mL/hr over 30 Minutes Intravenous Every 24 hours 05/13/14 2005        Assessment/Plan: s/p Procedure(s): LAPAROSCOPIC MOBILIZATION OF SPLENIC FLEXURE (N/A) COLON RESECTION SIGMOID (N/A) COLOSTOMY (N/A) POD #2 - await return of bowel function. Appresiate WOC assist. VTE - Lovenox FEN - labs in AM, pain control somewhat better on Fentanyl PCA   LOS: 7 days    Kaylon Hitz E 05/20/2014

## 2014-05-21 LAB — BASIC METABOLIC PANEL
Anion gap: 7 (ref 5–15)
BUN: <5 mg/dL — ABNORMAL LOW (ref 6–20)
CALCIUM: 8.2 mg/dL — AB (ref 8.9–10.3)
CO2: 26 mmol/L (ref 22–32)
Chloride: 101 mmol/L (ref 101–111)
Creatinine, Ser: 0.61 mg/dL (ref 0.44–1.00)
GFR calc Af Amer: >60 mL/min (ref >60)
GFR calc non Af Amer: >60 mL/min (ref >60)
Glucose, Bld: 102 mg/dL — ABNORMAL HIGH (ref 65–99)
Potassium: 3.7 mmol/L (ref 3.5–5.1)
SODIUM: 134 mmol/L — AB (ref 135–145)

## 2014-05-21 LAB — CBC
HEMATOCRIT: 31.4 % — AB (ref 36.0–46.0)
Hemoglobin: 10.5 g/dL — ABNORMAL LOW (ref 12.0–15.0)
MCH: 30.4 pg (ref 26.0–34.0)
MCHC: 33.4 g/dL (ref 30.0–36.0)
MCV: 91 fL (ref 78.0–100.0)
PLATELETS: 285 10*3/uL (ref 150–400)
RBC: 3.45 MIL/uL — ABNORMAL LOW (ref 3.87–5.11)
RDW: 13.3 % (ref 11.5–15.5)
WBC: 7.7 10*3/uL (ref 4.0–10.5)

## 2014-05-21 MED ORDER — ONDANSETRON HCL 4 MG/2ML IJ SOLN
4.0000 mg | Freq: Four times a day (QID) | INTRAMUSCULAR | Status: DC | PRN
Start: 2014-05-21 — End: 2014-05-23

## 2014-05-21 MED ORDER — NALOXONE HCL 0.4 MG/ML IJ SOLN: 0.4000 mg | INTRAMUSCULAR | Status: DC | PRN

## 2014-05-21 MED ORDER — HYDROMORPHONE 0.3 MG/ML IV SOLN
INTRAVENOUS | Status: DC
Start: 2014-05-22 — End: 2014-05-23
  Administered 2014-05-21: via INTRAVENOUS
  Administered 2014-05-22 (×3): 0.6 mg via INTRAVENOUS
  Administered 2014-05-22: 0.3 mg via INTRAVENOUS
  Administered 2014-05-22: 0.6 mg via INTRAVENOUS
  Administered 2014-05-23: 0 mg via INTRAVENOUS
  Administered 2014-05-23: 0.3 mg via INTRAVENOUS
  Filled 2014-05-21: qty 25

## 2014-05-21 MED ORDER — DIPHENHYDRAMINE HCL 50 MG/ML IJ SOLN: 12.5000 mg | Freq: Four times a day (QID) | INTRAMUSCULAR | Status: DC | PRN

## 2014-05-21 MED ORDER — KETOROLAC TROMETHAMINE 15 MG/ML IJ SOLN
15.0000 mg | Freq: Four times a day (QID) | INTRAMUSCULAR | Status: DC
  Administered 2014-05-21 – 2014-05-23 (×9): 15 mg via INTRAVENOUS
  Filled 2014-05-21 (×8): qty 1

## 2014-05-21 MED ORDER — DIPHENHYDRAMINE HCL 12.5 MG/5ML PO ELIX: 12.5000 mg | ORAL_SOLUTION | Freq: Four times a day (QID) | ORAL | Status: DC | PRN

## 2014-05-21 MED ORDER — SODIUM CHLORIDE 0.9 % IJ SOLN: 9.0000 mL | INTRAMUSCULAR | Status: DC | PRN

## 2014-05-21 MED ORDER — HYDROMORPHONE HCL 1 MG/ML IJ SOLN
1.0000 mg | Freq: Once | INTRAMUSCULAR | Status: AC
  Administered 2014-05-21: 1 mg via INTRAVENOUS
  Filled 2014-05-21: qty 1

## 2014-05-21 NOTE — Progress Notes (Signed)
Patient ID: Tina Riggs, female   DOB: November 21, 1980, 34 y.o.   MRN: [id number]     [facility]      [address] [place], [place] [address]    Phone: [phone] FAX: [phone]     Subjective: Nauseated.  Taking in lots on PCA +morphine, still in severe pain.  WBC normal.  Walking.  No dysuria.    Objective:  Vital signs:  Filed Vitals:   05/21/14 0033 05/21/14 0121 05/21/14 0439 05/21/14 0457  BP:  117/76  150/104  Pulse:  86  84  Temp:  99.2 F (37.3 C)  98.3 F (36.8 C)  TempSrc:  Oral  Oral  Resp: $Remo'21 22 20 11  'gqvLz$ Height:      Weight:      SpO2: 99% 100% 100% 95%    Last BM Date: 05/17/14  Intake/Output   Yesterday:  05/18 0701 - 05/19 0700 In: 1839.3 [P.O.:200; I.V.:1639.3] Out: 400 [Urine:400] This shift:    I/O last 3 completed shifts: In: 3649.8 [P.O.:200; I.V.:3449.8] Out: 800 [Urine:800]   Physical Exam: General: Pt awake/alert/oriented x4 in no acute distress Chest: cta bilaterally.  No chest wall pain w good excursion CV:  Pulses intact.  Regular rhythm MS: Normal AROM mjr joints.  No obvious deformity Abdomen: Soft.  Nondistended. Tenderness around incision and colostomy.  Stoma is pink and viable, no function.  No evidence of peritonitis.  No incarcerated hernias. Ext:  SCDs BLE.  No mjr edema.  No cyanosis Skin: No petechiae / purpura   Problem List:   Active Problems:   Diverticulitis    Results:   Labs: Results for orders placed or performed during the hospital encounter of 05/13/14 (from the past 48 hour(s))  CBC     Status: Abnormal   Collection Time: 05/20/14  4:32 AM  Result Value Ref Range   WBC 9.3 4.0 - 10.5 K/uL   RBC 3.58 (L) 3.87 - 5.11 MIL/uL   Hemoglobin 10.8 (L) 12.0 - 15.0 g/dL   HCT 32.8 (L) 36.0 - 46.0 %   MCV 91.6 78.0 - 100.0 fL   MCH 30.2 26.0 - 34.0 pg   MCHC 32.9 30.0 - 36.0 g/dL   RDW 13.4 11.5 - 15.5 %   Platelets 303 150 - 400 K/uL  Basic metabolic panel      Status: Abnormal   Collection Time: 05/20/14  4:32 AM  Result Value Ref Range   Sodium 135 135 - 145 mmol/L   Potassium 3.9 3.5 - 5.1 mmol/L   Chloride 102 101 - 111 mmol/L   CO2 27 22 - 32 mmol/L   Glucose, Bld 108 (H) 65 - 99 mg/dL   BUN <5 (L) 6 - 20 mg/dL   Creatinine, Ser 0.73 0.44 - 1.00 mg/dL   Calcium 8.3 (L) 8.9 - 10.3 mg/dL   GFR calc non Af Amer >60 >60 mL/min   GFR calc Af Amer >60 >60 mL/min    Comment: (NOTE) The eGFR has been calculated using the CKD EPI equation. This calculation has not been validated in all clinical situations. eGFR's persistently <60 mL/min signify possible Chronic Kidney Disease.    Anion gap 6 5 - 15  CBC     Status: Abnormal   Collection Time: 05/21/14  5:17 AM  Result Value Ref Range   WBC 7.7 4.0 - 10.5 K/uL   RBC 3.45 (L) 3.87 - 5.11 MIL/uL   Hemoglobin 10.5 (L) 12.0 -  15.0 g/dL   HCT 31.4 (L) 36.0 - 46.0 %   MCV 91.0 78.0 - 100.0 fL   MCH 30.4 26.0 - 34.0 pg   MCHC 33.4 30.0 - 36.0 g/dL   RDW 13.3 11.5 - 15.5 %   Platelets 285 150 - 400 K/uL  Basic metabolic panel     Status: Abnormal   Collection Time: 05/21/14  5:17 AM  Result Value Ref Range   Sodium 134 (L) 135 - 145 mmol/L   Potassium 3.7 3.5 - 5.1 mmol/L   Chloride 101 101 - 111 mmol/L   CO2 26 22 - 32 mmol/L   Glucose, Bld 102 (H) 65 - 99 mg/dL   BUN <5 (L) 6 - 20 mg/dL   Creatinine, Ser 0.61 0.44 - 1.00 mg/dL   Calcium 8.2 (L) 8.9 - 10.3 mg/dL   GFR calc non Af Amer >60 >60 mL/min   GFR calc Af Amer >60 >60 mL/min    Comment: (NOTE) The eGFR has been calculated using the CKD EPI equation. This calculation has not been validated in all clinical situations. eGFR's persistently <60 mL/min signify possible Chronic Kidney Disease.    Anion gap 7 5 - 15    Imaging / Studies: No results found.  Medications / Allergies:  Scheduled Meds: . enoxaparin (LOVENOX) injection  40 mg Subcutaneous Daily  . ertapenem  1 g Intravenous Q24H  . fentaNYL   Intravenous 6 times  per day  . nicotine  21 mg Transdermal Daily  . pantoprazole (PROTONIX) IV  40 mg Intravenous QHS   Continuous Infusions: . dextrose 5 % and 0.45 % NaCl with KCl 20 mEq/L 100 mL/hr at 05/20/14 2148  . lactated ringers 10 mL/hr at 05/18/14 1135   PRN Meds:.diphenhydrAMINE, diphenhydrAMINE, morphine injection, naloxone **AND** sodium chloride, ondansetron, phenol, sodium chloride  Antibiotics: Anti-infectives    Start     Dose/Rate Route Frequency Ordered Stop   05/15/14 0000  clindamycin (CLEOCIN) IVPB 900 mg     900 mg 100 mL/hr over 30 Minutes Intravenous 60 min pre-op 05/14/14 0919 05/18/14 1241   05/15/14 0000  gentamicin (GARAMYCIN) 580 mg in dextrose 5 % 100 mL IVPB     5 mg/kg  116 kg 114.5 mL/hr over 60 Minutes Intravenous 60 min pre-op 05/14/14 0919 05/18/14 1258   05/14/14 1400  neomycin (MYCIFRADIN) tablet 1,000 mg     1,000 mg Oral 3 times per day 05/14/14 0916 05/14/14 2242   05/14/14 1400  erythromycin (E-MYCIN) tablet 1,000 mg     1,000 mg Oral 3 times per day 05/14/14 0916 05/14/14 2242   05/14/14 0930  fluconazole (DIFLUCAN) IVPB 200 mg     200 mg 100 mL/hr over 60 Minutes Intravenous Every 24 hours 05/14/14 0919 05/15/14 0941   05/14/14 0916  clindamycin (CLEOCIN) IVPB 900 mg  Status:  Discontinued     900 mg 100 mL/hr over 30 Minutes Intravenous 60 min pre-op 05/14/14 0916 05/14/14 0919   05/14/14 0916  gentamicin (GARAMYCIN) 580 mg in dextrose 5 % 100 mL IVPB  Status:  Discontinued     5 mg/kg  116 kg 114.5 mL/hr over 60 Minutes Intravenous 60 min pre-op 05/14/14 0916 05/14/14 0919   05/13/14 2015  ertapenem (INVANZ) 1 g in sodium chloride 0.9 % 50 mL IVPB     1 g 100 mL/hr over 30 Minutes Intravenous Every 24 hours 05/13/14 2005          Assessment/Plan Sigmoid diverticulitis POD#3 Laparoscopic mobilization of  splenic flexure, sigmoid colon resection and colostomy---Dr. Grandville Silos -stable, await bowel function -WOC for ostomy teaching -mobilize/IS VTE  prophylaxis-lovenox/SCD FEN-NPO, IVF, add toradol to PCA and breakthrough morphine IVP. ID-Invanz D#8/? For diverticulitis     Erby Pian, [facility] Pager [phone]) For consults and floor pages call [phone](7A-4:30P)  05/21/2014 7:47 AM

## 2014-05-21 NOTE — Care Management Note (Signed)
Case Management Note  Patient Details  Name: Tina Riggs MRN: [id number] Date of Birth: [birth year]  Subjective/Objective:                    Action/Plan:   Expected Discharge Date:        05-25-14          Expected Discharge Plan:  [place]  In-House Referral:     Discharge planning Services     Post Acute Care Choice:    Choice offered to:     DME Arranged:    DME Agency:     HH Arranged:    [place] Agency:     Status of Service:  In process, will continue to follow  Medicare Important Message Given:    Date Medicare IM Given:    Medicare IM give by:    Date Additional Medicare IM Given:    Additional Medicare Important Message give by:     If discussed at [place] of Stay Meetings, dates discussed:  05-21-14  Additional Comments:  Marilu Favre, RN 05/21/2014, 12:55 PM

## 2014-05-22 MED ORDER — OXYCODONE HCL 5 MG PO TABS
5.0000 mg | ORAL_TABLET | ORAL | Status: DC | PRN
  Administered 2014-05-23: 10 mg via ORAL
  Administered 2014-05-23 – 2014-05-25 (×6): 15 mg via ORAL
  Filled 2014-05-22: qty 2
  Filled 2014-05-22 (×6): qty 3

## 2014-05-22 MED ORDER — BOOST / RESOURCE BREEZE PO LIQD
1.0000 | Freq: Three times a day (TID) | ORAL | Status: DC
  Administered 2014-05-22 – 2014-05-24 (×8): 1 via ORAL

## 2014-05-22 NOTE — Progress Notes (Signed)
Changed Mrs. Thielman PCA from fentanyl to Dilaudid. Fentanyl waste was 152mcg. Witness by Esaw Dace

## 2014-05-22 NOTE — Progress Notes (Signed)
Patient ID: Tina Riggs, female   DOB: 1980/10/26, 34 y.o.   MRN: [id number]     [facility]      [address]   [place], [place] [address]    Phone: [phone] FAX: [phone]     Subjective: Pain is much better with dilaudid PCA and toradol.  Ambulating.  Started having flatus in the bag last night.  Voiding.  VSS.  Afebrile.   Objective:  Vital signs:  Filed Vitals:   05/22/14 0027 05/22/14 0149 05/22/14 0400 05/22/14 0500  BP:  124/64  109/71  Pulse:  83  67  Temp:  98.9 F (37.2 C)  98.8 F (37.1 C)  TempSrc:  Oral  Oral  Resp: 13 20 22 15   Height:      Weight:      SpO2: 100% 100% 99% 99%    Last BM Date: 05/18/14  Intake/Output   Yesterday:  05/19 0701 - 05/20 0700 In: 824 [I.V.:824] Out: 0  This shift:    I/O last 3 completed shifts: In: 1460 [P.O.:60; I.V.:1400] Out: 400 [Urine:400]      Physical Exam: General: Pt awake/alert/oriented x4 in no acute distress Chest: cta bilaterally. No chest wall pain w good excursion CV: Pulses intact. Regular rhythm MS: Normal AROM mjr joints. No obvious deformity Abdomen: Soft. Nondistended. Tenderness around incision and colostomy. Stoma is pink and viable, minimal liquid stool. No evidence of peritonitis. No incarcerated hernias. Ext: SCDs BLE. No mjr edema. No cyanosis Skin: No petechiae / purpura    Problem List:   Active Problems:   Diverticulitis    Results:   Labs: Results for orders placed or performed during the hospital encounter of 05/13/14 (from the past 48 hour(s))  CBC     Status: Abnormal   Collection Time: 05/21/14  5:17 AM  Result Value Ref Range   WBC 7.7 4.0 - 10.5 K/uL   RBC 3.45 (L) 3.87 - 5.11 MIL/uL   Hemoglobin 10.5 (L) 12.0 - 15.0 g/dL   HCT 05/23/14 (L) 63.8 - 17.7 %   MCV 91.0 78.0 - 100.0 fL   MCH 30.4 26.0 - 34.0 pg   MCHC 33.4 30.0 - 36.0 g/dL   RDW 11.6 57.9 - 03.8 %   Platelets 285 150 - 400 K/uL  Basic  metabolic panel     Status: Abnormal   Collection Time: 05/21/14  5:17 AM  Result Value Ref Range   Sodium 134 (L) 135 - 145 mmol/L   Potassium 3.7 3.5 - 5.1 mmol/L   Chloride 101 101 - 111 mmol/L   CO2 26 22 - 32 mmol/L   Glucose, Bld 102 (H) 65 - 99 mg/dL   BUN <5 (L) 6 - 20 mg/dL   Creatinine, Ser 05/23/14 0.44 - 1.00 mg/dL   Calcium 8.2 (L) 8.9 - 10.3 mg/dL   GFR calc non Af Amer >60 >60 mL/min   GFR calc Af Amer >60 >60 mL/min    Comment: (NOTE) The eGFR has been calculated using the CKD EPI equation. This calculation has not been validated in all clinical situations. eGFR's persistently <60 mL/min signify possible Chronic Kidney Disease.    Anion gap 7 5 - 15    Imaging / Studies: No results found.  Medications / Allergies:  Scheduled Meds: . enoxaparin (LOVENOX) injection  40 mg Subcutaneous Daily  . ertapenem  1 g Intravenous Q24H  . HYDROmorphone PCA 0.3 mg/mL   Intravenous 6 times per day  .  ketorolac  15 mg Intravenous 4 times per day  . nicotine  21 mg Transdermal Daily  . pantoprazole (PROTONIX) IV  40 mg Intravenous QHS   Continuous Infusions: . dextrose 5 % and 0.45 % NaCl with KCl 20 mEq/L 100 mL/hr at 05/22/14 0637  . lactated ringers 10 mL/hr at 05/18/14 1135   PRN Meds:.diphenhydrAMINE **OR** diphenhydrAMINE, diphenhydrAMINE, diphenhydrAMINE, morphine injection, naloxone **AND** sodium chloride, ondansetron, ondansetron (ZOFRAN) IV, phenol, sodium chloride  Antibiotics: Anti-infectives    Start     Dose/Rate Route Frequency Ordered Stop   05/15/14 0000  clindamycin (CLEOCIN) IVPB 900 mg     900 mg 100 mL/hr over 30 Minutes Intravenous 60 min pre-op 05/14/14 0919 05/18/14 1241   05/15/14 0000  gentamicin (GARAMYCIN) 580 mg in dextrose 5 % 100 mL IVPB     5 mg/kg  116 kg 114.5 mL/hr over 60 Minutes Intravenous 60 min pre-op 05/14/14 0919 05/18/14 1258   05/14/14 1400  neomycin (MYCIFRADIN) tablet 1,000 mg     1,000 mg Oral 3 times per day 05/14/14 0916  05/14/14 2242   05/14/14 1400  erythromycin (E-MYCIN) tablet 1,000 mg     1,000 mg Oral 3 times per day 05/14/14 0916 05/14/14 2242   05/14/14 0930  fluconazole (DIFLUCAN) IVPB 200 mg     200 mg 100 mL/hr over 60 Minutes Intravenous Every 24 hours 05/14/14 0919 05/15/14 0941   05/14/14 0916  clindamycin (CLEOCIN) IVPB 900 mg  Status:  Discontinued     900 mg 100 mL/hr over 30 Minutes Intravenous 60 min pre-op 05/14/14 0916 05/14/14 0919   05/14/14 0916  gentamicin (GARAMYCIN) 580 mg in dextrose 5 % 100 mL IVPB  Status:  Discontinued     5 mg/kg  116 kg 114.5 mL/hr over 60 Minutes Intravenous 60 min pre-op 05/14/14 0916 05/14/14 0919   05/13/14 2015  ertapenem (INVANZ) 1 g in sodium chloride 0.9 % 50 mL IVPB     1 g 100 mL/hr over 30 Minutes Intravenous Every 24 hours 05/13/14 2005         Assessment/Plan Sigmoid diverticulitis POD#4 Laparoscopic mobilization of splenic flexure, sigmoid colon resection and colostomy---Dr. Grandville Silos -stable, await bowel function -WOC for ostomy teaching -mobilize/IS VTE prophylaxis-lovenox/SCD FEN-allow clears, IVF, toradol to PCA dilaudid, but not using much. Start oxyIR scale. DC morphine IVP. ID-Invanz D#9/? For diverticulitis     Erby Pian, [facility] Pager [phone]) For consults and floor pages call [phone](7A-4:30P)  05/22/2014 7:39 AM

## 2014-05-22 NOTE — Consult Note (Addendum)
WOC ostomy follow up Stoma type/location: Stoma red and viable when viewed through ostomy pouch.  Pt declines offer of pouch change demonstration today.   States "we just did it the day before yesterday and it is fine."  Pouch is intact with good seal and no stool at this time.  Pt states she had a small amount of flatus.  Deep crease located at 3:00 o'clock and 9:00 o'clock of peristoma area.  Reviewed steps to open and close velcro to empty. Discussed pouch changing frequency and routines and ordering supplies at home.  She asks appropriate questions and states she will practice emptying in the toilet when stool begins to occur.  Supplies at bedside for staff nurse use.  Onaka Melody plans to follow-up for another pouch change on Sunday.  Pt could benefit from home health assistance after discharge. Julien Girt MSN, RN, CWOCN, CWCN-AP, CNS

## 2014-05-22 NOTE — Care Management Note (Signed)
Case Management Note  Patient Details  Name: LAURENASHLEY VIAR MRN: [id number] Date of Birth: 1980-05-14  Subjective/Objective:                    Action/Plan: Patient gave additional phone numbers   Glendell Docker [phone]   Sister Alma Friendly [phone]   Expected Discharge Date:       05-25-14            Expected Discharge Plan:  [place]  In-House Referral:     Discharge planning Services  CM Consult  Post Acute Care Choice:  Home Health Choice offered to:     DME Arranged:    DME Agency:  [place] Arranged:  RN [facility] Agency:  [place]  Status of Service:  In process, will continue to follow  Medicare Important Message Given:    Date Medicare IM Given:    Medicare IM give by:    Date Additional Medicare IM Given:    Additional Medicare Important Message give by:     If discussed at [place] of Stay Meetings, dates discussed:    Additional Comments:  Marilu Favre, RN 05/22/2014, 10:09 AM

## 2014-05-22 NOTE — Progress Notes (Signed)
Initial Nutrition Assessment  DOCUMENTATION CODES:  Morbid obesity  INTERVENTION:  Boost Breeze  NUTRITION DIAGNOSIS:  Inadequate oral intake related to altered GI function as evidenced by other (see comment) (NPO/clear liquids x 9 days).   GOAL:  Patient will meet greater than or equal to 90% of their needs   MONITOR:  PO intake, Supplement acceptance, Diet advancement, Labs, Weight trends, Skin, I & O's  REASON FOR ASSESSMENT:  NPO/Clear Liquid Diet    ASSESSMENT: Thisis the third ED visit and admission for this patient with known diverticulitis that apparently has failed meidcal management. She call one of our surgeons earlier today because of continuing abdominal pain. Patient and her guest state that she could not walk without pain, had pain with bowel movements. Took Goodies powder and Aleve for her pain, but Percocet 5/325 did not help much. Cannot recall if she had fevers or chills.  s/p Procedure(s) on 05/18/14: [place] (N/A) COLON RESECTION SIGMOID (N/A) COLOSTOMY (N/A)  Pt asleep at time of visit. Chart reviewed due to pt being NPO/clear liquids since admission (9 days).  Per surgery notes, awaiting return of bowel function. Pt had gas in bag this AM< but no BM yet since surgery. Pt was started on a clear liquid diet today; tolerating well (PO: 75%). RD will order Resource Breeze to optimize nutritional status.   Wt hx reveals wt gain within the past year. Pt was well-nourished PTA.  Labs reviewed. Na: 134, BUN <5, Calcium: 8.2, Glucose: 102.    Height:  Ht Readings from Last 1 Encounters:  05/13/14 5\' 7"  (1.702 m)    Weight:  Wt Readings from Last 1 Encounters:  05/13/14 255 lb 11.7 oz (116 kg)    Ideal Body Weight:  61.4 kg  Wt Readings from Last 10 Encounters:  05/13/14 255 lb 11.7 oz (116 kg)  04/29/14 252 lb 4.8 oz (114.443 kg)  04/18/14 247 lb (112.038 kg)  09/08/13 244 lb (110.678 kg)  07/12/12  243 lb (110.224 kg)  02/07/12 241 lb (109.317 kg)  08/10/11 235 lb (106.595 kg)  08/09/11 230 lb (104.327 kg)  12/29/10 267 lb 3.2 oz (121.201 kg)    BMI:  Body mass index is 40.04 kg/(m^2). Extreme obesity, class III  Estimated Nutritional Needs:  Kcal:  1900-2100  Protein:  85-95 grams  Fluid:  1.9-2.1 L  Skin:  Reviewed, no issues (closed abdominal incision)  Diet Order:  Diet clear liquid Room service appropriate?: Yes; Fluid consistency:: Thin  EDUCATION NEEDS:  No education needs identified at this time   Intake/Output Summary (Last 24 hours) at 05/22/14 1415 Last data filed at 05/22/14 0600  Gross per 24 hour  Intake    824 ml  Output      0 ml  Net    824 ml    Last BM:  05/18/14  Megha Agnes A. Jimmye Norman, RD, LDN, CDE Pager: [phone] After hours Pager: [phone]

## 2014-05-23 MED ORDER — KETOROLAC TROMETHAMINE 15 MG/ML IJ SOLN
15.0000 mg | Freq: Four times a day (QID) | INTRAMUSCULAR | Status: DC | PRN
  Administered 2014-05-23: 15 mg via INTRAVENOUS
  Filled 2014-05-23 (×2): qty 1

## 2014-05-23 MED ORDER — HYDROMORPHONE HCL 1 MG/ML IJ SOLN
0.5000 mg | INTRAMUSCULAR | Status: DC | PRN
  Administered 2014-05-23 – 2014-05-25 (×7): 2 mg via INTRAVENOUS
  Filled 2014-05-23 (×7): qty 2

## 2014-05-23 NOTE — Progress Notes (Signed)
5 Days Post-Op  Subjective: Having flatus, tol clears, no n/v, pain well controlled, ambulating in room  Objective: Vital signs in last 24 hours: Temp:  [98.8 F (37.1 C)-99.7 F (37.6 C)] 99 F (37.2 C) (05/21 0441) Pulse Rate:  [59-83] 79 (05/21 0441) Resp:  [14-27] 18 (05/21 0441) BP: (108-130)/(60-78) 130/78 mmHg (05/21 0441) SpO2:  [98 %-100 %] 99 % (05/21 0441) Last BM Date: 05/18/14  Intake/Output from previous day: 05/20 0701 - 05/21 0700 In: 2728.3 [P.O.:240; I.V.:2488.3] Out: 0  Intake/Output this shift:    General appearance: no distress Resp: clear to auscultation bilaterally Cardio: regular rate and rhythm GI: stoma flat but pink, functional, incisions clean without infection, bs present approp tender  Lab Results:   Recent Labs  05/21/14 0517  WBC 7.7  HGB 10.5*  HCT 31.4*  PLT 285   BMET  Recent Labs  05/21/14 0517  NA 134*  K 3.7  CL 101  CO2 26  GLUCOSE 102*  BUN <5*  CREATININE 0.61  CALCIUM 8.2*   PT/INR No results for input(s): LABPROT, INR in the last 72 hours. ABG No results for input(s): PHART, HCO3 in the last 72 hours.  Invalid input(s): PCO2, PO2  Studies/Results: No results found.  Anti-infectives: Anti-infectives    Start     Dose/Rate Route Frequency Ordered Stop   05/15/14 0000  clindamycin (CLEOCIN) IVPB 900 mg     900 mg 100 mL/hr over 30 Minutes Intravenous 60 min pre-op 05/14/14 0919 05/18/14 1241   05/15/14 0000  gentamicin (GARAMYCIN) 580 mg in dextrose 5 % 100 mL IVPB     5 mg/kg  116 kg 114.5 mL/hr over 60 Minutes Intravenous 60 min pre-op 05/14/14 0919 05/18/14 1258   05/14/14 1400  neomycin (MYCIFRADIN) tablet 1,000 mg     1,000 mg Oral 3 times per day 05/14/14 0916 05/14/14 2242   05/14/14 1400  erythromycin (E-MYCIN) tablet 1,000 mg     1,000 mg Oral 3 times per day 05/14/14 0916 05/14/14 2242   05/14/14 0930  fluconazole (DIFLUCAN) IVPB 200 mg     200 mg 100 mL/hr over 60 Minutes Intravenous Every  24 hours 05/14/14 0919 05/15/14 0941   05/14/14 0916  clindamycin (CLEOCIN) IVPB 900 mg  Status:  Discontinued     900 mg 100 mL/hr over 30 Minutes Intravenous 60 min pre-op 05/14/14 0916 05/14/14 0919   05/14/14 0916  gentamicin (GARAMYCIN) 580 mg in dextrose 5 % 100 mL IVPB  Status:  Discontinued     5 mg/kg  116 kg 114.5 mL/hr over 60 Minutes Intravenous 60 min pre-op 05/14/14 0916 05/14/14 0919   05/13/14 2015  ertapenem (INVANZ) 1 g in sodium chloride 0.9 % 50 mL IVPB     1 g 100 mL/hr over 30 Minutes Intravenous Every 24 hours 05/13/14 2005        Assessment/Plan: Sigmoid diverticulitis POD#5 Laparoscopic mobilization of splenic flexure, sigmoid colon resection and colostomy---Dr. Grandville Silos 1. Will dc pca today, continue toradol (check bmet in am), oxy plus iv for backup 2. pulm toilet oob ambulate 3. Full liquids 4. invanz day 10, will discuss timing but should not need much more s/p surgery 5. lovenox scds      [facility] 05/23/2014

## 2014-05-24 LAB — BASIC METABOLIC PANEL
ANION GAP: 5 (ref 5–15)
BUN: <5 mg/dL — ABNORMAL LOW (ref 6–20)
CALCIUM: 8.2 mg/dL — AB (ref 8.9–10.3)
CO2: 26 mmol/L (ref 22–32)
Chloride: 104 mmol/L (ref 101–111)
Creatinine, Ser: 0.72 mg/dL (ref 0.44–1.00)
GFR calc Af Amer: >60 mL/min (ref >60)
Glucose, Bld: 105 mg/dL — ABNORMAL HIGH (ref 65–99)
Potassium: 4 mmol/L (ref 3.5–5.1)
Sodium: 135 mmol/L (ref 135–145)

## 2014-05-24 MED ORDER — WHITE PETROLATUM GEL
Status: AC
  Administered 2014-05-24: 21:00:00
  Filled 2014-05-24: qty 1

## 2014-05-24 NOTE — Progress Notes (Signed)
Patient hands have been itching. Patient wants doctor to know.

## 2014-05-24 NOTE — Progress Notes (Signed)
6 Days Post-Op  Subjective: Flatus and stool in stoma, tol fulls, ambulating some itching of hands overnight better this am  Objective: Vital signs in last 24 hours: Temp:  [97.9 F (36.6 C)-99.1 F (37.3 C)] 99.1 F (37.3 C) (05/22 0545) Pulse Rate:  [69-89] 89 (05/22 0545) Resp:  [18] 18 (05/22 0545) BP: (123-142)/(71-98) 133/82 mmHg (05/22 0545) SpO2:  [99 %-100 %] 100 % (05/22 0545) Last BM Date: 05/18/14  Intake/Output from previous day: 05/21 0701 - 05/22 0700 In: 2412.5 [P.O.:640; I.V.:1772.5] Out: 5 [Stool:5] Intake/Output this shift:    General appearance: no distress Resp: clear to auscultation bilaterally Cardio: regular rate and rhythm GI: incisions clean, stoma flat  but pink and functional bs present  Lab Results:  No results for input(s): WBC, HGB, HCT, PLT in the last 72 hours. BMET  Recent Labs  05/24/14 0545  NA 135  K 4.0  CL 104  CO2 26  GLUCOSE 105*  BUN <5*  CREATININE 0.72  CALCIUM 8.2*   PT/INR No results for input(s): LABPROT, INR in the last 72 hours. ABG No results for input(s): PHART, HCO3 in the last 72 hours.  Invalid input(s): PCO2, PO2  Studies/Results: No results found.  Anti-infectives: Anti-infectives    Start     Dose/Rate Route Frequency Ordered Stop   05/15/14 0000  clindamycin (CLEOCIN) IVPB 900 mg     900 mg 100 mL/hr over 30 Minutes Intravenous 60 min pre-op 05/14/14 0919 05/18/14 1241   05/15/14 0000  gentamicin (GARAMYCIN) 580 mg in dextrose 5 % 100 mL IVPB     5 mg/kg  116 kg 114.5 mL/hr over 60 Minutes Intravenous 60 min pre-op 05/14/14 0919 05/18/14 1258   05/14/14 1400  neomycin (MYCIFRADIN) tablet 1,000 mg     1,000 mg Oral 3 times per day 05/14/14 0916 05/14/14 2242   05/14/14 1400  erythromycin (E-MYCIN) tablet 1,000 mg     1,000 mg Oral 3 times per day 05/14/14 0916 05/14/14 2242   05/14/14 0930  fluconazole (DIFLUCAN) IVPB 200 mg     200 mg 100 mL/hr over 60 Minutes Intravenous Every 24 hours  05/14/14 0919 05/15/14 0941   05/14/14 0916  clindamycin (CLEOCIN) IVPB 900 mg  Status:  Discontinued     900 mg 100 mL/hr over 30 Minutes Intravenous 60 min pre-op 05/14/14 0916 05/14/14 0919   05/14/14 0916  gentamicin (GARAMYCIN) 580 mg in dextrose 5 % 100 mL IVPB  Status:  Discontinued     5 mg/kg  116 kg 114.5 mL/hr over 60 Minutes Intravenous 60 min pre-op 05/14/14 0916 05/14/14 0919   05/13/14 2015  ertapenem (INVANZ) 1 g in sodium chloride 0.9 % 50 mL IVPB     1 g 100 mL/hr over 30 Minutes Intravenous Every 24 hours 05/13/14 2005        Assessment/Plan: Sigmoid diverticulitis POD#6 Laparoscopic mobilization of splenic flexure, sigmoid colon resection and colostomy---Dr. Grandville Silos 1. Oral pain meds 2. pulm toilet oob ambulate 3. Advance diet 4. invanz day 11, this will be one week after surgery tomorrow, can stop after next dose 5. lovenox scds 6. Home tomorrow if does well  [facility] 05/24/2014

## 2014-05-24 NOTE — Consult Note (Signed)
WOC ostomy follow up Stoma type/location: end colostomy, LLQ Stomal assessment/size: 1" x 13/4" oval stoma, flush with skin, in abdominal crease along beltline Peristomal assessment: intact Treatment options for stomal/peristomal skin: added 1/2 of 2" barrier ring at 3 and 9 oclock to flatten crease a bit Output liquid brown stool  Ostomy pouching: 1pc.ctf, with barrier ring.  Pt had pouch intact from my visit 05/20/14, however she has just started to have output in the last 24 hours, she has flatus Education provided: patient removed old pouch, cleaned her skin. She is independent with emptying and cleaning spout of pouch with wick.  Recommended no use of bleach wipes any longer on the pouch.   Significant other drew pattern and cut new pouch.  Pt and WOC placed barrier ring on skin.  Glendell Docker and [place] nurse placed new pouch and Glendell Docker removed the tape borders for application.  Pt closed pouch and we reviewed all steps again.  I have provided them with 5 flat pouchs and 2 flexible convex pouchs for home.  Contact information for WOC provided  Enrolled patient in [place] Start Discharge program: Yes  WOC will follow up with patient in the am prior to DC to home, will need [facility] for support once at home.  Baani Bober [place] RN,CWOCN  [phone]

## 2014-05-25 MED ORDER — OXYCODONE HCL 5 MG PO TABS: 5.0000 mg | ORAL_TABLET | ORAL | Status: DC | PRN

## 2014-05-25 MED ORDER — PANTOPRAZOLE SODIUM 40 MG PO TBEC
40.0000 mg | DELAYED_RELEASE_TABLET | Freq: Every day | ORAL | Status: DC
Start: 2014-05-25 — End: 2014-05-25

## 2014-05-25 MED ORDER — METHOCARBAMOL 500 MG PO TABS: 500.0000 mg | ORAL_TABLET | Freq: Four times a day (QID) | ORAL | Status: DC | PRN

## 2014-05-25 NOTE — Discharge Instructions (Signed)
[facility]      [facility] °[phone] ° °OPEN ABDOMINAL SURGERY: POST OP INSTRUCTIONS ° °Always review your discharge instruction sheet given to you by the facility where your surgery was performed. ° °IF YOU HAVE DISABILITY OR FAMILY LEAVE FORMS, YOU MUST BRING THEM TO THE OFFICE FOR PROCESSING.  PLEASE DO NOT GIVE THEM TO YOUR DOCTOR. ° °1. A prescription for pain medication may be given to you upon discharge.  Take your pain medication as prescribed, if needed.  If narcotic pain medicine is not needed, then you may take acetaminophen (Tylenol) or ibuprofen (Advil) as needed. °2. Take your usually prescribed medications unless otherwise directed. °3. If you need a refill on your pain medication, please contact your pharmacy. They will contact our office to request authorization.  Prescriptions will not be filled after 5pm or on week-ends. °4. You should follow a light diet the first few days after arrival home, such as soup and crackers, pudding, etc.unless your doctor has advised otherwise. A high-fiber, low fat diet can be resumed as tolerated.   Be sure to include lots of fluids daily. Most patients will experience some swelling and bruising on the chest and neck area.  Ice packs will help.  Swelling and bruising can take several days to resolve °5. Most patients will experience some swelling and bruising in the area of the incision. Ice pack will help. Swelling and bruising can take several days to resolve..  °6. It is common to experience some constipation if taking pain medication after surgery.  Increasing fluid intake and taking a stool softener will usually help or prevent this problem from occurring.  A mild laxative (Milk of Magnesia or Miralax) should be taken according to package directions if there are no bowel movements after 48 hours. °7.  You may have steri-strips (small skin tapes) in place directly over the incision.  These strips should be left on the skin for 7-10 days.  If your  surgeon used skin glue on the incision, you may shower in 24 hours.  The glue will flake off over the next 2-3 weeks.  Any sutures or staples will be removed at the office during your follow-up visit. You may find that a light gauze bandage over your incision may keep your staples from being rubbed or pulled. You may shower and replace the bandage daily. °8. ACTIVITIES:  You may resume regular (light) daily activities beginning the next day--such as daily self-care, walking, climbing stairs--gradually increasing activities as tolerated.  You may have sexual intercourse when it is comfortable.  Refrain from any heavy lifting or straining until approved by your doctor. °a. You may drive when you no longer are taking prescription pain medication, you can comfortably wear a seatbelt, and you can safely maneuver your car and apply brakes °b. Return to Work: ___________________________________ °9. You should see your doctor in the office for a follow-up appointment approximately two weeks after your surgery.  Make sure that you call for this appointment within a day or two after you arrive home to insure a convenient appointment time. °OTHER INSTRUCTIONS:  °_____________________________________________________________ °_____________________________________________________________ ° °WHEN TO CALL YOUR DOCTOR: °1. Fever over 101.0 °2. Inability to urinate °3. Nausea and/or vomiting °4. Extreme swelling or bruising °5. Continued bleeding from incision. °6. Increased pain, redness, or drainage from the incision. °7. Difficulty swallowing or breathing °8. Muscle cramping or spasms. °9. Numbness or tingling in hands or feet or around lips. ° °The clinic staff is available to   answer your questions during regular business hours.  Please don’t hesitate to call and ask to speak to one of the nurses if you have concerns. ° °For further questions, please visit [email or web address] ° °Colostomy Home Guide °A colostomy is an  opening for stool to leave your body when a medical condition prevents it from leaving through the usual opening (rectum). During a surgery, a piece of large intestine (colon) is brought through a hole in the abdominal wall. The new opening is called a stoma or ostomy. A bag or pouch fits over the stoma to catch stool and gas. Your stool may be liquid, somewhat pasty, or formed. °CARING FOR YOUR STOMA  °Normally, the stoma looks a lot like the inside of your cheek: pink, red, and moist. At first it may be swollen, but this swelling will decrease within 6 weeks. °Keep the skin around your stoma clean and dry. You can gently wash your stoma and the skin around your stoma in the shower with a clean, soft washcloth. If you develop any skin irritation, your caregiver may give you a stoma powder or ointment to help heal the area. Do not use any products other than those specifically given to you by your caregiver.  °Your stoma should not be uncomfortable. If you notice any stinging or burning, your pouch may be leaking, and the skin around your stoma may be coming into contact with stool. This can cause skin irritation. If you notice stinging, replace your pouch with a new one and discard the old one. °OSTOMY POUCHES  °The pouch that fits over the ostomy can be made up of either 1 or 2 pieces. A one-piece pouch has a skin barrier piece and the pouch itself in one unit. A two-piece pouch has a skin barrier with a separate pouch that snaps on and off of the skin barrier. Either way, you should empty the pouch when it is only  to ½ full. Do not let more stool or gas build up. This could cause the pouch to leak. °Some ostomy bags have a built-in gas release valve. Ostomy deodorizer (5 drops) can be put into the pouch to prevent odor. Some people use ostomy lubricant drops inside the pouch to help the stool slide out of the bag more easily and completely.  °EMPTYING YOUR OSTOMY POUCH  °You may get lessons on how to empty your  pouch from a wound-ostomy nurse before you leave the hospital. Here are the basic steps: °· Wash your hands with soap and water. °· Sit far back on the toilet. °· Put several pieces of toilet paper into the toilet water. This will prevent splashing as you empty the stool into the toilet bowl. °· Unclip or unvelcro the tail end of the pouch. °· Unroll the tail and empty stool into the toilet. °· Clean the tail with toilet paper. °· Reroll the tail, and clip or velcro it closed. °· Wash your hands again. °CHANGING YOUR OSTOMY POUCH  °Change your ostomy pouch about every 3 to 4 days for the first 6 weeks, then every 5 to7 days. Always change the bag sooner if there is any leakage or you begin to notice any discomfort or irritation of the skin around the stoma. When possible, plan to change your ostomy pouch before eating or drinking as this will lessen the chance of stool coming out during the pouch change. A wound-ostomy nurse may teach you how to change your pouch before you leave the hospital. Here are   the basic steps:  Hoyle Barr out your supplies.  Wash your hands with soap and water.  Carefully remove the old pouch.  Wash the stoma and allow it to dry. Men may be advised to shave any hair around the stoma very carefully. This will make the adhesive stick better.  Use the stoma measuring guide that comes with your pouch set to decide what size hole you will need to cut in the skin barrier piece. Choose the smallest possible size that will hold the stoma but will not touch it.  Use the guide to trace the circle on the back of the skin barrier piece. Cut out the hole.  Hold the skin barrier piece over the stoma to make sure the hole is the correct size.  Remove the adhesive paper backing from the skin barrier piece.  Squeeze stoma paste around the opening of the skin barrier piece.  Clean and dry the skin around the stoma again.  Carefully fit the skin barrier piece over your stoma.  If you are  using a two-piece pouch, snap the pouch onto the skin barrier piece.  Close the tail of the pouch.  Put your hand over the top of the skin barrier piece to help warm it for about 5 minutes, so that it conforms to your body better.  Wash your hands again. DIET TIPS   Continue to follow your usual diet.  Drink about eight 8 oz glasses of water each day.  You can prevent gas by eating slowly and chewing your food thoroughly.  If you feel concerned that you have too much gas, you can cut back on gas-producing foods, such as:  Spicy foods.  Onions and garlic.  Cruciferous vegetables (cabbage, broccoli, cauliflower, Brussels sprouts).  Beans and legumes.  Some cheeses.  Eggs.  Fish.  Bubbly (carbonated) drinks.  Chewing gum. GENERAL TIPS   You can shower with or without the bag in place.  Always keep the bag on if you are bathing or swimming.  If your bag gets wet, you can dry it with a blow-dryer set to cool.  Avoid wearing tight clothing directly over your stoma so that it does not become irritated or bleed. Tight clothing can also prevent stool from draining into the pouch.  It is helpful to always have an extra skin barrier and pouch with you when traveling. Do not leave them anywhere too warm, as parts of them can melt.  Do not let your seat belt rest on your stoma. Try to keep the seat belt either above or below your stoma, or use a tiny pillow to cushion it.  You can still participate in sports, but you should avoid activities in which there is a risk of getting hit in the abdomen.  You can still have sex. It is a good idea to empty your pouch prior to sex. Some people and their partners feel very comfortable seeing the pouch during sex. Others choose to wear lingerie or a T-shirt that covers the device. SEEK IMMEDIATE MEDICAL CARE IF:  You notice a change in the size or color of the stoma, especially if it becomes very red, purple, black, or pale white.  You  have bloody stools or bleeding from the stoma.  You have abdominal pain, nausea, vomiting, or bloating.  There is anything unusual protruding from the stoma.  You have irritation or red skin around the stoma.  No stool is passing from the stoma.  You have diarrhea (requiring more frequent  than normal pouch emptying). Document Released: 12/22/2002 Document Revised: 03/13/2011 Document Reviewed: 05/18/2010 [facility] Patient Information 2015 [place], [place]. This information is not intended to replace advice given to you by your health care provider. Make sure you discuss any questions you have with your health care provider.

## 2014-05-25 NOTE — Progress Notes (Signed)
Tina Riggs to be D/C'd Home per MD order.  Discussed with the patient and all questions fully answered.  VSS, Surgical site clean, dry, intact with no sign of infection. Staples removed and steri-strips placed per PA order.   PICC line discontinued by IV team. Site clean, dry, intact with no complication noted.  An After Visit Summary was printed and given to the patient. Patient received prescriptions.  D/c education completed with patient/family including follow up instructions, medication list, d/c activities limitations if indicated, with other d/c instructions as indicated by MD - patient able to verbalize understanding, all questions fully answered.   Patient instructed to return to ED, call 911, or call MD for any changes in condition.   Patient escorted via [place], and D/C home via private auto.  Micki Riley 05/25/2014 9:19 AM

## 2014-05-25 NOTE — Consult Note (Signed)
WOC stopped by for visit just prior to DC to home, pt had left me a message that she and nurse changed pouch over to the flexible convex pouch yesterday for her comfort and that she prefers this for comfort reason. Hopefully this with the use of barrier ring will lessen the crease in her abdomen enough to lessen the changes for leakage.  I will notify [facility] of same and [place] so they may assist with her connection to appropriate DME provider for same.  Adrienne Delay Trent RN,CWOCN [phone]

## 2014-05-25 NOTE — Discharge Summary (Signed)
Patient ID: APRILLE SAWHNEY MRN: [id number] DOB/AGE: 1980/05/26 34 y.o.  Admit date: 05/13/2014 Discharge date: 05/25/2014  Procedures: LAPAROSCOPIC MOBILIZATION OF SPLENIC FLEXURE COLON RESECTION SIGMOID COLOSTOMY  Dr. Georganna Skeans  Consults: None  Reason for Admission: Thisis the third ED visit and admission for this patient with known diverticulitis that apparently has failed meidcal management. She call one of our surgeons earlier today because of continuing abdominal pain. Patient and her guest state that she could not walk without pain, had pain with bowel movements. Took Goodies powder and Aleve for her pain, but Percocet 5/325 did not help much. Cannot recall if she had fevers or chills.  Admission Diagnoses:  1. Acute on chronic diverticulitis refractory to medical management  Hospital Course: The patient was admitted and restarted on IV Invanz.  She was given clear liquids and a bowel prep was started.  She was able to be taken to the OR on HD 4 where she underwent the above procedure.  She had a post op ileus the first several days.  She finally started having some flatus in her bag on POD 4.  She was given clear liquids.  Her diet was able to be advanced as tolerated from that time.  She continued Invanz for a total of 11 days, including 7 days post op, then this was discontinued.  Her staples were removed on POD 7, prior to dc home.  HH was arranged for colostomy care.  She was otherwise stable on POD 7, for DC home.  PE: Abd: soft, appropriately tender, staples in place and incisions are c/d/i, +BS, ostomy with stool and air in bag.  Discharge Diagnoses:  Active Problems:   Diverticulitis s/p lap converted to open sigmoid colectomy/colostomy  Discharge Medications:   Medication List    STOP taking these medications        metroNIDAZOLE 500 MG tablet  Commonly known as:  FLAGYL     oxyCODONE-acetaminophen 5-325 MG per tablet  Commonly known as:  PERCOCET/ROXICET      TAKE these medications        etonogestrel 68 MG Impl implant  Commonly known as:  NEXPLANON  1 each by Subdermal route once. 2013     ibuprofen 200 MG tablet  Commonly known as:  ADVIL,MOTRIN  Take 600 mg by mouth every 6 (six) hours as needed for mild pain.     methocarbamol 500 MG tablet  Commonly known as:  ROBAXIN  Take 1 tablet (500 mg total) by mouth every 6 (six) hours as needed for muscle spasms.     naproxen sodium 220 MG tablet  Commonly known as:  ANAPROX  Take 440 mg by mouth daily as needed (pain).     nicotine 21 mg/24hr patch  Commonly known as:  NICODERM CQ - dosed in mg/24 hours  Place 1 patch (21 mg total) onto the skin daily.     oxyCODONE 5 MG immediate release tablet  Commonly known as:  Oxy IR/ROXICODONE  Take 1-3 tablets (5-15 mg total) by mouth every 4 (four) hours as needed for moderate pain or severe pain.        Discharge Instructions:     Follow-up Information    Follow up with [facility], MD. Schedule an appointment as soon as possible for a visit in 3 weeks.   Specialty:  General Surgery   Contact information:   [address] [place] [address] [phone]       Signed: Henreitta Cea  05/25/2014, 8:34 AM

## 2014-05-25 NOTE — Progress Notes (Signed)
Per MD order, PICC line removed. Cath intact at 41cm. Vaseline pressure gauze to site, pressure held x 5min. No bleeding to site. Pt instructed to keep dressing CDI x 24 hours. Avoid heavy lifting, pushing or pulling x 24 hours,  If bleeding occurs hold pressure, if bleeding does not stop contact MD or go to the ED. Pt does not have any questions.  Ravinder Lukehart M  

## 2014-08-13 ENCOUNTER — Telehealth: Payer: Self-pay | Admitting: *Deleted

## 2014-08-13 NOTE — Telephone Encounter (Signed)
Patient is interested in a Nexplanon removal. Patient is also due for an annual exam. Patient states she had her Nexplanon place at our office in May 2013 and is not interested in having it replaced. Patient is thinking about trying to conceive. Patient has been scheduled for her annual exam on 08/18/14 @ 9:30 am. Patient advised due to insurance reasons we would schedule her removal for her nexplanon after her annual exam.

## 2014-08-18 ENCOUNTER — Ambulatory Visit: Payer: Self-pay | Admitting: Certified Nurse Midwife

## 2014-08-18 ENCOUNTER — Encounter: Payer: Self-pay | Admitting: Certified Nurse Midwife

## 2014-08-18 ENCOUNTER — Other Ambulatory Visit: Payer: Self-pay | Admitting: Certified Nurse Midwife

## 2014-08-18 ENCOUNTER — Ambulatory Visit (INDEPENDENT_AMBULATORY_CARE_PROVIDER_SITE_OTHER): Payer: 59 | Admitting: Certified Nurse Midwife

## 2014-08-18 ENCOUNTER — Telehealth: Payer: Self-pay

## 2014-08-18 VITALS — BP 124/85 | HR 73 | Temp 97.8°F | Wt 247.0 lb

## 2014-08-18 DIAGNOSIS — B3731 Acute candidiasis of vulva and vagina: Secondary | ICD-10-CM

## 2014-08-18 DIAGNOSIS — Z01419 Encounter for gynecological examination (general) (routine) without abnormal findings: Secondary | ICD-10-CM | POA: Diagnosis not present

## 2014-08-18 DIAGNOSIS — B373 Candidiasis of vulva and vagina: Secondary | ICD-10-CM

## 2014-08-18 DIAGNOSIS — N6310 Unspecified lump in the right breast, unspecified quadrant: Secondary | ICD-10-CM

## 2014-08-18 MED ORDER — TERCONAZOLE 0.4 % VA CREA: 1.0000 | TOPICAL_CREAM | Freq: Every day | VAGINAL | Status: DC

## 2014-08-18 MED ORDER — FLUCONAZOLE 100 MG PO TABS: 100.0000 mg | ORAL_TABLET | Freq: Once | ORAL | Status: DC

## 2014-08-18 NOTE — Progress Notes (Signed)
Patient ID: Tina Riggs, female   DOB: 05-09-1980, 34 y.o.   MRN: [id number]    Subjective:        Tina Riggs is a 34 y.o. female here for a routine exam.  Current complaints: desires to have Nexplanon out.  Works full-time is currently out on disability with colostomy.   Smokes roughly a half a pack a day for about 15 years.  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    Personal health questionnaire:  Is patient Ashkenazi Jewish, have a family history of breast and/or ovarian cancer: no Is there a family history of uterine cancer diagnosed at age < 50, gastrointestinal cancer, urinary tract cancer, family member who is a Field seismologist syndrome-associated carrier: no Is the patient overweight and hypertensive, family history of diabetes, personal history of gestational diabetes, preeclampsia or PCOS: yes Is patient over 42, have PCOS,  family history of premature CHD under age 31, diabetes, smoke, have hypertension or peripheral artery disease:  yes At any time, has a partner hit, kicked or otherwise hurt or frightened you?: no Over the past 2 weeks, have you felt down, depressed or hopeless?: no Over the past 2 weeks, have you felt little interest or pleasure in doing things?:no   Gynecologic History Patient's last menstrual period was 07/20/2014. Contraception: Nexplanon Last Pap: unknown. Results were: patient had past hx of HPV Last mammogram: N/A.   Obstetric History OB History  Gravida Para Term Preterm AB SAB TAB Ectopic Multiple Living  1 1 1       1     # Outcome Date GA Lbr Len/2nd Weight Sex Delivery Anes PTL Lv  1 Term               Past Medical History  Diagnosis Date  . HPV in female   . Diverticulitis     hospitalized 04/13/2014; hospitalized 04/29/2014    Past Surgical History  Procedure Laterality Date  . Cesarean section  02/2006  . Colposcopy  Sep 2012  . Laparoscopic lysis of  adhesions N/A 05/18/2014    Procedure: LAPAROSCOPIC MOBILIZATION OF SPLENIC FLEXURE;  Surgeon: Georganna Skeans, MD;  Location: [place];  Service: General;  Laterality: N/A;  . Colostomy revision N/A 05/18/2014    Procedure: COLON RESECTION SIGMOID;  Surgeon: Georganna Skeans, MD;  Location: [place];  Service: General;  Laterality: N/A;  . Colostomy N/A 05/18/2014    Procedure: COLOSTOMY;  Surgeon: Georganna Skeans, MD;  [place]  Service: General;  Laterality: N/A;     Current outpatient prescriptions:  .  etonogestrel (NEXPLANON) 68 MG IMPL implant, 1 each by Subdermal route once. 2013, Disp: , Rfl:  .  oxyCODONE (OXY IR/ROXICODONE) 5 MG immediate release tablet, Take 1-3 tablets (5-15 mg total) by mouth every 4 (four) hours as needed for moderate pain or severe pain., Disp: 60 tablet, Rfl: 0 .  fluconazole (DIFLUCAN) 100 MG tablet, Take 1 tablet (100 mg total) by mouth once. Repeat dose in 48-72 hour., Disp: 3 tablet, Rfl: 0 .  ibuprofen (ADVIL,MOTRIN) 200 MG tablet, Take 600 mg by mouth every 6 (six) hours as needed for mild pain., Disp: , Rfl:  .  methocarbamol (ROBAXIN) 500 MG tablet, Take 1 tablet (500 mg total) by mouth every 6 (six) hours as needed for muscle spasms. (Patient not taking: Reported on 08/18/2014), Disp: 30 tablet, Rfl: 0 .  naproxen sodium (ANAPROX) 220 MG tablet, Take 440 mg  by mouth daily as needed (pain)., Disp: , Rfl:  .  nicotine (NICODERM CQ - DOSED IN MG/24 HOURS) 21 mg/24hr patch, Place 1 patch (21 mg total) onto the skin daily. (Patient not taking: Reported on 08/18/2014), Disp: 28 patch, Rfl: 0 .  terconazole (TERAZOL 7) 0.4 % vaginal cream, Place 1 applicator vaginally at bedtime., Disp: 45 g, Rfl: 0 Allergies  Allergen Reactions  . Penicillins Itching and Rash    Social History  Substance Use Topics  . Smoking status: Current Every Day Smoker -- 0.10 packs/day for 15 years    Types: Cigarettes  . Smokeless tobacco: Never Used  . Alcohol Use: 0.0 oz/week    0  Standard drinks or equivalent per week     Comment: 04/13/2014 "8 beer daily or more"; 04/29/2014 "down to none now"    History reviewed. No pertinent family history.    Review of Systems  Constitutional: negative for fatigue and weight loss Respiratory: negative for cough and wheezing Cardiovascular: negative for chest pain, fatigue and palpitations Gastrointestinal: + colostomy Musculoskeletal:negative for myalgias Neurological: negative for gait problems and tremors Behavioral/Psych: negative for abusive relationship, depression Endocrine: negative for temperature intolerance   Genitourinary:negative for abnormal menstrual periods, genital lesions, hot flashes, sexual problems and vaginal discharge Integument/breast: negative for breast lump, breast tenderness, nipple discharge and skin lesion(s)    Objective:       BP 124/85 mmHg  Pulse 73  Temp(Src) 97.8 F (36.6 C)  Wt 247 lb (112.038 kg)  LMP 07/20/2014 General:   alert  Skin:   no rash or abnormalities  Lungs:   clear to auscultation bilaterally  Heart:   regular rate and rhythm, S1, S2 normal, no murmur, click, rub or gallop  Breasts:   right breast enlarged glands, one area on nipple an areolar gland is enlarged with discharge.    Abdomen:  normal findings: no organomegaly, soft, non-tender and no hernia Obese.  +colostomy  Pelvis:  External genitalia: normal general appearance Urinary system: urethral meatus normal and bladder without fullness, nontender Vaginal: normal without tenderness, induration or masses.  + white thick cottage cheese like discharge, no odor.  Cervix: normal appearance Adnexa: normal bimanual exam Uterus: anteverted and non-tender, normal size   Lab Review Urine pregnancy test Labs reviewed yes Radiologic studies reviewed no  50% of 30 min visit spent on counseling and coordination of care.   Assessment:    Healthy female exam.   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 Contraception: Nexplanon. Follow up in: 1 year.   Meds ordered this encounter  Medications  . fluconazole (DIFLUCAN) 100 MG tablet    Sig: Take 1 tablet (100 mg total) by mouth once. Repeat dose in 48-72 hour.    Dispense:  3 tablet    Refill:  0  . terconazole (TERAZOL 7) 0.4 % vaginal cream    Sig: Place 1 applicator vaginally at bedtime.    Dispense:  45 g    Refill:  0   Orders Placed This Encounter  Procedures  . SureSwab, Vaginosis/Vaginitis Plus  . US BREAST LTD UNI RIGHT INC AXILLA    RT NIPPLE ABSCESS/no hx of br ca/no implants Pf none/no needs/ins-uhc/np/barb with epic order/cosign req    Standing Status: Future     Number of  Occurrences:      Standing Expiration Date: 10/18/2015    Order Specific Question:  Reason for Exam (SYMPTOM  OR DIAGNOSIS REQUIRED)    Answer:  right nipple abcess, enlarged glands    Order Specific Question:  Preferred imaging location?    Answer:  [facility]

## 2014-08-18 NOTE — Telephone Encounter (Signed)
patient scheduled for appt at [facility] 08/20/14 at 3:20pm

## 2014-08-20 ENCOUNTER — Other Ambulatory Visit: Payer: 59

## 2014-08-21 LAB — PAP, TP IMAGING W/ HPV RNA, RFLX HPV TYPE 16,18/45: HPV mRNA, High Risk: DETECTED — AB

## 2014-08-21 LAB — SURESWAB, VAGINOSIS/VAGINITIS PLUS
Atopobium vaginae: NOT DETECTED Log (cells/mL)
BV CATEGORY: UNDETERMINED — AB
C. albicans, DNA: NOT DETECTED
C. glabrata, DNA: NOT DETECTED
C. parapsilosis, DNA: NOT DETECTED
C. trachomatis RNA, TMA: NOT DETECTED
C. tropicalis, DNA: NOT DETECTED
Gardnerella vaginalis: >8 Log (cells/mL)
LACTOBACILLUS SPECIES: 7 Log (cells/mL)
MEGASPHAERA SPECIES: 4.9 Log (cells/mL)
N. gonorrhoeae RNA, TMA: NOT DETECTED
T. vaginalis RNA, QL TMA: NOT DETECTED

## 2014-08-21 LAB — HPV TYPE 16 AND 18/45 RNA
HPV Type 16 RNA: NOT DETECTED
HPV Type 18/45 RNA: NOT DETECTED

## 2014-08-23 ENCOUNTER — Other Ambulatory Visit: Payer: Self-pay | Admitting: Certified Nurse Midwife

## 2014-08-23 DIAGNOSIS — B9689 Other specified bacterial agents as the cause of diseases classified elsewhere: Secondary | ICD-10-CM

## 2014-08-23 DIAGNOSIS — N760 Acute vaginitis: Principal | ICD-10-CM

## 2014-08-23 MED ORDER — METRONIDAZOLE 500 MG PO TABS: 500.0000 mg | ORAL_TABLET | Freq: Two times a day (BID) | ORAL | Status: DC

## 2014-08-26 ENCOUNTER — Telehealth: Payer: Self-pay | Admitting: *Deleted

## 2014-08-26 NOTE — Telephone Encounter (Signed)
Patient calling about her medications- look at labs.

## 2014-10-08 ENCOUNTER — Encounter (HOSPITAL_COMMUNITY): Payer: Self-pay | Admitting: Emergency Medicine

## 2014-10-08 DIAGNOSIS — R1030 Lower abdominal pain, unspecified: Secondary | ICD-10-CM | POA: Insufficient documentation

## 2014-10-08 DIAGNOSIS — N898 Other specified noninflammatory disorders of vagina: Secondary | ICD-10-CM | POA: Diagnosis not present

## 2014-10-08 DIAGNOSIS — Z8719 Personal history of other diseases of the digestive system: Secondary | ICD-10-CM | POA: Insufficient documentation

## 2014-10-08 DIAGNOSIS — R110 Nausea: Secondary | ICD-10-CM | POA: Diagnosis not present

## 2014-10-08 DIAGNOSIS — Z8619 Personal history of other infectious and parasitic diseases: Secondary | ICD-10-CM | POA: Diagnosis not present

## 2014-10-08 DIAGNOSIS — Z880 Allergy status to penicillin: Secondary | ICD-10-CM | POA: Diagnosis not present

## 2014-10-08 DIAGNOSIS — R000 Tachycardia, unspecified: Secondary | ICD-10-CM | POA: Diagnosis not present

## 2014-10-08 DIAGNOSIS — Z720 Tobacco use: Secondary | ICD-10-CM | POA: Diagnosis not present

## 2014-10-08 DIAGNOSIS — Z3202 Encounter for pregnancy test, result negative: Secondary | ICD-10-CM | POA: Insufficient documentation

## 2014-10-08 DIAGNOSIS — Z793 Long term (current) use of hormonal contraceptives: Secondary | ICD-10-CM | POA: Insufficient documentation

## 2014-10-08 NOTE — ED Notes (Signed)
Pt. reports low abdominal pain with nausea for 3 days  , denies emesis . No diarrhea or fever .

## 2014-10-09 ENCOUNTER — Emergency Department (HOSPITAL_COMMUNITY)
Admission: EM | Admit: 2014-10-09 | Discharge: 2014-10-09 | Disposition: A | Discharge status: Home / Self Care | Payer: 59 | Attending: Emergency Medicine | Admitting: Emergency Medicine

## 2014-10-09 DIAGNOSIS — R1030 Lower abdominal pain, unspecified: Secondary | ICD-10-CM

## 2014-10-09 LAB — URINALYSIS, ROUTINE W REFLEX MICROSCOPIC
Bilirubin Urine: NEGATIVE
GLUCOSE, UA: NEGATIVE mg/dL
Ketones, ur: NEGATIVE mg/dL
Leukocytes, UA: NEGATIVE
Nitrite: NEGATIVE
Protein, ur: NEGATIVE mg/dL
SPECIFIC GRAVITY, URINE: 1.004 — AB (ref 1.005–1.030)
Urobilinogen, UA: 0.2 mg/dL (ref 0.0–1.0)
pH: 5.5 (ref 5.0–8.0)

## 2014-10-09 LAB — WET PREP, GENITAL
Trich, Wet Prep: NONE SEEN
Yeast Wet Prep HPF POC: NONE SEEN

## 2014-10-09 LAB — COMPREHENSIVE METABOLIC PANEL
ALBUMIN: 3.7 g/dL (ref 3.5–5.0)
ALK PHOS: 95 U/L (ref 38–126)
ALT: 17 U/L (ref 14–54)
ANION GAP: 12 (ref 5–15)
AST: 21 U/L (ref 15–41)
BUN: 7 mg/dL (ref 6–20)
CO2: 20 mmol/L — AB (ref 22–32)
Calcium: 8.5 mg/dL — ABNORMAL LOW (ref 8.9–10.3)
Chloride: 103 mmol/L (ref 101–111)
Creatinine, Ser: 0.78 mg/dL (ref 0.44–1.00)
GFR calc Af Amer: >60 mL/min (ref >60)
GFR calc non Af Amer: >60 mL/min (ref >60)
GLUCOSE: 106 mg/dL — AB (ref 65–99)
POTASSIUM: 3.6 mmol/L (ref 3.5–5.1)
SODIUM: 135 mmol/L (ref 135–145)
Total Bilirubin: 0.3 mg/dL (ref 0.3–1.2)
Total Protein: 7.4 g/dL (ref 6.5–8.1)

## 2014-10-09 LAB — CBC
HEMATOCRIT: 41.3 % (ref 36.0–46.0)
HEMOGLOBIN: 14.1 g/dL (ref 12.0–15.0)
MCH: 31.7 pg (ref 26.0–34.0)
MCHC: 34.1 g/dL (ref 30.0–36.0)
MCV: 92.8 fL (ref 78.0–100.0)
Platelets: 333 10*3/uL (ref 150–400)
RBC: 4.45 MIL/uL (ref 3.87–5.11)
RDW: 14.1 % (ref 11.5–15.5)
WBC: 9.1 10*3/uL (ref 4.0–10.5)

## 2014-10-09 LAB — POC URINE PREG, ED: PREG TEST UR: NEGATIVE

## 2014-10-09 LAB — URINE MICROSCOPIC-ADD ON

## 2014-10-09 LAB — LIPASE, BLOOD: Lipase: 19 U/L — ABNORMAL LOW (ref 22–51)

## 2014-10-09 LAB — GC/CHLAMYDIA PROBE AMP (~~LOC~~) NOT AT ARMC
Chlamydia: NEGATIVE
NEISSERIA GONORRHEA: NEGATIVE

## 2014-10-09 MED ORDER — ONDANSETRON 4 MG PO TBDP
4.0000 mg | ORAL_TABLET | Freq: Once | ORAL | Status: AC
  Administered 2014-10-09: 4 mg via ORAL
  Filled 2014-10-09: qty 1

## 2014-10-09 MED ORDER — IBUPROFEN 600 MG PO TABS: 600.0000 mg | ORAL_TABLET | Freq: Four times a day (QID) | ORAL | Status: DC | PRN

## 2014-10-09 MED ORDER — ONDANSETRON 8 MG PO TBDP: 8.0000 mg | ORAL_TABLET | Freq: Three times a day (TID) | ORAL | Status: DC | PRN

## 2014-10-09 MED ORDER — IBUPROFEN 200 MG PO TABS
600.0000 mg | ORAL_TABLET | Freq: Once | ORAL | Status: AC
  Administered 2014-10-09: 600 mg via ORAL
  Filled 2014-10-09: qty 3

## 2014-10-09 NOTE — Discharge Instructions (Signed)

## 2014-10-09 NOTE — ED Notes (Signed)
MD at bedside. 

## 2014-10-09 NOTE — ED Provider Notes (Signed)
CSN: [id number]     Arrival date & time 10/08/14  2336 History   By signing my name below, I, Tina Riggs, attest that this documentation has been prepared under the direction and in the presence of Varney Biles, MD. Electronically Signed: Forrestine Riggs, ED Scribe. 10/09/2014. 12:45 AM.   Chief Complaint  Patient presents with  . Abdominal Pain   The history is provided by the patient. No language interpreter was used.    HPI Comments: Tina Riggs is a 34 y.o. female with a PMHx of diverticulitis who presents to the Emergency Department complaining of intermittent, ongoing suprapubic abdominal pain x 3 days. Pain is described as sharp. Associated nausea and mild vaginal discharge also reported. No aggravating or alleviating factors at this time. No OTC medications or home remedies attempted prior to arrival. Denies any recent fever, chills, vomiting, chest pain, or shortness of breath. Tina Riggs denies having to change out her colostomy bags more frequently since time of onset but admits to more watery contents within the bags. LNMP last week. Pt was recently admits to the hospital in April of this year for diverticulitis. As a result, she underwent surgery for colostomy placement. At time of visit, pt was diagnosed with chlamydia. She states current pain is not similar to pain she experienced with diverticulitis. Pt with known allergy to Penicillins.  Past Medical History  Diagnosis Date  . HPV in female   . Diverticulitis     hospitalized 04/13/2014; hospitalized 04/29/2014   Past Surgical History  Procedure Laterality Date  . Cesarean section  02/2006  . Colposcopy  Sep 2012  . Laparoscopic lysis of adhesions N/A 05/18/2014    Procedure: LAPAROSCOPIC MOBILIZATION OF SPLENIC FLEXURE;  Surgeon: Georganna Skeans, MD;  Location: [place];  Service: General;  Laterality: N/A;  . Colostomy revision N/A 05/18/2014    Procedure: COLON RESECTION SIGMOID;  Surgeon: Georganna Skeans, MD;  Location: [place];  Service: General;  Laterality: N/A;  . Colostomy N/A 05/18/2014    Procedure: COLOSTOMY;  Surgeon: Georganna Skeans, MD;  Location: [place];  Service: General;  Laterality: N/A;   No family history on file. Social History  Substance Use Topics  . Smoking status: Current Every Day Smoker -- 0.10 packs/day for 0 years    Types: Cigarettes  . Smokeless tobacco: Never Used  . Alcohol Use: 0.0 oz/week    0 Standard drinks or equivalent per week   OB History    Gravida Para Term Preterm AB TAB SAB Ectopic Multiple Living   1 1 1       1      Review of Systems  Constitutional: Negative for fever and chills.  Respiratory: Negative for cough and shortness of breath.   Cardiovascular: Negative for chest pain.  Gastrointestinal: Positive for nausea and abdominal pain. Negative for vomiting.  Genitourinary: Positive for vaginal discharge. Negative for dysuria.  Skin: Negative for rash.  Neurological: Negative for headaches.  Psychiatric/Behavioral: Negative for confusion.  All other systems reviewed and are negative.    ROS 10 Systems reviewed and are negative for acute change except as noted in the HPI.      Allergies  Penicillins  Home Medications   Prior to Admission medications   Medication Sig Start Date End Date Taking? Authorizing Provider  etonogestrel (NEXPLANON) 68 MG IMPL implant 1 each by Subdermal route once. 2013   Yes Historical Provider, MD  fluconazole (DIFLUCAN) 100 MG tablet Take 1 tablet (100 mg total) by  mouth once. Repeat dose in 48-72 hour. Patient not taking: Reported on 10/09/2014 08/18/14   Rachelle A Denney, CNM  ibuprofen (ADVIL,MOTRIN) 600 MG tablet Take 1 tablet (600 mg total) by mouth every 6 (six) hours as needed. 10/09/14   Varney Biles, MD  methocarbamol (ROBAXIN) 500 MG tablet Take 1 tablet (500 mg total) by mouth every 6 (six) hours as needed for muscle spasms. Patient not taking: Reported on 08/18/2014 05/25/14   Saverio Danker, PA-C   metroNIDAZOLE (FLAGYL) 500 MG tablet Take 1 tablet (500 mg total) by mouth 2 (two) times daily. Patient not taking: Reported on 10/09/2014 08/23/14   Rachelle A Denney, CNM  nicotine (NICODERM CQ - DOSED IN MG/24 HOURS) 21 mg/24hr patch Place 1 patch (21 mg total) onto the skin daily. Patient not taking: Reported on 08/18/2014 04/21/14   Juluis Mire, MD  ondansetron (ZOFRAN ODT) 8 MG disintegrating tablet Take 1 tablet (8 mg total) by mouth every 8 (eight) hours as needed for nausea. 10/09/14   Varney Biles, MD  oxyCODONE (OXY IR/ROXICODONE) 5 MG immediate release tablet Take 1-3 tablets (5-15 mg total) by mouth every 4 (four) hours as needed for moderate pain or severe pain. Patient not taking: Reported on 10/09/2014 05/25/14   Saverio Danker, PA-C  terconazole (TERAZOL 7) 0.4 % vaginal cream Place 1 applicator vaginally at bedtime. Patient not taking: Reported on 10/09/2014 08/18/14   Morene Crocker, CNM   Triage Vitals: BP 138/86 mmHg  Pulse 101  Temp(Src) 98.2 F (36.8 C) (Oral)  Resp 20  SpO2 100%  LMP 10/01/2014 (Approximate)   Physical Exam  Constitutional: She is oriented to person, place, and time. She appears well-developed and well-nourished. No distress.  HENT:  Head: Normocephalic and atraumatic.  Eyes: Conjunctivae and EOM are normal. Pupils are equal, round, and reactive to light.  Neck: Normal range of motion. Neck supple.  Cardiovascular: Regular rhythm, normal heart sounds and intact distal pulses.  Tachycardia present.   No murmur heard. Pulmonary/Chest: Effort normal and breath sounds normal. No respiratory distress. She has no wheezes.  Lungs clear to auscultation    Abdominal: Soft. Bowel sounds are normal. She exhibits no distension. There is tenderness. There is no rebound and no guarding.  Positive bowel sounds noted Tenderness to lower quadrants without any peritoneal signs  Genitourinary: Vagina normal and uterus normal.  External exam - normal, no  lesions Speculum exam: Pt has some white discharge, no blood Bimanual exam: Patient has no CMT, no adnexal tenderness or fullness and cervical os is closed  Musculoskeletal: Normal range of motion.  Neurological: She is alert and oriented to person, place, and time.  Skin: Skin is warm and dry.  Psychiatric: She has a normal mood and affect. Judgment normal.  Nursing note and vitals reviewed.   ED Course  Procedures (including critical care time)  DIAGNOSTIC STUDIES: Oxygen Saturation is 97% on RA, Normal by my interpretation.    COORDINATION OF CARE: 12:29 AM- Will order Lipase, CMP, CBC, urinalysis, and urine pregnancy. Discussed treatment plan with pt at bedside and pt agreed to plan.   @1 :20: pt has to leave as her baby sitter needs to be relieved. She has asked me to call in the prescriptions if she has abnormalities on the wet prep.   Labs Review Labs Reviewed  LIPASE, BLOOD - Abnormal; Notable for the following:    Lipase 19 (*)    All other components within normal limits  COMPREHENSIVE METABOLIC PANEL - Abnormal; Notable  for the following:    CO2 20 (*)    Glucose, Bld 106 (*)    Calcium 8.5 (*)    All other components within normal limits  URINALYSIS, ROUTINE W REFLEX MICROSCOPIC (NOT AT [facility]) - Abnormal; Notable for the following:    Specific Gravity, Urine 1.004 (*)    Hgb urine dipstick SMALL (*)    All other components within normal limits  WET PREP, GENITAL  CBC  URINE MICROSCOPIC-ADD ON  POC URINE PREG, ED  GC/CHLAMYDIA PROBE AMP ([place]) NOT AT [facility]    Imaging Review No results found. I have personally reviewed and evaluated these images and lab results as part of my medical decision-making.   EKG Interpretation None      [phone] MDM   Final diagnoses:  Lower abdominal pain    I personally performed the services described in this documentation, which was scribed in my presence. The recorded information has been reviewed and is  accurate.  Pt comes in with lower quadrant abd discomfort x 3 days. She has hx of diverticulitis s/p colon resection and ostomy bacg placement. She also has hx of chlamydia but denies any risk factors for STD currently.  On exam she has non peritoneal abd tenderness. Pelvic exam is benign as well. Labs and vital are reassuring. I dont suspect SBO, diverticulitis, perforation, uti based on the results and assessment. No indication for pelvic exam.  Stable for d/c.  Varney Biles, MD 10/09/14 [phone]

## 2014-10-12 ENCOUNTER — Telehealth (HOSPITAL_BASED_OUTPATIENT_CLINIC_OR_DEPARTMENT_OTHER): Payer: Self-pay | Admitting: Emergency Medicine

## 2014-10-13 ENCOUNTER — Other Ambulatory Visit: Payer: Self-pay | Admitting: General Surgery

## 2014-10-13 DIAGNOSIS — Z933 Colostomy status: Secondary | ICD-10-CM

## 2014-10-14 ENCOUNTER — Other Ambulatory Visit: Payer: Self-pay | Admitting: General Surgery

## 2014-10-14 DIAGNOSIS — Z933 Colostomy status: Secondary | ICD-10-CM

## 2014-10-17 ENCOUNTER — Emergency Department (HOSPITAL_COMMUNITY): Payer: 59

## 2014-10-17 ENCOUNTER — Inpatient Hospital Stay (HOSPITAL_COMMUNITY)
Admission: EM | Admit: 2014-10-17 | Discharge: 2014-10-19 | DRG: 392 | Disposition: A | Discharge status: Home / Self Care | Payer: 59 | Attending: General Surgery | Admitting: General Surgery

## 2014-10-17 ENCOUNTER — Encounter (HOSPITAL_COMMUNITY): Payer: Self-pay | Admitting: Vascular Surgery

## 2014-10-17 DIAGNOSIS — K5732 Diverticulitis of large intestine without perforation or abscess without bleeding: Principal | ICD-10-CM | POA: Diagnosis present

## 2014-10-17 DIAGNOSIS — F17210 Nicotine dependence, cigarettes, uncomplicated: Secondary | ICD-10-CM | POA: Diagnosis present

## 2014-10-17 DIAGNOSIS — Z9049 Acquired absence of other specified parts of digestive tract: Secondary | ICD-10-CM

## 2014-10-17 DIAGNOSIS — Z880 Allergy status to penicillin: Secondary | ICD-10-CM

## 2014-10-17 DIAGNOSIS — Z933 Colostomy status: Secondary | ICD-10-CM

## 2014-10-17 LAB — CBC
HEMATOCRIT: 39.3 % (ref 36.0–46.0)
HEMOGLOBIN: 13.1 g/dL (ref 12.0–15.0)
MCH: 31.3 pg (ref 26.0–34.0)
MCHC: 33.3 g/dL (ref 30.0–36.0)
MCV: 93.8 fL (ref 78.0–100.0)
Platelets: 279 10*3/uL (ref 150–400)
RBC: 4.19 MIL/uL (ref 3.87–5.11)
RDW: 14.3 % (ref 11.5–15.5)
WBC: 10.7 10*3/uL — ABNORMAL HIGH (ref 4.0–10.5)

## 2014-10-17 LAB — COMPREHENSIVE METABOLIC PANEL
ALBUMIN: 3.5 g/dL (ref 3.5–5.0)
ALT: 21 U/L (ref 14–54)
ANION GAP: 10 (ref 5–15)
AST: 24 U/L (ref 15–41)
Alkaline Phosphatase: 102 U/L (ref 38–126)
BILIRUBIN TOTAL: 0.4 mg/dL (ref 0.3–1.2)
BUN: 6 mg/dL (ref 6–20)
CHLORIDE: 101 mmol/L (ref 101–111)
CO2: 24 mmol/L (ref 22–32)
Calcium: 9 mg/dL (ref 8.9–10.3)
Creatinine, Ser: 0.76 mg/dL (ref 0.44–1.00)
GFR calc Af Amer: >60 mL/min (ref >60)
GFR calc non Af Amer: >60 mL/min (ref >60)
GLUCOSE: 86 mg/dL (ref 65–99)
POTASSIUM: 3.7 mmol/L (ref 3.5–5.1)
SODIUM: 135 mmol/L (ref 135–145)
TOTAL PROTEIN: 7.1 g/dL (ref 6.5–8.1)

## 2014-10-17 LAB — URINALYSIS, ROUTINE W REFLEX MICROSCOPIC
Glucose, UA: NEGATIVE mg/dL
Ketones, ur: 15 mg/dL — AB
Leukocytes, UA: NEGATIVE
Nitrite: NEGATIVE
Protein, ur: NEGATIVE mg/dL
SPECIFIC GRAVITY, URINE: 1.026 (ref 1.005–1.030)
UROBILINOGEN UA: 1 mg/dL (ref 0.0–1.0)
pH: 6 (ref 5.0–8.0)

## 2014-10-17 LAB — URINE MICROSCOPIC-ADD ON

## 2014-10-17 LAB — PREGNANCY, URINE: Preg Test, Ur: NEGATIVE

## 2014-10-17 MED ORDER — MORPHINE SULFATE (PF) 4 MG/ML IV SOLN
4.0000 mg | Freq: Once | INTRAVENOUS | Status: AC
  Administered 2014-10-17: 4 mg via INTRAVENOUS
  Filled 2014-10-17: qty 1

## 2014-10-17 MED ORDER — SODIUM CHLORIDE 0.9 % IV BOLUS (SEPSIS)
500.0000 mL | Freq: Once | INTRAVENOUS | Status: AC
  Administered 2014-10-17: 500 mL via INTRAVENOUS

## 2014-10-17 MED ORDER — IOHEXOL 300 MG/ML  SOLN
100.0000 mL | Freq: Once | INTRAMUSCULAR | Status: AC | PRN
  Administered 2014-10-17: 100 mL via INTRAVENOUS

## 2014-10-17 MED ORDER — ONDANSETRON HCL 4 MG/2ML IJ SOLN
4.0000 mg | Freq: Once | INTRAMUSCULAR | Status: AC
  Administered 2014-10-17: 4 mg via INTRAVENOUS
  Filled 2014-10-17: qty 2

## 2014-10-17 NOTE — ED Notes (Signed)
Pt off unit with CT 

## 2014-10-17 NOTE — ED Provider Notes (Signed)
CSN: [id number]     Arrival date & time 10/17/14  2036 History   First MD Initiated Contact with Patient 10/17/14 2103     Chief Complaint  Patient presents with  . Abdominal Pain     (Consider location/radiation/quality/duration/timing/severity/associated sxs/prior Treatment) HPI Comments: Patient presents to the emergency room for evaluation of abdominal pain. Patient reports that she is experiencing pain across her lower abdomen that radiates up towards the middle of the abdomen. This is accompanied by nausea but no vomiting. She has not noticed any fever. Patient reports that it hurts when she walks and when she drives to jostling in the car causes the pain to worsen. She has a history of diverticulitis requiring colon resection and colostomy previously this year. Colostomy still in place. Patient reports that "something came out of her rectum" yesterday. She is not sure what it was.  Patient is a 34 y.o. female presenting with abdominal pain.  Abdominal Pain Associated symptoms: nausea     Past Medical History  Diagnosis Date  . HPV in female   . Diverticulitis     hospitalized 04/13/2014; hospitalized 04/29/2014   Past Surgical History  Procedure Laterality Date  . Cesarean section  02/2006  . Colposcopy  Sep 2012  . Laparoscopic lysis of adhesions N/A 05/18/2014    Procedure: LAPAROSCOPIC MOBILIZATION OF SPLENIC FLEXURE;  Surgeon: Georganna Skeans, MD;  Location: [place];  Service: General;  Laterality: N/A;  . Colostomy revision N/A 05/18/2014    Procedure: COLON RESECTION SIGMOID;  Surgeon: Georganna Skeans, MD;  Location: [place];  Service: General;  Laterality: N/A;  . Colostomy N/A 05/18/2014    Procedure: COLOSTOMY;  Surgeon: Georganna Skeans, MD;  Location: [place];  Service: General;  Laterality: N/A;   No family history on file. Social History  Substance Use Topics  . Smoking status: Current Every Day Smoker -- 0.10 packs/day for 0 years    Types: Cigarettes  . Smokeless  tobacco: Never Used  . Alcohol Use: 0.0 oz/week    0 Standard drinks or equivalent per week   OB History    Gravida Para Term Preterm AB TAB SAB Ectopic Multiple Living   1 1 1       1      Review of Systems  Gastrointestinal: Positive for nausea and abdominal pain.  All other systems reviewed and are negative.     Allergies  Penicillins  Home Medications   Prior to Admission medications   Medication Sig Start Date End Date Taking? Authorizing Provider  etonogestrel (NEXPLANON) 68 MG IMPL implant 1 each by Subdermal route once. 2013   Yes Historical Provider, MD  tiZANidine (ZANAFLEX) 2 MG tablet Take 2 mg by mouth every 6 (six) hours as needed for muscle spasms.   Yes Historical Provider, MD  traMADol (ULTRAM) 50 MG tablet Take 50 mg by mouth every 6 (six) hours as needed for moderate pain.   Yes Historical Provider, MD   BP 124/62 mmHg  Pulse 85  Temp(Src) 98.5 F (36.9 C) (Oral)  Resp 16  SpO2 100%  LMP 10/01/2014 (Approximate) Physical Exam  Constitutional: She is oriented to person, place, and time. She appears well-developed and well-nourished. No distress.  HENT:  Head: Normocephalic and atraumatic.  Right Ear: Hearing normal.  Left Ear: Hearing normal.  Nose: Nose normal.  Mouth/Throat: Oropharynx is clear and moist and mucous membranes are normal.  Eyes: Conjunctivae and EOM are normal. Pupils are equal, round, and reactive to light.  Neck:  Normal range of motion. Neck supple.  Cardiovascular: Regular rhythm, S1 normal and S2 normal.  Exam reveals no gallop and no friction rub.   No murmur heard. Pulmonary/Chest: Effort normal and breath sounds normal. No respiratory distress. She exhibits no tenderness.  Abdominal: Soft. Normal appearance and bowel sounds are normal. There is no hepatosplenomegaly. There is tenderness in the right lower quadrant, periumbilical area, suprapubic area and left lower quadrant. There is no rebound, no guarding, no tenderness at  McBurney's point and negative Murphy's sign. No hernia.  Musculoskeletal: Normal range of motion.  Neurological: She is alert and oriented to person, place, and time. She has normal strength. No cranial nerve deficit or sensory deficit. Coordination normal. GCS eye subscore is 4. GCS verbal subscore is 5. GCS motor subscore is 6.  Skin: Skin is warm, dry and intact. No rash noted. No cyanosis.  Psychiatric: She has a normal mood and affect. Her speech is normal and behavior is normal. Thought content normal.  Nursing note and vitals reviewed.   ED Course  Procedures (including critical care time) Labs Review Labs Reviewed  CBC - Abnormal; Notable for the following:    WBC 10.7 (*)    All other components within normal limits  URINALYSIS, ROUTINE W REFLEX MICROSCOPIC (NOT AT [facility]) - Abnormal; Notable for the following:    Color, Urine AMBER (*)    APPearance CLOUDY (*)    Hgb urine dipstick MODERATE (*)    Bilirubin Urine SMALL (*)    Ketones, ur 15 (*)    All other components within normal limits  URINE MICROSCOPIC-ADD ON - Abnormal; Notable for the following:    Squamous Epithelial / LPF MANY (*)    All other components within normal limits  COMPREHENSIVE METABOLIC PANEL  PREGNANCY, URINE    Imaging Review Ct Abdomen Pelvis W Contrast  10/17/2014  CLINICAL DATA:  Abdominal pain and nausea for 1 day. Postop from colostomy 4 months ago for diverticulitis. EXAM: CT ABDOMEN AND PELVIS WITH CONTRAST TECHNIQUE: Multidetector CT imaging of the abdomen and pelvis was performed using the standard protocol following bolus administration of intravenous contrast. CONTRAST:  158mL OMNIPAQUE IOHEXOL 300 MG/ML  SOLN COMPARISON:  04/27/2014 FINDINGS: Lower chest:  No acute findings. Hepatobiliary: No masses or other significant abnormality. Pancreas: No mass, inflammatory changes, or other significant abnormality. Spleen: Within normal limits in size and appearance. Adrenals/Urinary Tract: No masses  identified. No evidence of hydronephrosis. 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 Mild pericolonic inflammatory changes are seen in the central pelvis surrounding surgical staples at the proximal end of the rectal pouch. This may be due to colitis or diverticulitis involving the rectal pouch staple line. No evidence of abscess or free fluid. No evidence of bowel obstruction. Normal appendix visualized. Vascular/Lymphatic: No pathologically enlarged lymph nodes. No evidence of abdominal aortic aneurysm. Reproductive: A subserosal fibroid is seen in the left uterine fundus measuring 3.5 cm which is stable. A 3 cm benign-appearing left ovarian cysts is also stable. Other: None. Musculoskeletal:  No suspicious bone lesions identified. IMPRESSION: Mild pericolonic inflammatory changes surrounding the rectal pouch surgical staples in the central pelvis, which may be due to mild colitis or diverticulitis. No evidence of abscess or other complication. Stable left uterine fibroid and benign-appearing left ovarian cyst. Electronically Signed   By: Earle Gell M.D.   On: 10/17/2014 23:41   I have personally reviewed and evaluated these images and lab results as part of my medical decision-making.  EKG Interpretation None      MDM   Final diagnoses:  Diverticulitis of large intestine without perforation or abscess without bleeding    Patient with significant diverticulitis history, recent sigmoid colectomy with colostomy, returns with lower abdominal pain. CT concerning for diverticulitis in rectal stump. General surgery consulted, will admit patient.    Orpah Greek, MD 10/18/14 [phone]

## 2014-10-17 NOTE — ED Notes (Signed)
Pt reports to the ED for eval of lower abd pain that radiates into her mid abdomen. Also reports abd bloating and swelling. She was admitted in May for diverticulitis and she had to have surgery and a colostomy placed. She denies any N/V. She also reports she had something come from her rectum the other day but she is unsure what it was. Pt A&Ox4, resp e/u, and skin warm and dry.

## 2014-10-18 DIAGNOSIS — Z880 Allergy status to penicillin: Secondary | ICD-10-CM | POA: Diagnosis not present

## 2014-10-18 DIAGNOSIS — K5732 Diverticulitis of large intestine without perforation or abscess without bleeding: Secondary | ICD-10-CM | POA: Diagnosis present

## 2014-10-18 DIAGNOSIS — Z933 Colostomy status: Secondary | ICD-10-CM | POA: Diagnosis not present

## 2014-10-18 DIAGNOSIS — Z9049 Acquired absence of other specified parts of digestive tract: Secondary | ICD-10-CM | POA: Diagnosis not present

## 2014-10-18 DIAGNOSIS — F17210 Nicotine dependence, cigarettes, uncomplicated: Secondary | ICD-10-CM | POA: Diagnosis present

## 2014-10-18 MED ORDER — HYDROMORPHONE HCL 1 MG/ML IJ SOLN
0.5000 mg | INTRAMUSCULAR | Status: DC | PRN
  Administered 2014-10-18 (×2): 1 mg via INTRAVENOUS
  Filled 2014-10-18 (×2): qty 1

## 2014-10-18 MED ORDER — CIPROFLOXACIN IN D5W 400 MG/200ML IV SOLN
400.0000 mg | Freq: Two times a day (BID) | INTRAVENOUS | Status: DC
  Filled 2014-10-18: qty 200

## 2014-10-18 MED ORDER — ACETAMINOPHEN 500 MG PO TABS
1000.0000 mg | ORAL_TABLET | Freq: Three times a day (TID) | ORAL | Status: DC
  Administered 2014-10-18 – 2014-10-19 (×4): 1000 mg via ORAL
  Filled 2014-10-18 (×3): qty 2

## 2014-10-18 MED ORDER — BISACODYL 10 MG RE SUPP
10.0000 mg | Freq: Once | RECTAL | Status: AC
  Administered 2014-10-18: 10 mg via RECTAL
  Filled 2014-10-18: qty 1

## 2014-10-18 MED ORDER — TRAMADOL HCL 50 MG PO TABS: 50.0000 mg | ORAL_TABLET | Freq: Four times a day (QID) | ORAL | Status: DC | PRN

## 2014-10-18 MED ORDER — HYDROMORPHONE HCL 1 MG/ML IJ SOLN
0.5000 mg | INTRAMUSCULAR | Status: DC | PRN
  Administered 2014-10-18: 2 mg via INTRAVENOUS
  Administered 2014-10-18: 1 mg via INTRAVENOUS
  Administered 2014-10-19 (×2): 2 mg via INTRAVENOUS
  Filled 2014-10-18 (×4): qty 2
  Filled 2014-10-18: qty 1

## 2014-10-18 MED ORDER — PHENOL 1.4 % MT LIQD: 2.0000 | OROMUCOSAL | Status: DC | PRN

## 2014-10-18 MED ORDER — LIP MEDEX EX OINT: 1.0000 "application " | TOPICAL_OINTMENT | Freq: Two times a day (BID) | CUTANEOUS | Status: DC

## 2014-10-18 MED ORDER — ALUM & MAG HYDROXIDE-SIMETH 200-200-20 MG/5ML PO SUSP: 30.0000 mL | Freq: Four times a day (QID) | ORAL | Status: DC | PRN

## 2014-10-18 MED ORDER — ONDANSETRON 4 MG PO TBDP: 4.0000 mg | ORAL_TABLET | Freq: Four times a day (QID) | ORAL | Status: DC | PRN

## 2014-10-18 MED ORDER — ENOXAPARIN SODIUM 40 MG/0.4ML ~~LOC~~ SOLN
40.0000 mg | Freq: Every day | SUBCUTANEOUS | Status: DC
  Administered 2014-10-18 – 2014-10-19 (×2): 40 mg via SUBCUTANEOUS
  Filled 2014-10-18 (×2): qty 0.4

## 2014-10-18 MED ORDER — PANTOPRAZOLE SODIUM 40 MG IV SOLR
40.0000 mg | Freq: Every day | INTRAVENOUS | Status: DC
  Administered 2014-10-18: 40 mg via INTRAVENOUS
  Filled 2014-10-18: qty 40

## 2014-10-18 MED ORDER — TIZANIDINE HCL 2 MG PO TABS
2.0000 mg | ORAL_TABLET | Freq: Four times a day (QID) | ORAL | Status: DC | PRN
  Administered 2014-10-18: 2 mg via ORAL
  Filled 2014-10-18 (×2): qty 1

## 2014-10-18 MED ORDER — OXYCODONE HCL 5 MG PO TABS
5.0000 mg | ORAL_TABLET | ORAL | Status: DC | PRN
  Administered 2014-10-18 – 2014-10-19 (×3): 10 mg via ORAL
  Filled 2014-10-18 (×4): qty 2

## 2014-10-18 MED ORDER — HYDROMORPHONE HCL 1 MG/ML IJ SOLN: 0.5000 mg | INTRAMUSCULAR | Status: DC | PRN

## 2014-10-18 MED ORDER — MAGIC MOUTHWASH: 15.0000 mL | Freq: Four times a day (QID) | ORAL | Status: DC | PRN

## 2014-10-18 MED ORDER — ONDANSETRON HCL 4 MG/2ML IJ SOLN
4.0000 mg | Freq: Four times a day (QID) | INTRAMUSCULAR | Status: DC | PRN
  Administered 2014-10-18 – 2014-10-19 (×3): 4 mg via INTRAVENOUS
  Filled 2014-10-18 (×3): qty 2

## 2014-10-18 MED ORDER — KCL IN DEXTROSE-NACL 20-5-0.45 MEQ/L-%-% IV SOLN
INTRAVENOUS | Status: DC
  Administered 2014-10-18 (×2): via INTRAVENOUS
  Filled 2014-10-18 (×2): qty 1000

## 2014-10-18 MED ORDER — DIPHENHYDRAMINE HCL 50 MG/ML IJ SOLN
25.0000 mg | Freq: Four times a day (QID) | INTRAMUSCULAR | Status: DC | PRN
  Administered 2014-10-18 (×2): 25 mg via INTRAVENOUS
  Filled 2014-10-18 (×2): qty 1

## 2014-10-18 MED ORDER — NICOTINE 14 MG/24HR TD PT24
14.0000 mg | MEDICATED_PATCH | Freq: Every day | TRANSDERMAL | Status: DC
  Administered 2014-10-18 – 2014-10-19 (×3): 14 mg via TRANSDERMAL
  Filled 2014-10-18 (×3): qty 1

## 2014-10-18 MED ORDER — ERTAPENEM SODIUM 1 G IJ SOLR
1.0000 g | Freq: Every day | INTRAMUSCULAR | Status: DC
  Administered 2014-10-18 (×2): 1 g via INTRAVENOUS
  Filled 2014-10-18 (×3): qty 1

## 2014-10-18 MED ORDER — BLISTEX MEDICATED EX OINT
TOPICAL_OINTMENT | Freq: Two times a day (BID) | CUTANEOUS | Status: DC
  Administered 2014-10-18 – 2014-10-19 (×3): via TOPICAL
  Filled 2014-10-18: qty 10

## 2014-10-18 MED ORDER — DIPHENHYDRAMINE HCL 25 MG PO CAPS: 25.0000 mg | ORAL_CAPSULE | Freq: Four times a day (QID) | ORAL | Status: DC | PRN

## 2014-10-18 MED ORDER — MENTHOL 3 MG MT LOZG: 1.0000 | LOZENGE | OROMUCOSAL | Status: DC | PRN

## 2014-10-18 MED ORDER — LACTATED RINGERS IV BOLUS (SEPSIS): 1000.0000 mL | Freq: Three times a day (TID) | INTRAVENOUS | Status: DC | PRN

## 2014-10-18 MED ORDER — PANTOPRAZOLE SODIUM 40 MG PO TBEC
40.0000 mg | DELAYED_RELEASE_TABLET | Freq: Every day | ORAL | Status: DC
  Administered 2014-10-18 – 2014-10-19 (×2): 40 mg via ORAL
  Filled 2014-10-18 (×2): qty 1

## 2014-10-18 MED ORDER — METRONIDAZOLE IN NACL 5-0.79 MG/ML-% IV SOLN
500.0000 mg | Freq: Once | INTRAVENOUS | Status: AC
  Administered 2014-10-18: 500 mg via INTRAVENOUS
  Filled 2014-10-18: qty 100

## 2014-10-18 NOTE — Discharge Instructions (Signed)
Ostomy Support Information  Yes, Tina Riggs heard that people get along just fine with only one of their eyes, or one of their lungs, or one of their kidneys. 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  Well, you are wrong! There are nearly three quarters of a million people in the [place] who have an ostomy; people who have had surgery to remove all or part of their colons or bladders. There is even a national association, the [place] [employer] [place] with over 350 local affiliated support groups that are organized by volunteers who provide peer support and counseling. Tina Riggs has a toll free telephone num-ber, ([phone] and an educational,  interactive website, www.ostomy.org   An ostomy is an opening in the belly (abdominal wall) made by surgery. Ostomates are people who have had this procedure. The opening (stoma) allows the kidney or bowel to discharge waste. An external pouch covers the stoma to collect waste. Pouches are are a simple bag and are odor free. Different companies have disposable or reusable pouches to fit one's lifestyle. An ostomy can either be temporary or permanent.  THERE ARE THREE MAIN TYPES OF OSTOMIES  Colostomy. A colostomy is a surgically created opening in the large intestine (colon).  Ileostomy. An ileostomy is a surgically created opening in the small intestine.  Urostomy. A urostomy is a surgically created opening to divert urine away from the bladder. FREQUENTLY ASKED QUESTIONS   Why havent you met any of these folks who have an ostomy?  Well, maybe you have! You just did not recognize them because an ostomy doesn't show. It can be kept secret if you wish. Why, maybe some of your best friends, office associates  or neighbors have an ostomy ... you never can tell.   People facing ostomy surgery have many quality-of-life questions like:  Will you bulge? Smell? Make noises? Will you feel waste leaving your body? Will you be a captive of the toilet? Will you starve? Be a social outcast? Get/stay married? Have babies? Easily bathe, go swimming, bend over?  OK, lets look at what you can expect:  Will you bulge?  Remember, without part of the intestine or bladder, and its contents, you should have a flatter tummy than before. You can expect to wear, with little exception, what you wore before surgery ... and this in-cludes tight clothing and bathing suits.  Will you smell?  Today, thanks to modern odor proof pouching systems, you can walk into an ostomy support group meeting and not smell anything that is foul or offensive. And, for those with an ileostomy or colostomy who are concerned about odor when emptying their pouch, there are in-pouch deodorants that can be used to eliminate any waste odors that may exist.  Will you make noises?  Everyone produces gas, especially if they are an air-swallower. But intestinal sounds that occur from time to time are no differ-ent than a gurgling tummy, and quite often your clothing will muffle any sounds.   Will you feel the waste discharges?  For those with a colostomy or ileostomy there might be a slight pressure when waste leaves your body, but understand that the intestines have no nerve endings, so there will be no unpleasant sensations. Those with a urostomy will  probably be unaware of any kidney drainage.  Will you be a captive of the toilet?  Immediately post-op you will spend more time in the bathroom than you will after your body recovers from surgery. Every person is different, but on average those with an ileostomy or urostomy may empty their pouches 4 to 6 times a day; a little  less if you have a colostomy. The average wear time between pouch system changes is 3  to 5 days and the changing process should take less than 30 minutes.  Will I need to be on a special diet? Most people return to their normal diet when they have recovered from surgery. Be sure to chew your food well, eat a well-balanced diet and drink plenty of fluids. If you experience problems with a certain food, wait a couple of weeks and try it again. Will there be odor and noises? Pouching systems are designed to be odor-proof or odor-resistant. There are deodorants that can be used in the pouch. Medications are also available to help reduce odor. Limit gas-producing foods and carbonated beverages. You will experience less gas and fewer noises as you heal from surgery. How much time will it take to care for my ostomy? At first, you may spend a lot of time learning about your ostomy and how to take care of it. As you become more comfortable and skilled at changing the pouching system, it will take very little time to care for it.  Will I be able to return to work? People with ostomies can perform most jobs. As soon as you have healed from surgery, you should be able to return to work. Heavy lifting (more than 10 pounds) may be discouraged.  What about intimacy? Sexual relationships and intimacy are important and fulfilling aspects of your life. They should continue after ostomy surgery. Intimacy-related concerns should be discussed openly between you and your partner.  Can I wear regular clothing? You do not need to wear special clothing. Ostomy pouches are fairly flat and barely noticeable. Elastic undergarments will not hurt the stoma or prevent the ostomy from functioning.  Can I participate in sports? An ostomy should not limit your involvement in sports. Many people with ostomies are runners, skiers, swimmers or participate in other active lifestyles. Talk with your caregiver first before doing heavy physical activity.  Will you starve?  Not if you follow doctors orders at each stage of  your post-op adjustment. There is no such thing as an ostomy diet. Some people with an ostomy will be able to eat and tolerate anything; others may find diffi-culty with some foods. Each person is an individual and must determine, by trial, what is best for them. A good practice for all is to drink plenty of water.  Will you be a social outcast?  Have you met anyone who has an ostomy and is a social outcast? Why should you be the first? Only your attitude and self image will effect how you are treated. No confi-dent person is an Occupational psychologist.   PROFESSIONAL HELP  Resources are available if you need help or have questions about your ostomy.    Specially trained nurses called Wound, Ostomy Continence Nurses (WOCN) are available for consultation in most major medical centers.   Consider getting an ostomy consult with Tina Riggs at [facility] to help troubleshoot stoma pouch fittings and other issues with your ostomy: [phone]   The [place] (UOA) is a group made up of many  local chapters throughout the [place]. These local groups hold meetings and provide support to prospective and existing ostomates. They sponsor educational events and have qualified visitors to make personal or telephone visits. Contact the UOA for the chapter nearest you and for other educational publications.  More detailed information can be found in Colostomy Guide, a publication of the [employer] (UOA). Contact UOA at [phone] or visit their web site at [email or web address] The website contains links to other sites, suppliers and resources. Document Released: 12/22/2002 Document Revised: 03/13/2011 Document Reviewed: 04/22/2008 [facility] Patient Information 2013 [place].  GETTING TO GOOD BOWEL HEALTH. Irregular bowel habits such as constipation and diarrhea can lead to many problems over time.  Having one soft bowel movement a day is the most important way to  prevent further problems.  The anorectal canal is designed to handle stretching and feces to safely manage our ability to get rid of solid waste (feces, poop, stool) out of our body.  BUT, hard constipated stools can act like ripping concrete bricks and diarrhea can be a burning fire to this very sensitive area of our body, causing inflamed hemorrhoids, anal fissures, increasing risk is perirectal abscesses, abdominal pain/bloating, an making irritable bowel worse.      The goal: ONE SOFT BOWEL MOVEMENT A DAY!  To have soft, regular bowel movements:   Drink plenty of fluids, consider 4-6 tall glasses of water a day.    Take plenty of fiber.  Fiber is the undigested part of plant food that passes into the colon, acting s natures broom to encourage bowel motility and movement.  Fiber can absorb and hold large amounts of water. This results in a larger, bulkier stool, which is soft and easier to pass. Work gradually over several weeks up to 6 servings a day of fiber (25g a day even more if needed) in the form of: o Vegetables -- Root (potatoes, carrots, turnips), leafy green (lettuce, salad greens, celery, spinach), or cooked high residue (cabbage, broccoli, etc) o Fruit -- Fresh (unpeeled skin & pulp), Dried (prunes, apricots, cherries, etc ),  or stewed ( applesauce)  o Whole grain breads, pasta, etc (whole wheat)  o Bran cereals   Bulking Agents -- This type of water-retaining fiber generally is easily obtained each day by one of the following:  o Psyllium bran -- The psyllium plant is remarkable because its ground seeds can retain so much water. This product is available as Metamucil, Konsyl, Effersyllium, Per Diem Fiber, or the less expensive generic preparation in drug and health food stores. Although labeled a laxative, it really is not a laxative.  o Methylcellulose -- This is another fiber derived from wood which also retains water. It is available as Citrucel. o Polyethylene Glycol - and  artificial fiber commonly called Miralax or Glycolax.  It is helpful for people with gassy or bloated feelings with regular fiber o Flax Seed - a less gassy fiber than psyllium  No reading or other relaxing activity while on the toilet. If bowel movements take longer than 5 minutes, you are too constipated  AVOID CONSTIPATION.  High fiber and water intake usually takes care of this.  Sometimes a laxative is needed to stimulate more frequent bowel movements, but   Laxatives are not a good long-term solution as it can wear the colon out.  They can help jump-start bowels if constipated, but should be relied on constantly without discussing with your doctor o Osmotics (Milk of Magnesia, Fleets phosphosoda, Magnesium  citrate, MiraLax, GoLytely) are safer than  o Stimulants (Senokot, Castor Oil, Dulcolax, Ex Lax)    o Avoid taking laxatives for more than 7 days in a row.   IF SEVERELY CONSTIPATED, try a Bowel Retraining Program: o Do not use laxatives.  o Eat a diet high in roughage, such as bran cereals and leafy vegetables.  o Drink six (6) ounces of prune or apricot juice each morning.  o Eat two (2) large servings of stewed fruit each day.  o Take one (1) heaping tablespoon of a psyllium-based bulking agent twice a day. Use sugar-free sweetener when possible to avoid excessive calories.  o Eat a normal breakfast.  o Set aside 15 minutes after breakfast to sit on the toilet, but do not strain to have a bowel movement.  o If you do not have a bowel movement by the third day, use an enema and repeat the above steps.   Controlling diarrhea o Switch to liquids and simpler foods for a few days to avoid stressing your intestines further. o Avoid dairy products (especially milk & ice cream) for a short time.  The intestines often can lose the ability to digest lactose when stressed. o Avoid foods that cause gassiness or bloating.  Typical foods include beans and other legumes, cabbage, broccoli, and  dairy foods.  Every person has some sensitivity to other foods, so listen to our body and avoid those foods that trigger problems for you. o Adding fiber (Citrucel, Metamucil, psyllium, Miralax) gradually can help thicken stools by absorbing excess fluid and retrain the intestines to act more normally.  Slowly increase the dose over a few weeks.  Too much fiber too soon can backfire and cause cramping & bloating. o Probiotics (such as active yogurt, Align, etc) may help repopulate the intestines and colon with normal bacteria and calm down a sensitive digestive tract.  Most studies show it to be of mild help, though, and such products can be costly. o Medicines: - Bismuth subsalicylate (ex. Kayopectate, Pepto Bismol) every 30 minutes for up to 6 doses can help control diarrhea.  Avoid if pregnant. - Loperamide (Immodium) can slow down diarrhea.  Start with two tablets (62m total) first and then try one tablet every 6 hours.  Avoid if you are having fevers or severe pain.  If you are not better or start feeling worse, stop all medicines and call your doctor for advice o Call your doctor if you are getting worse or not better.  Sometimes further testing (cultures, endoscopy, X-ray studies, bloodwork, etc) may be needed to help diagnose and treat the cause of the diarrhea.  TROUBLESHOOTING IRREGULAR BOWELS 1) Avoid extremes of bowel movements (no bad constipation/diarrhea) 2) Miralax 17gm mixed in 8oz. water or juice-daily. May use BID as needed.  3) Gas-x,Phazyme, etc. as needed for gas & bloating.  4) Soft,bland diet. No spicy,greasy,fried foods.  5) Prilosec over-the-counter as needed  6) May hold gluten/wheat products from diet to see if symptoms improve.  7)  May try probiotics (Align, Activa, etc) to help calm the bowels down 7) If symptoms become worse call back immediately.   Diverticulitis Diverticulitis is inflammation or infection of small pouches in your colon that form when you have a  condition called diverticulosis. The pouches in your colon are called diverticula. Your colon, or large intestine, is where water is absorbed and stool is formed. Complications of diverticulitis can include:  Bleeding.  Severe infection.  Severe pain.  Perforation of your  colon.  Obstruction of your colon. CAUSES  Diverticulitis is caused by bacteria. Diverticulitis happens when stool becomes trapped in diverticula. This allows bacteria to grow in the diverticula, which can lead to inflammation and infection. RISK FACTORS People with diverticulosis are at risk for diverticulitis. Eating a diet that does not include enough fiber from fruits and vegetables may make diverticulitis more likely to develop. SYMPTOMS  Symptoms of diverticulitis may include:  Abdominal pain and tenderness. The pain is normally located on the left side of the abdomen, but may occur in other areas.  Fever and chills.  Bloating.  Cramping.  Nausea.  Vomiting.  Constipation.  Diarrhea.  Blood in your stool. DIAGNOSIS  Your health care provider will ask you about your medical history and do a physical exam. You may need to have tests done because many medical conditions can cause the same symptoms as diverticulitis. Tests may include:  Blood tests.  Urine tests.  Imaging tests of the abdomen, including X-rays and CT scans. When your condition is under control, your health care provider may recommend that you have a colonoscopy. A colonoscopy can show how severe your diverticula are and whether something else is causing your symptoms. TREATMENT  Most cases of diverticulitis are mild and can be treated at home. Treatment may include:  Taking over-the-counter pain medicines.  Following a clear liquid diet.  Taking antibiotic medicines by mouth for 7-10 days. More severe cases may be treated at a hospital. Treatment may include:  Not eating or drinking.  Taking prescription pain  medicine.  Receiving antibiotic medicines through an IV tube.  Receiving fluids and nutrition through an IV tube.  Surgery. HOME CARE INSTRUCTIONS   Follow your health care provider's instructions carefully.  Follow a full liquid diet or other diet as directed by your health care provider. After your symptoms improve, your health care provider may tell you to change your diet. He or she may recommend you eat a high-fiber diet. Fruits and vegetables are good sources of fiber. Fiber makes it easier to pass stool.  Take fiber supplements or probiotics as directed by your health care provider.  Only take medicines as directed by your health care provider.  Keep all your follow-up appointments. SEEK MEDICAL CARE IF:   Your pain does not improve.  You have a hard time eating food.  Your bowel movements do not return to normal. SEEK IMMEDIATE MEDICAL CARE IF:   Your pain becomes worse.  Your symptoms do not get better.  Your symptoms suddenly get worse.  You have a fever.  You have repeated vomiting.  You have bloody or black, tarry stools. MAKE SURE YOU:   Understand these instructions.  Will watch your condition.  Will get help right away if you are not doing well or get worse.   This information is not intended to replace advice given to you by your health care provider. Make sure you discuss any questions you have with your health care provider.   Document Released: 09/28/2004 Document Revised: 12/24/2012 Document Reviewed: 11/13/2012 Elsevier Interactive Patient Education 2016 Elsevier Inc.   High-Fiber Diet Fiber, also called dietary fiber, is a type of carbohydrate found in fruits, vegetables, whole grains, and beans. A high-fiber diet can have many health benefits. Your health care provider may recommend a high-fiber diet to help:  Prevent constipation. Fiber can make your bowel movements more regular.  Lower your cholesterol.  Relieve hemorrhoids,  uncomplicated diverticulosis, or irritable bowel syndrome.  Prevent overeating  as part of a weight-loss plan.  Prevent heart disease, type 2 diabetes, and certain cancers. WHAT IS MY PLAN? The recommended daily intake of fiber includes:  38 grams for men under age 41.  90 grams for men over age 88.  3 grams for women under age 65.  53 grams for women over age 69. You can get the recommended daily intake of dietary fiber by eating a variety of fruits, vegetables, grains, and beans. Your health care provider may also recommend a fiber supplement if it is not possible to get enough fiber through your diet. WHAT DO I NEED TO KNOW ABOUT A HIGH-FIBER DIET?  Fiber supplements have not been widely studied for their effectiveness, so it is better to get fiber through food sources.  Always check the fiber content on thenutrition facts label of any prepackaged food. Look for foods that contain at least 5 grams of fiber per serving.  Ask your dietitian if you have questions about specific foods that are related to your condition, especially if those foods are not listed in the following section.  Increase your daily fiber consumption gradually. Increasing your intake of dietary fiber too quickly may cause bloating, cramping, or gas.  Drink plenty of water. Water helps you to digest fiber. WHAT FOODS CAN I EAT? Grains Whole-grain breads. Multigrain cereal. Oats and oatmeal. Brown rice. Barley. Bulgur wheat. [place]. Bran muffins. Popcorn. Rye wafer crackers. Vegetables Sweet potatoes. Spinach. [name]. Artichokes. Cabbage. Broccoli. Green peas. Carrots. Squash. Fruits Berries. Pears. Apples. Oranges. Avocados. Prunes and raisins. Dried figs. Meats and Other Protein Sources [place], kidney, pinto, and soy beans. Split peas. Lentils. Nuts and seeds. Dairy Fiber-fortified yogurt. Beverages Fiber-fortified soy milk. Fiber-fortified orange juice. Other Fiber bars. The items listed above may not be  a complete list of recommended foods or beverages. Contact your dietitian for more options. WHAT FOODS ARE NOT RECOMMENDED? Grains White bread. Pasta made with refined flour. White rice. Vegetables Fried potatoes. Canned vegetables. Well-cooked vegetables.  Fruits Fruit juice. Cooked, strained fruit. Meats and Other Protein Sources Fatty cuts of meat. Fried Sales executive or fried fish. Dairy Milk. Yogurt. Cream cheese. Sour cream. Beverages Soft drinks. Other Cakes and pastries. Butter and oils. The items listed above may not be a complete list of foods and beverages to avoid. Contact your dietitian for more information. WHAT ARE SOME TIPS FOR INCLUDING HIGH-FIBER FOODS IN MY DIET?  Eat a wide variety of high-fiber foods.  Make sure that half of all grains consumed each day are whole grains.  Replace breads and cereals made from refined flour or white flour with whole-grain breads and cereals.  Replace white rice with brown rice, bulgur wheat, or millet.  Start the day with a breakfast that is high in fiber, such as a cereal that contains at least 5 grams of fiber per serving.  Use beans in place of meat in soups, salads, or pasta.  Eat high-fiber snacks, such as berries, raw vegetables, nuts, or popcorn.   This information is not intended to replace advice given to you by your health care provider. Make sure you discuss any questions you have with your health care provider.   Document Released: 12/19/2004 Document Revised: 01/09/2014 Document Reviewed: 06/03/2013 Elsevier Interactive Patient Education 2016 [place].   Managing Pain  Pain after surgery or related to activity is often due to strain/injury to muscle, tendon, nerves and/or incisions.  This pain is usually short-term and will improve in a few months.   Many  people find it helpful to do the following things TOGETHER to help speed the process of healing and to get back to regular activity more quickly:  1. Avoid  heavy physical activity at first a. No lifting greater than 20 pounds at first, then increase to lifting as tolerated over the next few weeks b. Do not push through the pain.  Listen to your body and avoid positions and maneuvers than reproduce the pain.  Wait a few days before trying something more intense c. Walking is okay as tolerated, but go slowly and stop when getting sore.  If you can walk 30 minutes without stopping or pain, you can try more intense activity (running, jogging, aerobics, cycling, swimming, treadmill, sex, sports, weightlifting, etc ) d. Remember: If it hurts to do it, then dont do it!  2. Take Anti-inflammatory medication a. Choose ONE of the following over-the-counter medications: i.            Acetaminophen 596m tabs (Tylenol) 1-2 pills with every meal and just before bedtime (avoid if you have liver problems) ii.            Naproxen 2270mtabs (ex. Aleve) 1-2 pills twice a day (avoid if you have kidney, stomach, IBD, or bleeding problems) iii. Ibuprofen 20082mabs (ex. Advil, Motrin) 3-4 pills with every meal and just before bedtime (avoid if you have kidney, stomach, IBD, or bleeding problems) b. Take with food/snack around the clock for 1-2 weeks i. This helps the muscle and nerve tissues become less irritable and calm down faster  3. Use a Heating pad or Ice/Cold Pack a. 4-6 times a day b. May use warm bath/hottub  or showers  4. Try Gentle Massage and/or Stretching  a. at the area of pain many times a day b. stop if you feel pain - do not overdo it  Try these steps together to help you body heal faster and avoid making things get worse.  Doing just one of these things may not be enough.    If you are not getting better after two weeks or are noticing you are getting worse, contact our office for further advice; we may need to re-evaluate you & see what other things we can do to help.

## 2014-10-18 NOTE — Progress Notes (Signed)
Patient ID: Tina Riggs, female   DOB: 16-Jan-1980, 34 y.o.   MRN: [id number]    Subjective: Pt feels a little better today.  Tolerating her regular diet.  She is concerned about what type of pain meds she will be getting.  Objective: Vital signs in last 24 hours: Temp:  [98.5 F (36.9 C)] 98.5 F (36.9 C) (10/16 0137) Pulse Rate:  [60-86] 60 (10/16 0137) Resp:  [16-18] 18 (10/16 0137) BP: (120-161)/(62-105) 161/95 mmHg (10/16 0137) SpO2:  [96 %-100 %] 100 % (10/16 0137) Last BM Date: 10/17/14 (ostomy)  Intake/Output from previous day: 10/15 0701 - 10/16 0700 In: 140 [I.V.:90; IV Piggyback:50] Out: -  Intake/Output this shift:    PE: Abd: soft, minimally tender around ostomy and in her pelvis (lower midline), +BS, ND, obese.  Ostomy with air present Heart: regular LungS: CTAB  Lab Results:   Recent Labs  10/17/14 2103  WBC 10.7*  HGB 13.1  HCT 39.3  PLT 279   BMET  Recent Labs  10/17/14 2103  NA 135  K 3.7  CL 101  CO2 24  GLUCOSE 86  BUN 6  CREATININE 0.76  CALCIUM 9.0   PT/INR No results for input(s): LABPROT, INR in the last 72 hours. CMP     Component Value Date/Time   NA 135 10/17/2014 2103   K 3.7 10/17/2014 2103   CL 101 10/17/2014 2103   CO2 24 10/17/2014 2103   GLUCOSE 86 10/17/2014 2103   BUN 6 10/17/2014 2103   CREATININE 0.76 10/17/2014 2103   CALCIUM 9.0 10/17/2014 2103   PROT 7.1 10/17/2014 2103   ALBUMIN 3.5 10/17/2014 2103   AST 24 10/17/2014 2103   ALT 21 10/17/2014 2103   ALKPHOS 102 10/17/2014 2103   BILITOT 0.4 10/17/2014 2103   GFRNONAA >60 10/17/2014 2103   GFRAA >60 10/17/2014 2103   Lipase     Component Value Date/Time   LIPASE 19* 10/09/2014 0019       Studies/Results: Ct Abdomen Pelvis W Contrast  10/17/2014  CLINICAL DATA:  Abdominal pain and nausea for 1 day. Postop from colostomy 4 months ago for diverticulitis. EXAM: CT ABDOMEN AND PELVIS WITH CONTRAST TECHNIQUE: Multidetector CT imaging of the abdomen  and pelvis was performed using the standard protocol following bolus administration of intravenous contrast. CONTRAST:  154mL OMNIPAQUE IOHEXOL 300 MG/ML  SOLN COMPARISON:  04/27/2014 FINDINGS: Lower chest:  No acute findings. Hepatobiliary: No masses or other significant abnormality. Pancreas: No mass, inflammatory changes, or other significant abnormality. Spleen: Within normal limits in size and appearance. Adrenals/Urinary Tract: No masses identified. No evidence of hydronephrosis. 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 Mild pericolonic inflammatory changes are seen in the central pelvis surrounding surgical staples at the proximal end of the rectal pouch. This may be due to colitis or diverticulitis involving the rectal pouch staple line. No evidence of abscess or free fluid. No evidence of bowel obstruction. Normal appendix visualized. Vascular/Lymphatic: No pathologically enlarged lymph nodes. No evidence of abdominal aortic aneurysm. Reproductive: A subserosal fibroid is seen in the left uterine fundus measuring 3.5 cm which is stable. A 3 cm benign-appearing left ovarian cysts is also stable. Other: None. Musculoskeletal:  No suspicious bone lesions identified. IMPRESSION: Mild pericolonic inflammatory changes surrounding the rectal pouch surgical staples in the central pelvis, which may be due to mild colitis or diverticulitis. No evidence of abscess or other complication. Stable left uterine fibroid and benign-appearing left ovarian cyst. Electronically Signed  By: Earle Gell M.D.   On: 10/17/2014 23:41    Anti-infectives: Anti-infectives    Start     Dose/Rate Route Frequency Ordered Stop   10/18/14 0200  ertapenem (INVANZ) 1 g in sodium chloride 0.9 % 50 mL IVPB     1 g 100 mL/hr over 30 Minutes Intravenous Daily at bedtime 10/18/14 0129     10/18/14 0015  metroNIDAZOLE (FLAGYL) IVPB 500 mg     500 mg 100 mL/hr over 60 Minutes Intravenous  Once 10/18/14 0000 10/18/14 0113    10/18/14 0015  ciprofloxacin (CIPRO) IVPB 400 mg  Status:  Discontinued     400 mg 200 mL/hr over 60 Minutes Intravenous 2 times daily 10/18/14 0002 10/18/14 0129       Assessment/Plan Status post Hartman's procedure for complicated diverticulitis Now with diverticulitis of her remnant sigmoid -cont IV Invanz -cont diet since she is diverted -start oral pain meds -hopefully can go home tomorrow if doing well.    LOS: 0 days    Laverna Dossett E 10/18/2014, 8:37 AM Pager: [phone]

## 2014-10-18 NOTE — Progress Notes (Addendum)
Patient received a rectal dulcolax suppository according to order. Pt passed a fleshy/mucous plug about 3 inches long with a possible 2 inch circumference. RN showed to [employer], Agricultural consultant.  Specimen is currently in a container to show MD if needed. MD notified by Charge RN.  Specimen is currently in container in drawer with chart for MD.

## 2014-10-18 NOTE — Progress Notes (Signed)
Patient refused SCD's. Stated "don't even bring those things in here" before she was even ask about them.  Patient educated on importance and continues to refuse. Jimmie Molly, RN

## 2014-10-18 NOTE — H&P (Signed)
Tina Riggs is an 34 y.o. female.   Chief Complaint: Lower abdominal pain and pressure HPI: Tina Riggs is well known to our emergency general surgery service status post complicated recurrent diverticulitis. She underwent Hartman's procedure 05/18/2014. She has been doing fairly well as an outpatient since then. She is undergoing evaluation by Dr. Leighton Ruff, our colorectal surgery specialist partner for consideration of colostomy takedown. For the past several days she's had increasing pain and pressure in her lower abdomen. She passed a small amount of mucus from her bottom. She has been eating and her ostomy has been functioning well. She came to emergency department for further evaluation due to the pain. CT scan was done demonstrating diverticulitis of her remnant sigmoid colon without abscess. I was asked to see her for admission.  Past Medical History  Diagnosis Date  . HPV in female   . Diverticulitis     hospitalized 04/13/2014; hospitalized 04/29/2014    Past Surgical History  Procedure Laterality Date  . Cesarean section  02/2006  . Colposcopy  Sep 2012  . Laparoscopic lysis of adhesions N/A 05/18/2014    Procedure: LAPAROSCOPIC MOBILIZATION OF SPLENIC FLEXURE;  Surgeon: Georganna Skeans, MD;  Location: [place];  Service: General;  Laterality: N/A;  . Colostomy revision N/A 05/18/2014    Procedure: COLON RESECTION SIGMOID;  Surgeon: Georganna Skeans, MD;  Location: [place];  Service: General;  Laterality: N/A;  . Colostomy N/A 05/18/2014    Procedure: COLOSTOMY;  Surgeon: Georganna Skeans, MD;  Location: [place];  Service: General;  Laterality: N/A;    No family history on file. Social History:  reports that she has been smoking Cigarettes.  She has been smoking about 0.10 packs per day for the past 0 years. She has never used smokeless tobacco. She reports that she drinks alcohol. She reports that she does not use illicit drugs.  Allergies:  Allergies  Allergen Reactions  . Penicillins  Itching and Rash     (Not in a hospital admission)  Results for orders placed or performed during the hospital encounter of 10/17/14 (from the past 48 hour(s))  Urinalysis, Routine w reflex microscopic (not at [facility])     Status: Abnormal   Collection Time: 10/17/14  8:44 PM  Result Value Ref Range   Color, Urine AMBER (A) YELLOW    Comment: BIOCHEMICALS MAY BE AFFECTED BY COLOR   APPearance CLOUDY (A) CLEAR   Specific Gravity, Urine 1.026 1.005 - 1.030   pH 6.0 5.0 - 8.0   Glucose, UA NEGATIVE NEGATIVE mg/dL   Hgb urine dipstick MODERATE (A) NEGATIVE   Bilirubin Urine SMALL (A) NEGATIVE   Ketones, ur 15 (A) NEGATIVE mg/dL   Protein, ur NEGATIVE NEGATIVE mg/dL   Urobilinogen, UA 1.0 0.0 - 1.0 mg/dL   Nitrite NEGATIVE NEGATIVE   Leukocytes, UA NEGATIVE NEGATIVE  Urine microscopic-add on     Status: Abnormal   Collection Time: 10/17/14  8:44 PM  Result Value Ref Range   Squamous Epithelial / LPF MANY (A) RARE   WBC, UA 0-2 <3 WBC/hpf   RBC / HPF 0-2 <3 RBC/hpf   Bacteria, UA RARE RARE  Comprehensive metabolic panel     Status: None   Collection Time: 10/17/14  9:03 PM  Result Value Ref Range   Sodium 135 135 - 145 mmol/L   Potassium 3.7 3.5 - 5.1 mmol/L   Chloride 101 101 - 111 mmol/L   CO2 24 22 - 32 mmol/L   Glucose, Bld 86 65 -  99 mg/dL   BUN 6 6 - 20 mg/dL   Creatinine, Ser 0.76 0.44 - 1.00 mg/dL   Calcium 9.0 8.9 - 10.3 mg/dL   Total Protein 7.1 6.5 - 8.1 g/dL   Albumin 3.5 3.5 - 5.0 g/dL   AST 24 15 - 41 U/L   ALT 21 14 - 54 U/L   Alkaline Phosphatase 102 38 - 126 U/L   Total Bilirubin 0.4 0.3 - 1.2 mg/dL   GFR calc non Af Amer >60 >60 mL/min   GFR calc Af Amer >60 >60 mL/min    Comment: (NOTE) The eGFR has been calculated using the CKD EPI equation. This calculation has not been validated in all clinical situations. eGFR's persistently <60 mL/min signify possible Chronic Kidney Disease.    Anion gap 10 5 - 15  CBC     Status: Abnormal   Collection Time:  10/17/14  9:03 PM  Result Value Ref Range   WBC 10.7 (H) 4.0 - 10.5 K/uL   RBC 4.19 3.87 - 5.11 MIL/uL   Hemoglobin 13.1 12.0 - 15.0 g/dL   HCT 39.3 36.0 - 46.0 %   MCV 93.8 78.0 - 100.0 fL   MCH 31.3 26.0 - 34.0 pg   MCHC 33.3 30.0 - 36.0 g/dL   RDW 14.3 11.5 - 15.5 %   Platelets 279 150 - 400 K/uL  Pregnancy, urine     Status: None   Collection Time: 10/17/14  9:42 PM  Result Value Ref Range   Preg Test, Ur NEGATIVE NEGATIVE    Comment:        THE SENSITIVITY OF THIS METHODOLOGY IS >20 mIU/mL.    Ct Abdomen Pelvis W Contrast  10/17/2014  CLINICAL DATA:  Abdominal pain and nausea for 1 day. Postop from colostomy 4 months ago for diverticulitis. EXAM: CT ABDOMEN AND PELVIS WITH CONTRAST TECHNIQUE: Multidetector CT imaging of the abdomen and pelvis was performed using the standard protocol following bolus administration of intravenous contrast. CONTRAST:  152mL OMNIPAQUE IOHEXOL 300 MG/ML  SOLN COMPARISON:  04/27/2014 FINDINGS: Lower chest:  No acute findings. Hepatobiliary: No masses or other significant abnormality. Pancreas: No mass, inflammatory changes, or other significant abnormality. Spleen: Within normal limits in size and appearance. Adrenals/Urinary Tract: No masses identified. No evidence of hydronephrosis. 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 Mild pericolonic inflammatory changes are seen in the central pelvis surrounding surgical staples at the proximal end of the rectal pouch. This may be due to colitis or diverticulitis involving the rectal pouch staple line. No evidence of abscess or free fluid. No evidence of bowel obstruction. Normal appendix visualized. Vascular/Lymphatic: No pathologically enlarged lymph nodes. No evidence of abdominal aortic aneurysm. Reproductive: A subserosal fibroid is seen in the left uterine fundus measuring 3.5 cm which is stable. A 3 cm benign-appearing left ovarian cysts is also stable. Other: None. Musculoskeletal:  No  suspicious bone lesions identified. IMPRESSION: Mild pericolonic inflammatory changes surrounding the rectal pouch surgical staples in the central pelvis, which may be due to mild colitis or diverticulitis. No evidence of abscess or other complication. Stable left uterine fibroid and benign-appearing left ovarian cyst. Electronically Signed   By: Earle Gell M.D.   On: 10/17/2014 23:41    Review of Systems  Constitutional: Positive for malaise/fatigue. Negative for fever.  HENT: Negative.   Respiratory: Negative for cough and shortness of breath.   Cardiovascular: Negative for chest pain.  Gastrointestinal: Positive for abdominal pain. Negative for nausea, vomiting, constipation and blood  in stool.  Genitourinary: Negative.   Musculoskeletal: Negative.   Skin: Negative.   Endo/Heme/Allergies: Negative.   Psychiatric/Behavioral: The patient is nervous/anxious.     Blood pressure 124/62, pulse 85, temperature 98.5 F (36.9 C), temperature source Oral, resp. rate 16, last menstrual period 10/01/2014, SpO2 100 %. Physical Exam  Constitutional: She is oriented to person, place, and time. She appears well-developed and well-nourished. No distress.  HENT:  Head: Normocephalic and atraumatic.  Right Ear: External ear normal.  Left Ear: External ear normal.  Nose: Nose normal.  Mouth/Throat: Oropharynx is clear and moist.  Eyes: Pupils are equal, round, and reactive to light.  Conjunctiva injected bilaterally  Neck: Neck supple. No tracheal deviation present.  Cardiovascular: Normal rate and normal heart sounds.   Respiratory: Effort normal and breath sounds normal. No stridor. No respiratory distress. She has no wheezes. She has no rales.  GI: Soft. She exhibits no distension. There is tenderness. There is no rebound and no guarding.  Mild lower abdominal and suprapubic tenderness, no generalized tenderness, no peritonitis, mild tenderness along the old incision scar, left lower quadrant  colostomy pink and viable  Musculoskeletal: Normal range of motion.  Neurological: She is alert and oriented to person, place, and time.  Skin: Skin is warm.  Psychiatric: She has a normal mood and affect.     Assessment/Plan Status post Hartman's procedure for complicated diverticulitis Now with diverticulitis of her remnant sigmoid  Admit, IV Invanz (this is what worked for her in the past). Will allow a diet as she is diverted. Hopefully this will improve over a couple days and she can resume outpatient follow-up with Dr. Marcello Moores.  Tyrice Hewitt E 10/18/2014, 12:54 AM

## 2014-10-19 LAB — CBC
HEMATOCRIT: 37.3 % (ref 36.0–46.0)
HEMOGLOBIN: 12.1 g/dL (ref 12.0–15.0)
MCH: 30.7 pg (ref 26.0–34.0)
MCHC: 32.4 g/dL (ref 30.0–36.0)
MCV: 94.7 fL (ref 78.0–100.0)
Platelets: 252 10*3/uL (ref 150–400)
RBC: 3.94 MIL/uL (ref 3.87–5.11)
RDW: 14 % (ref 11.5–15.5)
WBC: 6.5 10*3/uL (ref 4.0–10.5)

## 2014-10-19 MED ORDER — OXYCODONE HCL 5 MG PO TABS: 5.0000 mg | ORAL_TABLET | ORAL | Status: DC | PRN

## 2014-10-19 MED ORDER — CIPROFLOXACIN HCL 500 MG PO TABS: 500.0000 mg | ORAL_TABLET | Freq: Two times a day (BID) | ORAL | Status: DC

## 2014-10-19 MED ORDER — METRONIDAZOLE 500 MG PO TABS: 500.0000 mg | ORAL_TABLET | Freq: Three times a day (TID) | ORAL | Status: DC

## 2014-10-19 NOTE — Discharge Summary (Signed)
Patient ID: Tina Riggs MRN: [id number] DOB/AGE: 1980/04/13 34 y.o.  Admit date: 10/17/2014 Discharge date: 10/19/2014  Procedures: none  Consults: None  Reason for Admission:  Tina Riggs is well known to our emergency general surgery service status post complicated recurrent diverticulitis. She underwent Hartman's procedure 05/18/2014. She has been doing fairly well as an outpatient since then. She is undergoing evaluation by Dr. Leighton Ruff, our colorectal surgery specialist partner for consideration of colostomy takedown. For the past several days she's had increasing pain and pressure in her lower abdomen. She passed a small amount of mucus from her bottom. She has been eating and her ostomy has been functioning well. She came to emergency department for further evaluation due to the pain. CT scan was done demonstrating diverticulitis of her remnant sigmoid colon without abscess. I was asked to see her for admission.  Admission Diagnoses:  Diverticulitis of her remnant sigmoid colon, s/p Hartman's for diverticulitis  Hospital Course: the patient was admitted and placed on IV Invanz.  The following day, her pain was improving.  Her WBC had normalized.  She was tolerating a regular diet since this involved her remnant and not her functioning colon.  She was transitioned to oral pain medication and was felt stable for dc home on Cipro/Flagyl to complete her 7 day total course on HD2.  PE: Abd: soft, minimally tender in lower midline, ostomy working well. +BS Heart: regular Lungs: CTAB  Discharge Diagnoses:  Active Problems:   Diverticulitis large intestine w/o perforation or abscess w/o bleeding   Discharge Medications:   Medication List    TAKE these medications        ciprofloxacin 500 MG tablet  Commonly known as:  CIPRO  Take 1 tablet (500 mg total) by mouth 2 (two) times daily.     etonogestrel 68 MG Impl implant  Commonly known as:  NEXPLANON  1 each by Subdermal route  once. 2013     metroNIDAZOLE 500 MG tablet  Commonly known as:  FLAGYL  Take 1 tablet (500 mg total) by mouth 3 (three) times daily.     oxyCODONE 5 MG immediate release tablet  Commonly known as:  Oxy IR/ROXICODONE  Take 1-3 tablets (5-15 mg total) by mouth every 4 (four) hours as needed for moderate pain.     tiZANidine 2 MG tablet  Commonly known as:  ZANAFLEX  Take 2 mg by mouth every 6 (six) hours as needed for muscle spasms.     traMADol 50 MG tablet  Commonly known as:  ULTRAM  Take 50 mg by mouth every 6 (six) hours as needed for moderate pain.        Discharge Instructions:     Follow-up Information    Follow up with Rosario Adie., MD. Schedule an appointment as soon as possible for a visit in 2 weeks.   Specialty:  General Surgery   Why:   To follow up after your hospital stay   Contact information:   [address] [place] [address] [phone]       Signed: Henreitta Cea 10/19/2014, 11:23 AM

## 2014-10-19 NOTE — Care Management Note (Signed)
Case Management Note  Patient Details  Name: Tina Riggs MRN: [id number] Date of Birth: Jan 07, 1980  Subjective/Objective:  Pt admitted with diverticulitis                  Action/Plan:  Pt is independent from home.  Pt has ostomy placed in May 2016, pt stated she initially had [facility] RN for ostomy care but does not have home health at this time.     Expected Discharge Date:                  Expected Discharge Plan:  Home/Self Care  In-House Referral:     Discharge planning Services  CM Consult  Post Acute Care Choice:    Choice offered to:     DME Arranged:    DME Agency:     HH Arranged:    [place] Agency:     Status of Service:  Completed, signed off  Medicare Important Message Given:    Date Medicare IM Given:    Medicare IM give by:    Date Additional Medicare IM Given:    Additional Medicare Important Message give by:     If discussed at [place] of Stay Meetings, dates discussed:    Additional Comments: CM assessed pt; no CM needs prior to discharge. Maryclare Labrador, RN 10/19/2014, 1:46 PM

## 2014-10-19 NOTE — Progress Notes (Signed)
Patient had an episode of vomiting this morning even after Zofran was given. She has also had no output from her ostomy since admission, although she has been passing gas.

## 2014-10-19 NOTE — Progress Notes (Signed)
Utilization review completed. Kayani Rapaport, RN, BSN. 

## 2014-10-27 ENCOUNTER — Other Ambulatory Visit: Payer: 59

## 2014-11-01 ENCOUNTER — Encounter (HOSPITAL_COMMUNITY): Payer: Self-pay | Admitting: *Deleted

## 2014-11-01 DIAGNOSIS — K921 Melena: Secondary | ICD-10-CM | POA: Insufficient documentation

## 2014-11-01 DIAGNOSIS — Z880 Allergy status to penicillin: Secondary | ICD-10-CM | POA: Diagnosis not present

## 2014-11-01 DIAGNOSIS — Z8619 Personal history of other infectious and parasitic diseases: Secondary | ICD-10-CM | POA: Diagnosis not present

## 2014-11-01 DIAGNOSIS — Z720 Tobacco use: Secondary | ICD-10-CM | POA: Insufficient documentation

## 2014-11-01 DIAGNOSIS — R109 Unspecified abdominal pain: Secondary | ICD-10-CM | POA: Diagnosis present

## 2014-11-01 LAB — CBC
HCT: 39 % (ref 36.0–46.0)
Hemoglobin: 13.4 g/dL (ref 12.0–15.0)
MCH: 32.1 pg (ref 26.0–34.0)
MCHC: 34.4 g/dL (ref 30.0–36.0)
MCV: 93.3 fL (ref 78.0–100.0)
PLATELETS: 316 10*3/uL (ref 150–400)
RBC: 4.18 MIL/uL (ref 3.87–5.11)
RDW: 14.6 % (ref 11.5–15.5)
WBC: 9.7 10*3/uL (ref 4.0–10.5)

## 2014-11-01 LAB — COMPREHENSIVE METABOLIC PANEL
ALBUMIN: 3.1 g/dL — AB (ref 3.5–5.0)
ALT: 15 U/L (ref 14–54)
AST: 20 U/L (ref 15–41)
Alkaline Phosphatase: 78 U/L (ref 38–126)
Anion gap: 6 (ref 5–15)
BUN: 12 mg/dL (ref 6–20)
CHLORIDE: 102 mmol/L (ref 101–111)
CO2: 26 mmol/L (ref 22–32)
CREATININE: 0.93 mg/dL (ref 0.44–1.00)
Calcium: 8.5 mg/dL — ABNORMAL LOW (ref 8.9–10.3)
GFR calc Af Amer: >60 mL/min (ref >60)
GFR calc non Af Amer: >60 mL/min (ref >60)
Glucose, Bld: 103 mg/dL — ABNORMAL HIGH (ref 65–99)
Potassium: 3.6 mmol/L (ref 3.5–5.1)
SODIUM: 134 mmol/L — AB (ref 135–145)
Total Bilirubin: 0.3 mg/dL (ref 0.3–1.2)
Total Protein: 6.3 g/dL — ABNORMAL LOW (ref 6.5–8.1)

## 2014-11-01 NOTE — ED Notes (Signed)
Pt states she has black stool in her colostomy bag and states she was recently admitted for diverticulitis flare to her lower colon.  No pain.

## 2014-11-02 ENCOUNTER — Emergency Department (HOSPITAL_COMMUNITY)
Admission: EM | Admit: 2014-11-02 | Discharge: 2014-11-02 | Disposition: A | Discharge status: Home / Self Care | Payer: 59 | Attending: Emergency Medicine | Admitting: Emergency Medicine

## 2014-11-02 DIAGNOSIS — R195 Other fecal abnormalities: Secondary | ICD-10-CM

## 2014-11-02 LAB — POC OCCULT BLOOD, ED: FECAL OCCULT BLD: NEGATIVE

## 2014-11-02 NOTE — ED Provider Notes (Signed)
By signing my name below, I, Helane Gunther, attest that this documentation has been prepared under the direction and in the presence of [employer], DO. Electronically Signed: Helane Gunther, ED Scribe. 11/02/2014. 12:34 AM.  TIME SEEN: 12:26 AM  CHIEF COMPLAINT: Melena   HPI: Tina Riggs is a 34 y.o. female smoker at 0.1 ppd with a PMHx of diverticulitis who presents to the Emergency Department complaining of melena noticed a few hours ago. She reports associated abdominal discomfort, chills (resolved), and vomiting (resolved, last episode 2 days ago). She states she took [employer] a few days ago. She notes she has a colostomy bag due to a PMHx of diverticulitis as of May of this year. She notes she has just finished a course of antibiotics. She states she no longer has any stool voiding through her rectum. She notes she has an appointment with her PCP tomorrow. Pt denies bright red stool, fever, and loss of appetite. Pt is allergic to penicillins.  ROS: See HPI Constitutional: no fever  Eyes: no drainage  ENT: no runny nose   Cardiovascular:  no chest pain  Resp: no SOB  GI: vomiting GU: no dysuria Integumentary: no rash  Allergy: no hives  Musculoskeletal: no leg swelling  Neurological: no slurred speech ROS otherwise negative  PAST MEDICAL HISTORY/PAST SURGICAL HISTORY:  Past Medical History  Diagnosis Date  . HPV in female   . Diverticulitis     hospitalized 04/13/2014; hospitalized 04/29/2014    MEDICATIONS:  Prior to Admission medications   Medication Sig Start Date End Date Taking? Authorizing Provider  etonogestrel (NEXPLANON) 68 MG IMPL implant 1 each by Subdermal route once. 2013   Yes Historical Provider, MD  tiZANidine (ZANAFLEX) 2 MG tablet Take 2 mg by mouth every 6 (six) hours as needed for muscle spasms.   Yes Historical Provider, MD  traMADol (ULTRAM) 50 MG tablet Take 50 mg by mouth every 6 (six) hours as needed for moderate pain.   Yes Historical  Provider, MD    ALLERGIES:  Allergies  Allergen Reactions  . Penicillins Itching and Rash    SOCIAL HISTORY:  Social History  Substance Use Topics  . Smoking status: Current Every Day Smoker -- 0.10 packs/day for 0 years    Types: Cigarettes  . Smokeless tobacco: Never Used  . Alcohol Use: 0.0 oz/week    0 Standard drinks or equivalent per week    FAMILY HISTORY: No family history on file.  EXAM: BP 146/89 mmHg  Pulse 81  Temp(Src) 98.1 F (36.7 C) (Oral)  Resp 16  Ht 5\' 6"  (1.676 m)  Wt 260 lb 8 oz (118.162 kg)  BMI 42.07 kg/m2  SpO2 99%  LMP 10/01/2014 (Approximate) CONSTITUTIONAL: Alert and oriented and responds appropriately to questions. Well-appearing; well-nourished HEAD: Normocephalic EYES: Conjunctivae clear, PERRL ENT: normal nose; no rhinorrhea; moist mucous membranes; pharynx without lesions noted NECK: Supple, no meningismus, no LAD  CARD: RRR; S1 and S2 appreciated; no murmurs, no clicks, no rubs, no gallops RESP: Normal chest excursion without splinting or tachypnea; breath sounds clear and equal bilaterally; no wheezes, no rhonchi, no rales, no hypoxia or respiratory distress, speaking full sentences ABD/GI: Normal bowel sounds; non-distended; soft, non-tender, no rebound, no guarding, no peritoneal signs; colostomy bag in the LLQ that has soft, dark brown stool without melena that is guaiac negative BACK:  The back appears normal and is non-tender to palpation, there is no CVA tenderness EXT: Normal ROM in all joints; non-tender to  palpation; no edema; normal capillary refill; no cyanosis, no calf tenderness or swelling    SKIN: Normal color for age and race; warm NEURO: Moves all extremities equally, sensation to light touch intact diffusely, cranial nerves II through XII intact PSYCH: The patient's mood and manner are appropriate. Grooming and personal hygiene are appropriate.  MEDICAL DECISION MAKING: Patient here with dark stools after taking  Pepto-Bismol. She is guaiac negative. No hematochezia. No abdominal pain. Hemodynamically stable. No fever. Labs unremarkable. Patient has outpatient follow-up scheduled.  I do not feel there is any life-threatening condition present. Discussed all results, exam findings with patient. I feel the patient is safe to be discharged home without further emergent workup. Discussed usual and customary return precautions. Patient and family (if present) verbalize understanding and are comfortable with this plan.  Patient will follow-up with their primary care provider. If they do not have a primary care provider, information for follow-up has been provided to them. All questions have been answered.   I personally performed the services described in this documentation, which was scribed in my presence. The recorded information has been reviewed and is accurate.   [place], DO 11/02/14 [phone]

## 2014-11-02 NOTE — ED Notes (Signed)
Pt requesting to be unhooked from vital monitoring devices

## 2014-11-02 NOTE — Discharge Instructions (Signed)
You were seen in the emergency department for dog stools. Your stools appear normal and there is no blood. A fingerstick to follow-up with her outpatient doctors as scheduled. If you have fever, vomiting and cannot stop, abdominal pain, bloody or stores begins to look black and tarry, please return to the emergency department.

## 2014-11-02 NOTE — ED Notes (Signed)
Dr. Ward at the bedside.  

## 2014-11-02 NOTE — ED Notes (Signed)
Pt requesting ice chips and informed at this time because of the reason she is here to be seen it is beneficial she not have anything to eat or drink until seen by the doctor.

## 2014-11-03 ENCOUNTER — Ambulatory Visit
Admission: RE | Admit: 2014-11-03 | Discharge: 2014-11-03 | Disposition: A | Discharge status: Home / Self Care | Payer: 59 | Source: Ambulatory Visit | Attending: General Surgery | Admitting: General Surgery

## 2014-11-30 ENCOUNTER — Encounter (HOSPITAL_COMMUNITY): Payer: Self-pay | Admitting: Emergency Medicine

## 2014-11-30 ENCOUNTER — Emergency Department (HOSPITAL_COMMUNITY)
Admission: EM | Admit: 2014-11-30 | Discharge: 2014-12-01 | Disposition: A | Discharge status: Home / Self Care | Payer: 59 | Attending: Emergency Medicine | Admitting: Emergency Medicine

## 2014-11-30 DIAGNOSIS — Z880 Allergy status to penicillin: Secondary | ICD-10-CM | POA: Diagnosis not present

## 2014-11-30 DIAGNOSIS — M722 Plantar fascial fibromatosis: Secondary | ICD-10-CM | POA: Insufficient documentation

## 2014-11-30 DIAGNOSIS — Z8619 Personal history of other infectious and parasitic diseases: Secondary | ICD-10-CM | POA: Diagnosis not present

## 2014-11-30 DIAGNOSIS — F17210 Nicotine dependence, cigarettes, uncomplicated: Secondary | ICD-10-CM | POA: Insufficient documentation

## 2014-11-30 DIAGNOSIS — R109 Unspecified abdominal pain: Secondary | ICD-10-CM | POA: Diagnosis present

## 2014-11-30 DIAGNOSIS — M549 Dorsalgia, unspecified: Secondary | ICD-10-CM | POA: Insufficient documentation

## 2014-11-30 DIAGNOSIS — Z3202 Encounter for pregnancy test, result negative: Secondary | ICD-10-CM | POA: Diagnosis not present

## 2014-11-30 DIAGNOSIS — R1013 Epigastric pain: Secondary | ICD-10-CM | POA: Diagnosis not present

## 2014-11-30 DIAGNOSIS — Z933 Colostomy status: Secondary | ICD-10-CM | POA: Insufficient documentation

## 2014-11-30 DIAGNOSIS — Z8719 Personal history of other diseases of the digestive system: Secondary | ICD-10-CM | POA: Diagnosis not present

## 2014-11-30 LAB — COMPREHENSIVE METABOLIC PANEL
ALBUMIN: 3.6 g/dL (ref 3.5–5.0)
ALK PHOS: 85 U/L (ref 38–126)
ALT: 17 U/L (ref 14–54)
AST: 22 U/L (ref 15–41)
Anion gap: 7 (ref 5–15)
BUN: 11 mg/dL (ref 6–20)
CHLORIDE: 109 mmol/L (ref 101–111)
CO2: 25 mmol/L (ref 22–32)
CREATININE: 0.96 mg/dL (ref 0.44–1.00)
Calcium: 9 mg/dL (ref 8.9–10.3)
GFR calc non Af Amer: >60 mL/min (ref >60)
GLUCOSE: 98 mg/dL (ref 65–99)
Potassium: 4.1 mmol/L (ref 3.5–5.1)
SODIUM: 141 mmol/L (ref 135–145)
Total Bilirubin: 0.4 mg/dL (ref 0.3–1.2)
Total Protein: 7.2 g/dL (ref 6.5–8.1)

## 2014-11-30 LAB — URINALYSIS, ROUTINE W REFLEX MICROSCOPIC
BILIRUBIN URINE: NEGATIVE
GLUCOSE, UA: NEGATIVE mg/dL
Ketones, ur: NEGATIVE mg/dL
Leukocytes, UA: NEGATIVE
Nitrite: NEGATIVE
PROTEIN: NEGATIVE mg/dL
Specific Gravity, Urine: 1.027 (ref 1.005–1.030)
pH: 6 (ref 5.0–8.0)

## 2014-11-30 LAB — URINE MICROSCOPIC-ADD ON

## 2014-11-30 LAB — CBC
HCT: 41.2 % (ref 36.0–46.0)
Hemoglobin: 13.8 g/dL (ref 12.0–15.0)
MCH: 31.5 pg (ref 26.0–34.0)
MCHC: 33.5 g/dL (ref 30.0–36.0)
MCV: 94.1 fL (ref 78.0–100.0)
PLATELETS: 284 10*3/uL (ref 150–400)
RBC: 4.38 MIL/uL (ref 3.87–5.11)
RDW: 14.6 % (ref 11.5–15.5)
WBC: 8.4 10*3/uL (ref 4.0–10.5)

## 2014-11-30 LAB — LIPASE, BLOOD: LIPASE: 22 U/L (ref 11–51)

## 2014-11-30 LAB — I-STAT BETA HCG BLOOD, ED (MC, WL, AP ONLY): I-stat hCG, quantitative: <5 m[IU]/mL (ref <5)
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
## 2014-12-01 MED ORDER — IBUPROFEN 800 MG PO TABS
800.0000 mg | ORAL_TABLET | Freq: Once | ORAL | Status: AC
  Administered 2014-12-01: 800 mg via ORAL
  Filled 2014-12-01: qty 1

## 2014-12-01 MED ORDER — IBUPROFEN 800 MG PO TABS: 800.0000 mg | ORAL_TABLET | Freq: Three times a day (TID) | ORAL | Status: DC | PRN
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
## 2014-12-01 NOTE — Discharge Instructions (Signed)
Plantar Fasciitis Tina Riggs, you need soft insoles in your shoes to help with your foot pain.  Take ibuprofen as needed, ice the area and keep it elevated.  See a primary care doctor within 3 days for close follow up.  If symptoms worsen, come back to the ED immediately.  Thank you. Plantar fasciitis is a painful foot condition that affects the heel. It occurs when the band of tissue that connects the toes to the heel bone (plantar fascia) becomes irritated. This can happen after exercising too much or doing other repetitive activities (overuse injury). The pain from plantar fasciitis can range from mild irritation to severe pain that makes it difficult for you to walk or move. The pain is usually worse in the morning or after you have been sitting or lying down for a while. CAUSES This condition may be caused by:  Standing for long periods of time.  Wearing shoes that do not fit.  Doing high-impact activities, including running, aerobics, and ballet.  Being overweight.  Having an abnormal way of walking (gait).  Having tight calf muscles.  Having high arches in your feet.  Starting a new athletic activity. SYMPTOMS The main symptom of this condition is heel pain. Other symptoms include:  Pain that gets worse after activity or exercise.  Pain that is worse in the morning or after resting.  Pain that goes away after you walk for a few minutes. DIAGNOSIS This condition may be diagnosed based on your signs and symptoms. Your health care provider will also do a physical exam to check for:  A tender area on the bottom of your foot.  A high arch in your foot.  Pain when you move your foot.  Difficulty moving your foot. You may also need to have imaging studies to confirm the diagnosis. These can include:  X-rays.  Ultrasound.  MRI. TREATMENT  Treatment for plantar fasciitis depends on the severity of the condition. Your treatment may include:  Rest, ice, and  over-the-counter pain medicines to manage your pain.  Exercises to stretch your calves and your plantar fascia.  A splint that holds your foot in a stretched, upward position while you sleep (night splint).  Physical therapy to relieve symptoms and prevent problems in the future.  Cortisone injections to relieve severe pain.  Extracorporeal shock wave therapy (ESWT) to stimulate damaged plantar fascia with electrical impulses. It is often used as a last resort before surgery.  Surgery, if other treatments have not worked after 12 months. HOME CARE INSTRUCTIONS  Take medicines only as directed by your health care provider.  Avoid activities that cause pain.  Roll the bottom of your foot over a bag of ice or a bottle of cold water. Do this for 20 minutes, 3-4 times a day.  Perform simple stretches as directed by your health care provider.  Try wearing athletic shoes with air-sole or gel-sole cushions or soft shoe inserts.  Wear a night splint while sleeping, if directed by your health care provider.  Keep all follow-up appointments with your health care provider. PREVENTION   Do not perform exercises or activities that cause heel pain.  Consider finding low-impact activities if you continue to have problems.  Lose weight if you need to. The best way to prevent plantar fasciitis is to avoid the activities that aggravate your plantar fascia. SEEK MEDICAL CARE IF:  Your symptoms do not go away after treatment with home care measures.  Your pain gets worse.  Your pain  affects your ability to move or do your daily activities.   This information is not intended to replace advice given to you by your health care provider. Make sure you discuss any questions you have with your health care provider.   Document Released: 09/13/2000 Document Revised: 09/09/2014 Document Reviewed: 10/29/2013 Elsevier Interactive Patient Education 2016 [employer] Abdominal Pain, Adult Many  things can cause belly (abdominal) pain. Most times, the belly pain is not dangerous. Many cases of belly pain can be watched and treated at home. HOME CARE   Do not take medicines that help you go poop (laxatives) unless told to by your doctor.  Only take medicine as told by your doctor.  Eat or drink as told by your doctor. Your doctor will tell you if you should be on a special diet. GET HELP IF:  You do not know what is causing your belly pain.  You have belly pain while you are sick to your stomach (nauseous) or have runny poop (diarrhea).  You have pain while you pee or poop.  Your belly pain wakes you up at night.  You have belly pain that gets worse or better when you eat.  You have belly pain that gets worse when you eat fatty foods.  You have a fever. GET HELP RIGHT AWAY IF:   The pain does not go away within 2 hours.  You keep throwing up (vomiting).  The pain changes and is only in the right or left part of the belly.  You have bloody or tarry looking poop. MAKE SURE YOU:   Understand these instructions.  Will watch your condition.  Will get help right away if you are not doing well or get worse.   This information is not intended to replace advice given to you by your health care provider. Make sure you discuss any questions you have with your health care provider.   Document Released: 06/07/2007 Document Revised: 01/09/2014 Document Reviewed: 08/28/2012 Elsevier Interactive Patient Education [employer].

## 2015-02-12 ENCOUNTER — Other Ambulatory Visit: Payer: Self-pay | Admitting: General Surgery

## 2015-05-11 ENCOUNTER — Other Ambulatory Visit: Payer: Self-pay | Admitting: General Surgery

## 2015-05-11 NOTE — H&P (Signed)
History of Present Illness Tina Riggs; [id number] 2:29 PM) Patient words: discuss reversal.  The patient is a 35 year old female presenting for a post-operative visit. Status post Hartmann's for diverticulitis in May 2016. She had a lot of ongoing pain issues, but states this is better once she got some teaching on how to pouch her ostomy. She was hospitalized in mid Oct for another episode of diverticulitis in her rectal stump. She was then lost to follow up for a while. She is back today to discuss reversal.    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 Robaxin *MUSCULOSKELETAL THERAPY AGENTS* Swollen lips.  Medication History Tina Riggs; [id number] 3:05 PM) TiZANidine HCl (2MG  Tablet, 1 (one) Tablet Oral 3 times a day as needed, Taken starting 09/02/2014) Active. Methocarbamol (500MG  Tablet, Oral) Active. Advil (200MG  Capsule, Oral) Active. Naproxen Sodium (220MG  Capsule, Oral) Active. [facility] Nicotine (14MG /24HR Patch 24HR, Transdermal) Active. Nexplanon (68MG  Implant, Subcutaneous) Active. Medications Reconciled Neomycin Sulfate (500MG  Tablet, 2 (two) Tablet Oral SEE NOTE, Taken starting 05/11/2015) Active. (TAKE TWO TABLETS AT 2 PM, 3 PM, AND 10 PM THE DAY PRIOR TO SURGERY) Flagyl (500MG  Tablet, 2 (two) Tablet Oral SEE NOTE, Taken starting 05/11/2015) Active. (Take at 2pm, 3pm, and 10pm the day prior to your colon operation)  Social History Tina Riggs; [id number] 3:08 PM) Tobacco use Never smoker.  Note:no tob use, occ etoh     Review of Systems Tina Riggs; [id number] 3:06 PM) General Not Present- Appetite Loss, Chills,  Fatigue, Fever, Night Sweats, Weight Gain and Weight Loss. Skin Not Present- Change in Wart/Mole, Dryness, Hives, Jaundice, New Lesions, Non-Healing Wounds, Rash and Ulcer. HEENT Not Present- Earache, Hearing Loss, Hoarseness, Nose Bleed, Oral Ulcers, Ringing in the Ears, Seasonal Allergies, Sinus Pain, Sore Throat, Visual Disturbances, Wears glasses/contact lenses and Yellow Eyes. Respiratory Not Present- Bloody sputum, Chronic Cough, Difficulty Breathing, Snoring and Wheezing. Breast Not Present- Breast Mass, Breast Pain, Nipple Discharge and Skin Changes. Cardiovascular Not Present- Chest Pain, Difficulty Breathing Lying Down, Leg Cramps, Palpitations, Rapid Heart Rate, Shortness of Breath and Swelling of Extremities. Gastrointestinal Not Present- Abdominal Pain, Bloating, Bloody Stool, Change in Bowel Habits, Chronic diarrhea, Constipation, Difficulty Swallowing, Excessive gas, Gets full quickly at meals, Hemorrhoids, Indigestion, Nausea, Rectal Pain and Vomiting. Female Genitourinary Not Present- Frequency, Nocturia, Painful Urination, Pelvic Pain and Urgency. Musculoskeletal Not Present- Back Pain, Joint Pain, Joint Stiffness, Muscle Pain, Muscle Weakness and Swelling of Extremities. Neurological Not Present- Decreased Memory, Fainting, Headaches, Numbness, Seizures, Tingling, Tremor, Trouble walking and Weakness. Psychiatric Not Present- Anxiety, Bipolar, Change in Sleep Pattern, Depression, Fearful and Frequent crying. Endocrine Not Present- Cold Intolerance, Excessive Hunger, Hair Changes, Heat Intolerance, Hot flashes and New Diabetes. Hematology Not Present- Easy Bruising, Excessive bleeding, Gland problems, HIV and Persistent Infections.  Vitals Tina Billings Dockery LPN; [id number] PM) 05/11/2015 2:16 PM Weight: 270.8 lb Height: 67in Body Surface Area: 2.3 m Body Mass Index: 42.41 kg/m  Temp.: 98.68F(Oral)  Pulse: 86 (Regular)  BP: 128/84 (Sitting, Left Arm,  Standard)      Physical Exam Tina Riggs; [id number] 3:06 PM)  General Mental Status-Alert. General Appearance-Not in acute distress. Build & Nutrition-Well nourished. Posture-Normal posture. Gait-Normal.  Head and Neck Head-normocephalic, atraumatic with no lesions or palpable masses.  Trachea-midline.  Chest and Lung Exam Chest and lung exam reveals -on auscultation, normal breath sounds, no adventitious sounds and normal vocal resonance.  Cardiovascular Cardiovascular examination reveals -normal heart sounds, regular rate and rhythm with no murmurs.  Abdomen Inspection Inspection of the abdomen reveals - No Hernias. Note: LLQ ostomy, lower midline incision. Palpation/Percussion Palpation and Percussion of the abdomen reveal - Soft, Non Tender, No Rigidity (guarding), No hepatosplenomegaly and No Palpable abdominal masses.  Neurologic Neurologic evaluation reveals -alert and oriented x 3 with no impairment of recent or remote memory, normal attention span and ability to concentrate, normal sensation and normal coordination.  Musculoskeletal Normal Exam - Bilateral-Upper Extremity Strength Normal and Lower Extremity Strength Normal.    Assessment & Plan Tina Riggs; [id number] 3:09 PM)  STATUS POST HARTMANN PROCEDURE (Z93.3) Impression: 34yo ready for ostomy reversal. All questions answered. We will plan on doing a colonoscopy prior to her anastomosis in the OR. The surgery and anatomy were described to the patient as well as the risks of surgery and the possible complications. These include: Bleeding, deep abdominal infections and possible wound complications such as hernia and infection, damage to adjacent structures, leak of surgical connections, which can lead to other surgeries and possibly an ostomy, possible need for other procedures, such as abscess drains in radiology, possible prolonged hospital stay, possible diarrhea from removal of  part of the colon, possible constipation from narcotics, possible bowel, bladder or sexual dysfunction if having rectal surgery, prolonged fatigue/weakness or appetite loss, possible early recurrence of of disease, possible complications of their medical problems such as heart disease or arrhythmias or lung problems, death (less than 1%). I believe the patient understands and wishes to proceed with the surgery.

## 2015-06-01 ENCOUNTER — Ambulatory Visit: Payer: 59 | Admitting: Sports Medicine

## 2015-06-03 HISTORY — PX: COLOSTOMY REVERSAL: SHX5782

## 2015-06-08 NOTE — Patient Instructions (Addendum)
FREDERICKA GIESECKE  06/08/2015   Your procedure is scheduled on: 06/11/2015    Report to [facility] Main  Entrance take [place]  elevators to 3rd floor to  [place] at    [place] AM.  Call this number if you have problems the morning of surgery [phone]   Remember: ONLY 1 PERSON MAY GO WITH YOU TO SHORT STAY TO GET  READY MORNING OF [place].  Do not eat food or drink liquids :After Midnight.  Follow Bowel Prep Instructions per MD    Take these medicines the morning of surgery with A SIP OF WATER: none                                 You may not have any metal on your body including hair pins and              piercings  Do not wear jewelry, make-up, lotions, powders or perfumes, deodorant             Do not wear nail polish.  Do not shave  48 hours prior to surgery.     Do not bring valuables to the hospital. National.  Contacts, dentures or bridgework may not be worn into surgery.  Leave suitcase in the car. After surgery it may be brought to your room.       Special Instructions: coughing and deep breathing exercises, leg exercises               Please read over the following fact sheets you were given: _____________________________________________________________________             [facility] - Preparing for Surgery Before surgery, you can play an important role.  Because skin is not sterile, your skin needs to be as free of germs as possible.  You can reduce the number of germs on your skin by washing with CHG (chlorahexidine gluconate) soap before surgery.  CHG is an antiseptic cleaner which kills germs and bonds with the skin to continue killing germs even after washing. Please DO NOT use if you have an allergy to CHG or antibacterial soaps.  If your skin becomes reddened/irritated stop using the CHG and inform your nurse when you arrive at Short Stay. Do not shave (including legs and  underarms) for at least 48 hours prior to the first CHG shower.  You may shave your face/neck. Please follow these instructions carefully:  1.  Shower with CHG Soap the night before surgery and the  morning of Surgery.  2.  If you choose to wash your hair, wash your hair first as usual with your  normal  shampoo.  3.  After you shampoo, rinse your hair and body thoroughly to remove the  shampoo.                           4.  Use CHG as you would any other liquid soap.  You can apply chg directly  to the skin and wash                       Gently with a scrungie or clean washcloth.  5.  Apply the CHG Soap to your body ONLY FROM THE NECK DOWN.   Do not use on face/ open                           Wound or open sores. Avoid contact with eyes, ears mouth and genitals (private parts).                       Wash face,  Genitals (private parts) with your normal soap.             6.  Wash thoroughly, paying special attention to the area where your surgery  will be performed.  7.  Thoroughly rinse your body with warm water from the neck down.  8.  DO NOT shower/wash with your normal soap after using and rinsing off  the CHG Soap.                9.  Pat yourself dry with a clean towel.            10.  Wear clean pajamas.            11.  Place clean sheets on your bed the night of your first shower and do not  sleep with pets. Day of Surgery : Do not apply any lotions/deodorants the morning of surgery.  Please wear clean clothes to the hospital/surgery center.  FAILURE TO FOLLOW THESE INSTRUCTIONS MAY RESULT IN THE CANCELLATION OF YOUR SURGERY PATIENT SIGNATURE_________________________________  NURSE SIGNATURE__________________________________  ________________________________________________________________________  WHAT IS A BLOOD TRANSFUSION? Blood Transfusion Information  A transfusion is the replacement of blood or some of its parts. Blood is made up of multiple cells which provide different  functions.  Red blood cells carry oxygen and are used for blood loss replacement.  White blood cells fight against infection.  Platelets control bleeding.  Plasma helps clot blood.  Other blood products are available for specialized needs, such as hemophilia or other clotting disorders. BEFORE THE TRANSFUSION  Who gives blood for transfusions?   Healthy volunteers who are fully evaluated to make sure their blood is safe. This is blood bank blood. Transfusion therapy is the safest it has ever been in the practice of medicine. Before blood is taken from a donor, a complete history is taken to make sure that person has no history of diseases nor engages in risky social behavior (examples are intravenous drug use or sexual activity with multiple partners). The donor's travel history is screened to minimize risk of transmitting infections, such as malaria. The donated blood is tested for signs of infectious diseases, such as HIV and hepatitis. The blood is then tested to be sure it is compatible with you in order to minimize the chance of a transfusion reaction. If you or a relative donates blood, this is often done in anticipation of surgery and is not appropriate for emergency situations. It takes many days to process the donated blood. RISKS AND COMPLICATIONS Although transfusion therapy is very safe and saves many lives, the main dangers of transfusion include:   Getting an infectious disease.  Developing a transfusion reaction. This is an allergic reaction to something in the blood you were given. Every precaution is taken to prevent this. The decision to have a blood transfusion has been considered carefully by your caregiver before blood is given. Blood is not given unless the benefits outweigh the risks. AFTER THE TRANSFUSION  Right after  receiving a blood transfusion, you will usually feel much better and more energetic. This is especially true if your red blood cells have gotten low  (anemic). The transfusion raises the level of the red blood cells which carry oxygen, and this usually causes an energy increase.  The nurse administering the transfusion will monitor you carefully for complications. HOME CARE INSTRUCTIONS  No special instructions are needed after a transfusion. You may find your energy is better. Speak with your caregiver about any limitations on activity for underlying diseases you may have. SEEK MEDICAL CARE IF:   Your condition is not improving after your transfusion.  You develop redness or irritation at the intravenous (IV) site. SEEK IMMEDIATE MEDICAL CARE IF:  Any of the following symptoms occur over the next 12 hours:  Shaking chills.  You have a temperature by mouth above 102 F (38.9 C), not controlled by medicine.  Chest, back, or muscle pain.  People around you feel you are not acting correctly or are confused.  Shortness of breath or difficulty breathing.  Dizziness and fainting.  You get a rash or develop hives.  You have a decrease in urine output.  Your urine turns a dark color or changes to pink, red, or brown. Any of the following symptoms occur over the next 10 days:  You have a temperature by mouth above 102 F (38.9 C), not controlled by medicine.  Shortness of breath.  Weakness after normal activity.  The white part of the eye turns yellow (jaundice).  You have a decrease in the amount of urine or are urinating less often.  Your urine turns a dark color or changes to pink, red, or brown. Document Released: 12/17/1999 Document Revised: 03/13/2011 Document Reviewed: 08/05/2007 ExitCare Patient Information 2014 ExitCare, [place].  _______________________________________________________________________   CLEAR LIQUID DIET   Foods Allowed                                                                     Foods Excluded  Coffee and tea, regular and decaf                             liquids that you cannot   Plain Jell-O in any flavor                                             see through such as: Fruit ices (not with fruit pulp)                                     milk, soups, orange juice  Iced Popsicles                                    All solid food Carbonated beverages, regular and diet  Cranberry, grape and apple juices Sports drinks like Gatorade Lightly seasoned clear broth or consume(fat free) Sugar, honey syrup  Sample Menu Breakfast                                Lunch                                     Supper Cranberry juice                    Beef broth                            Chicken broth Jell-O                                     Grape juice                           Apple juice Coffee or tea                        Jell-O                                      Popsicle                                                Coffee or tea                        Coffee or tea  _____________________________________________________________________

## 2015-06-09 ENCOUNTER — Encounter (HOSPITAL_COMMUNITY): Payer: Self-pay

## 2015-06-09 ENCOUNTER — Encounter (HOSPITAL_COMMUNITY)
Admission: RE | Admit: 2015-06-09 | Discharge: 2015-06-09 | Disposition: A | Discharge status: Home / Self Care | Payer: Medicaid Other | Source: Ambulatory Visit | Attending: General Surgery | Admitting: General Surgery

## 2015-06-09 DIAGNOSIS — Z01812 Encounter for preprocedural laboratory examination: Secondary | ICD-10-CM | POA: Insufficient documentation

## 2015-06-09 DIAGNOSIS — R9431 Abnormal electrocardiogram [ECG] [EKG]: Secondary | ICD-10-CM | POA: Diagnosis not present

## 2015-06-09 DIAGNOSIS — Z01810 Encounter for preprocedural cardiovascular examination: Secondary | ICD-10-CM | POA: Diagnosis present

## 2015-06-09 DIAGNOSIS — K5792 Diverticulitis of intestine, part unspecified, without perforation or abscess without bleeding: Secondary | ICD-10-CM | POA: Diagnosis not present

## 2015-06-09 LAB — CBC
HEMATOCRIT: 39.2 % (ref 36.0–46.0)
Hemoglobin: 13.4 g/dL (ref 12.0–15.0)
MCH: 31.5 pg (ref 26.0–34.0)
MCHC: 34.2 g/dL (ref 30.0–36.0)
MCV: 92 fL (ref 78.0–100.0)
PLATELETS: 317 10*3/uL (ref 150–400)
RBC: 4.26 MIL/uL (ref 3.87–5.11)
RDW: 14.7 % (ref 11.5–15.5)
WBC: 9.4 10*3/uL (ref 4.0–10.5)

## 2015-06-09 LAB — BASIC METABOLIC PANEL
Anion gap: 7 (ref 5–15)
BUN: 8 mg/dL (ref 6–20)
CHLORIDE: 108 mmol/L (ref 101–111)
CO2: 23 mmol/L (ref 22–32)
CREATININE: 0.6 mg/dL (ref 0.44–1.00)
Calcium: 8.7 mg/dL — ABNORMAL LOW (ref 8.9–10.3)
GFR calc Af Amer: >60 mL/min (ref >60)
GLUCOSE: 87 mg/dL (ref 65–99)
POTASSIUM: 4.1 mmol/L (ref 3.5–5.1)
SODIUM: 138 mmol/L (ref 135–145)

## 2015-06-09 LAB — HCG, SERUM, QUALITATIVE: Preg, Serum: NEGATIVE

## 2015-06-09 LAB — ABO/RH: ABO/RH(D): O POS

## 2015-06-09 NOTE — Progress Notes (Addendum)
Spoke with Abigail Butts at [place] stated she gave the faxed progress note to [name] who is nurse for Dr Leighton Ruff.  Also told Abigail Butts that patient does not have a PCP per patient.  Also patient is a smoker and I informed patient that she should quit smoking.  Abigail Butts ( Triage nurse) will inform Dr Leighton Ruff and [name] of above.

## 2015-06-09 NOTE — Anesthesia Preprocedure Evaluation (Addendum)
Anesthesia Evaluation  Patient identified by MRN, date of birth, ID band Patient awake    Reviewed: Allergy & Precautions, NPO status , Patient's Chart, lab work & pertinent test results  History of Anesthesia Complications Negative for: history of anesthetic complications  Airway Mallampati: II  TM Distance: >3 FB Neck ROM: Full    Dental  (+) Dental Advisory Given, Teeth Intact   Pulmonary Current Smoker,    breath sounds clear to auscultation       Cardiovascular (-) anginanegative cardio ROS   Rhythm:Regular Rate:Normal     Neuro/Psych negative neurological ROS     GI/Hepatic Neg liver ROS, diverticulitis   Endo/Other  Morbid obesityBMI 40  Renal/GU negative Renal ROS     Musculoskeletal negative musculoskeletal ROS (+)   Abdominal (+) + obese,   Peds  Hematology negative hematology ROS (+) 13/39   Anesthesia Other Findings   Reproductive/Obstetrics                          Anesthesia Physical Anesthesia Plan  ASA: II  Anesthesia Plan: General   Post-op Pain Management:    Induction: Intravenous  Airway Management Planned: Oral ETT  Additional Equipment:   Intra-op Plan:   Post-operative Plan: Extubation in OR  Informed Consent: I have reviewed the patients History and Physical, chart, labs and discussed the procedure including the risks, benefits and alternatives for the proposed anesthesia with the patient or authorized representative who has indicated his/her understanding and acceptance.     Plan Discussed with:   Anesthesia Plan Comments: (MAC 3 Grade I view last GA, PG neg)      Anesthesia Quick Evaluation

## 2015-06-09 NOTE — Progress Notes (Addendum)
At preop appointment blood pressure initial in left arm was 156/101.  Recheck after 30 minutes blood pressure was 151/115 in left arm and in right arm was 147/122.  Patient denies any headaches, dizziness or lightheadedness  Patient states " I am ready to get this surgery overwith.  My exboyfriend just got out of jail last nite.  Patient denies being anxious over exboyfriend getting out of jail.  Reported to Dr Tresa Moore ( anesthesia) above and he stated to recheck blood pressure after blood drawn and if blood pressure still [id number] or above diastolic patient needs to go to ER.  Otherwise report findings to surgeon and patient needs to be seen by PCP.  Blood pressure in right arm after labwork obtaines was 158/98.  I told patient she needs to go see PCP regarding Blood pressure prior to surgery and that I was going to notify Dr Leighton Ruff of blood pressure readings.  Dr Tresa Moore ( anesthesia) stated blood pressure needs to be evaluated prior to surgery.  Patient states she has no PCP.  I called office of [facility] and spoke with Sunday Spillers( Triage Nurse) and will fax to [name] ( Dr Leighton Ruff , nurse ) this note.

## 2015-06-09 NOTE — Progress Notes (Signed)
Called and spoke with Cipriano Mile at [place] and told her surgery for patient on 06/11/2015 had been changed to robotic but consent form not changed in EPIC.  She will get in touch with Dr Marcello Moores.

## 2015-06-09 NOTE — Progress Notes (Addendum)
Called office at [place] since patient states she does not have bowel prep instructions .  Spoke with Abigail Butts in triage.  Abigail Butts to fax over bowel prep instructions.  Bowel prep instructions reviewed with patient and given to patient.  Copy on chart.  Patient states she does not want to have to buy anything.  I instructed patient to go by office of [facility] today regarding this and she voiced understanding .  Informed office that patient would be coming by office on 06/09/15 to see if she could pick up preop items from office so she would not have to buy anything.

## 2015-06-10 LAB — HEMOGLOBIN A1C
Hgb A1c MFr Bld: 5.5 % (ref 4.8–5.6)
MEAN PLASMA GLUCOSE: 111 mg/dL

## 2015-06-10 NOTE — Progress Notes (Signed)
Final EKG done 06/09/15- EPIC   

## 2015-06-11 ENCOUNTER — Inpatient Hospital Stay (HOSPITAL_COMMUNITY): Payer: Medicaid Other | Admitting: Anesthesiology

## 2015-06-11 ENCOUNTER — Inpatient Hospital Stay (HOSPITAL_COMMUNITY)
Admission: RE | Admit: 2015-06-11 | Discharge: 2015-06-16 | DRG: 330 | Disposition: A | Discharge status: Home / Self Care | Payer: Medicaid Other | Source: Ambulatory Visit | Attending: General Surgery | Admitting: General Surgery

## 2015-06-11 ENCOUNTER — Encounter (HOSPITAL_COMMUNITY): Payer: Self-pay

## 2015-06-11 ENCOUNTER — Encounter (HOSPITAL_COMMUNITY)
Admission: RE | Disposition: A | Discharge status: Home / Self Care | Payer: Self-pay | Source: Ambulatory Visit | Attending: General Surgery

## 2015-06-11 DIAGNOSIS — K567 Ileus, unspecified: Secondary | ICD-10-CM | POA: Diagnosis not present

## 2015-06-11 DIAGNOSIS — Z880 Allergy status to penicillin: Secondary | ICD-10-CM | POA: Diagnosis not present

## 2015-06-11 DIAGNOSIS — K5790 Diverticulosis of intestine, part unspecified, without perforation or abscess without bleeding: Secondary | ICD-10-CM | POA: Diagnosis present

## 2015-06-11 DIAGNOSIS — Z791 Long term (current) use of non-steroidal anti-inflammatories (NSAID): Secondary | ICD-10-CM | POA: Diagnosis not present

## 2015-06-11 DIAGNOSIS — Z79899 Other long term (current) drug therapy: Secondary | ICD-10-CM

## 2015-06-11 DIAGNOSIS — Z433 Encounter for attention to colostomy: Secondary | ICD-10-CM | POA: Diagnosis present

## 2015-06-11 DIAGNOSIS — K660 Peritoneal adhesions (postprocedural) (postinfection): Secondary | ICD-10-CM | POA: Diagnosis present

## 2015-06-11 DIAGNOSIS — Z888 Allergy status to other drugs, medicaments and biological substances status: Secondary | ICD-10-CM

## 2015-06-11 DIAGNOSIS — K5730 Diverticulosis of large intestine without perforation or abscess without bleeding: Secondary | ICD-10-CM | POA: Diagnosis present

## 2015-06-11 HISTORY — PX: COLONOSCOPY: SHX5424

## 2015-06-11 LAB — TYPE AND SCREEN
ABO/RH(D): O POS
Antibody Screen: NEGATIVE

## 2015-06-11 SURGERY — COLECTOMY, PARTIAL, ROBOT-ASSISTED, LAPAROSCOPIC
Anesthesia: General | Site: Abdomen

## 2015-06-11 MED ORDER — DIPHENHYDRAMINE HCL 25 MG PO CAPS
25.0000 mg | ORAL_CAPSULE | Freq: Four times a day (QID) | ORAL | Status: DC | PRN
  Administered 2015-06-12 – 2015-06-16 (×3): 25 mg via ORAL
  Filled 2015-06-11 (×3): qty 1

## 2015-06-11 MED ORDER — ALUM & MAG HYDROXIDE-SIMETH 200-200-20 MG/5ML PO SUSP: 30.0000 mL | Freq: Four times a day (QID) | ORAL | Status: DC | PRN

## 2015-06-11 MED ORDER — OXYMETAZOLINE HCL 0.05 % NA SOLN
NASAL | Status: DC | PRN
  Administered 2015-06-11: 2 via NASAL

## 2015-06-11 MED ORDER — PROMETHAZINE HCL 25 MG/ML IJ SOLN
INTRAMUSCULAR | Status: AC
  Filled 2015-06-11: qty 1

## 2015-06-11 MED ORDER — ACETAMINOPHEN 500 MG PO TABS
1000.0000 mg | ORAL_TABLET | Freq: Four times a day (QID) | ORAL | Status: AC
  Administered 2015-06-11 – 2015-06-12 (×4): 1000 mg via ORAL
  Filled 2015-06-11 (×4): qty 2

## 2015-06-11 MED ORDER — ONDANSETRON HCL 4 MG/2ML IJ SOLN
INTRAMUSCULAR | Status: AC
  Filled 2015-06-11: qty 2

## 2015-06-11 MED ORDER — PROPOFOL 10 MG/ML IV BOLUS
INTRAVENOUS | Status: AC
  Filled 2015-06-11: qty 20

## 2015-06-11 MED ORDER — ROCURONIUM BROMIDE 50 MG/5ML IV SOLN
INTRAVENOUS | Status: AC
  Filled 2015-06-11: qty 1

## 2015-06-11 MED ORDER — FENTANYL CITRATE (PF) 100 MCG/2ML IJ SOLN
25.0000 ug | INTRAMUSCULAR | Status: DC | PRN
  Administered 2015-06-11 (×2): 50 ug via INTRAVENOUS

## 2015-06-11 MED ORDER — FENTANYL CITRATE (PF) 250 MCG/5ML IJ SOLN
INTRAMUSCULAR | Status: AC
  Filled 2015-06-11: qty 5

## 2015-06-11 MED ORDER — MIDAZOLAM HCL 5 MG/5ML IJ SOLN
INTRAMUSCULAR | Status: DC | PRN
  Administered 2015-06-11: 2 mg via INTRAVENOUS

## 2015-06-11 MED ORDER — HYDRALAZINE HCL 20 MG/ML IJ SOLN
INTRAMUSCULAR | Status: AC
  Filled 2015-06-11: qty 1

## 2015-06-11 MED ORDER — SUGAMMADEX SODIUM 200 MG/2ML IV SOLN
INTRAVENOUS | Status: DC | PRN
  Administered 2015-06-11: 200 mg via INTRAVENOUS

## 2015-06-11 MED ORDER — ENOXAPARIN SODIUM 40 MG/0.4ML ~~LOC~~ SOLN
40.0000 mg | SUBCUTANEOUS | Status: DC
  Administered 2015-06-12 – 2015-06-16 (×5): 40 mg via SUBCUTANEOUS
  Filled 2015-06-11 (×5): qty 0.4

## 2015-06-11 MED ORDER — KCL IN DEXTROSE-NACL 20-5-0.45 MEQ/L-%-% IV SOLN
INTRAVENOUS | Status: DC
  Administered 2015-06-11: 16:00:00 via INTRAVENOUS
  Administered 2015-06-12: 1000 mL via INTRAVENOUS
  Administered 2015-06-12 – 2015-06-14 (×5): via INTRAVENOUS
  Administered 2015-06-14: 100 mL/h via INTRAVENOUS
  Administered 2015-06-14: 16:00:00 via INTRAVENOUS
  Filled 2015-06-11 (×12): qty 1000

## 2015-06-11 MED ORDER — 0.9 % SODIUM CHLORIDE (POUR BTL) OPTIME
TOPICAL | Status: DC | PRN
  Administered 2015-06-11: 2000 mL

## 2015-06-11 MED ORDER — OXYMETAZOLINE HCL 0.05 % NA SOLN
NASAL | Status: AC
  Filled 2015-06-11: qty 15

## 2015-06-11 MED ORDER — ESMOLOL HCL 100 MG/10ML IV SOLN
INTRAVENOUS | Status: AC
  Filled 2015-06-11: qty 10

## 2015-06-11 MED ORDER — ESMOLOL HCL 100 MG/10ML IV SOLN
INTRAVENOUS | Status: DC | PRN
  Administered 2015-06-11: 20 mg via INTRAVENOUS

## 2015-06-11 MED ORDER — ONDANSETRON HCL 4 MG/2ML IJ SOLN
4.0000 mg | Freq: Four times a day (QID) | INTRAMUSCULAR | Status: DC | PRN
  Administered 2015-06-12 – 2015-06-13 (×2): 4 mg via INTRAVENOUS
  Filled 2015-06-11 (×2): qty 2

## 2015-06-11 MED ORDER — CEFOTETAN DISODIUM-DEXTROSE 2-2.08 GM-% IV SOLR
INTRAVENOUS | Status: AC
  Filled 2015-06-11: qty 50

## 2015-06-11 MED ORDER — FENTANYL CITRATE (PF) 100 MCG/2ML IJ SOLN
INTRAMUSCULAR | Status: AC
  Filled 2015-06-11: qty 2

## 2015-06-11 MED ORDER — FENTANYL CITRATE (PF) 100 MCG/2ML IJ SOLN
INTRAMUSCULAR | Status: DC | PRN
  Administered 2015-06-11: 50 ug via INTRAVENOUS
  Administered 2015-06-11 (×4): 100 ug via INTRAVENOUS
  Administered 2015-06-11: 50 ug via INTRAVENOUS

## 2015-06-11 MED ORDER — MEPERIDINE HCL 50 MG/ML IJ SOLN: 6.2500 mg | INTRAMUSCULAR | Status: DC | PRN

## 2015-06-11 MED ORDER — ONDANSETRON HCL 4 MG/2ML IJ SOLN
INTRAMUSCULAR | Status: DC | PRN
  Administered 2015-06-11: 4 mg via INTRAVENOUS

## 2015-06-11 MED ORDER — HYDROMORPHONE HCL 1 MG/ML IJ SOLN
1.0000 mg | INTRAMUSCULAR | Status: DC | PRN
  Administered 2015-06-11 – 2015-06-12 (×5): 2 mg via INTRAVENOUS
  Administered 2015-06-12: 1 mg via INTRAVENOUS
  Administered 2015-06-12 (×2): 2 mg via INTRAVENOUS
  Administered 2015-06-13: 1 mg via INTRAVENOUS
  Administered 2015-06-13: 2 mg via INTRAVENOUS
  Administered 2015-06-13: 1 mg via INTRAVENOUS
  Filled 2015-06-11: qty 1
  Filled 2015-06-11: qty 2
  Filled 2015-06-11: qty 1
  Filled 2015-06-11 (×5): qty 2
  Filled 2015-06-11: qty 1
  Filled 2015-06-11 (×3): qty 2

## 2015-06-11 MED ORDER — MORPHINE SULFATE (PF) 2 MG/ML IV SOLN
2.0000 mg | INTRAVENOUS | Status: DC | PRN
  Administered 2015-06-11 (×2): 2 mg via INTRAVENOUS
  Filled 2015-06-11 (×2): qty 1

## 2015-06-11 MED ORDER — SODIUM CHLORIDE 0.9 % IJ SOLN
INTRAMUSCULAR | Status: AC
  Filled 2015-06-11: qty 10

## 2015-06-11 MED ORDER — BUPIVACAINE-EPINEPHRINE 0.25% -1:200000 IJ SOLN
INTRAMUSCULAR | Status: DC | PRN
  Administered 2015-06-11: 30 mL

## 2015-06-11 MED ORDER — PROMETHAZINE HCL 25 MG/ML IJ SOLN
6.2500 mg | INTRAMUSCULAR | Status: DC | PRN
  Administered 2015-06-11: 6.25 mg via INTRAVENOUS

## 2015-06-11 MED ORDER — HYDRALAZINE HCL 20 MG/ML IJ SOLN
INTRAMUSCULAR | Status: DC | PRN
  Administered 2015-06-11 (×3): 2.5 mg via INTRAVENOUS

## 2015-06-11 MED ORDER — PROPOFOL 10 MG/ML IV BOLUS
INTRAVENOUS | Status: DC | PRN
  Administered 2015-06-11: 200 mg via INTRAVENOUS
  Administered 2015-06-11: 100 mg via INTRAVENOUS

## 2015-06-11 MED ORDER — LIDOCAINE HCL (PF) 2 % IJ SOLN
INTRAMUSCULAR | Status: DC | PRN
  Administered 2015-06-11: 100 mg via INTRADERMAL

## 2015-06-11 MED ORDER — MIDAZOLAM HCL 2 MG/2ML IJ SOLN
INTRAMUSCULAR | Status: AC
  Filled 2015-06-11: qty 2

## 2015-06-11 MED ORDER — PHENYLEPHRINE 40 MCG/ML (10ML) SYRINGE FOR IV PUSH (FOR BLOOD PRESSURE SUPPORT)
PREFILLED_SYRINGE | INTRAVENOUS | Status: AC
  Filled 2015-06-11: qty 10

## 2015-06-11 MED ORDER — DIPHENHYDRAMINE HCL 50 MG/ML IJ SOLN
25.0000 mg | Freq: Four times a day (QID) | INTRAMUSCULAR | Status: DC | PRN
  Administered 2015-06-12 – 2015-06-13 (×2): 25 mg via INTRAVENOUS
  Filled 2015-06-11 (×2): qty 1

## 2015-06-11 MED ORDER — DEXAMETHASONE SODIUM PHOSPHATE 10 MG/ML IJ SOLN
INTRAMUSCULAR | Status: DC | PRN
  Administered 2015-06-11: 10 mg via INTRAVENOUS

## 2015-06-11 MED ORDER — DEXTROSE 5 % IV SOLN
2.0000 g | INTRAVENOUS | Status: AC
  Administered 2015-06-11: 2 g via INTRAVENOUS
  Filled 2015-06-11: qty 2

## 2015-06-11 MED ORDER — ALVIMOPAN 12 MG PO CAPS
12.0000 mg | ORAL_CAPSULE | Freq: Two times a day (BID) | ORAL | Status: DC
  Administered 2015-06-12 – 2015-06-15 (×8): 12 mg via ORAL
  Filled 2015-06-11 (×10): qty 1

## 2015-06-11 MED ORDER — LACTATED RINGERS IR SOLN
Status: DC | PRN
  Administered 2015-06-11: 1000 mL

## 2015-06-11 MED ORDER — METOPROLOL TARTRATE 5 MG/5ML IV SOLN: 5.0000 mg | Freq: Four times a day (QID) | INTRAVENOUS | Status: DC | PRN

## 2015-06-11 MED ORDER — ROCURONIUM BROMIDE 100 MG/10ML IV SOLN
INTRAVENOUS | Status: DC | PRN
  Administered 2015-06-11 (×2): 20 mg via INTRAVENOUS
  Administered 2015-06-11 (×2): 10 mg via INTRAVENOUS
  Administered 2015-06-11: 50 mg via INTRAVENOUS
  Administered 2015-06-11: 20 mg via INTRAVENOUS

## 2015-06-11 MED ORDER — SUGAMMADEX SODIUM 200 MG/2ML IV SOLN
INTRAVENOUS | Status: AC
  Filled 2015-06-11: qty 2

## 2015-06-11 MED ORDER — ALVIMOPAN 12 MG PO CAPS
12.0000 mg | ORAL_CAPSULE | Freq: Once | ORAL | Status: AC
  Administered 2015-06-11: 12 mg via ORAL
  Filled 2015-06-11: qty 1

## 2015-06-11 MED ORDER — ACETAMINOPHEN 10 MG/ML IV SOLN
INTRAVENOUS | Status: AC
  Filled 2015-06-11: qty 100

## 2015-06-11 MED ORDER — LACTATED RINGERS IV SOLN
INTRAVENOUS | Status: DC | PRN
  Administered 2015-06-11 (×3): via INTRAVENOUS

## 2015-06-11 MED ORDER — BUPIVACAINE-EPINEPHRINE 0.25% -1:200000 IJ SOLN
INTRAMUSCULAR | Status: AC
  Filled 2015-06-11: qty 1

## 2015-06-11 MED ORDER — HYDROMORPHONE HCL 2 MG/ML IJ SOLN
INTRAMUSCULAR | Status: AC
  Filled 2015-06-11: qty 1

## 2015-06-11 MED ORDER — HYDROMORPHONE HCL 1 MG/ML IJ SOLN
INTRAMUSCULAR | Status: DC | PRN
  Administered 2015-06-11 (×2): 0.5 mg via INTRAVENOUS
  Administered 2015-06-11: 1 mg via INTRAVENOUS

## 2015-06-11 MED ORDER — EPHEDRINE SULFATE 50 MG/ML IJ SOLN
INTRAMUSCULAR | Status: AC
  Filled 2015-06-11: qty 1

## 2015-06-11 MED ORDER — ATROPINE SULFATE 0.4 MG/ML IJ SOLN
INTRAMUSCULAR | Status: AC
  Filled 2015-06-11: qty 1

## 2015-06-11 MED ORDER — PHENYLEPHRINE HCL 10 MG/ML IJ SOLN
INTRAMUSCULAR | Status: DC | PRN
  Administered 2015-06-11: 80 ug via INTRAVENOUS

## 2015-06-11 MED ORDER — LIDOCAINE HCL (CARDIAC) 20 MG/ML IV SOLN
INTRAVENOUS | Status: AC
  Filled 2015-06-11: qty 5

## 2015-06-11 MED ORDER — HEPARIN SODIUM (PORCINE) 5000 UNIT/ML IJ SOLN
5000.0000 [IU] | Freq: Once | INTRAMUSCULAR | Status: AC
  Administered 2015-06-11: 5000 [IU] via SUBCUTANEOUS
  Filled 2015-06-11: qty 1

## 2015-06-11 MED ORDER — DEXAMETHASONE SODIUM PHOSPHATE 10 MG/ML IJ SOLN
INTRAMUSCULAR | Status: AC
  Filled 2015-06-11: qty 1

## 2015-06-11 MED ORDER — ONDANSETRON HCL 4 MG PO TABS
4.0000 mg | ORAL_TABLET | Freq: Four times a day (QID) | ORAL | Status: DC | PRN
  Administered 2015-06-16: 4 mg via ORAL
  Filled 2015-06-11: qty 1

## 2015-06-11 MED ORDER — CLINDAMYCIN PHOSPHATE 900 MG/50ML IV SOLN
900.0000 mg | Freq: Three times a day (TID) | INTRAVENOUS | Status: AC
  Administered 2015-06-11: 900 mg via INTRAVENOUS
  Filled 2015-06-11: qty 50

## 2015-06-11 SURGICAL SUPPLY — 90 items
BLADE EXTENDED COATED 6.5IN (ELECTRODE) IMPLANT
CANNULA REDUC XI 12-8 STAPL (CANNULA) ×1
CANNULA REDUCER 12-8 DVNC XI (CANNULA) ×1 IMPLANT
CELLS DAT CNTRL 66122 CELL SVR (MISCELLANEOUS) IMPLANT
CLIP LIGATING HEM O LOK PURPLE (MISCELLANEOUS) IMPLANT
CLIP LIGATING HEMOLOK MED (MISCELLANEOUS) IMPLANT
COUNTER NEEDLE 20 DBL MAG RED (NEEDLE) ×2 IMPLANT
COVER MAYO STAND STRL (DRAPES) ×4 IMPLANT
COVER TIP SHEARS 8 DVNC (MISCELLANEOUS) ×1 IMPLANT
COVER TIP SHEARS 8MM DA VINCI (MISCELLANEOUS) ×1
DECANTER SPIKE VIAL GLASS SM (MISCELLANEOUS) ×2 IMPLANT
DEVICE TROCAR PUNCTURE CLOSURE (ENDOMECHANICALS) IMPLANT
DRAPE ARM DVNC X/XI (DISPOSABLE) ×4 IMPLANT
DRAPE COLUMN DVNC XI (DISPOSABLE) ×1 IMPLANT
DRAPE DA VINCI XI ARM (DISPOSABLE) ×4
DRAPE DA VINCI XI COLUMN (DISPOSABLE) ×1
DRAPE SURG IRRIG POUCH 19X23 (DRAPES) ×2 IMPLANT
DRSG OPSITE POSTOP 4X10 (GAUZE/BANDAGES/DRESSINGS) IMPLANT
DRSG OPSITE POSTOP 4X6 (GAUZE/BANDAGES/DRESSINGS) IMPLANT
DRSG OPSITE POSTOP 4X8 (GAUZE/BANDAGES/DRESSINGS) IMPLANT
DRSG TELFA 3X8 NADH (GAUZE/BANDAGES/DRESSINGS) ×2 IMPLANT
ELECT PENCIL ROCKER SW 15FT (MISCELLANEOUS) ×4 IMPLANT
ELECT REM PT RETURN 15FT ADLT (MISCELLANEOUS) ×2 IMPLANT
ENDOLOOP SUT PDS II  0 18 (SUTURE)
ENDOLOOP SUT PDS II 0 18 (SUTURE) IMPLANT
EVACUATOR SILICONE 100CC (DRAIN) IMPLANT
GAUZE SPONGE 4X4 12PLY STRL (GAUZE/BANDAGES/DRESSINGS) ×1 IMPLANT
GLOVE BIO SURGEON STRL SZ 6.5 (GLOVE) ×6 IMPLANT
GLOVE BIOGEL PI IND STRL 7.0 (GLOVE) ×3 IMPLANT
GLOVE BIOGEL PI INDICATOR 7.0 (GLOVE) ×3
GOWN STRL REUS W/TWL 2XL LVL3 (GOWN DISPOSABLE) ×6 IMPLANT
GOWN STRL REUS W/TWL XL LVL3 (GOWN DISPOSABLE) ×8 IMPLANT
HOLDER FOLEY CATH W/STRAP (MISCELLANEOUS) ×2 IMPLANT
LEGGING LITHOTOMY PAIR STRL (DRAPES) ×2 IMPLANT
NDL INSUFFLATION 14GA 120MM (NEEDLE) ×1 IMPLANT
NEEDLE INSUFFLATION 14GA 120MM (NEEDLE) ×2 IMPLANT
PACK CARDIOVASCULAR III (CUSTOM PROCEDURE TRAY) ×2 IMPLANT
PACK COLON (CUSTOM PROCEDURE TRAY) ×2 IMPLANT
PAD DRESSING TELFA 3X8 NADH (GAUZE/BANDAGES/DRESSINGS) IMPLANT
PORT LAP GEL ALEXIS MED 5-9CM (MISCELLANEOUS) ×1 IMPLANT
RELOAD STAPLE 45 BLU REG DVNC (STAPLE) IMPLANT
RELOAD STAPLE 45 GRN THCK DVNC (STAPLE) IMPLANT
RETRACTOR WND ALEXIS 18 MED (MISCELLANEOUS) IMPLANT
RTRCTR WOUND ALEXIS 18CM MED (MISCELLANEOUS)
SCISSORS LAP 5X35 DISP (ENDOMECHANICALS) ×2 IMPLANT
SEAL CANN UNIV 5-8 DVNC XI (MISCELLANEOUS) ×3 IMPLANT
SEAL XI 5MM-8MM UNIVERSAL (MISCELLANEOUS) ×4
SEALER VESSEL DA VINCI XI (MISCELLANEOUS) ×1
SEALER VESSEL EXT DVNC XI (MISCELLANEOUS) ×1 IMPLANT
SET BI-LUMEN FLTR TB AIRSEAL (TUBING) ×4 IMPLANT
SET TUBE IRRIG SUCTION NO TIP (IRRIGATION / IRRIGATOR) ×2 IMPLANT
SLEEVE XCEL OPT CAN 5 100 (ENDOMECHANICALS) IMPLANT
SOLUTION ELECTROLUBE (MISCELLANEOUS) ×2 IMPLANT
STAPLER 45 BLU RELOAD XI (STAPLE) ×1 IMPLANT
STAPLER 45 BLUE RELOAD XI (STAPLE) ×1
STAPLER 45 GREEN RELOAD XI (STAPLE)
STAPLER 45 GRN RELOAD XI (STAPLE) IMPLANT
STAPLER CANNULA SEAL DVNC XI (STAPLE) ×1 IMPLANT
STAPLER CANNULA SEAL XI (STAPLE) ×1
STAPLER CIRC ILS CVD 33MM 37CM (STAPLE) ×1 IMPLANT
STAPLER SHEATH (SHEATH) ×1
STAPLER SHEATH ENDOWRIST DVNC (SHEATH) ×1 IMPLANT
STAPLER VISISTAT 35W (STAPLE) ×2 IMPLANT
SUT NOVA 1 T20/GS 25DT (SUTURE) ×4 IMPLANT
SUT PDS AB 1 CTX 36 (SUTURE) IMPLANT
SUT PDS AB 1 TP1 96 (SUTURE) IMPLANT
SUT PROLENE 2 0 KS (SUTURE) ×2 IMPLANT
SUT SILK 2 0 (SUTURE) ×2
SUT SILK 2 0 SH (SUTURE) ×1 IMPLANT
SUT SILK 2 0 SH CR/8 (SUTURE) ×2 IMPLANT
SUT SILK 2-0 18XBRD TIE 12 (SUTURE) ×1 IMPLANT
SUT SILK 3 0 (SUTURE) ×2
SUT SILK 3 0 SH CR/8 (SUTURE) ×2 IMPLANT
SUT SILK 3-0 18XBRD TIE 12 (SUTURE) ×1 IMPLANT
SUT V-LOC BARB 180 2/0GR6 GS22 (SUTURE)
SUT VIC AB 2-0 SH 18 (SUTURE) ×2 IMPLANT
SUT VIC AB 2-0 SH 27 (SUTURE) ×2
SUT VIC AB 2-0 SH 27X BRD (SUTURE) ×1 IMPLANT
SUT VIC AB 3-0 SH 18 (SUTURE) IMPLANT
SUT VIC AB 4-0 PS2 27 (SUTURE) ×4 IMPLANT
SUTURE V-LC BRB 180 2/0GR6GS22 (SUTURE) IMPLANT
SYRINGE 10CC LL (SYRINGE) ×2 IMPLANT
SYS LAPSCP GELPORT 120MM (MISCELLANEOUS)
SYSTEM LAPSCP GELPORT 120MM (MISCELLANEOUS) IMPLANT
TOWEL OR 17X26 10 PK STRL BLUE (TOWEL DISPOSABLE) ×2 IMPLANT
TOWEL OR NON WOVEN STRL DISP B (DISPOSABLE) ×2 IMPLANT
TRAY FOLEY W/METER SILVER 14FR (SET/KITS/TRAYS/PACK) ×1 IMPLANT
TRAY FOLEY W/METER SILVER 16FR (SET/KITS/TRAYS/PACK) ×2 IMPLANT
TROCAR BLADELESS OPT 5 100 (ENDOMECHANICALS) ×2 IMPLANT
TUBING CONNECTING 10 (TUBING) IMPLANT

## 2015-06-11 NOTE — Transfer of Care (Signed)
Immediate Anesthesia Transfer of Care Note  Patient: Tina Riggs  Procedure(s) Performed: Procedure(s): XI ROBOT ASSISTED LAPAROSCOPIC COLOSTOMY REVERSAL WITH LYSIS OF ADHESIONS AND RIGID PROCTOSCOPY (N/A) COLONOSCOPY (N/A)  Patient Location: PACU  Anesthesia Type:General  Level of Consciousness:  sedated, patient cooperative and responds to stimulation  Airway & Oxygen Therapy:Patient Spontanous Breathing and Patient connected to face mask oxgen  Post-op Assessment:  Report given to PACU RN and Post -op Vital signs reviewed and stable  Post vital signs:  Reviewed and stable  Last Vitals:  Filed Vitals:   06/11/15 0532 06/11/15 0543  BP: 154/84   Pulse: 89   Temp:  36.8 C  Resp: 18     Complications: No apparent anesthesia complications

## 2015-06-11 NOTE — H&P (Signed)
The patient is a 35 year old female presenting for a post-operative visit. Status post Hartmann's for diverticulitis in May 2016. She had a lot of ongoing pain issues, but states this is better once she got some teaching on how to pouch her ostomy. She was hospitalized in mid Oct for another episode of diverticulitis in her rectal stump. She was then lost to follow up for a while. She is back today to discuss reversal.    Problem List/Past Medical Leighton Ruff, MD; [id number] 3:05 PM) S/P LAPAROSCOPIC COLECTOMY (Z90.49) DIVERTICULITIS, COLON (K57.32) CHRONIC POST-OPERATIVE PAIN (G89.28) STATUS POST HARTMANN PROCEDURE (Z93.3)  Other Problems Leighton Ruff, MD; [id number] 3:05 PM) Unspecified Diagnosis  Allergies [name], LPN; [id number] PM) Penicillins Itching, Rash. Robaxin *MUSCULOSKELETAL THERAPY AGENTS* Swollen lips.  Medication History Leighton Ruff, MD; [id number] 3:05 PM) TiZANidine HCl (2MG  Tablet, 1 (one) Tablet Oral 3 times a day as needed, Taken starting 09/02/2014) Active. Methocarbamol (500MG  Tablet, Oral) Active. Advil (200MG  Capsule, Oral) Active. Naproxen Sodium (220MG  Capsule, Oral) Active. [employer] Nicotine (14MG /24HR Patch 24HR, Transdermal) Active. Nexplanon (68MG  Implant, Subcutaneous) Active. Medications Reconciled Neomycin Sulfate (500MG  Tablet, 2 (two) Tablet Oral SEE NOTE, Taken starting 05/11/2015) Active. (TAKE TWO TABLETS AT 2 PM, 3 PM, AND 10 PM THE DAY PRIOR TO SURGERY) Flagyl (500MG  Tablet, 2 (two) Tablet Oral SEE NOTE, Taken starting 05/11/2015) Active. (Take at 2pm, 3pm, and 10pm the day prior to your colon operation)  Social History Leighton Ruff, MD; [id number] 3:08 PM) Tobacco use Never smoker.  Note:no tob use, occ etoh     Review of Systems General Not Present- Appetite Loss, Chills, Fatigue, Fever, Night Sweats, Weight Gain and Weight Loss. Skin Not Present- Change in Wart/Mole, Dryness, Hives, Jaundice, New Lesions,  Non-Healing Wounds, Rash and Ulcer. HEENT Not Present- Earache, Hearing Loss, Hoarseness, Nose Bleed, Oral Ulcers, Ringing in the Ears, Seasonal Allergies, Sinus Pain, Sore Throat, Visual Disturbances, Wears glasses/contact lenses and Yellow Eyes. Respiratory Not Present- Bloody sputum, Chronic Cough, Difficulty Breathing, Snoring and Wheezing. Breast Not Present- Breast Mass, Breast Pain, Nipple Discharge and Skin Changes. Cardiovascular Not Present- Chest Pain, Difficulty Breathing Lying Down, Leg Cramps, Palpitations, Rapid Heart Rate, Shortness of Breath and Swelling of Extremities. Gastrointestinal Not Present- Abdominal Pain, Bloating, Bloody Stool, Change in Bowel Habits, Chronic diarrhea, Constipation, Difficulty Swallowing, Excessive gas, Gets full quickly at meals, Hemorrhoids, Indigestion, Nausea, Rectal Pain and Vomiting. Female Genitourinary Not Present- Frequency, Nocturia, Painful Urination, Pelvic Pain and Urgency. Musculoskeletal Not Present- Back Pain, Joint Pain, Joint Stiffness, Muscle Pain, Muscle Weakness and Swelling of Extremities. Neurological Not Present- Decreased Memory, Fainting, Headaches, Numbness, Seizures, Tingling, Tremor, Trouble walking and Weakness. Psychiatric Not Present- Anxiety, Bipolar, Change in Sleep Pattern, Depression, Fearful and Frequent crying. Endocrine Not Present- Cold Intolerance, Excessive Hunger, Hair Changes, Heat Intolerance, Hot flashes and New Diabetes. Hematology Not Present- Easy Bruising, Excessive bleeding, Gland problems, HIV and Persistent Infections.  BP 154/84 mmHg  Pulse 89  Temp(Src) 98.3 F (36.8 C) (Oral)  Resp 18  Ht 5' 6.5" (1.689 m)  Wt 123.378 kg (272 lb)  BMI 43.25 kg/m2  SpO2 100%  LMP 05/17/2015    Physical Exam  General Mental Status-Alert. General Appearance-Not in acute distress. Build & Nutrition-Well nourished. Posture-Normal posture. Gait-Normal.  Head and Neck Head-normocephalic,  atraumatic with no lesions or palpable masses. Trachea-midline.  Chest and Lung Exam Chest and lung exam reveals -on auscultation, normal breath sounds, no adventitious sounds and normal vocal resonance.  Cardiovascular Cardiovascular examination reveals -normal heart  sounds, regular rate and rhythm with no murmurs.  Abdomen Inspection Inspection of the abdomen reveals - No Hernias. Note: LLQ ostomy, lower midline incision. Palpation/Percussion Palpation and Percussion of the abdomen reveal - Soft, Non Tender, No Rigidity (guarding), No hepatosplenomegaly and No Palpable abdominal masses.  Neurologic Neurologic evaluation reveals -alert and oriented x 3 with no impairment of recent or remote memory, normal attention span and ability to concentrate, normal sensation and normal coordination.  Musculoskeletal Normal Exam - Bilateral-Upper Extremity Strength Normal and Lower Extremity Strength Normal.    Assessment & Plan   STATUS POST HARTMANN PROCEDURE (Z93.3) Impression: 35yo ready for ostomy reversal. All questions answered. We will plan on doing a colonoscopy prior to her anastomosis in the OR. The surgery and anatomy were described to the patient as well as the risks of surgery and the possible complications. These include: Bleeding, deep abdominal infections and possible wound complications such as hernia and infection, damage to adjacent structures, leak of surgical connections, which can lead to other surgeries and possibly an ostomy, possible need for other procedures, such as abscess drains in radiology, possible prolonged hospital stay, possible diarrhea from removal of part of the colon, possible constipation from narcotics, possible bowel, bladder or sexual dysfunction if having rectal surgery, prolonged fatigue/weakness or appetite loss, possible early recurrence of of disease, possible complications of their medical problems such as heart disease or arrhythmias or  lung problems, death (less than 1%). I believe the patient understands and wishes to proceed with the surgery.

## 2015-06-11 NOTE — Op Note (Signed)
06/11/2015  12:20 PM  PATIENT:  Tina Riggs  35 y.o. female  Patient Care Team: No Pcp Per Patient as PCP - General (General Practice)  PRE-OPERATIVE DIAGNOSIS:  diverticulitis  POST-OPERATIVE DIAGNOSIS:  diverticulitis  PROCEDURE:  XI ROBOT ASSISTED LAPAROSCOPIC COLOSTOMY REVERSAL WITH LYSIS OF ADHESIONS AND COLONOSCOPY    Surgeon(s): Leighton Ruff, MD Michael Boston, MD  ASSISTANT: Dr Johney Maine   ANESTHESIA:   local and general  EBL:  Total I/O In: 2100 [I.V.:2100] Out: 300 [Urine:200; Blood:100]  Delay start of Pharmacological VTE agent (>24hrs) due to surgical blood loss or risk of bleeding:  no  DRAINS: none   SPECIMEN:  Source of Specimen:  Sigmoid colon, colostomy  DISPOSITION OF SPECIMEN:  PATHOLOGY  COUNTS:  YES  PLAN OF CARE: Admit to inpatient   PATIENT DISPOSITION:  PACU - hemodynamically stable.  INDICATION:    35 year old female who presented to the hospital emergently approximately 9 months ago with perforated diverticulitis. A colectomy and colostomy were performed in standard Hartman's procedure. She is now ready for colostomy reversal.    The anatomy & physiology of the digestive tract was discussed.  The pathophysiology was discussed.  Natural history risks without surgery was discussed.   I worked to give an overview of the disease and the frequent need to have multispecialty involvement.  I feel the risks of no intervention will lead to serious problems that outweigh the operative risks; therefore, I recommended a partial colectomy to remove the pathology.  Laparoscopic & open techniques were discussed.   Risks such as bleeding, infection, abscess, leak, reoperation, possible ostomy, hernia, heart attack, death, and other risks were discussed.  I noted a good likelihood this will help address the problem.   Goals of post-operative recovery were discussed as well.    The patient expressed understanding & wished to proceed with surgery.  OR FINDINGS:    Patient had remaining sigmoid colon that was removed prior to anastomosis.  Significant small bowel adhesions throughout the abdomen.  The anastomosis rests 15 cm from the anal verge by rigid proctoscopy.  DESCRIPTION:   Informed consent was confirmed.  The patient underwent general anaesthesia without difficulty.  The patient was positioned appropriately.  VTE prevention in place.  The patient's abdomen was clipped, prepped, & draped in a sterile fashion.  Surgical timeout confirmed our plan.  The patient was positioned in reverse Trendelenburg.  Abdominal entry was gained using a varies needle in the left upper quadrant.  An 8 mm port was placed in the right upper quadrant after insufflation was performed. Entry was clean.  I induced carbon dioxide insufflation.  Camera inspection revealed no injury.  Extra ports were carefully placed under direct laparoscopic visualization.  The robot was docked to the patient's left side. I used robotic scissors to dissect the abdominal wall adhesions and small bowel. There was one portion of the small bowel with a small defect that was closed with a 3-0 silk suture. After the omentum and small bowel were off of the abdomen. I dissected the small bowel out of the pelvis continuing to lyse adhesions with the scissors. After proximal and one hour of lysis of adhesions we were able to identify the sigmoid stump. This was dissected free from surrounding tissues and elevated.  I reflected the greater omentum and the upper abdomen the small bowel in the upper abdomen. I scored the base of peritoneum of the right side of the mesentery of the left colon to the peritoneal reflection  of the mid rectum.  I elevated the sigmoid mesentery and enetered into the retro-mesenteric plane. We were able to identify the left ureter and gonadal vessels. We kept those posterior within the retroperitoneum and elevated the left colon mesentery off that. I did isolated IMA pedicle but did  not ligate it yet.  I continued distally and got into the avascular plane posterior to the mesorectum. This allowed me to help mobilize the rectum as well by freeing the mesorectum off the sacrum.  I mobilized the peritoneal coverings towards the peritoneal reflection on both the right and left sides of the rectum.  I could see the right and left ureters and stayed away from them.    I skeletonized the sigmoid and hemorrhoidal arteries. After confirming the left ureter was out of the way, I went ahead and ligated the arteries with a robotic bipolar vessel sealer.   We ensured hemostasis. I skeletonized the mesorectum at the junction at the proximal rectum using blunt dissection & bipolar vessel sealer.  I mobilized the left colon in a lateral to medial fashion off the line of Toldt up towards the splenic flexure to ensure good mobilization of the left colon to reach into the pelvis.  I mobilized the small bowel out of the left side of the pelvis and left abdomen using blunt dissection and sharp dissection. The colostomy site was also mobilized away from the abdominal wall up to the splenic flexure.  The robot was then undocked and an on table colonoscopy was performed to ensure there were no other colonic pathology. This was done with a standard colonoscope through the ostomy site. The prep was fair. I was able to see polyps approximately 1 cm in size or larger. There were no polyps or masses noted.  The colon was then desufflated and the colostomy site was taken down using blunt dissection electrocautery. The end of the colostomy was transected and a pursestring device was used to create a 2-0 Prolene pursestring through the distal portion of the ostomy.  A 33 mm EEA anvil was placed into the ostomy and secured with a Prolene suture. This was then placed back into the abdomen. A hand-assisted laparoscopic anastomosis was created without tension. There was no leak when tested with insufflation under water.  Hemostasis was good.  All ports were removed. The fascia was then closed using interrupted 0 Novafil sutures. The subcutaneous layer of the ostomy site was closed using a running 2-0 Vicryl suture. A pursestring 2-0 Vicryl suture was placed around the dermis and a Telfa wick was placed through the middle. A dressing was applied. The remaining port sites were closed with 4-0 Vicryl sutures and Dermabond. Patient tolerated procedure well and sent to the post anesthesia care unit in stable condition. All counts were correct per operating room staff.

## 2015-06-11 NOTE — Anesthesia Procedure Notes (Signed)
Procedure Name: Intubation Date/Time: 06/11/2015 7:39 AM Performed by: Lollie Sails Pre-anesthesia Checklist: Patient identified, Emergency Drugs available, Suction available, Patient being monitored and Timeout performed Patient Re-evaluated:Patient Re-evaluated prior to inductionOxygen Delivery Method: Circle system utilized Preoxygenation: Pre-oxygenation with 100% oxygen Intubation Type: IV induction Ventilation: Mask ventilation without difficulty Laryngoscope Size: Miller and 3 Grade View: Grade I Tube type: Oral Tube size: 7.5 mm Number of attempts: 4 Airway Equipment and Method: Stylet Placement Confirmation: ETT inserted through vocal cords under direct vision,  positive ETCO2 and breath sounds checked- equal and bilateral Secured at: 22 cm Tube secured with: Tape Dental Injury: Teeth and Oropharynx as per pre-operative assessment and Bloody posterior oropharynx  Difficulty Due To: Difficulty was unanticipated Future Recommendations: Recommend- induction with short-acting agent, and alternative techniques readily available Comments: DL by SRNA with copious secretions noted - Dr. Tresa Moore DL with minimal view - switched to Glidescope.   Able to visualize cords but copious secretions limited view - unable to pass tube to proper spot - DL by CRNA with Sabra Heck 3 with good view - tube passed successfully.   Patient masked easily between attempts.   No aspiration suspected.   O2 sat remained >95% for the entire procedure.

## 2015-06-11 NOTE — Anesthesia Postprocedure Evaluation (Signed)
Anesthesia Post Note  Patient: PAGAN LOVEALL  Procedure(s) Performed: Procedure(s) (LRB): XI ROBOT ASSISTED LAPAROSCOPIC COLOSTOMY REVERSAL WITH LYSIS OF ADHESIONS AND RIGID PROCTOSCOPY (N/A) COLONOSCOPY (N/A)  Patient location during evaluation: PACU Anesthesia Type: General Level of consciousness: awake and alert Pain management: pain level controlled Vital Signs Assessment: post-procedure vital signs reviewed and stable Respiratory status: non-rebreather facemask Cardiovascular status: blood pressure returned to baseline and stable Postop Assessment: no signs of nausea or vomiting Anesthetic complications: no    Last Vitals:  Filed Vitals:   06/11/15 1315 06/11/15 1330  BP: 127/87 119/79  Pulse: 73 84  Temp:  36.7 C  Resp: 18 18    Last Pain:  Filed Vitals:   06/11/15 1333  PainSc: Asleep                 Alexis Frock

## 2015-06-12 LAB — CBC
HCT: 39.2 % (ref 36.0–46.0)
HEMOGLOBIN: 12.9 g/dL (ref 12.0–15.0)
MCH: 30.9 pg (ref 26.0–34.0)
MCHC: 32.9 g/dL (ref 30.0–36.0)
MCV: 94 fL (ref 78.0–100.0)
Platelets: 311 10*3/uL (ref 150–400)
RBC: 4.17 MIL/uL (ref 3.87–5.11)
RDW: 14.5 % (ref 11.5–15.5)
WBC: 15.2 10*3/uL — ABNORMAL HIGH (ref 4.0–10.5)

## 2015-06-12 LAB — BASIC METABOLIC PANEL
ANION GAP: 6 (ref 5–15)
BUN: 8 mg/dL (ref 6–20)
CALCIUM: 8.1 mg/dL — AB (ref 8.9–10.3)
CO2: 25 mmol/L (ref 22–32)
Chloride: 106 mmol/L (ref 101–111)
Creatinine, Ser: 0.76 mg/dL (ref 0.44–1.00)
Glucose, Bld: 123 mg/dL — ABNORMAL HIGH (ref 65–99)
Potassium: 3.8 mmol/L (ref 3.5–5.1)
Sodium: 137 mmol/L (ref 135–145)

## 2015-06-12 MED ORDER — OXYCODONE HCL 5 MG PO TABS
5.0000 mg | ORAL_TABLET | Freq: Four times a day (QID) | ORAL | Status: DC | PRN
  Administered 2015-06-12 – 2015-06-13 (×2): 10 mg via ORAL
  Filled 2015-06-12 (×2): qty 2

## 2015-06-12 NOTE — Progress Notes (Signed)
Pt has had small red-brown BM and voided 50 cc. Also had complaint of RUQ pain after some po fluids, pain eased when she stopped drinking. Will try later. Tina Riggs, [employer]

## 2015-06-12 NOTE — Progress Notes (Signed)
[facility] Office:  [phone] General Surgery Progress Note   LOS: 1 day  POD -  1 Day Post-Op  Assessment/Plan: 1.  XI ROBOT ASSISTED LAPAROSCOPIC COLOSTOMY REVERSAL WITH LYSIS OF ADHESIONS AND RIGID PROCTOSCOPY,  COLONOSCOPY - 06/11/2015 Tina Riggs  On Entereg  To start clear liquids  2.  DVT prophylaxis - Lovenox   Active Problems:   Diverticular disease  Subjective:  Doing very well.  Walking the hall with her husband, Glendell Docker.  Objective:   Filed Vitals:   06/12/15 0549 06/12/15 1010  BP: 122/67 121/85  Pulse: 66 75  Temp: 98.5 F (36.9 C) 98.7 F (37.1 C)  Resp: 16 16     Intake/Output from previous day:  06/09 0701 - 06/10 0700 In: 3573.3 [P.O.:75; I.V.:3498.3] Out: 1675 [Urine:1575; Blood:100]  Intake/Output this shift:  Total I/O In: 0  Out: 250 [Urine:250]   Physical Exam:   General: Obese AA F who is alert and oriented.    HEENT: Normal. Pupils equal. .   Lungs: Clear   Abdomen: Soft   Wound: Okay   Lab Results:    Recent Labs  06/09/15 1300 06/12/15 0408  WBC 9.4 15.2*  HGB 13.4 12.9  HCT 39.2 39.2  PLT 317 311    BMET   Recent Labs  06/09/15 1300 06/12/15 0408  NA 138 137  K 4.1 3.8  CL 108 106  CO2 23 25  GLUCOSE 87 123*  BUN 8 8  CREATININE 0.60 0.76  CALCIUM 8.7* 8.1*    PT/INR  No results for input(s): LABPROT, INR in the last 72 hours.  ABG  No results for input(s): PHART, HCO3 in the last 72 hours.  Invalid input(s): PCO2, PO2   Studies/Results:  No results found.   Anti-infectives:   Anti-infectives    Start     Dose/Rate Route Frequency Ordered Stop   06/11/15 1600  clindamycin (CLEOCIN) IVPB 900 mg     900 mg 100 mL/hr over 30 Minutes Intravenous Every 8 hours 06/11/15 1358 06/11/15 1631   06/11/15 0530  cefoTEtan (CEFOTAN) 2 g in dextrose 5 % 50 mL IVPB     2 g 100 mL/hr over 30 Minutes Intravenous On call to O.R. 06/11/15 0530 06/11/15 0755      Alphonsa Overall, MD, FACS Pager:  [facility] Office: [phone] 06/12/2015

## 2015-06-13 LAB — CBC
HEMATOCRIT: 36.7 % (ref 36.0–46.0)
HEMOGLOBIN: 12.1 g/dL (ref 12.0–15.0)
MCH: 31.4 pg (ref 26.0–34.0)
MCHC: 33 g/dL (ref 30.0–36.0)
MCV: 95.3 fL (ref 78.0–100.0)
Platelets: 289 10*3/uL (ref 150–400)
RBC: 3.85 MIL/uL — ABNORMAL LOW (ref 3.87–5.11)
RDW: 14.7 % (ref 11.5–15.5)
WBC: 11 10*3/uL — AB (ref 4.0–10.5)

## 2015-06-13 LAB — BASIC METABOLIC PANEL
ANION GAP: 5 (ref 5–15)
BUN: 6 mg/dL (ref 6–20)
CHLORIDE: 104 mmol/L (ref 101–111)
CO2: 26 mmol/L (ref 22–32)
Calcium: 8.1 mg/dL — ABNORMAL LOW (ref 8.9–10.3)
Creatinine, Ser: 0.71 mg/dL (ref 0.44–1.00)
GFR calc Af Amer: >60 mL/min (ref >60)
GLUCOSE: 115 mg/dL — AB (ref 65–99)
POTASSIUM: 3.9 mmol/L (ref 3.5–5.1)
SODIUM: 135 mmol/L (ref 135–145)

## 2015-06-13 LAB — PREGNANCY, URINE: Preg Test, Ur: NEGATIVE

## 2015-06-13 MED ORDER — OXYCODONE HCL 5 MG PO TABS
5.0000 mg | ORAL_TABLET | ORAL | Status: DC | PRN
  Administered 2015-06-13: 10 mg via ORAL
  Administered 2015-06-13: 5 mg via ORAL
  Administered 2015-06-13 – 2015-06-16 (×12): 10 mg via ORAL
  Filled 2015-06-13 (×15): qty 2

## 2015-06-13 MED ORDER — KETOROLAC TROMETHAMINE 30 MG/ML IJ SOLN
30.0000 mg | Freq: Once | INTRAMUSCULAR | Status: AC
  Administered 2015-06-13: 30 mg via INTRAVENOUS
  Filled 2015-06-13: qty 1

## 2015-06-13 MED ORDER — FENTANYL CITRATE (PF) 100 MCG/2ML IJ SOLN
25.0000 ug | INTRAMUSCULAR | Status: DC | PRN
  Administered 2015-06-13 – 2015-06-14 (×3): 25 ug via INTRAVENOUS
  Filled 2015-06-13 (×3): qty 2

## 2015-06-13 NOTE — Progress Notes (Signed)
Pt c/o unrelieved pain, asking for additional med & heating pad; has temp pf 100.8. Notified Dr Marlou Starks & received orders for x1 toradol & k-pad. Tina Riggs, [employer]

## 2015-06-13 NOTE — Progress Notes (Addendum)
[facility] Office:  [phone] General Surgery Progress Note   LOS: 2 days  POD -  2 Days Post-Op  Assessment/Plan: 1.  XI ROBOT ASSISTED LAPAROSCOPIC COLOSTOMY REVERSAL WITH LYSIS OF ADHESIONS AND RIGID PROCTOSCOPY,  COLONOSCOPY - 06/11/2015 Tina Riggs  On Entereg  Leave on clear liquids.  Change pain meds.  2.  Start dressing changes.  Has wick in wound.  3.  She has a Nexplan (sp) in her left arm.  This was placed in 2013 - good for 3 years.  She wonders whether she is pregnant.  Will check urine pregnancy.  4.  DVT prophylaxis - Lovenox   Active Problems:   Diverticular disease  Subjective:  Doesn't feel as good as yesterday.  Does not like the dilaudid, it made her "skiin burn".  Does not like morphine.  Wants to try to take just the oxycodone.  She is passing flatus.  Husband, Glendell Docker, asleep in chair.  Objective:   Filed Vitals:   06/12/15 2105 06/13/15 0545  BP: 132/78 146/89  Pulse: 66 56  Temp: 98.5 F (36.9 C) 98.7 F (37.1 C)  Resp: 14 15     Intake/Output from previous day:  06/10 0701 - 06/11 0700 In: 610 [P.O.:60; I.V.:550] Out: 1885 [Urine:1885]  Intake/Output this shift:      Physical Exam:   General: Obese AA F who is alert and oriented.    HEENT: Normal. Pupils equal. .   Lungs: Clear   Abdomen: Soft   Wound: LLQ wound has wick.  Will change dressing.   Lab Results:     Recent Labs  06/12/15 0408 06/13/15 0430  WBC 15.2* 11.0*  HGB 12.9 12.1  HCT 39.2 36.7  PLT 311 289    BMET    Recent Labs  06/12/15 0408 06/13/15 0430  NA 137 135  K 3.8 3.9  CL 106 104  CO2 25 26  GLUCOSE 123* 115*  BUN 8 6  CREATININE 0.76 0.71  CALCIUM 8.1* 8.1*    PT/INR  No results for input(s): LABPROT, INR in the last 72 hours.  ABG  No results for input(s): PHART, HCO3 in the last 72 hours.  Invalid input(s): PCO2, PO2   Studies/Results:  No results found.   Anti-infectives:   Anti-infectives    Start     Dose/Rate Route  Frequency Ordered Stop   06/11/15 1600  clindamycin (CLEOCIN) IVPB 900 mg     900 mg 100 mL/hr over 30 Minutes Intravenous Every 8 hours 06/11/15 1358 06/11/15 1631   06/11/15 0530  cefoTEtan (CEFOTAN) 2 g in dextrose 5 % 50 mL IVPB     2 g 100 mL/hr over 30 Minutes Intravenous On call to O.R. 06/11/15 0530 06/11/15 0755      Alphonsa Overall, MD, FACS Pager: [facility] Office: [phone] 06/13/2015

## 2015-06-13 NOTE — Progress Notes (Signed)
Pt just mentioned to me that she may be pregnant, and asked what she should do.  I advised her to let her physician know when they rounded on her today.

## 2015-06-14 LAB — BASIC METABOLIC PANEL
ANION GAP: 6 (ref 5–15)
BUN: <5 mg/dL — ABNORMAL LOW (ref 6–20)
CHLORIDE: 106 mmol/L (ref 101–111)
CO2: 24 mmol/L (ref 22–32)
Calcium: 8 mg/dL — ABNORMAL LOW (ref 8.9–10.3)
Creatinine, Ser: 0.63 mg/dL (ref 0.44–1.00)
GFR calc Af Amer: >60 mL/min (ref >60)
GFR calc non Af Amer: >60 mL/min (ref >60)
GLUCOSE: 108 mg/dL — AB (ref 65–99)
POTASSIUM: 4.3 mmol/L (ref 3.5–5.1)
Sodium: 136 mmol/L (ref 135–145)

## 2015-06-14 LAB — CBC
HEMATOCRIT: 33.3 % — AB (ref 36.0–46.0)
HEMOGLOBIN: 11.1 g/dL — AB (ref 12.0–15.0)
MCH: 31.6 pg (ref 26.0–34.0)
MCHC: 33.3 g/dL (ref 30.0–36.0)
MCV: 94.9 fL (ref 78.0–100.0)
Platelets: 253 10*3/uL (ref 150–400)
RBC: 3.51 MIL/uL — ABNORMAL LOW (ref 3.87–5.11)
RDW: 14.4 % (ref 11.5–15.5)
WBC: 9.5 10*3/uL (ref 4.0–10.5)

## 2015-06-14 MED ORDER — ACETAMINOPHEN 325 MG PO TABS
650.0000 mg | ORAL_TABLET | Freq: Four times a day (QID) | ORAL | Status: DC
  Administered 2015-06-14 – 2015-06-15 (×5): 650 mg via ORAL
  Filled 2015-06-14 (×6): qty 2

## 2015-06-14 MED ORDER — KETOROLAC TROMETHAMINE 30 MG/ML IJ SOLN
30.0000 mg | Freq: Three times a day (TID) | INTRAMUSCULAR | Status: DC
  Administered 2015-06-14 – 2015-06-15 (×4): 30 mg via INTRAVENOUS
  Filled 2015-06-14 (×4): qty 1

## 2015-06-14 MED ORDER — LIP MEDEX EX OINT
TOPICAL_OINTMENT | CUTANEOUS | Status: AC
  Administered 2015-06-14: 14:00:00
  Filled 2015-06-14: qty 7

## 2015-06-14 NOTE — Progress Notes (Signed)
3 Days Post-Op robotic LOA and colostomy reversal Subjective: Complains of pain and abd distention, no flatus  Objective: Vital signs in last 24 hours: Temp:  [98.7 F (37.1 C)-100.8 F (38.2 C)] 99 F (37.2 C) (06/12 0414) Pulse Rate:  [49-81] 49 (06/12 0414) Resp:  [16-19] 19 (06/12 0414) BP: (119-144)/(69-87) 131/81 mmHg (06/12 0414) SpO2:  [94 %-100 %] 94 % (06/12 0414)   Intake/Output from previous day: 06/11 0701 - 06/12 0700 In: 2766.7 [P.O.:360; I.V.:2406.7] Out: 500 [Urine:500] Intake/Output this shift:     General appearance: alert and cooperative GI: soft, distended  Incision: no significant erythema  Lab Results:   Recent Labs  06/13/15 0430 06/14/15 0343  WBC 11.0* 9.5  HGB 12.1 11.1*  HCT 36.7 33.3*  PLT 289 253   BMET  Recent Labs  06/13/15 0430 06/14/15 0343  NA 135 136  K 3.9 4.3  CL 104 106  CO2 26 24  GLUCOSE 115* 108*  BUN 6 <5*  CREATININE 0.71 0.63  CALCIUM 8.1* 8.0*   PT/INR No results for input(s): LABPROT, INR in the last 72 hours. ABG No results for input(s): PHART, HCO3 in the last 72 hours.  Invalid input(s): PCO2, PO2  MEDS, Scheduled . acetaminophen  650 mg Oral QID  . alvimopan  12 mg Oral BID  . enoxaparin (LOVENOX) injection  40 mg Subcutaneous Q24H  . ketorolac  30 mg Intravenous Q8H    Studies/Results: No results found.  Assessment: s/p Procedure(s): XI ROBOT ASSISTED LAPAROSCOPIC COLOSTOMY REVERSAL WITH LYSIS OF ADHESIONS AND RIGID PROCTOSCOPY COLONOSCOPY Patient Active Problem List   Diagnosis Date Noted  . 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 Diverticulitis large intestine w/o perforation or abscess w/o bleeding 10/18/2014  . Diverticulitis of colon 04/29/2014  . Diverticulitis 04/29/2014  . Chlamydia 04/14/2014  . Trichomonal vaginitis   . Diverticulitis of colon with perforation 04/13/2014  . Bacterial vaginosis 04/13/2014  . Tobacco use 04/13/2014  . Alcohol use ([place]) 04/13/2014    Post op  ileus  Plan: NPO today  Scheduled toradol and tylenol, heating pad for non-narcotic pain relief Ambulate   LOS: 3 days     .Rosario Adie, [facility], [place]   06/14/2015 8:32 AM

## 2015-06-14 NOTE — Progress Notes (Signed)
Date:  June12, 2017 Chart reviewed for concurrent status and case management needs. Will continue to follow the patient for changes and needs: post op ileus Expected discharge date: [id number] Velva Harman, BSN, [place], [place]

## 2015-06-15 MED ORDER — IBUPROFEN 400 MG PO TABS
600.0000 mg | ORAL_TABLET | Freq: Four times a day (QID) | ORAL | Status: DC
  Administered 2015-06-15 – 2015-06-16 (×4): 600 mg via ORAL
  Filled 2015-06-15 (×4): qty 1

## 2015-06-15 NOTE — Progress Notes (Signed)
4 Days Post-Op robotic LOA and colostomy reversal Subjective: pain and abd distention better, having lots of flatus  Objective: Vital signs in last 24 hours: Temp:  [98.3 F (36.8 C)-98.6 F (37 C)] 98.3 F (36.8 C) (06/13 0604) Pulse Rate:  [66-79] 66 (06/13 0604) Resp:  [17-18] 17 (06/13 0604) BP: (123-130)/(78-99) 130/99 mmHg (06/13 0604) SpO2:  [100 %] 100 % (06/13 0604)   Intake/Output from previous day: 06/12 0701 - 06/13 0700 In: 2033.3 [P.O.:120; I.V.:1913.3] Out: -  Intake/Output this shift:   General appearance: alert and cooperative GI: soft, distended  Incision: no significant erythema, wick removed  Lab Results:   Recent Labs  06/13/15 0430 06/14/15 0343  WBC 11.0* 9.5  HGB 12.1 11.1*  HCT 36.7 33.3*  PLT 289 253   BMET  Recent Labs  06/13/15 0430 06/14/15 0343  NA 135 136  K 3.9 4.3  CL 104 106  CO2 26 24  GLUCOSE 115* 108*  BUN 6 <5*  CREATININE 0.71 0.63  CALCIUM 8.1* 8.0*   PT/INR No results for input(s): LABPROT, INR in the last 72 hours. ABG No results for input(s): PHART, HCO3 in the last 72 hours.  Invalid input(s): PCO2, PO2  MEDS, Scheduled . acetaminophen  650 mg Oral QID  . alvimopan  12 mg Oral BID  . enoxaparin (LOVENOX) injection  40 mg Subcutaneous Q24H  . ketorolac  30 mg Intravenous Q8H    Studies/Results: No results found.  Assessment: s/p Procedure(s): XI ROBOT ASSISTED LAPAROSCOPIC COLOSTOMY REVERSAL WITH LYSIS OF ADHESIONS AND RIGID PROCTOSCOPY COLONOSCOPY Patient Active Problem List   Diagnosis Date Noted  . 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 Diverticulitis large intestine w/o perforation or abscess w/o bleeding 10/18/2014  . Diverticulitis of colon 04/29/2014  . Diverticulitis 04/29/2014  . Chlamydia 04/14/2014  . Trichomonal vaginitis   . Diverticulitis of colon with perforation 04/13/2014  . Bacterial vaginosis 04/13/2014  . Tobacco use 04/13/2014  . Alcohol use ([place]) 04/13/2014    Post op  ileus seems to be resolving  Plan: Full liquids today Scheduled ibuprofen and tylenol, heating pad for non-narcotic pain relief PO narcotics Ambulate   LOS: 4 days     .Rosario Adie, [facility], [place]   06/15/2015 7:34 AM

## 2015-06-15 NOTE — Discharge Instructions (Addendum)
ABDOMINAL SURGERY: POST OP INSTRUCTIONS  1. DIET: Follow a light bland diet the first 24 hours after arrival home, such as soup, liquids, crackers, etc.  Be sure to include lots of fluids daily.  Avoid fast food or heavy meals as your are more likely to get nauseated.  Do not eat any uncooked fruits or vegetables for the next 2 weeks as your colon heals. 2. Take your usually prescribed home medications unless otherwise directed. 3. PAIN CONTROL: a. Pain is best controlled by a usual combination of three different methods TOGETHER: i. Ice/Heat ii. Over the counter pain medication iii. Prescription pain medication b. Most patients will experience some swelling and bruising around the incisions.  Ice packs or heating pads (30-60 minutes up to 6 times a day) will help. Use ice for the first few days to help decrease swelling and bruising, then switch to heat to help relax tight/sore spots and speed recovery.  Some people prefer to use ice alone, heat alone, alternating between ice & heat.  Experiment to what works for you.  Swelling and bruising can take several weeks to resolve.   c. It is helpful to take an over-the-counter pain medication regularly for the first few weeks.  Choose one of the following that works best for you: i. Naproxen (Aleve, etc)  Two 220mg  tabs twice a day ii. Ibuprofen (Advil, etc) Three 200mg  tabs four times a day (every meal & bedtime) iii. Acetaminophen (Tylenol, etc) 500-650mg  four times a day (every meal & bedtime) d. A  prescription for pain medication (such as oxycodone, hydrocodone, etc) should be given to you upon discharge.  Take your pain medication as prescribed.  i. If you are having problems/concerns with the prescription medicine (does not control pain, nausea, vomiting, rash, itching, etc), please call us [phone] to see if we need to switch you to a different pain medicine that will work better for you and/or control your side effect better. ii. If you  need a refill on your pain medication, please contact your pharmacy.  They will contact our office to request authorization. Prescriptions will not be filled after 5 pm or on week-ends. 4. Avoid getting constipated.  Between the surgery and the pain medications, it is common to experience some constipation.  Increasing fluid intake and taking a fiber supplement (such as Metamucil, Citrucel, FiberCon, MiraLax, etc) 1-2 times a day regularly will usually help prevent this problem from occurring.  A mild laxative (prune juice, Milk of Magnesia, MiraLax, etc) should be taken according to package directions if there are no bowel movements after 48 hours.   5. Watch out for diarrhea.  If you have many loose bowel movements, simplify your diet to bland foods & liquids for a few days.  Stop any stool softeners and decrease your fiber supplement.  Switching to mild anti-diarrheal medications (Kayopectate, Pepto Bismol) can help.  If this worsens or does not improve, please call us. 6. Wash / shower every day.  You may shower over the incision / wound.  Avoid baths until the skin is fully healed.   7. Remove your bandages everyday.  Wash the area with warm, soapy water, pat dry and replace bandage with a clean one.   8. ACTIVITIES as tolerated:   a. You may resume regular (light) daily activities beginning the next day--such as daily self-care, walking, climbing stairs--gradually increasing activities as tolerated.  If you can walk 30 minutes without difficulty, it is safe to try more intense activity  such as jogging, treadmill, bicycling, low-impact aerobics, swimming, etc. b. Save the most intensive and strenuous activity for last such as sit-ups, heavy lifting, contact sports, etc  Refrain from any heavy lifting or straining until you are off narcotics for pain control.   c. DO NOT PUSH THROUGH PAIN.  Let pain be your guide: If it hurts to do something, don't do it.  Pain is your body warning you to avoid that  activity for another week until the pain goes down. d. You may drive when you are no longer taking prescription pain medication, you can comfortably wear a seatbelt, and you can safely maneuver your car and apply brakes. e. Dennis Bast may have sexual intercourse when it is comfortable.  9. FOLLOW UP in our office a. Please call [facility] at [phone] to set up an appointment to see your surgeon in the office for a follow-up appointment approximately 1-2 weeks after your surgery. b. Make sure that you call for this appointment the day you arrive home to insure a convenient appointment time. 10. IF YOU HAVE DISABILITY OR FAMILY LEAVE FORMS, BRING THEM TO THE OFFICE FOR PROCESSING.  DO NOT GIVE THEM TO YOUR DOCTOR.   WHEN TO CALL us [phone]: 1. Poor pain control 2. Reactions / problems with new medications (rash/itching, nausea, etc)  3. Fever over 101.5 F (38.5 C) 4. Inability to urinate 5. Nausea and/or vomiting 6. Worsening swelling or bruising 7. Continued bleeding from incision. 8. Increased pain, redness, or drainage from the incision  The clinic staff is available to answer your questions during regular business hours (8:30am-5pm).  Please dont hesitate to call and ask to speak to one of our nurses for clinical concerns.   A surgeon from [facility] is always on call at the hospitals   If you have a medical emergency, go to the nearest emergency room or call 911.    [facility], [place], [place], [place], [place]  [id number] ? MAIN: [phone] ? TOLL FREE: [phone] ? FAX [phone] [email or web address]

## 2015-06-16 MED ORDER — OXYCODONE HCL 5 MG PO TABS: 5.0000 mg | ORAL_TABLET | ORAL | Status: DC | PRN

## 2015-06-16 NOTE — Progress Notes (Signed)
1100 discharge instructions given to patient  D Mateo Flow RN

## 2015-06-16 NOTE — Discharge Summary (Signed)
Physician Discharge Summary  Patient ID: Tina Riggs MRN: [id number] DOB/AGE: Sep 09, 1980 35 y.o.  Admit date: 06/11/2015 Discharge date: 06/16/2015  Admission Diagnoses: Diverticular disease, colostomy in place  Discharge Diagnoses:  Active Problems:   Diverticular disease   Discharged Condition: good  Hospital Course: Patient was admitted after surgery.  Her diet was advanced slowly.  She developed a bit of an ileus and was made NPO for 24 hrs.  She began to have bowel function and her diet was advanced again.  She was discharged once tolerating a diet and her pain was controlled with PO meds.    Consults: None  Significant Diagnostic Studies: labs: cbc, chemistry  Treatments: IV hydration, analgesia: oxycodone and surgery: robotic assisted colostomy reversal with LOA.  Discharge Exam: Blood pressure 142/89, pulse 65, temperature 98.3 F (36.8 C), temperature source Oral, resp. rate 18, height 5' 6.5" (1.689 m), weight 123.378 kg (272 lb), last menstrual period 05/17/2015, SpO2 98 %. General appearance: alert and cooperative GI: normal findings: soft, non-tender Incision/Wound: clean, draining appropriately   Disposition: 01-Home or Self Care     Medication List    TAKE these medications        etonogestrel 68 MG Impl implant  Commonly known as:  NEXPLANON  1 each by Subdermal route once. 2013     HAIR SKIN NAILS Caps  Take 2 capsules by mouth daily.     ibuprofen 200 MG tablet  Commonly known as:  ADVIL,MOTRIN  Take 600 mg by mouth every 6 (six) hours as needed for mild pain or moderate pain.     oxyCODONE 5 MG immediate release tablet  Commonly known as:  Oxy IR/ROXICODONE  Take 1-2 tablets (5-10 mg total) by mouth every 4 (four) hours as needed for moderate pain.     Vitamin D-3 1000 units Caps  Take 2,000 Units by mouth daily.     vitamin E 100 UNIT capsule  Take by mouth daily.           Follow-up Information    Follow up with Rosario Adie.,  MD. Schedule an appointment as soon as possible for a visit in 2 weeks.   Specialty:  General Surgery   Contact information:   [address] [place] [address] [phone]       Signed: Rosario Adie [id number], [id number] AM

## 2015-06-17 ENCOUNTER — Ambulatory Visit: Payer: Self-pay | Admitting: Podiatry

## 2015-06-29 ENCOUNTER — Ambulatory Visit: Payer: 59 | Admitting: Certified Nurse Midwife

## 2015-08-10 ENCOUNTER — Ambulatory Visit: Payer: Medicaid Other | Admitting: Certified Nurse Midwife

## 2015-08-25 ENCOUNTER — Emergency Department (HOSPITAL_COMMUNITY): Payer: Medicaid Other

## 2015-08-25 ENCOUNTER — Encounter (HOSPITAL_COMMUNITY): Payer: Self-pay | Admitting: Emergency Medicine

## 2015-08-25 ENCOUNTER — Emergency Department (HOSPITAL_COMMUNITY)
Admission: EM | Admit: 2015-08-25 | Discharge: 2015-08-25 | Disposition: A | Discharge status: Home / Self Care | Payer: Medicaid Other | Attending: Emergency Medicine | Admitting: Emergency Medicine

## 2015-08-25 DIAGNOSIS — N898 Other specified noninflammatory disorders of vagina: Secondary | ICD-10-CM | POA: Diagnosis not present

## 2015-08-25 DIAGNOSIS — R1084 Generalized abdominal pain: Secondary | ICD-10-CM | POA: Diagnosis present

## 2015-08-25 DIAGNOSIS — K5732 Diverticulitis of large intestine without perforation or abscess without bleeding: Secondary | ICD-10-CM

## 2015-08-25 DIAGNOSIS — F17210 Nicotine dependence, cigarettes, uncomplicated: Secondary | ICD-10-CM | POA: Insufficient documentation

## 2015-08-25 LAB — URINALYSIS, ROUTINE W REFLEX MICROSCOPIC
Bilirubin Urine: NEGATIVE
GLUCOSE, UA: NEGATIVE mg/dL
KETONES UR: NEGATIVE mg/dL
LEUKOCYTES UA: NEGATIVE
Nitrite: NEGATIVE
PH: 6 (ref 5.0–8.0)
Protein, ur: NEGATIVE mg/dL
Specific Gravity, Urine: 1.034 — ABNORMAL HIGH (ref 1.005–1.030)

## 2015-08-25 LAB — CBC WITH DIFFERENTIAL/PLATELET
BASOS ABS: 0 10*3/uL (ref 0.0–0.1)
BASOS PCT: 0 %
Eosinophils Absolute: 0.1 10*3/uL (ref 0.0–0.7)
Eosinophils Relative: 2 %
HEMATOCRIT: 39.4 % (ref 36.0–46.0)
HEMOGLOBIN: 12.5 g/dL (ref 12.0–15.0)
LYMPHS PCT: 27 %
Lymphs Abs: 2.5 10*3/uL (ref 0.7–4.0)
MCH: 29.5 pg (ref 26.0–34.0)
MCHC: 31.7 g/dL (ref 30.0–36.0)
MCV: 92.9 fL (ref 78.0–100.0)
Monocytes Absolute: 0.6 10*3/uL (ref 0.1–1.0)
Monocytes Relative: 7 %
NEUTROS ABS: 6 10*3/uL (ref 1.7–7.7)
NEUTROS PCT: 64 %
Platelets: 319 10*3/uL (ref 150–400)
RBC: 4.24 MIL/uL (ref 3.87–5.11)
RDW: 14.3 % (ref 11.5–15.5)
WBC: 9.3 10*3/uL (ref 4.0–10.5)

## 2015-08-25 LAB — URINE MICROSCOPIC-ADD ON
Bacteria, UA: NONE SEEN
RBC / HPF: NONE SEEN RBC/hpf (ref 0–5)

## 2015-08-25 LAB — I-STAT CHEM 8, ED
BUN: 13 mg/dL (ref 6–20)
CALCIUM ION: 1.17 mmol/L (ref 1.13–1.30)
CREATININE: 0.7 mg/dL (ref 0.44–1.00)
Chloride: 104 mmol/L (ref 101–111)
GLUCOSE: 88 mg/dL (ref 65–99)
HCT: 40 % (ref 36.0–46.0)
HEMOGLOBIN: 13.6 g/dL (ref 12.0–15.0)
POTASSIUM: 3.9 mmol/L (ref 3.5–5.1)
Sodium: 139 mmol/L (ref 135–145)
TCO2: 25 mmol/L (ref 0–100)

## 2015-08-25 LAB — COMPREHENSIVE METABOLIC PANEL
ALBUMIN: 3.2 g/dL — AB (ref 3.5–5.0)
ALK PHOS: 74 U/L (ref 38–126)
ALT: 11 U/L — AB (ref 14–54)
AST: 16 U/L (ref 15–41)
Anion gap: 6 (ref 5–15)
BILIRUBIN TOTAL: 0.5 mg/dL (ref 0.3–1.2)
BUN: 12 mg/dL (ref 6–20)
CALCIUM: 8.9 mg/dL (ref 8.9–10.3)
CO2: 22 mmol/L (ref 22–32)
CREATININE: 0.63 mg/dL (ref 0.44–1.00)
Chloride: 109 mmol/L (ref 101–111)
GFR calc Af Amer: >60 mL/min (ref >60)
GFR calc non Af Amer: >60 mL/min (ref >60)
GLUCOSE: 91 mg/dL (ref 65–99)
POTASSIUM: 3.8 mmol/L (ref 3.5–5.1)
Sodium: 137 mmol/L (ref 135–145)
TOTAL PROTEIN: 5.9 g/dL — AB (ref 6.5–8.1)

## 2015-08-25 LAB — I-STAT CG4 LACTIC ACID, ED: Lactic Acid, Venous: 0.51 mmol/L (ref 0.5–1.9)

## 2015-08-25 LAB — WET PREP, GENITAL
SPERM: NONE SEEN
Trich, Wet Prep: NONE SEEN
Yeast Wet Prep HPF POC: NONE SEEN

## 2015-08-25 LAB — I-STAT BETA HCG BLOOD, ED (MC, WL, AP ONLY): I-stat hCG, quantitative: <5 m[IU]/mL (ref <5)

## 2015-08-25 LAB — LIPASE, BLOOD: Lipase: 13 U/L (ref 11–51)

## 2015-08-25 MED ORDER — DOCUSATE SODIUM 100 MG PO CAPS
100.0000 mg | ORAL_CAPSULE | Freq: Two times a day (BID) | ORAL | 0 refills | Status: AC
Start: 2015-08-25 — End: 2015-09-04

## 2015-08-25 MED ORDER — DIPHENHYDRAMINE HCL 25 MG PO CAPS
25.0000 mg | ORAL_CAPSULE | Freq: Once | ORAL | Status: AC
  Administered 2015-08-25: 25 mg via ORAL
  Filled 2015-08-25: qty 1

## 2015-08-25 MED ORDER — HYDROCODONE-ACETAMINOPHEN 5-325 MG PO TABS: 1.0000 | ORAL_TABLET | ORAL | 0 refills | Status: DC | PRN

## 2015-08-25 MED ORDER — HYDROMORPHONE HCL 1 MG/ML IJ SOLN
1.0000 mg | Freq: Once | INTRAMUSCULAR | Status: AC
  Administered 2015-08-25: 1 mg via INTRAVENOUS
  Filled 2015-08-25: qty 1

## 2015-08-25 MED ORDER — SODIUM CHLORIDE 0.9 % IV BOLUS (SEPSIS)
1000.0000 mL | Freq: Once | INTRAVENOUS | Status: AC
  Administered 2015-08-25: 1000 mL via INTRAVENOUS

## 2015-08-25 MED ORDER — CIPROFLOXACIN HCL 500 MG PO TABS: 500.0000 mg | ORAL_TABLET | Freq: Two times a day (BID) | ORAL | 0 refills | Status: AC

## 2015-08-25 MED ORDER — SODIUM CHLORIDE 0.9 % IV SOLN
1000.0000 mL | Freq: Once | INTRAVENOUS | Status: AC
Start: 2015-08-25 — End: 2015-08-25
  Administered 2015-08-25: 1000 mL via INTRAVENOUS

## 2015-08-25 MED ORDER — OXYCODONE HCL 5 MG PO TABS
5.0000 mg | ORAL_TABLET | ORAL | 0 refills | Status: DC | PRN
Start: 2015-08-25 — End: 2015-09-26

## 2015-08-25 MED ORDER — METRONIDAZOLE 500 MG PO TABS: 500.0000 mg | ORAL_TABLET | Freq: Three times a day (TID) | ORAL | 0 refills | Status: AC

## 2015-08-25 MED ORDER — ONDANSETRON HCL 4 MG/2ML IJ SOLN
4.0000 mg | Freq: Once | INTRAMUSCULAR | Status: AC
  Administered 2015-08-25: 4 mg via INTRAVENOUS
  Filled 2015-08-25: qty 2

## 2015-08-25 MED ORDER — IOPAMIDOL (ISOVUE-300) INJECTION 61%
INTRAVENOUS | Status: AC
  Administered 2015-08-25: 100 mL
  Filled 2015-08-25: qty 100

## 2015-08-25 NOTE — ED Provider Notes (Signed)
[facility] DEPT Provider Note   CSN: [id number] Arrival date & time: 08/25/15  0917     History   Chief Complaint Chief Complaint  Patient presents with  . Abdominal Pain    HPI Tina Riggs is a 35 y.o. female.  The history is provided by the patient, medical records and the EMS personnel.    35 year old female with past medical history of perforated diverticulitis status post sigmoid colectomy and colostomy reversal in June who presents with diffuse abdominal pain. The patient states her symptoms started as right sided, sharp abdominal pain over the last 2 weeks. The pain is been intermittent. Denies any association with eating or other known triggers. Over the last week. She's had progressively worsening right-sided and now left-sided abdominal pain. She also feels as though her abdomen has become more distended and she has become nauseous. She is still having bowel movements. Denies any fevers or chills. She describes pain as a generalized aching, throbbing sensation with intermittent cramping. Her only other associated symptom is mild vaginal discharge. No vaginal bleeding. No other medical complaints. No dysuria or hematuria or frequency.  Past Medical History:  Diagnosis Date  . Diverticulitis    hospitalized 04/13/2014; hospitalized 04/29/2014  . HPV in female     Patient Active Problem List   Diagnosis Date Noted  . 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 Diverticulitis large intestine w/o perforation or abscess w/o bleeding 10/18/2014  . Diverticulitis of colon 04/29/2014  . Diverticulitis 04/29/2014  . Chlamydia 04/14/2014  . Trichomonal vaginitis   . Diverticulitis of colon with perforation 04/13/2014  . Bacterial vaginosis 04/13/2014  . Tobacco use 04/13/2014  . Alcohol use ([place]) 04/13/2014    Past Surgical History:  Procedure Laterality Date  . CESAREAN SECTION  02/2006  . COLONOSCOPY N/A 06/11/2015   Procedure: COLONOSCOPY;  Surgeon: Leighton Ruff, MD;   Location: [facility];  Service: General;  Laterality: N/A;  . COLOSTOMY N/A 05/18/2014   Procedure: COLOSTOMY;  Surgeon: Georganna Skeans, MD;  Location: [facility];  Service: General;  Laterality: N/A;  . COLOSTOMY REVISION N/A 05/18/2014   Procedure: COLON RESECTION SIGMOID;  Surgeon: Georganna Skeans, MD;  Location: [facility];  Service: General;  Laterality: N/A;  . COLPOSCOPY  Sep 2012  . LAPAROSCOPIC LYSIS OF ADHESIONS N/A 05/18/2014   Procedure: LAPAROSCOPIC MOBILIZATION OF SPLENIC FLEXURE;  Surgeon: Georganna Skeans, MD;  Location: [facility];  Service: General;  Laterality: N/A;    OB History    Gravida Para Term Preterm AB Living   1 1 1     1    SAB TAB Ectopic Multiple Live Births                   Home Medications    Prior to Admission medications   Medication Sig Start Date End Date Taking? Authorizing Provider  ibuprofen (ADVIL,MOTRIN) 200 MG tablet Take 600 mg by mouth every 6 (six) hours as needed for mild pain or moderate pain.   Yes Historical Provider, MD  Multiple Vitamins-Minerals (HAIR SKIN NAILS) CAPS Take 2 capsules by mouth daily.   Yes Historical Provider, MD  vitamin E 100 UNIT capsule Take by mouth daily.   Yes Historical Provider, MD  ciprofloxacin (CIPRO) 500 MG tablet Take 1 tablet (500 mg total) by mouth 2 (two) times daily. 08/25/15 09/04/15  Duffy Bruce, MD  docusate sodium (COLACE) 100 MG capsule Take 1 capsule (100 mg total) by mouth every 12 (twelve) hours. 08/25/15 09/04/15  Duffy Bruce, MD  etonogestrel (NEXPLANON) 68 MG IMPL implant 1 each by Subdermal route once. 2013    Historical Provider, MD  metroNIDAZOLE (FLAGYL) 500 MG tablet Take 1 tablet (500 mg total) by mouth 3 (three) times daily. 08/25/15 09/04/15  Duffy Bruce, MD  oxyCODONE (ROXICODONE) 5 MG immediate release tablet Take 1 tablet (5 mg total) by mouth every 4 (four) hours as needed for severe pain. 08/25/15   Duffy Bruce, MD    Family History History reviewed. No pertinent family history.  Social  History Social History  Substance Use Topics  . Smoking status: Current Every Day Smoker    Packs/day: 0.50    Years: 16.00    Types: Cigarettes  . Smokeless tobacco: Never Used  . Alcohol use 0.0 oz/week     Comment: 4 beers per week      Allergies   Penicillins   Review of Systems Review of Systems  Constitutional: Negative for chills and fever.  HENT: Negative for congestion, rhinorrhea and sore throat.   Eyes: Negative for visual disturbance.  Respiratory: Negative for cough, shortness of breath and wheezing.   Cardiovascular: Negative for chest pain and leg swelling.  Gastrointestinal: Positive for abdominal distention, abdominal pain, diarrhea and nausea. Negative for blood in stool and vomiting.  Genitourinary: Positive for vaginal discharge. Negative for dysuria, flank pain, vaginal bleeding and vaginal pain.  Musculoskeletal: Negative for neck pain.  Skin: Negative for rash.  Allergic/Immunologic: Negative for immunocompromised state.  Neurological: Negative for syncope and headaches.  Hematological: Does not bruise/bleed easily.  All other systems reviewed and are negative.    Physical Exam Updated Vital Signs BP (!) 163/142 (BP Location: Left Arm)   Pulse (!) 53   Temp 98.3 F (36.8 C) (Oral)   Resp 18   SpO2 100%   Physical Exam  Constitutional: She is oriented to person, place, and time. She appears well-developed and well-nourished. No distress.  HENT:  Head: Normocephalic and atraumatic.  Eyes: Conjunctivae are normal.  Neck: Neck supple.  Cardiovascular: Normal rate, regular rhythm and normal heart sounds.  Exam reveals no friction rub.   No murmur heard. Pulmonary/Chest: Effort normal and breath sounds normal. No respiratory distress. She has no wheezes. She has no rales.  Abdominal: Soft. Bowel sounds are normal. She exhibits no distension. There is tenderness (Mild, diffuse, distractible tenderness.). There is no rebound and no guarding.    Postoperative site is clean, dry and intact. There is no surrounding erythema. No swelling. No appreciable hernias.  Genitourinary: Pelvic exam was performed with patient supine. There is no rash or lesion on the right labia. There is no rash or lesion on the left labia. Cervix exhibits no motion tenderness, no discharge and no friability. Right adnexum displays no mass, no tenderness and no fullness. Left adnexum displays no mass, no tenderness and no fullness. No erythema in the vagina. No foreign body in the vagina. No signs of injury around the vagina. Vaginal discharge (Moderate, white) found.  Musculoskeletal: She exhibits no edema.  Neurological: She is alert and oriented to person, place, and time. She exhibits normal muscle tone.  Skin: Skin is warm. Capillary refill takes less than 2 seconds.  Psychiatric: She has a normal mood and affect.  Nursing note and vitals reviewed.    ED Treatments / Results  Labs (all labs ordered are listed, but only abnormal results are displayed) Labs Reviewed  WET PREP, GENITAL - Abnormal; Notable for the following:       Result Value  Clue Cells Wet Prep HPF POC PRESENT (*)    WBC, Wet Prep HPF POC MODERATE (*)    All other components within normal limits  COMPREHENSIVE METABOLIC PANEL - Abnormal; Notable for the following:    Total Protein 5.9 (*)    Albumin 3.2 (*)    ALT 11 (*)    All other components within normal limits  URINALYSIS, ROUTINE W REFLEX MICROSCOPIC (NOT AT [facility]) - Abnormal; Notable for the following:    Specific Gravity, Urine 1.034 (*)    Hgb urine dipstick TRACE (*)    All other components within normal limits  URINE MICROSCOPIC-ADD ON - Abnormal; Notable for the following:    Squamous Epithelial / LPF 0-5 (*)    All other components within normal limits  CBC WITH DIFFERENTIAL/PLATELET  LIPASE, BLOOD  I-STAT CHEM 8, ED  I-STAT BETA HCG BLOOD, ED ([facility], [facility], [facility] ONLY)  I-STAT CG4 LACTIC ACID, ED  GC/CHLAMYDIA PROBE AMP  () NOT AT [facility]    EKG  EKG Interpretation None       Radiology Ct Abdomen Pelvis W Contrast  Result Date: 08/25/2015 CLINICAL DATA:  Low abdominal pain and distention for 1 or 2 days. History of diverticulitis with partial bowel resection and colostomy. EXAM: CT ABDOMEN AND PELVIS WITH CONTRAST TECHNIQUE: Multidetector CT imaging of the abdomen and pelvis was performed using the standard protocol following bolus administration of intravenous contrast. CONTRAST:  140mL ISOVUE-300 IOPAMIDOL (ISOVUE-300) INJECTION 61% COMPARISON:  Abdominal pelvic CT 10/17/2014 FINDINGS: Lower chest: Clear lung bases. No significant pleural or pericardial effusion. Hepatobiliary: The liver is normal in density without focal abnormality. No evidence of gallstones, gallbladder wall thickening or biliary dilatation. Pancreas: Unremarkable. No pancreatic ductal dilatation or surrounding inflammatory changes. Spleen: Normal in size without focal abnormality. Adrenals/Urinary Tract: Both adrenal glands appear normal. The kidneys appear normal without evidence of urinary tract calculus, suspicious lesion or hydronephrosis. No bladder abnormalities are seen. Stomach/Bowel: No enteric contrast was administered. The stomach, small bowel and appendix appear normal. Patient has undergone interval takedown of the descending colostomy. The distal colonic anastomosis appears patent. The sigmoid colon just proximal to the anastomosis demonstrates possible mild wall thickening and surrounding inflammation. No evidence of bowel obstruction or perforation. Vascular/Lymphatic: There are no enlarged abdominal or pelvic lymph nodes. No significant vascular findings are present. Reproductive: Left fundal fibroid measuring 4.6 x 4.3 cm is similar to the prior examination. There is no residual left ovarian cystic lesion. There is a low-density lesion in the right adnexa measuring 5.4 x 3.9 cm on image 74. This is probably an incidental  right ovarian cyst. There is no surrounding inflammatory change. Other: Postsurgical changes in the anterior abdominal wall. No evidence of hernia. No significant ascites. Musculoskeletal: No acute or significant osseous findings. IMPRESSION: 1. Interval colostomy takedown and anastomosis. Mild sigmoid diverticulitis proximal to the anastomosis cannot be excluded. 2. No evidence of bowel perforation or abscess. The appendix appears normal. 3. Fluctuating ovarian cysts as described. Stable exophytic left uterine fibroid. Electronically Signed   By: Richardean Sale M.D.   On: 08/25/2015 12:21    Procedures Procedures (including critical care time)  Medications Ordered in ED Medications  0.9 %  sodium chloride infusion (0 mLs Intravenous Stopped 08/25/15 1251)  HYDROmorphone (DILAUDID) injection 1 mg (1 mg Intravenous Given 08/25/15 1013)  ondansetron (ZOFRAN) injection 4 mg (4 mg Intravenous Given 08/25/15 1011)  sodium chloride 0.9 % bolus 1,000 mL (0 mLs Intravenous Stopped 08/25/15 1251)  iopamidol (ISOVUE-300) 61 % injection (100 mLs  Contrast Given 08/25/15 1143)  HYDROmorphone (DILAUDID) injection 1 mg (1 mg Intravenous Given 08/25/15 1213)  diphenhydrAMINE (BENADRYL) capsule 25 mg (25 mg Oral Given 08/25/15 1254)     Initial Impression / Assessment and Plan / ED Course  I have reviewed the triage vital signs and the nursing notes.  Pertinent labs & imaging results that were available during my care of the patient were reviewed by me and considered in my medical decision making (see chart for details).  Clinical Course   35 year old female with past medical history of colectomy due to diverticulitis, status post reversal in June, who presents with diffuse abdominal pain. On arrival, vital signs are stable and within normal limits. Exam is as above. Patient has no peritonitis or signs of obstruction. Initial differential is recurrent diverticulitis, postoperative, location, enteritis, chronic  abdominal pain, less likely UTI. No signs of PID on exam. Will check CT scan and reassess.  Lab work and imaging reviewed as above. Patient has no leukocytosis. Her CMP is unremarkable. Urinalysis shows dehydration, but no signs of UTI. She has bacterial vaginosis but no Trichomonas. Lactic acid is 0.5. CT scan shows possible mild diverticulitis proximal to anastomosis, but otherwise no acute abnormalities. I discussed the case with [place] on call. Given patient's otherwise reassuring labs and exam, he feels that outpatient management is appropriate. I will start the patient on Cipro and Flagyl. This will also cover her bacterial vaginosis. Strict return precautions given in detail.  Final Clinical Impressions(s) / ED Diagnoses   Final diagnoses:  Diverticulitis of large intestine without perforation or abscess without bleeding    New Prescriptions Discharge Medication List as of 08/25/2015 12:44 PM    START taking these medications   Details  ciprofloxacin (CIPRO) 500 MG tablet Take 1 tablet (500 mg total) by mouth 2 (two) times daily., Starting Wed 08/25/2015, Until Sat 09/04/2015, Print    docusate sodium (COLACE) 100 MG capsule Take 1 capsule (100 mg total) by mouth every 12 (twelve) hours., Starting Wed 08/25/2015, Until Sat 09/04/2015, Print    metroNIDAZOLE (FLAGYL) 500 MG tablet Take 1 tablet (500 mg total) by mouth 3 (three) times daily., Starting Wed 08/25/2015, Until Sat 09/04/2015, Print    HYDROcodone-acetaminophen (NORCO/VICODIN) 5-325 MG tablet Take 1 tablet by mouth every 4 (four) hours as needed for severe pain., Starting Wed 08/25/2015, Print         Duffy Bruce, MD 08/25/15 [phone]

## 2015-08-25 NOTE — ED Triage Notes (Signed)
Pt from home via [facility] with c/o right sided abdominal pain x 1 week.  Denies N/V/D.  Reverse colostomy this past June.  NAD, ambulatory, A&O.

## 2015-08-26 LAB — GC/CHLAMYDIA PROBE AMP (~~LOC~~) NOT AT ARMC
CHLAMYDIA, DNA PROBE: NEGATIVE
NEISSERIA GONORRHEA: NEGATIVE

## 2015-09-14 ENCOUNTER — Ambulatory Visit (INDEPENDENT_AMBULATORY_CARE_PROVIDER_SITE_OTHER): Payer: Medicaid Other

## 2015-09-14 ENCOUNTER — Ambulatory Visit (INDEPENDENT_AMBULATORY_CARE_PROVIDER_SITE_OTHER): Payer: Medicaid Other | Admitting: Sports Medicine

## 2015-09-14 ENCOUNTER — Encounter: Payer: Self-pay | Admitting: Sports Medicine

## 2015-09-14 VITALS — Ht 66.0 in | Wt 260.0 lb

## 2015-09-14 DIAGNOSIS — M79673 Pain in unspecified foot: Secondary | ICD-10-CM

## 2015-09-14 DIAGNOSIS — M799 Soft tissue disorder, unspecified: Secondary | ICD-10-CM | POA: Diagnosis not present

## 2015-09-14 DIAGNOSIS — Q828 Other specified congenital malformations of skin: Secondary | ICD-10-CM

## 2015-09-14 DIAGNOSIS — M7989 Other specified soft tissue disorders: Secondary | ICD-10-CM

## 2015-09-14 DIAGNOSIS — M722 Plantar fascial fibromatosis: Secondary | ICD-10-CM

## 2015-09-14 MED ORDER — MELOXICAM 15 MG PO TABS: 15.0000 mg | ORAL_TABLET | Freq: Every day | ORAL | 0 refills | Status: DC

## 2015-09-14 MED ORDER — METHYLPREDNISOLONE 4 MG PO TBPK: ORAL_TABLET | ORAL | 0 refills | Status: DC

## 2015-09-14 NOTE — Progress Notes (Signed)
Subjective: Tina Riggs is a 35 y.o. female patient presents to office with complaint of heel pain that extends to arch bilateral. Patient admits to post static dyskinesia for months in duration, states that she can not walk or stand long periods of time due to pain. Patient has treated this problem with stretching with no relief. Patient also was seen last November for pain and had injections in ankles and had warts injected and cut out the no relief.Patient states that she is here for 2nd opinion because still have pain with regrowth of lesions and bottom and no relief after injections. Patient also had bilateral ultrasounds that showed capsulitis in office. Denies any other pedal complaints.   Patient Active Problem List   Diagnosis Date Noted  . 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 Diverticulitis large intestine w/o perforation or abscess w/o bleeding 10/18/2014  . Diverticulitis of colon 04/29/2014  . Diverticulitis 04/29/2014  . Chlamydia 04/14/2014  . Trichomonal vaginitis   . Diverticulitis of colon with perforation 04/13/2014  . Bacterial vaginosis 04/13/2014  . Tobacco use 04/13/2014  . Alcohol use ([place]) 04/13/2014    Current Outpatient Prescriptions on File Prior to Visit  Medication Sig Dispense Refill  . etonogestrel (NEXPLANON) 68 MG IMPL implant 1 each by Subdermal route once. 2013    . ibuprofen (ADVIL,MOTRIN) 200 MG tablet Take 600 mg by mouth every 6 (six) hours as needed for mild pain or moderate pain.    . Multiple Vitamins-Minerals (HAIR SKIN NAILS) CAPS Take 2 capsules by mouth daily.    Marland Kitchen oxyCODONE (ROXICODONE) 5 MG immediate release tablet Take 1 tablet (5 mg total) by mouth every 4 (four) hours as needed for severe pain. 10 tablet 0  . vitamin E 100 UNIT capsule Take by mouth daily.     No current facility-administered medications on file prior to visit.     Allergies  Allergen Reactions  . Penicillins Itching, Rash and Other (See Comments)    Has patient had  a PCN reaction causing immediate rash, facial/tongue/throat swelling, SOB or lightheadedness with hypotension: yes Has patient had a PCN reaction causing severe rash involving mucus membranes or skin necrosis: no Has patient had a PCN reaction that required hospitalization no Has patient had a PCN reaction occurring within the last 10 years: no If all of the above answers are "NO", then may proceed with Cephalosporin use.     Objective: Physical Exam General: The patient is alert and oriented x3 in no acute distress.  Dermatology: Skin is warm, dry and supple bilateral lower extremities. Nails 1-10 are short and thick. There is no erythema, edema, no eccymosis, no open lesions present. Soft tissue mass plantar hallux that is non mobile, right.+ callus x 4 with nucleated core plantar aspect of the left foot suggestive of porokeratosis. Dry skin plantar aspect bilateral. Integument is otherwise unremarkable.  Vascular: Dorsalis Pedis pulse and Posterior Tibial pulse are 2/4 bilateral. Capillary fill time is immediate to all digits.  Neurological: Grossly intact to light touch with an achilles reflex of +2/5 and a  negative Tinel's sign bilateral.  Musculoskeletal: No pain to right hallux soft tissue mass. There is Tenderness to palpation at the medial calcaneal tubercale and through the insertion of the plantar fascia bilateral extending along arches. No pain with compression of calcaneus bilateral. No pain with tuning fork to calcaneus bilateral. No pain with calf compression bilateral. There is decreased Ankle joint range of motion bilateral. All other joints range of motion within normal  limits bilateral. Mild hammertoe. Strength 5/5 in all groups bilateral.   Xray, Right and Left foot:  Normal osseous mineralization. Joint spaces preserved except Mild narrowing at anterior ankles. No fracture/dislocation/boney destruction. + Hammertoe. Minimal Calcaneal spur present with mild thickening of  plantar fascia. No other soft tissue abnormalities or radiopaque foreign bodies.   Assessment and Plan: Problem List Items Addressed This Visit    None    Visit Diagnoses    Foot pain, unspecified laterality    -  Primary   Relevant Medications   methylPREDNISolone (MEDROL DOSEPAK) 4 MG TBPK tablet   meloxicam (MOBIC) 15 MG tablet   Other Relevant Orders   DG Foot 2 Views Left   DG Foot 2 Views Right   Plantar fasciitis       Relevant Medications   methylPREDNISolone (MEDROL DOSEPAK) 4 MG TBPK tablet   meloxicam (MOBIC) 15 MG tablet   Porokeratosis       Mass of soft tissue       R hallux     -Complete examination performed.  -Xrays reviewed -Discussed with patient in detail the condition of plantar fasciitis, how this occurs and general treatment options. Explained both conservative and surgical treatments.  -Rx Meloxicam to start after Medrol dose pack is completed -Recommended good supportive shoes and advised use of OTC insert. -Explained and dispensed to patient daily stretching exercises. -Recommend patient to ice affected area 1-2x daily. -Debrided keratosis left foot and applied salinocaine -Recommend vinegar soaks and daily skin emollients for dryness -Rx MRI to eval Right hallux soft tissue mass -Patient to return to office in 4 weeks for follow up or sooner if problems or questions arise.  Landis Martins, DPM

## 2015-09-14 NOTE — Patient Instructions (Signed)

## 2015-09-26 ENCOUNTER — Encounter (HOSPITAL_COMMUNITY): Payer: Self-pay | Admitting: *Deleted

## 2015-09-26 ENCOUNTER — Emergency Department (HOSPITAL_COMMUNITY)
Admission: EM | Admit: 2015-09-26 | Discharge: 2015-09-26 | Disposition: A | Discharge status: Home / Self Care | Payer: Medicaid Other | Attending: Emergency Medicine | Admitting: Emergency Medicine

## 2015-09-26 DIAGNOSIS — N390 Urinary tract infection, site not specified: Secondary | ICD-10-CM | POA: Diagnosis not present

## 2015-09-26 DIAGNOSIS — F17210 Nicotine dependence, cigarettes, uncomplicated: Secondary | ICD-10-CM | POA: Diagnosis not present

## 2015-09-26 DIAGNOSIS — K5792 Diverticulitis of intestine, part unspecified, without perforation or abscess without bleeding: Secondary | ICD-10-CM | POA: Insufficient documentation

## 2015-09-26 DIAGNOSIS — R319 Hematuria, unspecified: Secondary | ICD-10-CM

## 2015-09-26 DIAGNOSIS — R1030 Lower abdominal pain, unspecified: Secondary | ICD-10-CM | POA: Diagnosis present

## 2015-09-26 LAB — COMPREHENSIVE METABOLIC PANEL WITH GFR
ALT: 15 U/L (ref 14–54)
AST: 19 U/L (ref 15–41)
Albumin: 3.2 g/dL — ABNORMAL LOW (ref 3.5–5.0)
Alkaline Phosphatase: 81 U/L (ref 38–126)
Anion gap: 10 (ref 5–15)
BUN: 8 mg/dL (ref 6–20)
CO2: 23 mmol/L (ref 22–32)
Calcium: 8.4 mg/dL — ABNORMAL LOW (ref 8.9–10.3)
Chloride: 106 mmol/L (ref 101–111)
Creatinine, Ser: 0.8 mg/dL (ref 0.44–1.00)
GFR calc Af Amer: >60 mL/min
GFR calc non Af Amer: >60 mL/min
Glucose, Bld: 109 mg/dL — ABNORMAL HIGH (ref 65–99)
Potassium: 3.7 mmol/L (ref 3.5–5.1)
Sodium: 139 mmol/L (ref 135–145)
Total Bilirubin: 0.4 mg/dL (ref 0.3–1.2)
Total Protein: 6.6 g/dL (ref 6.5–8.1)

## 2015-09-26 LAB — URINALYSIS, ROUTINE W REFLEX MICROSCOPIC
GLUCOSE, UA: NEGATIVE mg/dL
Ketones, ur: NEGATIVE mg/dL
Nitrite: POSITIVE — AB
PROTEIN: 100 mg/dL — AB
Specific Gravity, Urine: >1.03 — ABNORMAL HIGH (ref 1.005–1.030)
pH: 6 (ref 5.0–8.0)

## 2015-09-26 LAB — URINE MICROSCOPIC-ADD ON

## 2015-09-26 LAB — CBC
HCT: 40 % (ref 36.0–46.0)
Hemoglobin: 12.8 g/dL (ref 12.0–15.0)
MCH: 29.8 pg (ref 26.0–34.0)
MCHC: 32 g/dL (ref 30.0–36.0)
MCV: 93.2 fL (ref 78.0–100.0)
PLATELETS: 367 10*3/uL (ref 150–400)
RBC: 4.29 MIL/uL (ref 3.87–5.11)
RDW: 14.8 % (ref 11.5–15.5)
WBC: 8.3 10*3/uL (ref 4.0–10.5)

## 2015-09-26 LAB — LIPASE, BLOOD: Lipase: 18 U/L (ref 11–51)

## 2015-09-26 LAB — I-STAT BETA HCG BLOOD, ED (MC, WL, AP ONLY)

## 2015-09-26 MED ORDER — OXYCODONE HCL 5 MG PO TABS: 5.0000 mg | ORAL_TABLET | Freq: Four times a day (QID) | ORAL | 0 refills | Status: DC | PRN

## 2015-09-26 MED ORDER — CIPROFLOXACIN HCL 500 MG PO TABS: 500.0000 mg | ORAL_TABLET | Freq: Two times a day (BID) | ORAL | 0 refills | Status: AC

## 2015-09-26 MED ORDER — METRONIDAZOLE 500 MG PO TABS
500.0000 mg | ORAL_TABLET | Freq: Three times a day (TID) | ORAL | 0 refills | Status: AC
Start: 2015-09-26 — End: 2015-10-10

## 2015-09-26 NOTE — ED Provider Notes (Signed)
[facility] DEPT Provider Note   CSN: [id number] Arrival date & time: 09/26/15  1010     History   Chief Complaint Chief Complaint  Patient presents with  . Abdominal Pain  . Back Pain    HPI Tina Riggs is a 35 y.o. female.  The history is provided by the patient.  Back Pain   This is a recurrent problem. Episode onset: 3 days ago. The problem occurs constantly. The problem has not changed since onset.The pain does not radiate. The pain is moderate. Associated symptoms include abdominal pain (diffuse). Pertinent negatives include no fever, no bowel incontinence, no bladder incontinence and no dysuria. She has tried nothing for the symptoms. Risk factors include obesity (multiple surgeries).    Past Medical History:  Diagnosis Date  . Diverticulitis    hospitalized 04/13/2014; hospitalized 04/29/2014  . HPV in female     Patient Active Problem List   Diagnosis Date Noted  . 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 Diverticulitis large intestine w/o perforation or abscess w/o bleeding 10/18/2014  . Diverticulitis of colon 04/29/2014  . Diverticulitis 04/29/2014  . Chlamydia 04/14/2014  . Trichomonal vaginitis   . Diverticulitis of colon with perforation 04/13/2014  . Bacterial vaginosis 04/13/2014  . Tobacco use 04/13/2014  . Alcohol use ([place]) 04/13/2014    Past Surgical History:  Procedure Laterality Date  . CESAREAN SECTION  02/2006  . COLONOSCOPY N/A 06/11/2015   Procedure: COLONOSCOPY;  Surgeon: Leighton Ruff, MD;  Location: [facility];  Service: General;  Laterality: N/A;  . COLOSTOMY N/A 05/18/2014   Procedure: COLOSTOMY;  Surgeon: Georganna Skeans, MD;  Location: [facility];  Service: General;  Laterality: N/A;  . COLOSTOMY REVISION N/A 05/18/2014   Procedure: COLON RESECTION SIGMOID;  Surgeon: Georganna Skeans, MD;  Location: [facility];  Service: General;  Laterality: N/A;  . COLPOSCOPY  Sep 2012  . LAPAROSCOPIC LYSIS OF ADHESIONS N/A 05/18/2014   Procedure: LAPAROSCOPIC  MOBILIZATION OF SPLENIC FLEXURE;  Surgeon: Georganna Skeans, MD;  Location: [facility];  Service: General;  Laterality: N/A;    OB History    Gravida Para Term Preterm AB Living   1 1 1     1    SAB TAB Ectopic Multiple Live Births                   Home Medications    Prior to Admission medications   Medication Sig Start Date End Date Taking? Authorizing Provider  ciprofloxacin (CIPRO) 500 MG tablet Take 500 mg by mouth 2 (two) times daily.   Yes Historical Provider, MD  etonogestrel (NEXPLANON) 68 MG IMPL implant 1 each by Subdermal route once. 2013   Yes Historical Provider, MD  meloxicam (MOBIC) 15 MG tablet Take 1 tablet (15 mg total) by mouth daily. 09/14/15  Yes Titorya Stover, DPM  metroNIDAZOLE (FLAGYL) 500 MG tablet Take 500 mg by mouth 3 (three) times daily.   Yes Historical Provider, MD  vitamin E 100 UNIT capsule Take 100 Units by mouth once a week.    Yes Historical Provider, MD  ibuprofen (ADVIL,MOTRIN) 200 MG tablet Take 600 mg by mouth every 6 (six) hours as needed for mild pain or moderate pain.    Historical Provider, MD  methylPREDNISolone (MEDROL DOSEPAK) 4 MG TBPK tablet Take 1st as instructed Patient not taking: Reported on 09/26/2015 09/14/15   Landis Martins, DPM  oxyCODONE (ROXICODONE) 5 MG immediate release tablet Take 1 tablet (5 mg total) by mouth every 4 (four) hours as needed  for severe pain. Patient not taking: Reported on 09/26/2015 08/25/15   Duffy Bruce, MD    Family History History reviewed. No pertinent family history.  Social History Social History  Substance Use Topics  . Smoking status: Current Every Day Smoker    Packs/day: 0.50    Years: 16.00    Types: Cigarettes  . Smokeless tobacco: Never Used  . Alcohol use 0.0 oz/week     Comment: 4 beers per week      Allergies   Robaxin [methocarbamol] and Penicillins   Review of Systems Review of Systems  Constitutional: Negative for fever.  Gastrointestinal: Positive for abdominal pain  (diffuse). Negative for bowel incontinence.  Genitourinary: Negative for bladder incontinence and dysuria.  Musculoskeletal: Positive for back pain.  All other systems reviewed and are negative.    Physical Exam Updated Vital Signs BP 131/89 (BP Location: Right Arm)   Pulse 80   Temp 98 F (36.7 C) (Oral)   Resp 18   Ht 5' 6.5" (1.689 m)   Wt 260 lb (117.9 kg)   SpO2 98%   BMI 41.34 kg/m   Physical Exam  Constitutional: She is oriented to person, place, and time. She appears well-developed and well-nourished. No distress.  HENT:  Head: Normocephalic.  Eyes: Conjunctivae are normal.  Neck: Neck supple. No tracheal deviation present.  Cardiovascular: Normal rate, regular rhythm and normal heart sounds.   Pulmonary/Chest: Effort normal and breath sounds normal. No respiratory distress.  Abdominal: Soft. She exhibits no distension and no mass. There is tenderness (mild, diffuse with small reducible hernia around umbilicus). There is no rebound and no guarding.  Neurological: She is alert and oriented to person, place, and time.  Skin: Skin is warm and dry.  Psychiatric: She has a normal mood and affect. Her behavior is normal.  Vitals reviewed.    ED Treatments / Results  Labs (all labs ordered are listed, but only abnormal results are displayed) Labs Reviewed  COMPREHENSIVE METABOLIC PANEL - Abnormal; Notable for the following:       Result Value   Glucose, Bld 109 (*)    Calcium 8.4 (*)    Albumin 3.2 (*)    All other components within normal limits  URINALYSIS, ROUTINE W REFLEX MICROSCOPIC (NOT AT [facility]) - Abnormal; Notable for the following:    Color, Urine AMBER (*)    APPearance CLOUDY (*)    Specific Gravity, Urine >1.030 (*)    Hgb urine dipstick LARGE (*)    Bilirubin Urine MODERATE (*)    Protein, ur 100 (*)    Nitrite POSITIVE (*)    Leukocytes, UA TRACE (*)    All other components within normal limits  URINE MICROSCOPIC-ADD ON - Abnormal; Notable for the  following:    Squamous Epithelial / LPF 6-30 (*)    Bacteria, UA FEW (*)    All other components within normal limits  URINE CULTURE  LIPASE, BLOOD  CBC  I-STAT BETA HCG BLOOD, ED ([facility], [facility], [facility] ONLY)    EKG  EKG Interpretation None       Radiology No results found.  Procedures Procedures (including critical care time)  Medications Ordered in ED Medications - No data to display   Initial Impression / Assessment and Plan / ED Course  I have reviewed the triage vital signs and the nursing notes.  Pertinent labs & imaging results that were available during my care of the patient were reviewed by me and considered in my medical decision  making (see chart for details).  Clinical Course    34 y.o. female presents with periumbilical and right sided abdominal pain with pain in right lower back. No vomiting, no fever, no WBC elevation, no acute abdominal tenderness with small reducible hernia around umbilical area that she says becomes firm occasionally and causes her discomfort. Multiple recent imaging studies with questionable diverticulitis and Pt says pain is similar. Signs of possible UTI on UA with nitrites and hematuria, will send for culture and double cover for diverticulitis and UTI with cipro/flagyl in prolonged course d/t multiple recent treatments. Well appearing, return precautions discussed, I recommended she follow with her surgeon for repeat evaluation of possible small hernia if it remains symptomatic.  Final Clinical Impressions(s) / ED Diagnoses   Final diagnoses:  Lower abdominal pain  Diverticulitis of intestine without perforation or abscess without bleeding  Urinary tract infection with hematuria, site unspecified    New Prescriptions Discharge Medication List as of 09/26/2015 12:57 PM       Leo Grosser, MD 09/26/15 [phone]

## 2015-09-26 NOTE — ED Triage Notes (Signed)
Pt reports onset wed of abd pain and gas, Thursday having lower back pain and generalized abd pain. Had n/v x 1. Denies diarrhea.

## 2015-09-27 LAB — URINE CULTURE

## 2015-10-13 ENCOUNTER — Other Ambulatory Visit: Payer: Self-pay | Admitting: Sports Medicine

## 2015-10-13 DIAGNOSIS — M7989 Other specified soft tissue disorders: Secondary | ICD-10-CM

## 2015-10-14 ENCOUNTER — Telehealth: Payer: Self-pay | Admitting: *Deleted

## 2015-10-14 DIAGNOSIS — D492 Neoplasm of unspecified behavior of bone, soft tissue, and skin: Secondary | ICD-10-CM

## 2015-10-14 NOTE — Telephone Encounter (Signed)
"  We received an order for a MRI of a soft tissue lesion right foot.  We need to have that changed to MRI with and without contrast."  I will take care of it.

## 2015-10-19 ENCOUNTER — Telehealth: Payer: Self-pay | Admitting: *Deleted

## 2015-10-19 ENCOUNTER — Ambulatory Visit: Payer: Medicaid Other | Admitting: Sports Medicine

## 2015-10-19 NOTE — Telephone Encounter (Signed)
Pt states she missed her appt due to transportation, does the doctor need to see her prior to the MRI.I informed pt unless she is having a problem she could schedule after the MRI.

## 2015-10-26 ENCOUNTER — Other Ambulatory Visit: Payer: 59

## 2015-11-07 ENCOUNTER — Emergency Department (HOSPITAL_COMMUNITY)
Admission: EM | Admit: 2015-11-07 | Discharge: 2015-11-07 | Disposition: A | Discharge status: Home / Self Care | Payer: Medicaid Other | Attending: Emergency Medicine | Admitting: Emergency Medicine

## 2015-11-07 ENCOUNTER — Encounter (HOSPITAL_COMMUNITY): Payer: Self-pay | Admitting: *Deleted

## 2015-11-07 ENCOUNTER — Inpatient Hospital Stay: Admission: RE | Admit: 2015-11-07 | Payer: 59 | Source: Ambulatory Visit

## 2015-11-07 DIAGNOSIS — M545 Low back pain, unspecified: Secondary | ICD-10-CM

## 2015-11-07 DIAGNOSIS — F17210 Nicotine dependence, cigarettes, uncomplicated: Secondary | ICD-10-CM | POA: Diagnosis not present

## 2015-11-07 DIAGNOSIS — Z7982 Long term (current) use of aspirin: Secondary | ICD-10-CM | POA: Insufficient documentation

## 2015-11-07 MED ORDER — BACLOFEN 5 MG HALF TABLET
10.0000 mg | ORAL_TABLET | Freq: Once | ORAL | Status: AC
  Administered 2015-11-07: 10 mg via ORAL
  Filled 2015-11-07: qty 2

## 2015-11-07 MED ORDER — CYCLOBENZAPRINE HCL 10 MG PO TABS: 10.0000 mg | ORAL_TABLET | Freq: Once | ORAL | Status: DC

## 2015-11-07 MED ORDER — BACLOFEN 10 MG PO TABS: 10.0000 mg | ORAL_TABLET | Freq: Three times a day (TID) | ORAL | 0 refills | Status: DC

## 2015-11-07 NOTE — ED Provider Notes (Signed)
[facility] DEPT Provider Note   CSN: [id number] Arrival date & time: 11/07/15  1335     History   Chief Complaint Chief Complaint  Patient presents with  . Back Pain    HPI  Blood pressure 144/92, pulse 73, temperature 98.2 F (36.8 C), temperature source Oral, resp. rate 18, SpO2 100 %.  Tina Riggs is a 35 y.o. female complaining of Low back pain onset 3 days ago within the bilateral low back does not radiate down the legs, she rates her pain as 6 out of 10 not relieved the alleviated by over-the-counter medications. She missed getting an MRI of her right great toe to evaluate a soft tissue mass at 1:30 PM and she is requesting that we get the MRI in the ED. She also reports that she had a complicated case of diverticulitis which per she had to have multiple surgeries for this and she states the diverticulitis started with back pain. She denies any abdominal pain, nausea, vomiting, change in bowel or bladder habits, fever, incontinence to urine or stool, history of IV drug use, history of cancer, numbness, weakness, difficulty ambulating.   Past Medical History:  Diagnosis Date  . Diverticulitis    hospitalized 04/13/2014; hospitalized 04/29/2014  . HPV in female     Patient Active Problem List   Diagnosis Date Noted  . 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 Diverticulitis large intestine w/o perforation or abscess w/o bleeding 10/18/2014  . Diverticulitis of colon 04/29/2014  . Diverticulitis 04/29/2014  . Chlamydia 04/14/2014  . Trichomonal vaginitis   . Diverticulitis of colon with perforation 04/13/2014  . Bacterial vaginosis 04/13/2014  . Tobacco use 04/13/2014  . Alcohol use 04/13/2014    Past Surgical History:  Procedure Laterality Date  . CESAREAN SECTION  02/2006  . COLONOSCOPY N/A 06/11/2015   Procedure: COLONOSCOPY;  Surgeon: Leighton Ruff, MD;  Location[facility];  Service: General;  Laterality: N/A;  . COLOSTOMY N/A 05/18/2014   Procedure: COLOSTOMY;   Surgeon: Georganna Skeans, MD;  Location: [facility];  Service: General;  Laterality: N/A;  . COLOSTOMY REVISION N/A 05/18/2014   Procedure: COLON RESECTION SIGMOID;  Surgeon: Georganna Skeans, MD;  Location: [facility];  Service: General;  Laterality: N/A;  . COLPOSCOPY  Sep 2012  . LAPAROSCOPIC LYSIS OF ADHESIONS N/A 05/18/2014   Procedure: LAPAROSCOPIC MOBILIZATION OF SPLENIC FLEXURE;  Surgeon: Georganna Skeans, MD;  Location: [facility];  Service: General;  Laterality: N/A;    OB History    Gravida Para Term Preterm AB Living   1 1 1     1    SAB TAB Ectopic Multiple Live Births                   Home Medications    Prior to Admission medications   Medication Sig Start Date End Date Taking? Authorizing Provider  aspirin EC 325 MG tablet Take 975 mg by mouth daily as needed for mild pain.   Yes Historical Provider, MD  etonogestrel (NEXPLANON) 68 MG IMPL implant 1 each by Subdermal route once. 2013   Yes Historical Provider, MD  ibuprofen (ADVIL,MOTRIN) 200 MG tablet Take 600 mg by mouth every 6 (six) hours as needed for mild pain or moderate pain.   Yes Historical Provider, MD  meloxicam (MOBIC) 15 MG tablet Take 1 tablet (15 mg total) by mouth daily. 09/14/15  Yes Titorya Stover, DPM  vitamin E 100 UNIT capsule Take 100 Units by mouth once a week.    Yes  Historical Provider, MD  baclofen (LIORESAL) 10 MG tablet Take 1 tablet (10 mg total) by mouth 3 (three) times daily. 11/07/15   Ritvik Mczeal, PA-C  methylPREDNISolone (MEDROL DOSEPAK) 4 MG TBPK tablet Take 1st as instructed Patient not taking: Reported on 11/07/2015 09/14/15   Landis Martins, DPM  oxyCODONE (ROXICODONE) 5 MG immediate release tablet Take 1 tablet (5 mg total) by mouth every 6 (six) hours as needed for breakthrough pain. Patient not taking: Reported on 11/07/2015 09/26/15   Leo Grosser, MD    Family History History reviewed. No pertinent family history.  Social History Social History  Substance Use Topics  . Smoking status:  Current Every Day Smoker    Packs/day: 0.50    Years: 16.00    Types: Cigarettes  . Smokeless tobacco: Never Used  . Alcohol use 0.0 oz/week     Comment: 4 beers per week      Allergies   Robaxin [methocarbamol] and Penicillins   Review of Systems Review of Systems  10 systems reviewed and found to be negative, except as noted in the HPI.   Physical Exam Updated Vital Signs BP 144/92 (BP Location: Left Arm)   Pulse 73   Temp 98.2 F (36.8 C) (Oral)   Resp 18   SpO2 100%   Physical Exam  Constitutional: She appears well-developed and well-nourished.  Obese  HENT:  Head: Normocephalic.  Eyes: Conjunctivae are normal.  Neck: Normal range of motion.  Cardiovascular: Normal rate, regular rhythm and intact distal pulses.  Exam reveals no gallop and no friction rub.   No murmur heard. Pulmonary/Chest: Effort normal. No respiratory distress. She has no wheezes. She has no rales. She exhibits no tenderness.  Abdominal: Soft. She exhibits no distension and no mass. There is no tenderness. There is no rebound and no guarding. No hernia.  Neurological: She is alert.  No point tenderness to percussion of lumbar spinal processes.  No TTP or paraspinal muscular spasm. Strength is 5 out of 5 to bilateral lower extremities at hip and knee; extensor hallucis longus 5 out of 5. Ankle strength 5 out of 5, no clonus, neurovascularly intact. No saddle anaesthesia.       Psychiatric: She has a normal mood and affect.  Nursing note and vitals reviewed.    ED Treatments / Results  Labs (all labs ordered are listed, but only abnormal results are displayed) Labs Reviewed - No data to display  EKG  EKG Interpretation None       Radiology No results found.  Procedures Procedures (including critical care time)  Medications Ordered in ED Medications  baclofen (LIORESAL) tablet 10 mg (10 mg Oral Given 11/07/15 1452)     Initial Impression / Assessment and Plan / ED Course  I  have reviewed the triage vital signs and the nursing notes.  Pertinent labs & imaging results that were available during my care of the patient were reviewed by me and considered in my medical decision making (see chart for details).  Clinical Course     Vitals:   11/07/15 1348  BP: 144/92  Pulse: 73  Resp: 18  Temp: 98.2 F (36.8 C)  TempSrc: Oral  SpO2: 100%    Medications  baclofen (LIORESAL) tablet 10 mg (10 mg Oral Given 11/07/15 1452)    Tina Riggs is 35 y.o. female presenting with Low back pain onset 3 days ago, normal neurologic exam, no red flags. She is concerned that this may be the start of her  diverticulitis however, she has no abdominal pain no change in bowel or bladder habits. Requesting MRI of her toe, on a splinter there is no emergent indication for this. Patient will be started on baclofen at her request, case management consulted to arrange for primary care.  This is a shared visit with the attending physician who personally evaluated the patient and agrees with the care plan.   Evaluation does not show pathology that would require ongoing emergent intervention or inpatient treatment. Pt is hemodynamically stable and mentating appropriately. Discussed findings and plan with patient/guardian, who agrees with care plan. All questions answered. Return precautions discussed and outpatient follow up given.      Final Clinical Impressions(s) / ED Diagnoses   Final diagnoses:  Acute bilateral low back pain without sciatica    New Prescriptions New Prescriptions   BACLOFEN (LIORESAL) 10 MG TABLET    Take 1 tablet (10 mg total) by mouth 3 (three) times daily.     Monico Blitz, PA-C 11/07/15 1459    Orlie Dakin, MD 11/07/15 1623

## 2015-11-07 NOTE — ED Triage Notes (Signed)
Pt reports lower back pain for several days. Denies any urinary symptoms but does have hx of diverticulitis which presented with back pain in past. No relief with pain meds at home. Also had appt today for MRI of right big toe but was unable to make appt due to back pain. Requesting to get it all done today. No acute distress noted at triage.

## 2015-11-07 NOTE — ED Notes (Signed)
Pt c/o low right sided back pain x 3 days.  Denies injury.  Pain does not travel to other areas.  Family sitting at bedside said pt has diverticulitis and had abdominal pain but pt denied that at this time.  Family also asking for pt to have MRI of right great toe as she was scheduled for one today and missed her appt to come here.  Is currently being seen by podiatrist for that issue.  Pt stated she would call tomorrow to have her MRI rescheduled.

## 2015-11-07 NOTE — ED Notes (Signed)
Pt verbalized understanding discharge instructions and denies any further needs or questions at this time. VS stable, ambulatory and steady gait.   

## 2015-11-07 NOTE — ED Provider Notes (Signed)
Complains of low nonradiating back pain for the past 3 or 4 days. Pain is improved with remaining still. Made worse by changing position. No abdominal pain no fever. No loss of bladder or bowel control. On exam patient is alert and in no distress. Abdomen is morbidly obese. Entire spine is nontender. She has pain at lumbar area when sitting up from a supine position. Motor strength 5 over 5 overall DTRs symmetric bilaterally at knee jerk and ankle jerks. Toes downgoing bilaterally   Orlie Dakin, MD 11/07/15 1441

## 2015-11-07 NOTE — Discharge Instructions (Signed)
Do not hesitate to return to the Emergency Department for any new, worsening or concerning symptoms.   If you do not have a primary care doctor you can establish one at the   [facility]: [place] [id number] [phone]  After you establish care. Let them know you were seen in the emergency room. They must obtain records for further management.   Take acetaminophen (Tylenol) up to 975 mg (this is normally 3 over-the-counter pills) up to 3 times a day. Do not drink alcohol. Make sure your other medications do not contain acetaminophen (Read the labels!)

## 2015-11-07 NOTE — Care Management Note (Addendum)
Case Management Note  Patient Details  Name: MACKENZIE KOOIMAN MRN: [id number] Date of Birth: 24-Dec-1980  Subjective/Objective:                  35 y.o. F referred to [facility] for PCP referral as she needs PCP and work-up for non emergent back pain.   Action/Plan: Pt listed as MEDICAID recipient/[employer] is inactive as pt no longer employed. Pt confirmed both.  Talked with pt about ways to find PCP including calling dept of social services and asking for her assigned PCP. If this is not successful she can ask that another be assigned to her. No further CM needs at this time.   Expected Discharge Date:                  Expected Discharge Plan:     In-House Referral:     Discharge [facility]  Post Acute Care Choice:    Choice offered to:  Patient  DME Arranged:    DME Agency:     HH Arranged:    [place] Agency:     Status of Service:  Completed, signed off  If discussed at [employer] of Stay Meetings, dates discussed:    Additional Comments:  Delrae Sawyers, RN 11/07/2015, 3:27 PM

## 2015-11-15 ENCOUNTER — Inpatient Hospital Stay: Admission: RE | Admit: 2015-11-15 | Payer: 59 | Source: Ambulatory Visit

## 2015-11-22 ENCOUNTER — Other Ambulatory Visit: Payer: 59

## 2015-11-28 ENCOUNTER — Inpatient Hospital Stay: Admission: RE | Admit: 2015-11-28 | Payer: Medicaid Other | Source: Ambulatory Visit

## 2015-11-29 ENCOUNTER — Ambulatory Visit (INDEPENDENT_AMBULATORY_CARE_PROVIDER_SITE_OTHER): Payer: Medicaid Other | Admitting: Family Medicine

## 2015-11-29 ENCOUNTER — Encounter: Payer: Self-pay | Admitting: Family Medicine

## 2015-11-29 VITALS — BP 131/89 | HR 79 | Resp 16 | Ht 66.5 in | Wt 273.0 lb

## 2015-11-29 DIAGNOSIS — Z3046 Encounter for surveillance of implantable subdermal contraceptive: Secondary | ICD-10-CM | POA: Diagnosis not present

## 2015-11-29 DIAGNOSIS — Z3169 Encounter for other general counseling and advice on procreation: Secondary | ICD-10-CM | POA: Diagnosis not present

## 2015-11-29 MED ORDER — PRENATAL VITAMINS 0.8 MG PO TABS: 1.0000 | ORAL_TABLET | Freq: Every day | ORAL | 12 refills | Status: DC

## 2015-11-29 NOTE — Progress Notes (Signed)
   Subjective:    Patient ID: Tina Riggs is a 35 y.o. female presenting with remove Nexplanon  on 11/29/2015  HPI: Nexplanon in since 2013. Needs removal.  Seeking pregnancy soon. Has a 33 year old. Has regular cycles. Not on PNV.  Review of Systems  Constitutional: Negative for chills and fever.  Respiratory: Negative for shortness of breath.   Cardiovascular: Negative for chest pain.  Gastrointestinal: Negative for abdominal pain, nausea and vomiting.  Genitourinary: Negative for dysuria.  Skin: Negative for rash.      Objective:    BP 131/89   Pulse 79   Resp 16   Ht 5' 6.5" (1.689 m)   Wt 273 lb (123.8 kg)   BMI 43.40 kg/m  Physical Exam  Constitutional: She is oriented to person, place, and time. She appears well-developed and well-nourished. No distress.  HENT:  Head: Normocephalic and atraumatic.  Eyes: No scleral icterus.  Neck: Neck supple.  Cardiovascular: Normal rate.   Pulmonary/Chest: Effort normal.  Abdominal: Soft.  Neurological: She is alert and oriented to person, place, and time.  Skin: Skin is warm and dry.  Psychiatric: She has a normal mood and affect.   Procedure: Patient given informed consent for removal of her Nexplanon, time out was performed.  Signed copy in the chart.  Appropriate time out taken. Nexplanon site identified.  Area prepped in usual sterile fashon. One cc of 1% lidocaine was used to anesthetize the area at the distal end of the implant. A small stab incision was made right beside the implant on the distal portion.  The Nexplanon rod was grasped using hemostats and removed without difficulty.  There was less than 3 cc blood loss. There were no complications.  A small amount of antibiotic ointment and steri-strips were applied over the small incision.  A pressure bandage was applied to reduce any bruising.  The patient tolerated the procedure well and was given post procedure instructions.     Assessment & Plan:   Problem List  Items Addressed This Visit    None    Visit Diagnoses    Encounter for preconception consultation    -  Primary   Begin PNV's. Seek care when achieves pregnancy   Relevant Medications   Prenatal Multivit-Min-Fe-FA (PRENATAL VITAMINS) 0.8 MG tablet   Nexplanon removal          Total face-to-face time with patient: 20 minutes. Over 50% of encounter was spent on counseling and coordination of care. No Follow-up on file.  Donnamae Jude 11/29/2015 12:55 PM

## 2015-11-29 NOTE — Patient Instructions (Signed)
Preparing for Pregnancy If you are considering becoming pregnant, make an appointment to see your regular health care provider to learn how to prepare for a safe and healthy pregnancy (preconception care). During a preconception care visit, your health care provider will:  Do a complete physical exam, including a Pap test.  Take a complete medical history.  Give you information, answer your questions, and help you resolve problems.  Preconception checklist Medical history  Tell your health care provider about any current or past medical conditions. Your pregnancy or your ability to become pregnant may be affected by chronic conditions, such as diabetes, chronic hypertension, and thyroid problems.  Include your family's medical history as well as your partner's medical history.  Tell your health care provider about any history of STIs (sexually transmitted infections).These can affect your pregnancy. In some cases, they can be passed to your baby. Discuss any concerns that you have about STIs.  If indicated, discuss the benefits of genetic testing. This testing will show whether there are any genetic conditions that may be passed from you or your partner to your baby.  Tell your health care provider about: ? Any problems you have had with conception or pregnancy. ? Any medicines you take. These include vitamins, herbal supplements, and over-the-counter medicines. ? Your history of immunizations. Discuss any vaccinations that you may need.  Diet  Ask your health care provider what to include in a healthy diet that has a balance of nutrients. This is especially important when you are pregnant or preparing to become pregnant.  Ask your health care provider to help you reach a healthy weight before pregnancy. ? If you are overweight, you may be at higher risk for certain complications, such as high blood pressure, diabetes, and preterm birth. ? If you are underweight, you are more likely  to have a baby who has a low birth weight.  Lifestyle, work, and home  Let your health care provider know: ? About any lifestyle habits that you have, such as alcohol use, drug use, or smoking. ? About recreational activities that may put you at risk during pregnancy, such as downhill skiing and certain exercise programs. ? Tell your health care provider about any international travel, especially any travel to places with an active Zika virus outbreak. ? About harmful substances that you may be exposed to at work or at home. These include chemicals, pesticides, radiation, or even litter boxes. ? If you do not feel safe at home.  Mental health  Tell your health care provider about: ? Any history of mental health conditions, including feelings of depression, sadness, or anxiety. ? Any medicines that you take for a mental health condition. These include herbs and supplements.  Home instructions to prepare for pregnancy Lifestyle  Eat a balanced diet. This includes fresh fruits and vegetables, whole grains, lean meats, low-fat dairy products, healthy fats, and foods that are high in fiber. Ask to meet with a nutritionist or registered dietitian for assistance with meal planning and goals.  Get regular exercise. Try to be active for at least 30 minutes a day on most days of the week. Ask your health care provider which activities are safe during pregnancy.  Do not use any products that contain nicotine or tobacco, such as cigarettes and e-cigarettes. If you need help quitting, ask your health care provider.  Do not drink alcohol.  Do not take illegal drugs.  Maintain a healthy weight. Ask your health care provider what weight range is   right for you.  General instructions  Keep an accurate record of your menstrual periods. This makes it easier for your health care provider to determine your baby's due date.  Begin taking prenatal vitamins and folic acid supplements daily as directed by  your health care provider.  Manage any chronic conditions, such as high blood pressure and diabetes, as told by your health care provider. This is important.  How do I know that I am pregnant? You may be pregnant if you have been sexually active and you miss your period. Symptoms of early pregnancy include:  Mild cramping.  Very light vaginal bleeding (spotting).  Feeling unusually tired.  Nausea and vomiting (morning sickness).  If you have any of these symptoms and you suspect that you might be pregnant, you can take a home pregnancy test. These tests check for a hormone in your urine (human chorionic gonadotropin, or hCG). A woman's body begins to make this hormone during early pregnancy. These tests are very accurate. Wait until at least the first day after you miss your period to take one. If the test shows that you are pregnant (you get a positive result), call your health care provider to make an appointment for prenatal care. What should I do if I become pregnant?  Make an appointment with your health care provider as soon as you suspect you are pregnant.  Do not use any products that contain nicotine, such as cigarettes, chewing tobacco, and e-cigarettes. If you need help quitting, ask your health care provider.  Do not drink alcoholic beverages. Alcohol is related to a number of birth defects.  Avoid toxic odors and chemicals.  You may continue to have sexual intercourse if it does not cause pain or other problems, such as vaginal bleeding. This information is not intended to replace advice given to you by your health care provider. Make sure you discuss any questions you have with your health care provider. Document Released: 12/02/2007 Document Revised: 08/17/2015 Document Reviewed: 07/11/2015 Elsevier Interactive Patient Education  2017 [employer]  

## 2015-12-06 ENCOUNTER — Ambulatory Visit: Payer: Medicaid Other | Admitting: Certified Nurse Midwife

## 2015-12-09 ENCOUNTER — Ambulatory Visit: Admission: RE | Admit: 2015-12-09 | Payer: Medicaid Other | Source: Ambulatory Visit

## 2015-12-14 ENCOUNTER — Inpatient Hospital Stay (HOSPITAL_COMMUNITY)
Admission: AD | Admit: 2015-12-14 | Discharge: 2015-12-14 | Disposition: A | Discharge status: Home / Self Care | Payer: Medicaid Other | Source: Ambulatory Visit | Attending: Obstetrics and Gynecology | Admitting: Obstetrics and Gynecology

## 2015-12-14 ENCOUNTER — Encounter (HOSPITAL_COMMUNITY): Payer: Self-pay | Admitting: *Deleted

## 2015-12-14 DIAGNOSIS — N6320 Unspecified lump in the left breast, unspecified quadrant: Secondary | ICD-10-CM

## 2015-12-14 DIAGNOSIS — N760 Acute vaginitis: Secondary | ICD-10-CM

## 2015-12-14 DIAGNOSIS — N63 Unspecified lump in unspecified breast: Secondary | ICD-10-CM | POA: Diagnosis present

## 2015-12-14 DIAGNOSIS — Z880 Allergy status to penicillin: Secondary | ICD-10-CM | POA: Insufficient documentation

## 2015-12-14 DIAGNOSIS — N9489 Other specified conditions associated with female genital organs and menstrual cycle: Secondary | ICD-10-CM | POA: Diagnosis not present

## 2015-12-14 DIAGNOSIS — B9689 Other specified bacterial agents as the cause of diseases classified elsewhere: Secondary | ICD-10-CM

## 2015-12-14 DIAGNOSIS — Z113 Encounter for screening for infections with a predominantly sexual mode of transmission: Secondary | ICD-10-CM | POA: Diagnosis not present

## 2015-12-14 DIAGNOSIS — F17210 Nicotine dependence, cigarettes, uncomplicated: Secondary | ICD-10-CM | POA: Insufficient documentation

## 2015-12-14 LAB — URINALYSIS, ROUTINE W REFLEX MICROSCOPIC
Bacteria, UA: NONE SEEN
Bilirubin Urine: NEGATIVE
GLUCOSE, UA: NEGATIVE mg/dL
Ketones, ur: NEGATIVE mg/dL
Leukocytes, UA: NEGATIVE
NITRITE: NEGATIVE
Protein, ur: NEGATIVE mg/dL
SPECIFIC GRAVITY, URINE: 1.023 (ref 1.005–1.030)
pH: 6 (ref 5.0–8.0)

## 2015-12-14 LAB — WET PREP, GENITAL
SPERM: NONE SEEN
Trich, Wet Prep: NONE SEEN
YEAST WET PREP: NONE SEEN

## 2015-12-14 LAB — HEPATITIS B SURFACE ANTIGEN: HEP B S AG: NEGATIVE

## 2015-12-14 LAB — POCT PREGNANCY, URINE: Preg Test, Ur: NEGATIVE

## 2015-12-14 MED ORDER — METRONIDAZOLE 500 MG PO TABS: 500.0000 mg | ORAL_TABLET | Freq: Two times a day (BID) | ORAL | 0 refills | Status: DC

## 2015-12-14 NOTE — MAU Provider Note (Signed)
History     CSN: [id number]  Arrival date and time: 12/14/15 [id number]   First Provider Initiated Contact with Patient 12/14/15 1030      Chief Complaint  Patient presents with  . Breast Mass   Non-pregnatn female here with non-tender breast lumps. She first noticed them 1 week ago in left breast. She denies nipple discharge and skin changes. She also c/o intermittent lower abdominal discomfort she describes as similar to cramps. She used Ibuprofen and had some relief. She reports vaginal malodor x1 week. She denies discharge or irritation. No new partners but reports recent infidelity 2 mos ago by partner. She is requesting STD screen. She recently had Nexplanon removed and is attempting conception. She has not picked up Rx for PNV.    Pertinent Gynecological History: Menses: 12/01/15 Contraception: none Sexually transmitted diseases: past history: GC, trich Previous GYN Procedures: "froze places on my cervix" Last mammogram: never  Last pap: abnormal: ASCUS, +HPV Date: 08/2014 > no follow up   Past Medical History:  Diagnosis Date  . Diverticulitis    hospitalized 04/13/2014; hospitalized 04/29/2014  . HPV in female   . Vaginal Pap smear, abnormal     Past Surgical History:  Procedure Laterality Date  . CESAREAN SECTION  02/2006  . COLONOSCOPY N/A 06/11/2015   Procedure: COLONOSCOPY;  Surgeon: Leighton Ruff, MD;  Location: [facility];  Service: General;  Laterality: N/A;  . COLOSTOMY N/A 05/18/2014   Procedure: COLOSTOMY;  Surgeon: Georganna Skeans, MD;  Location: [place];  Service: General;  Laterality: N/A;  . COLOSTOMY REVISION N/A 05/18/2014   Procedure: COLON RESECTION SIGMOID;  Surgeon: Georganna Skeans, MD;  Location: [place];  Service: General;  Laterality: N/A;  . COLPOSCOPY  Sep 2012  . LAPAROSCOPIC LYSIS OF ADHESIONS N/A 05/18/2014   Procedure: LAPAROSCOPIC MOBILIZATION OF SPLENIC FLEXURE;  Surgeon: Georganna Skeans, MD;  Location: [place];  Service: General;  Laterality: N/A;     History reviewed. No pertinent family history.  Social History  Substance Use Topics  . Smoking status: Current Every Day Smoker    Packs/day: 0.50    Years: 16.00    Types: Cigarettes  . Smokeless tobacco: Never Used  . Alcohol use No    Allergies:  Allergies  Allergen Reactions  . Robaxin [Methocarbamol] Swelling and Other (See Comments)    Bottom lip swelled  . Penicillins Itching, Rash and Other (See Comments)    Has patient had a PCN reaction causing immediate rash, facial/tongue/throat swelling, SOB or lightheadedness with hypotension: yes Has patient had a PCN reaction causing severe rash involving mucus membranes or skin necrosis: no Has patient had a PCN reaction that required hospitalization no Has patient had a PCN reaction occurring within the last 10 years: no If all of the above answers are "NO", then may proceed with Cephalosporin use.     Prescriptions Prior to Admission  Medication Sig Dispense Refill Last Dose  . baclofen (LIORESAL) 10 MG tablet Take 1 tablet (10 mg total) by mouth 3 (three) times daily. 30 each 0 12/13/2015 at Unknown time  . meloxicam (MOBIC) 15 MG tablet Take 1 tablet (15 mg total) by mouth daily. (Patient not taking: Reported on 12/14/2015) 30 tablet 0 Not Taking at Unknown time  . Prenatal Multivit-Min-Fe-FA (PRENATAL VITAMINS) 0.8 MG tablet Take 1 tablet by mouth daily. (Patient not taking: Reported on 12/14/2015) 30 tablet 12 Not Taking at Unknown time    Review of Systems  Constitutional: Negative.   Gastrointestinal: Positive for  abdominal pain.  Genitourinary: Negative.    Physical Exam   Blood pressure 130/92, pulse 72, temperature 98.4 F (36.9 C), temperature source Oral, resp. rate 18, weight 121.9 kg (268 lb 12.8 oz), last menstrual period 12/01/2015.  Physical Exam  Constitutional: She is oriented to person, place, and time. She appears well-developed and well-nourished. No distress.  HENT:  Head: Normocephalic and  atraumatic.  Neck: Normal range of motion.  Cardiovascular: Normal rate.   Respiratory: Effort normal. Right breast exhibits no inverted nipple, no mass, no nipple discharge, no skin change and no tenderness. Left breast exhibits mass (4cm x 1cm, oval, mobile, smooth edges, non-tender). Left breast exhibits no inverted nipple, no nipple discharge, no skin change and no tenderness. Breasts are symmetrical.    GI: Soft. She exhibits no distension and no mass. There is no tenderness. There is no rebound and no guarding.  Genitourinary:  Genitourinary Comments: External: no lesions or erythema Vagina: rugated, parous, white slightly frothy vaginal discharge and scant purulent discharge from os Uterus: non enlarged, anteverted, non tender, no CMT Adnexae: no masses, no tenderness left, no tenderness right   Musculoskeletal: Normal range of motion.  Neurological: She is alert and oriented to person, place, and time.  Skin: Skin is warm and dry.  Psychiatric: She has a normal mood and affect.    MAU Course  Procedures  MDM Labs ordered and reviewed. No evidence of acute abdominal or pelvic process. I recommend she follow up with [facility] or [facility] asap for repeat pap smear as well as her primary care to manage her HTN prior to conception. Will image breast mass. Will treat BV. GC/CT pending. Stable for discharge home.   Assessment and Plan   1. Bacterial vaginosis   2. Screen for STD (sexually transmitted disease)   3. Uterine cramping   4. Breast mass in female    Discharge home Diagnostic mammo at the breast center in 1 week Follow up at [facility] or [facility] for repeat pap Follow up with [facility] as scheduled next week Return to MAU for emergencies    Medication List    STOP taking these medications   meloxicam 15 MG tablet Commonly known as:  MOBIC     TAKE these medications   baclofen 10 MG tablet Commonly known as:  LIORESAL Take 1 tablet (10 mg total) by mouth 3  (three) times daily.   metroNIDAZOLE 500 MG tablet Commonly known as:  FLAGYL Take 1 tablet (500 mg total) by mouth 2 (two) times daily.   Prenatal Vitamins 0.8 MG tablet Take 1 tablet by mouth daily.      Julianne Handler, CNM 12/14/2015, 10:31 AM

## 2015-12-14 NOTE — MAU Note (Addendum)
Had nexplanon removed end of Nov.   Noted 2 lumps in left breast when showered. Would like a pelvic.  Has had abd pain at times. Has an appt for next Tues for a papsmear.

## 2015-12-14 NOTE — Discharge Instructions (Signed)

## 2015-12-15 LAB — RPR: RPR Ser Ql: NONREACTIVE

## 2015-12-15 LAB — HIV ANTIBODY (ROUTINE TESTING W REFLEX): HIV Screen 4th Generation wRfx: NONREACTIVE

## 2015-12-15 LAB — GC/CHLAMYDIA PROBE AMP (~~LOC~~) NOT AT ARMC
CHLAMYDIA, DNA PROBE: NEGATIVE
Neisseria Gonorrhea: NEGATIVE

## 2015-12-17 ENCOUNTER — Other Ambulatory Visit: Payer: Self-pay | Admitting: Certified Nurse Midwife

## 2015-12-17 DIAGNOSIS — N6320 Unspecified lump in the left breast, unspecified quadrant: Secondary | ICD-10-CM

## 2015-12-21 ENCOUNTER — Other Ambulatory Visit: Payer: Self-pay | Admitting: Certified Nurse Midwife

## 2015-12-22 ENCOUNTER — Ambulatory Visit
Admission: RE | Admit: 2015-12-22 | Discharge: 2015-12-22 | Disposition: A | Discharge status: Home / Self Care | Payer: Medicaid Other | Source: Ambulatory Visit | Attending: Sports Medicine | Admitting: Sports Medicine

## 2015-12-22 ENCOUNTER — Other Ambulatory Visit: Payer: Medicaid Other

## 2015-12-22 DIAGNOSIS — D492 Neoplasm of unspecified behavior of bone, soft tissue, and skin: Secondary | ICD-10-CM

## 2015-12-22 MED ORDER — GADOBENATE DIMEGLUMINE 529 MG/ML IV SOLN
20.0000 mL | Freq: Once | INTRAVENOUS | Status: AC | PRN
  Administered 2015-12-22: 20 mL via INTRAVENOUS

## 2015-12-24 ENCOUNTER — Other Ambulatory Visit: Payer: Self-pay | Admitting: Certified Nurse Midwife

## 2015-12-24 ENCOUNTER — Ambulatory Visit
Admission: RE | Admit: 2015-12-24 | Discharge: 2015-12-24 | Disposition: A | Discharge status: Home / Self Care | Payer: Medicaid Other | Source: Ambulatory Visit | Attending: Certified Nurse Midwife | Admitting: Certified Nurse Midwife

## 2015-12-24 DIAGNOSIS — N6320 Unspecified lump in the left breast, unspecified quadrant: Secondary | ICD-10-CM

## 2016-01-12 ENCOUNTER — Telehealth: Payer: Self-pay | Admitting: *Deleted

## 2016-01-12 NOTE — Telephone Encounter (Signed)
Pt states she is calling about MRI of right foot. Informed pt Dr. Cannon Kettle would discuss MRI results with her at an appt. Transferred pt to schedulers.

## 2016-02-01 ENCOUNTER — Ambulatory Visit: Payer: Medicaid Other | Admitting: Sports Medicine

## 2016-02-22 ENCOUNTER — Telehealth: Payer: Self-pay | Admitting: *Deleted

## 2016-02-22 ENCOUNTER — Ambulatory Visit (INDEPENDENT_AMBULATORY_CARE_PROVIDER_SITE_OTHER): Payer: Medicaid Other | Admitting: Sports Medicine

## 2016-02-22 ENCOUNTER — Encounter: Payer: Self-pay | Admitting: Sports Medicine

## 2016-02-22 DIAGNOSIS — M779 Enthesopathy, unspecified: Secondary | ICD-10-CM

## 2016-02-22 DIAGNOSIS — D492 Neoplasm of unspecified behavior of bone, soft tissue, and skin: Secondary | ICD-10-CM

## 2016-02-22 DIAGNOSIS — M7750 Other enthesopathy of unspecified foot: Secondary | ICD-10-CM

## 2016-02-22 DIAGNOSIS — Q828 Other specified congenital malformations of skin: Secondary | ICD-10-CM

## 2016-02-22 DIAGNOSIS — M25571 Pain in right ankle and joints of right foot: Secondary | ICD-10-CM

## 2016-02-22 MED ORDER — DICLOFENAC SODIUM 75 MG PO TBEC: 75.0000 mg | DELAYED_RELEASE_TABLET | Freq: Two times a day (BID) | ORAL | 0 refills | Status: DC

## 2016-02-22 NOTE — Progress Notes (Signed)
Subjective: Tina Riggs is a 36 y.o. female patient returns to office with left foot pain at callus and right ankle and hallux pain at soft tissue mass; here for MRI results Denies any other pedal complaints.   Patient Active Problem List   Diagnosis Date Noted  . 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 Chlamydia 04/14/2014  . Diverticulitis of colon with perforation 04/13/2014  . Tobacco use 04/13/2014  . Alcohol use 04/13/2014    Current Outpatient Prescriptions on File Prior to Visit  Medication Sig Dispense Refill  . baclofen (LIORESAL) 10 MG tablet Take 1 tablet (10 mg total) by mouth 3 (three) times daily. 30 each 0  . metroNIDAZOLE (FLAGYL) 500 MG tablet Take 1 tablet (500 mg total) by mouth 2 (two) times daily. 14 tablet 0  . Prenatal Multivit-Min-Fe-FA (PRENATAL VITAMINS) 0.8 MG tablet Take 1 tablet by mouth daily. (Patient not taking: Reported on 12/14/2015) 30 tablet 12   No current facility-administered medications on file prior to visit.     Allergies  Allergen Reactions  . Robaxin [Methocarbamol] Swelling and Other (See Comments)    Bottom lip swelled  . Penicillins Itching, Rash and Other (See Comments)    Has patient had a PCN reaction causing immediate rash, facial/tongue/throat swelling, SOB or lightheadedness with hypotension: yes Has patient had a PCN reaction causing severe rash involving mucus membranes or skin necrosis: no Has patient had a PCN reaction that required hospitalization no Has patient had a PCN reaction occurring within the last 10 years: no If all of the above answers are "NO", then may proceed with Cephalosporin use.     Objective: Physical Exam General: The patient is alert and oriented x3 in no acute distress.  Dermatology: Skin is warm, dry and supple bilateral lower extremities. Nails 1-10 are short and thick. There is no erythema, edema, no eccymosis, no open lesions present. Soft tissue mass plantar hallux that is non mobile, right  measures 3x3cm, .+ callus x 4 with nucleated core plantar aspect of the left foot suggestive of porokeratosis. Dry skin plantar aspect bilateral. Integument is otherwise unremarkable.  Vascular: Dorsalis Pedis pulse and Posterior Tibial pulse are 2/4 bilateral. Capillary fill time is immediate to all digits.  Neurological: Grossly intact to light touch with an achilles reflex of +2/5 and a  negative Tinel's sign bilateral.  Musculoskeletal: Mild pain to right hallux soft tissue mass and medial ankle likely from compensation, There is no Tenderness to palpation at the medial calcaneal tubercale and through the insertion of the plantar fascia bilateral extending along arches. No pain with compression of calcaneus bilateral. No pain with tuning fork to calcaneus bilateral. No pain with calf compression bilateral. There is decreased Ankle joint range of motion bilateral. All other joints range of motion within normal limits bilateral. Mild hammertoe. Strength 5/5 in all groups bilateral.   MRI + Fibroma or tumor of tendon sheath  Assessment and Plan: Problem List Items Addressed This Visit    None    Visit Diagnoses    Soft tissue tumor    -  Primary   Porokeratosis       Acute right ankle pain       Capsulitis       Relevant Medications   diclofenac (VOLTAREN) 75 MG EC tablet     -Complete examination performed.  -MRI reviewed -Discussed with patient in detail the condition of foot pain  -Rx Diclofenac  -Recommended good supportive shoes and advised use of OTC insert. -Patient  opt for surgical management. Consent obtained for right hallux excision of soft tissue mass and left foot porokerosis. Pre and Post op course explained. Risks, benefits, alternatives explained. No guarantees given or implied. Surgical booking slip submitted and provided patient with Surgical packet and info for [place].  -To dispense CAM Walker at surgical center -Patient to return to office in after surrgery for  follow up or sooner if problems or questions arise.  Landis Martins, DPM

## 2016-02-22 NOTE — Telephone Encounter (Signed)
"  I want to schedule my surgery."  Dr. Cannon Kettle can do it on 02/28/2016 or 03/06/2016.  "Schedule me for February 26."  I will get it scheduled.  Someone from the surgical center will call you with the arrival time on Friday.  You can register now with the surgical center, instructions are in the brochure that was given to you on the blue page.

## 2016-02-22 NOTE — Patient Instructions (Signed)
Pre-Operative Instructions  Congratulations, you have decided to take an important step to improving your quality of life.  You can be assured that the doctors of [facility] will be with you every step of the way.  1. Plan to be at the surgery center/hospital at least 1 (one) hour prior to your scheduled time unless otherwise directed by the surgical center/hospital staff.  You must have a responsible adult accompany you, remain during the surgery and drive you home.  Make sure you have directions to the surgical center/hospital and know how to get there on time. 2. For hospital based surgery you will need to obtain a history and physical form from your family physician within 1 month prior to the date of surgery- we will give you a form for you primary physician.  3. We make every effort to accommodate the date you request for surgery.  There are however, times where surgery dates or times have to be moved.  We will contact you as soon as possible if a change in schedule is required.   4. No Aspirin/Ibuprofen for one week before surgery.  If you are on aspirin, any non-steroidal anti-inflammatory medications (Mobic, Aleve, Ibuprofen) you should stop taking it 7 days prior to your surgery.  You make take Tylenol  For pain prior to surgery.  5. Medications- If you are taking daily heart and blood pressure medications, seizure, reflux, allergy, asthma, anxiety, pain or diabetes medications, make sure the surgery center/hospital is aware before the day of surgery so they may notify you which medications to take or avoid the day of surgery. 6. No food or drink after midnight the night before surgery unless directed otherwise by surgical center/hospital staff. 7. No alcoholic beverages 24 hours prior to surgery.  No smoking 24 hours prior to or 24 hours after surgery. 8. Wear loose pants or shorts- loose enough to fit over bandages, boots, and casts. 9. No slip on shoes, sneakers are best. 10. Bring  your boot with you to the surgery center/hospital.  Also bring crutches or a walker if your physician has prescribed it for you.  If you do not have this equipment, it will be provided for you after surgery. 11. If you have not been contracted by the surgery center/hospital by the day before your surgery, call to confirm the date and time of your surgery. 12. Leave-time from work may vary depending on the type of surgery you have.  Appropriate arrangements should be made prior to surgery with your employer. 13. Prescriptions will be provided immediately following surgery by your doctor.  Have these filled as soon as possible after surgery and take the medication as directed. 14. Remove nail polish on the operative foot. 15. Wash the night before surgery.  The night before surgery wash the foot and leg well with the antibacterial soap provided and water paying special attention to beneath the toenails and in between the toes.  Rinse thoroughly with water and dry well with a towel.  Perform this wash unless told not to do so by your physician.  Enclosed: 1 Ice pack (please put in freezer the night before surgery)   1 Hibiclens skin cleaner   Pre-op Instructions  If you have any questions regarding the instructions, do not hesitate to call our office.  [facility]: [address] [place], [place] [address] [phone]  [place]: [address] , [place] [address] [phone]  [place]: [address]  [place], [place] [address] [phone]   Dr.   Norman Regal DPM, Dr. Matthew Wagoner DPM, Dr. M. Todd Hyatt DPM, Dr. Ziggy Chanthavong DPM 

## 2016-02-28 ENCOUNTER — Encounter: Payer: Self-pay | Admitting: Sports Medicine

## 2016-02-28 DIAGNOSIS — D492 Neoplasm of unspecified behavior of bone, soft tissue, and skin: Secondary | ICD-10-CM | POA: Diagnosis not present

## 2016-02-29 ENCOUNTER — Telehealth: Payer: Self-pay | Admitting: Sports Medicine

## 2016-02-29 NOTE — Telephone Encounter (Signed)
Post op phone call made to patient. Patient reports that she is doing ok; so far has taken 4 oxycodone and 2 tylenol. States that she got her pain meds late around 8 pm on yesterday. Report otherwise she is doing ok. Patient had a few ?s about the dressing. I advised patient to keep the dressing clean, dry, and intact and to ice both feet as instructed. Patient to follow up in office with me on next week as scheduled or sooner if problems or issues arise. -Dr. Cannon Kettle

## 2016-03-02 ENCOUNTER — Encounter: Payer: Self-pay | Admitting: Sports Medicine

## 2016-03-07 ENCOUNTER — Ambulatory Visit (INDEPENDENT_AMBULATORY_CARE_PROVIDER_SITE_OTHER): Payer: Medicaid Other | Admitting: Sports Medicine

## 2016-03-07 ENCOUNTER — Ambulatory Visit (INDEPENDENT_AMBULATORY_CARE_PROVIDER_SITE_OTHER): Payer: Medicaid Other

## 2016-03-07 DIAGNOSIS — Z9889 Other specified postprocedural states: Secondary | ICD-10-CM

## 2016-03-07 DIAGNOSIS — M7750 Other enthesopathy of unspecified foot: Secondary | ICD-10-CM

## 2016-03-07 DIAGNOSIS — Q828 Other specified congenital malformations of skin: Secondary | ICD-10-CM

## 2016-03-07 DIAGNOSIS — D492 Neoplasm of unspecified behavior of bone, soft tissue, and skin: Secondary | ICD-10-CM

## 2016-03-07 NOTE — Progress Notes (Signed)
Subjective: Tina Riggs is a 36 y.o. female patient seen today in office for POV #1 (DOS 02-28-16), S/P Right excision of soft tissue tumor right 1st toe and excision of benign skin lesion plantar left foot. Patient denies pain at surgical site, denies calf pain, denies headache, chest pain, shortness of breath, nausea, vomiting, fever, or chills. Patient states that she is doing well and is only taking Ibuprofen when needed. Completed pain meds. No other issues noted.   Patient Active Problem List   Diagnosis Date Noted  . 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 Chlamydia 04/14/2014  . Diverticulitis of colon with perforation 04/13/2014  . Tobacco use 04/13/2014  . Alcohol use 04/13/2014    Current Outpatient Prescriptions on File Prior to Visit  Medication Sig Dispense Refill  . baclofen (LIORESAL) 10 MG tablet Take 1 tablet (10 mg total) by mouth 3 (three) times daily. 30 each 0  . diclofenac (VOLTAREN) 75 MG EC tablet Take 1 tablet (75 mg total) by mouth 2 (two) times daily. 30 tablet 0  . metroNIDAZOLE (FLAGYL) 500 MG tablet Take 1 tablet (500 mg total) by mouth 2 (two) times daily. 14 tablet 0  . Prenatal Multivit-Min-Fe-FA (PRENATAL VITAMINS) 0.8 MG tablet Take 1 tablet by mouth daily. (Patient not taking: Reported on 12/14/2015) 30 tablet 12   No current facility-administered medications on file prior to visit.     Allergies  Allergen Reactions  . Robaxin [Methocarbamol] Swelling and Other (See Comments)    Bottom lip swelled  . Penicillins Itching, Rash and Other (See Comments)    Has patient had a PCN reaction causing immediate rash, facial/tongue/throat swelling, SOB or lightheadedness with hypotension: yes Has patient had a PCN reaction causing severe rash involving mucus membranes or skin necrosis: no Has patient had a PCN reaction that required hospitalization no Has patient had a PCN reaction occurring within the last 10 years: no If all of the above answers are "NO",  then may proceed with Cephalosporin use.     Objective: There were no vitals filed for this visit.  General: No acute distress, AAOx3  Right and Left foot: Sutures intact with no gapping or dehiscence at surgical sites, mild swelling to surgical sites right and left foot, no erythema, no warmth, no drainage, no signs of infection noted, Capillary fill time <3 seconds in all digits, gross sensation present via light touch to right and left foot. No pain or crepitation with range of motion right and left foot.  No pain with calf compression.   Post Op Xray, Right and Left foot: Unchanged bunion and hammertoe, Soft tissue swelling within normal limits for post op status.   Path- Epidermal cyst on right and verrucae on left  Assessment and Plan:  Problem List Items Addressed This Visit    None    Visit Diagnoses    S/P foot surgery, left    -  Primary   Relevant Orders   DG Foot Complete Left (Completed)   S/P foot surgery, right       Relevant Orders   DG Foot Complete Right (Completed)   Soft tissue tumor       Porokeratosis          -Patient seen and evaluated -Xrays reviewed; no acute findings  -Applied dry sterile dressing to surgical site right and left foot secured with ACE wrap and stockinet  -Advised patient to make sure to keep dressings clean, dry, and intact to left and right surgical sites, removing  the ACE as needed  -Advised patient to continue with post-op shoe on left and right foot   -Advised patient to limit activity to necessity  -Advised patient to ice and elevate as necessary  -Will plan for suture removal if ready at next office visit. In the meantime, patient to call office if any issues or problems arise.   Landis Martins, DPM

## 2016-03-14 ENCOUNTER — Encounter: Payer: Medicaid Other | Admitting: Sports Medicine

## 2016-03-17 ENCOUNTER — Encounter: Payer: Medicaid Other | Admitting: Podiatry

## 2016-03-20 ENCOUNTER — Ambulatory Visit (INDEPENDENT_AMBULATORY_CARE_PROVIDER_SITE_OTHER): Payer: Self-pay | Admitting: Sports Medicine

## 2016-03-20 DIAGNOSIS — M799 Soft tissue disorder, unspecified: Secondary | ICD-10-CM

## 2016-03-20 DIAGNOSIS — D492 Neoplasm of unspecified behavior of bone, soft tissue, and skin: Secondary | ICD-10-CM

## 2016-03-20 DIAGNOSIS — Q828 Other specified congenital malformations of skin: Secondary | ICD-10-CM

## 2016-03-20 DIAGNOSIS — M7989 Other specified soft tissue disorders: Secondary | ICD-10-CM

## 2016-03-20 DIAGNOSIS — Z9889 Other specified postprocedural states: Secondary | ICD-10-CM

## 2016-03-23 NOTE — Progress Notes (Signed)
Nursing note reviewed -Dr. Cannon Kettle

## 2016-03-23 NOTE — Progress Notes (Signed)
Pt presents s/p Rt excision of soft tissue tumor 1st toe and benign skin lesion Lt foot DOS 2.26.18  No s/s of infection noted, no redness, no erythema, no drainage from surgical sites. Incision lines aligned and approximated, all sutures removed, no new gapping noted.   She denied any s/s of infection. Sterile gauze applied to feet. Re-appointed in 1 week to see Dr Cannon Kettle

## 2016-03-28 ENCOUNTER — Ambulatory Visit (INDEPENDENT_AMBULATORY_CARE_PROVIDER_SITE_OTHER): Payer: Medicaid Other | Admitting: Sports Medicine

## 2016-03-28 ENCOUNTER — Encounter: Payer: Self-pay | Admitting: Sports Medicine

## 2016-03-28 DIAGNOSIS — Z9889 Other specified postprocedural states: Secondary | ICD-10-CM

## 2016-03-28 NOTE — Progress Notes (Signed)
  Subjective: Tina Riggs is a 36 y.o. female patient seen today in office for POV #3 (DOS 02-28-16), S/P Right excision of soft tissue tumor right 1st toe and excision of benign skin lesion plantar left foot. Patient denies pain at surgical site, denies calf pain, denies headache, chest pain, shortness of breath, nausea, vomiting, fever, or chills. Patient states that she is doing well and is using post op shoes. No other issues noted.   Patient Active Problem List   Diagnosis Date Noted  . 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 Chlamydia 04/14/2014  . Diverticulitis of colon with perforation 04/13/2014  . Tobacco use 04/13/2014  . Alcohol use 04/13/2014    Current Outpatient Prescriptions on File Prior to Visit  Medication Sig Dispense Refill  . baclofen (LIORESAL) 10 MG tablet Take 1 tablet (10 mg total) by mouth 3 (three) times daily. 30 each 0  . diclofenac (VOLTAREN) 75 MG EC tablet Take 1 tablet (75 mg total) by mouth 2 (two) times daily. 30 tablet 0  . metroNIDAZOLE (FLAGYL) 500 MG tablet Take 1 tablet (500 mg total) by mouth 2 (two) times daily. 14 tablet 0  . Prenatal Multivit-Min-Fe-FA (PRENATAL VITAMINS) 0.8 MG tablet Take 1 tablet by mouth daily. (Patient not taking: Reported on 12/14/2015) 30 tablet 12   No current facility-administered medications on file prior to visit.     Allergies  Allergen Reactions  . Robaxin [Methocarbamol] Swelling and Other (See Comments)    Bottom lip swelled  . Penicillins Itching, Rash and Other (See Comments)    Has patient had a PCN reaction causing immediate rash, facial/tongue/throat swelling, SOB or lightheadedness with hypotension: yes Has patient had a PCN reaction causing severe rash involving mucus membranes or skin necrosis: no Has patient had a PCN reaction that required hospitalization no Has patient had a PCN reaction occurring within the last 10 years: no If all of the above answers are "NO", then may proceed with Cephalosporin  use.     Objective: There were no vitals filed for this visit.  General: No acute distress, AAOx3  Right and Left foot: Incisions healing well, intact with no gapping or dehiscence at surgical sites, mild swelling to surgical sites right and left foot, no erythema, no warmth, no drainage, no signs of infection noted, Capillary fill time <3 seconds in all digits, gross sensation present via light touch to right and left foot. No pain or crepitation with range of motion right and left foot.  No pain with calf compression.   Assessment and Plan:  Problem List Items Addressed This Visit    None    Visit Diagnoses    S/P foot surgery, right    -  Primary   S/P foot surgery, left          -Patient seen and evaluated -Encourage Mositurizing cream to dry incision sites and to refrain trimming off any scabs; let them fall off with bath or shower -May return to normal shoes -Advised patient to limit activity to tolerance -Advised patient to ice and elevate as necessary  -Will plan for post of check of incisional healing at next office visit. In the meantime, patient to call office if any issues or problems arise.   Landis Martins, DPM

## 2016-04-25 ENCOUNTER — Encounter: Payer: Medicaid Other | Admitting: Sports Medicine

## 2016-04-26 NOTE — Progress Notes (Signed)
Please disregard encounter. Patient was a no show. -Dr. Cannon Kettle  This encounter was created in error - please disregard.

## 2016-06-08 NOTE — Progress Notes (Signed)
1. Excision of soft tissue mass right great toe 2. Excise/cut out painful callus/keratosis left foot

## 2016-06-18 ENCOUNTER — Emergency Department (HOSPITAL_COMMUNITY)
Admission: EM | Admit: 2016-06-18 | Discharge: 2016-06-18 | Disposition: A | Discharge status: Home / Self Care | Payer: Medicaid Other | Attending: Emergency Medicine | Admitting: Emergency Medicine

## 2016-06-18 ENCOUNTER — Encounter (HOSPITAL_COMMUNITY): Payer: Self-pay | Admitting: Emergency Medicine

## 2016-06-18 DIAGNOSIS — F17210 Nicotine dependence, cigarettes, uncomplicated: Secondary | ICD-10-CM | POA: Diagnosis not present

## 2016-06-18 DIAGNOSIS — J029 Acute pharyngitis, unspecified: Secondary | ICD-10-CM | POA: Insufficient documentation

## 2016-06-18 DIAGNOSIS — J069 Acute upper respiratory infection, unspecified: Secondary | ICD-10-CM

## 2016-06-18 LAB — RAPID STREP SCREEN (MED CTR MEBANE ONLY): STREPTOCOCCUS, GROUP A SCREEN (DIRECT): NEGATIVE

## 2016-06-18 NOTE — ED Provider Notes (Signed)
[facility] [place] DEPT Provider Note   CSN: [id number] Arrival date & time: 06/18/16  8185     History   Chief Complaint Chief Complaint  Patient presents with  . Sore Throat    HPI Tina Riggs is a 36 y.o. female.  Patient is a 36 year old female who presents with sore throat. She states that the last 5-6 days she's had a runny nose congestion and postnasal drip. She also has a sore throat. She's coughing up some thick mucus mostly out of her throat. She denies any significant chest congestion. No shortness of breath. No fevers although she has had some chills. She has some diffuse myalgias. No vomiting or diarrhea. She's been using ibuprofen and occasionally Robitussin without improvement in symptoms. She's does say that drinking hot soup helps.      Past Medical History:  Diagnosis Date  . Diverticulitis    hospitalized 04/13/2014; hospitalized 04/29/2014  . HPV in female   . Vaginal Pap smear, abnormal     Patient Active Problem List   Diagnosis Date Noted  . 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 Chlamydia 04/14/2014  . Diverticulitis of colon with perforation 04/13/2014  . Tobacco use 04/13/2014  . Alcohol use 04/13/2014    Past Surgical History:  Procedure Laterality Date  . CESAREAN SECTION  02/2006  . COLONOSCOPY N/A 06/11/2015   Procedure: COLONOSCOPY;  Surgeon: Leighton Ruff, MD;  Location: [facility];  Service: General;  Laterality: N/A;  . COLOSTOMY N/A 05/18/2014   Procedure: COLOSTOMY;  Surgeon: Georganna Skeans, MD;  Location: [facility];  Service: General;  Laterality: N/A;  . COLOSTOMY REVISION N/A 05/18/2014   Procedure: COLON RESECTION SIGMOID;  Surgeon: Georganna Skeans, MD;  Location: [place];  Service: General;  Laterality: N/A;  . COLPOSCOPY  Sep 2012  . LAPAROSCOPIC LYSIS OF ADHESIONS N/A 05/18/2014   Procedure: LAPAROSCOPIC MOBILIZATION OF SPLENIC FLEXURE;  Surgeon: Georganna Skeans, MD;  Location: [facility];  Service: General;  Laterality: N/A;    OB History    Gravida Para Term Preterm AB Living   1 1 1     1    SAB TAB Ectopic Multiple Live Births                   Home Medications    Prior to Admission medications   Medication Sig Start Date End Date Taking? Authorizing Provider  baclofen (LIORESAL) 10 MG tablet Take 1 tablet (10 mg total) by mouth 3 (three) times daily. 11/07/15   Pisciotta, Elmyra Ricks, PA-C  clindamycin (CLEOCIN) 300 MG capsule Take 300 mg by mouth 4 (four) times daily.    Landis Martins, DPM  diclofenac (VOLTAREN) 75 MG EC tablet Take 1 tablet (75 mg total) by mouth 2 (two) times daily. 02/22/16   Landis Martins, DPM  metroNIDAZOLE (FLAGYL) 500 MG tablet Take 1 tablet (500 mg total) by mouth 2 (two) times daily. 12/14/15   Julianne Handler, CNM  oxycodone (OXY-IR) 5 MG capsule Take 5 mg by mouth every 4 (four) hours as needed.    Landis Martins, DPM  Prenatal Multivit-Min-Fe-FA (PRENATAL VITAMINS) 0.8 MG tablet Take 1 tablet by mouth daily. Patient not taking: Reported on 12/14/2015 11/29/15   Donnamae Jude, MD  promethazine (PHENERGAN) 25 MG tablet Take 25 mg by mouth every 6 (six) hours as needed for nausea or vomiting.    [provider]    Family History No family history on file.  Social History Social History  Substance Use Topics  . Smoking  status: Current Every Day Smoker    Packs/day: 0.50    Years: 16.00    Types: Cigarettes  . Smokeless tobacco: Never Used  . Alcohol use No     Allergies   Robaxin [methocarbamol] and Penicillins   Review of Systems Review of Systems  Constitutional: Positive for chills and fatigue. Negative for diaphoresis and fever.  HENT: Positive for congestion, postnasal drip, rhinorrhea and sore throat. Negative for sneezing.   Eyes: Negative.   Respiratory: Positive for cough. Negative for chest tightness and shortness of breath.   Cardiovascular: Negative for chest pain and leg swelling.  Gastrointestinal: Negative for abdominal pain, blood in stool, diarrhea,  nausea and vomiting.  Genitourinary: Negative for difficulty urinating, flank pain, frequency and hematuria.  Musculoskeletal: Positive for myalgias. Negative for arthralgias and back pain.  Skin: Negative for rash.  Neurological: Negative for dizziness, speech difficulty, weakness, numbness and headaches.     Physical Exam Updated Vital Signs BP (!) 144/106 (BP Location: Left Arm)   Pulse 76   Temp 97.6 F (36.4 C) (Oral)   Resp 18   Ht 5' 6.5" (1.689 m)   Wt 113.4 kg (250 lb)   LMP 06/04/2016 (Exact Date)   SpO2 100%   BMI 39.75 kg/m   Physical Exam  Constitutional: She is oriented to person, place, and time. She appears well-developed and well-nourished.  HENT:  Head: Normocephalic and atraumatic.  Right Ear: External ear normal.  Left Ear: External ear normal.  Mouth/Throat: Oropharynx is clear and moist. No oropharyngeal exudate.  No trismus, uvula is midline, no elevation of the tongue  Eyes: Pupils are equal, round, and reactive to light.  Neck: Normal range of motion. Neck supple.  Cardiovascular: Normal rate, regular rhythm and normal heart sounds.   Pulmonary/Chest: Effort normal and breath sounds normal. No respiratory distress. She has no wheezes. She has no rales. She exhibits no tenderness.  Abdominal: Soft. Bowel sounds are normal. There is no tenderness. There is no rebound and no guarding.  Musculoskeletal: Normal range of motion. She exhibits no edema.  Lymphadenopathy:    She has no cervical adenopathy.  Neurological: She is alert and oriented to person, place, and time.  Skin: Skin is warm and dry. No rash noted.  Psychiatric: She has a normal mood and affect.     ED Treatments / Results  Labs (all labs ordered are listed, but only abnormal results are displayed) Labs Reviewed  RAPID STREP SCREEN (NOT AT [facility])  CULTURE, GROUP A STREP [facility])    EKG  EKG Interpretation None       Radiology No results found.  Procedures Procedures  (including critical care time)  Medications Ordered in ED Medications - No data to display   Initial Impression / Assessment and Plan / ED Course  I have reviewed the triage vital signs and the nursing notes.  Pertinent labs & imaging results that were available during my care of the patient were reviewed by me and considered in my medical decision making (see chart for details).     Patient strep test is negative. She has symptoms that are most consistent with URI symptoms. Her lungs are clear without suggestions of pneumonia. I don't see any other evidence of bacterial infection. She was discharged home in good condition. She was given symptomatic care instructions. She was encouraged to follow-up with her PCP if her symptoms are not improving or return here as needed for any worsening symptoms. Her blood pressure is elevated  and will need to be rechecked by her PCP.  Final Clinical Impressions(s) / ED Diagnoses   Final diagnoses:  Viral upper respiratory tract infection    New Prescriptions New Prescriptions   No medications on file     Malvin Johns, MD 06/18/16 [phone]

## 2016-06-18 NOTE — ED Triage Notes (Signed)
Reports sore throat since Tuesday with some nasal congestion.  Also reports coughing up thick mucous.

## 2016-06-18 NOTE — ED Notes (Signed)
Patient waiting on discharge paper.  Strep screen negative.

## 2016-06-18 NOTE — ED Notes (Signed)
Attempted to give discharge instructions and papers to the patient.  Patient is no longer in their bed.  Holding onto paper if they return.  Will discharge patient from system at this time.

## 2016-06-20 LAB — CULTURE, GROUP A STREP (THRC)

## 2016-08-31 ENCOUNTER — Encounter (HOSPITAL_COMMUNITY): Payer: Self-pay

## 2016-08-31 ENCOUNTER — Emergency Department (HOSPITAL_COMMUNITY)
Admission: EM | Admit: 2016-08-31 | Discharge: 2016-08-31 | Disposition: A | Discharge status: Home / Self Care | Payer: Medicaid Other | Attending: Emergency Medicine | Admitting: Emergency Medicine

## 2016-08-31 DIAGNOSIS — R109 Unspecified abdominal pain: Secondary | ICD-10-CM | POA: Insufficient documentation

## 2016-08-31 DIAGNOSIS — Z79899 Other long term (current) drug therapy: Secondary | ICD-10-CM | POA: Insufficient documentation

## 2016-08-31 DIAGNOSIS — F17210 Nicotine dependence, cigarettes, uncomplicated: Secondary | ICD-10-CM | POA: Insufficient documentation

## 2016-08-31 LAB — CBC
HCT: 39.1 % (ref 36.0–46.0)
Hemoglobin: 12.6 g/dL (ref 12.0–15.0)
MCH: 29.2 pg (ref 26.0–34.0)
MCHC: 32.2 g/dL (ref 30.0–36.0)
MCV: 90.7 fL (ref 78.0–100.0)
Platelets: 321 10*3/uL (ref 150–400)
RBC: 4.31 MIL/uL (ref 3.87–5.11)
RDW: 14.4 % (ref 11.5–15.5)
WBC: 10.8 10*3/uL — ABNORMAL HIGH (ref 4.0–10.5)

## 2016-08-31 LAB — COMPREHENSIVE METABOLIC PANEL
ALT: 15 U/L (ref 14–54)
AST: 17 U/L (ref 15–41)
Albumin: 3.3 g/dL — ABNORMAL LOW (ref 3.5–5.0)
Alkaline Phosphatase: 91 U/L (ref 38–126)
Anion gap: 8 (ref 5–15)
BUN: 13 mg/dL (ref 6–20)
CO2: 25 mmol/L (ref 22–32)
Calcium: 8.6 mg/dL — ABNORMAL LOW (ref 8.9–10.3)
Chloride: 105 mmol/L (ref 101–111)
Creatinine, Ser: 0.8 mg/dL (ref 0.44–1.00)
GFR calc Af Amer: >60 mL/min (ref >60)
GFR calc non Af Amer: >60 mL/min (ref >60)
Glucose, Bld: 118 mg/dL — ABNORMAL HIGH (ref 65–99)
Potassium: 3.5 mmol/L (ref 3.5–5.1)
Sodium: 138 mmol/L (ref 135–145)
Total Bilirubin: 0.6 mg/dL (ref 0.3–1.2)
Total Protein: 6.8 g/dL (ref 6.5–8.1)

## 2016-08-31 LAB — URINALYSIS, ROUTINE W REFLEX MICROSCOPIC
Bacteria, UA: NONE SEEN
Bilirubin Urine: NEGATIVE
Glucose, UA: NEGATIVE mg/dL
Ketones, ur: NEGATIVE mg/dL
Leukocytes, UA: NEGATIVE
Nitrite: NEGATIVE
Protein, ur: NEGATIVE mg/dL
Specific Gravity, Urine: 1.018 (ref 1.005–1.030)
pH: 6 (ref 5.0–8.0)

## 2016-08-31 LAB — LIPASE, BLOOD: Lipase: 20 U/L (ref 11–51)

## 2016-08-31 MED ORDER — METRONIDAZOLE 500 MG PO TABS
500.0000 mg | ORAL_TABLET | Freq: Once | ORAL | Status: AC
  Administered 2016-08-31: 500 mg via ORAL
  Filled 2016-08-31: qty 1

## 2016-08-31 MED ORDER — CIPROFLOXACIN HCL 500 MG PO TABS
500.0000 mg | ORAL_TABLET | Freq: Once | ORAL | Status: AC
  Administered 2016-08-31: 500 mg via ORAL
  Filled 2016-08-31: qty 1

## 2016-08-31 MED ORDER — OXYCODONE-ACETAMINOPHEN 5-325 MG PO TABS
1.0000 | ORAL_TABLET | Freq: Once | ORAL | Status: AC
  Administered 2016-08-31: 1 via ORAL
  Filled 2016-08-31: qty 1

## 2016-08-31 MED ORDER — ONDANSETRON HCL 4 MG PO TABS: 4.0000 mg | ORAL_TABLET | Freq: Three times a day (TID) | ORAL | 0 refills | Status: DC | PRN

## 2016-08-31 MED ORDER — CIPROFLOXACIN 500 MG/5ML (10%) PO SUSR: 500.0000 mg | Freq: Two times a day (BID) | ORAL | 0 refills | Status: DC

## 2016-08-31 MED ORDER — CIPROFLOXACIN HCL 500 MG PO TABS: 500.0000 mg | ORAL_TABLET | Freq: Two times a day (BID) | ORAL | 0 refills | Status: DC

## 2016-08-31 MED ORDER — IBUPROFEN 400 MG PO TABS
600.0000 mg | ORAL_TABLET | Freq: Once | ORAL | Status: DC
  Filled 2016-08-31: qty 1

## 2016-08-31 MED ORDER — METRONIDAZOLE 500 MG PO TABS: 500.0000 mg | ORAL_TABLET | Freq: Three times a day (TID) | ORAL | 0 refills | Status: DC

## 2016-08-31 MED ORDER — TRAMADOL HCL 50 MG PO TABS: 50.0000 mg | ORAL_TABLET | Freq: Four times a day (QID) | ORAL | 0 refills | Status: DC | PRN

## 2016-08-31 NOTE — ED Notes (Signed)
Pt called for triage x 2 without answer

## 2016-08-31 NOTE — ED Notes (Signed)
Pt drinking soda in triage

## 2016-08-31 NOTE — ED Notes (Signed)
Pt does not respond when called for triage x 2

## 2016-08-31 NOTE — ED Notes (Signed)
Patient presents  To ed states she wants to be checked for diverticulitis c/o abd. Pain x 1 week. C\o vomiting x 1 , also c/o painful intercourse and "wants that checked too".

## 2016-08-31 NOTE — Discharge Instructions (Signed)
You are being treated for presumed diverticulitis. Take antibiotics until finished.

## 2016-08-31 NOTE — ED Provider Notes (Signed)
[place] DEPT Provider Note   CSN: [id number] Arrival date & time: 08/31/16  0245     History   Chief Complaint Chief Complaint  Patient presents with  . Abdominal Pain    HPI Tina Riggs is a 36 y.o. female.  HPI   36 year old female with multiple complaints, but primarily abdominal pain. She's concerned for possible diverticulitis. She has a history the same. Pain began about a week ago. Initially waxing and waning been last 2 days has been constant. No appreciable exacerbating relieving factors. Nausea and vomited once yesterday. Stools loose. Unusual vaginal bleeding or discharge. No urinary complaints.  Past Medical History:  Diagnosis Date  . Diverticulitis    hospitalized 04/13/2014; hospitalized 04/29/2014  . HPV in female   . Vaginal Pap smear, abnormal     Patient Active Problem List   Diagnosis Date Noted  . 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 Chlamydia 04/14/2014  . Diverticulitis of colon with perforation 04/13/2014  . Tobacco use 04/13/2014  . Alcohol use 04/13/2014    Past Surgical History:  Procedure Laterality Date  . CESAREAN SECTION  02/2006  . COLONOSCOPY N/A 06/11/2015   Procedure: COLONOSCOPY;  Surgeon: Leighton Ruff, MD;  Location: [facility];  Service: General;  Laterality: N/A;  . COLOSTOMY N/A 05/18/2014   Procedure: COLOSTOMY;  Surgeon: Georganna Skeans, MD;  Location: [facility];  Service: General;  Laterality: N/A;  . COLOSTOMY REVISION N/A 05/18/2014   Procedure: COLON RESECTION SIGMOID;  Surgeon: Georganna Skeans, MD;  Location: [place];  Service: General;  Laterality: N/A;  . COLPOSCOPY  Sep 2012  . LAPAROSCOPIC LYSIS OF ADHESIONS N/A 05/18/2014   Procedure: LAPAROSCOPIC MOBILIZATION OF SPLENIC FLEXURE;  Surgeon: Georganna Skeans, MD;  Location: [place];  Service: General;  Laterality: N/A;    OB History    Gravida Para Term Preterm AB Living   1 1 1     1    SAB TAB Ectopic Multiple Live Births                   Home Medications    Prior to  Admission medications   Medication Sig Start Date End Date Taking? Authorizing Provider  baclofen (LIORESAL) 10 MG tablet Take 1 tablet (10 mg total) by mouth 3 (three) times daily. 11/07/15   Pisciotta, Elmyra Ricks, PA-C  clindamycin (CLEOCIN) 300 MG capsule Take 300 mg by mouth 4 (four) times daily.    Landis Martins, DPM  diclofenac (VOLTAREN) 75 MG EC tablet Take 1 tablet (75 mg total) by mouth 2 (two) times daily. 02/22/16   Landis Martins, DPM  metroNIDAZOLE (FLAGYL) 500 MG tablet Take 1 tablet (500 mg total) by mouth 2 (two) times daily. 12/14/15   Julianne Handler, CNM  oxycodone (OXY-IR) 5 MG capsule Take 5 mg by mouth every 4 (four) hours as needed.    Landis Martins, DPM  Prenatal Multivit-Min-Fe-FA (PRENATAL VITAMINS) 0.8 MG tablet Take 1 tablet by mouth daily. Patient not taking: Reported on 12/14/2015 11/29/15   Donnamae Jude, MD  promethazine (PHENERGAN) 25 MG tablet Take 25 mg by mouth every 6 (six) hours as needed for nausea or vomiting.    [provider]    Family History No family history on file.  Social History Social History  Substance Use Topics  . Smoking status: Current Every Day Smoker    Packs/day: 0.50    Years: 16.00    Types: Cigarettes  . Smokeless tobacco: Never Used  . Alcohol use No  Allergies   Robaxin [methocarbamol] and Penicillins   Review of Systems Review of Systems  All systems reviewed and negative, other than as noted in HPI.  Physical Exam Updated Vital Signs BP 120/80   Pulse 69   Temp 98.2 F (36.8 C)   Resp 18   SpO2 99%   Physical Exam  Constitutional: She appears well-developed and well-nourished. No distress.  HENT:  Head: Normocephalic and atraumatic.  Eyes: Conjunctivae are normal. Right eye exhibits no discharge. Left eye exhibits no discharge.  Neck: Neck supple.  Cardiovascular: Normal rate, regular rhythm and normal heart sounds.  Exam reveals no gallop and no friction rub.   No murmur  heard. Pulmonary/Chest: Effort normal and breath sounds normal. No respiratory distress.  Abdominal: Soft. She exhibits no distension. There is no tenderness.  Mild tenderness to palpation left the suprapubic region and left lower quadrant without rebound or guarding.  Musculoskeletal: She exhibits no edema or tenderness.  Neurological: She is alert.  Skin: Skin is warm and dry.  Psychiatric: She has a normal mood and affect. Her behavior is normal. Thought content normal.  Nursing note and vitals reviewed.    ED Treatments / Results  Labs (all labs ordered are listed, but only abnormal results are displayed) Labs Reviewed  COMPREHENSIVE METABOLIC PANEL - Abnormal; Notable for the following:       Result Value   Glucose, Bld 118 (*)    Calcium 8.6 (*)    Albumin 3.3 (*)    All other components within normal limits  CBC - Abnormal; Notable for the following:    WBC 10.8 (*)    All other components within normal limits  URINALYSIS, ROUTINE W REFLEX MICROSCOPIC - Abnormal; Notable for the following:    Hgb urine dipstick MODERATE (*)    Squamous Epithelial / LPF 0-5 (*)    All other components within normal limits  LIPASE, BLOOD    EKG  EKG Interpretation None       Radiology No results found.  Procedures Procedures (including critical care time)  Medications Ordered in ED Medications  ciprofloxacin (CIPRO) tablet 500 mg (not administered)  metroNIDAZOLE (FLAGYL) tablet 500 mg (not administered)  oxyCODONE-acetaminophen (PERCOCET/ROXICET) 5-325 MG per tablet 1 tablet (not administered)  ibuprofen (ADVIL,MOTRIN) tablet 600 mg (not administered)     Initial Impression / Assessment and Plan / ED Course  I have reviewed the triage vital signs and the nursing notes.  Pertinent labs & imaging results that were available during my care of the patient were reviewed by me and considered in my medical decision making (see chart for details).     36yF with abdominal  pain. Will treat presumptively for possible diverticulitis. Mild diffuse tenderness. No peritonitis. Symptoms for almost a week. I doubt acute surgical process. Imaging deferred.   Final Clinical Impressions(s) / ED Diagnoses   Final diagnoses:  Abdominal pain, unspecified abdominal location    New Prescriptions New Prescriptions   No medications on file     Virgel Manifold, MD 09/11/16 1013

## 2016-08-31 NOTE — ED Triage Notes (Signed)
Pt states that she had been having abd pain for the past week. Upper abd pain, and pain during intercourse. Denies n/v/d. States that she thinks she is having a flare up of her diverticulitis. Pt also adds that's she thinks she is allergic to her BP medication.

## 2017-01-12 ENCOUNTER — Encounter (HOSPITAL_COMMUNITY): Payer: Self-pay | Admitting: Emergency Medicine

## 2017-01-12 ENCOUNTER — Emergency Department (HOSPITAL_COMMUNITY)
Admission: EM | Admit: 2017-01-12 | Discharge: 2017-01-12 | Disposition: A | Discharge status: Home / Self Care | Payer: Self-pay | Attending: Emergency Medicine | Admitting: Emergency Medicine

## 2017-01-12 ENCOUNTER — Emergency Department (HOSPITAL_COMMUNITY): Payer: Self-pay

## 2017-01-12 ENCOUNTER — Other Ambulatory Visit: Payer: Self-pay

## 2017-01-12 DIAGNOSIS — Z79899 Other long term (current) drug therapy: Secondary | ICD-10-CM | POA: Insufficient documentation

## 2017-01-12 DIAGNOSIS — M5441 Lumbago with sciatica, right side: Secondary | ICD-10-CM | POA: Insufficient documentation

## 2017-01-12 DIAGNOSIS — F17210 Nicotine dependence, cigarettes, uncomplicated: Secondary | ICD-10-CM | POA: Insufficient documentation

## 2017-01-12 LAB — URINALYSIS, ROUTINE W REFLEX MICROSCOPIC
BACTERIA UA: NONE SEEN
BILIRUBIN URINE: NEGATIVE
Glucose, UA: NEGATIVE mg/dL
Ketones, ur: NEGATIVE mg/dL
Nitrite: NEGATIVE
PROTEIN: NEGATIVE mg/dL
SPECIFIC GRAVITY, URINE: 1.027 (ref 1.005–1.030)
pH: 5 (ref 5.0–8.0)

## 2017-01-12 LAB — BASIC METABOLIC PANEL
Anion gap: 8 (ref 5–15)
BUN: 8 mg/dL (ref 6–20)
CHLORIDE: 110 mmol/L (ref 101–111)
CO2: 21 mmol/L — ABNORMAL LOW (ref 22–32)
CREATININE: 0.66 mg/dL (ref 0.44–1.00)
Calcium: 8.8 mg/dL — ABNORMAL LOW (ref 8.9–10.3)
GFR calc Af Amer: >60 mL/min (ref >60)
GFR calc non Af Amer: >60 mL/min (ref >60)
GLUCOSE: 105 mg/dL — AB (ref 65–99)
POTASSIUM: 4.1 mmol/L (ref 3.5–5.1)
Sodium: 139 mmol/L (ref 135–145)

## 2017-01-12 LAB — POC URINE PREG, ED: Preg Test, Ur: NEGATIVE

## 2017-01-12 LAB — CBC
HEMATOCRIT: 44.1 % (ref 36.0–46.0)
Hemoglobin: 14.2 g/dL (ref 12.0–15.0)
MCH: 29.1 pg (ref 26.0–34.0)
MCHC: 32.2 g/dL (ref 30.0–36.0)
MCV: 90.4 fL (ref 78.0–100.0)
PLATELETS: 342 10*3/uL (ref 150–400)
RBC: 4.88 MIL/uL (ref 3.87–5.11)
RDW: 15.1 % (ref 11.5–15.5)
WBC: 9 10*3/uL (ref 4.0–10.5)

## 2017-01-12 MED ORDER — PREDNISONE 20 MG PO TABS: ORAL_TABLET | ORAL | 0 refills | Status: DC

## 2017-01-12 MED ORDER — KETOROLAC TROMETHAMINE 30 MG/ML IJ SOLN
30.0000 mg | Freq: Once | INTRAMUSCULAR | Status: AC
  Administered 2017-01-12: 30 mg via INTRAMUSCULAR
  Filled 2017-01-12: qty 1

## 2017-01-12 MED ORDER — HYDROCODONE-ACETAMINOPHEN 5-325 MG PO TABS
2.0000 | ORAL_TABLET | Freq: Once | ORAL | Status: AC
  Administered 2017-01-12: 2 via ORAL
  Filled 2017-01-12: qty 2

## 2017-01-12 MED ORDER — OXYCODONE-ACETAMINOPHEN 5-325 MG PO TABS: 1.0000 | ORAL_TABLET | Freq: Four times a day (QID) | ORAL | 0 refills | Status: DC | PRN

## 2017-01-12 NOTE — Discharge Instructions (Signed)
It was our pleasure to provide your ER care today - we hope that you feel better.  Rest. Avoid bending at waist, or heavy lifting > 20 lbs for the next few days.    Try heat therapy to sore area. Take prednisone as prescribed.  You may take percocet as need for pain. No driving for the next 6 hours or when taking percocet. Also, do not take tylenol or acetaminophen containing medication when taking percocet.   Follow up with primary care doctor in 1 week if symptoms fail to improve/resolve.  Return to ER if worse, new symptoms, fevers, intractable pain, weakness, other concern.

## 2017-01-12 NOTE — ED Notes (Signed)
Pt asking for more pain medication. EDP notified.

## 2017-01-12 NOTE — ED Triage Notes (Signed)
Pt reports right flank pain that started yesterday and hurts worse when walking. Pain radiates into right leg. Pt states she may be urinating frequently.

## 2017-01-12 NOTE — ED Notes (Signed)
Patient transported to X-ray 

## 2017-01-12 NOTE — ED Provider Notes (Signed)
[facility] EMERGENCY DEPARTMENT Provider Note   CSN: [id number] Arrival date & time: 01/12/17  9892     History   Chief Complaint Chief Complaint  Patient presents with  . Flank Pain    HPI Tina Riggs is a 37 y.o. female.  Patient c/o right low back pain onset yesterday, worse w certain movements, getting out of bed, and certain other positional changes. No dysuria or hematuria. No fever or chills. Denies specific back injury or strain.  No vaginal bleeding or discharge. Denies hx kidney stone.    The history is provided by the patient.  Flank Pain  Pertinent negatives include no chest pain, no abdominal pain, no headaches and no shortness of breath.    Past Medical History:  Diagnosis Date  . Diverticulitis    hospitalized 04/13/2014; hospitalized 04/29/2014  . HPV in female   . Vaginal Pap smear, abnormal     Patient Active Problem List   Diagnosis Date Noted  . 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 Chlamydia 04/14/2014  . Diverticulitis of colon with perforation 04/13/2014  . Tobacco use 04/13/2014  . Alcohol use 04/13/2014    Past Surgical History:  Procedure Laterality Date  . CESAREAN SECTION  02/2006  . COLONOSCOPY N/A 06/11/2015   Procedure: COLONOSCOPY;  Surgeon: Leighton Ruff, MD;  Location: [facility];  Service: General;  Laterality: N/A;  . COLOSTOMY N/A 05/18/2014   Procedure: COLOSTOMY;  Surgeon: Georganna Skeans, MD;  Location: [facility];  Service: General;  Laterality: N/A;  . COLOSTOMY REVISION N/A 05/18/2014   Procedure: COLON RESECTION SIGMOID;  Surgeon: Georganna Skeans, MD;  Location: [facility];  Service: General;  Laterality: N/A;  . COLPOSCOPY  Sep 2012  . LAPAROSCOPIC LYSIS OF ADHESIONS N/A 05/18/2014   Procedure: LAPAROSCOPIC MOBILIZATION OF SPLENIC FLEXURE;  Surgeon: Georganna Skeans, MD;  Location: [facility];  Service: General;  Laterality: N/A;    OB History    Gravida Para Term Preterm AB Living   1 1 1     1    SAB TAB Ectopic Multiple  Live Births                   Home Medications    Prior to Admission medications   Medication Sig Start Date End Date Taking? Authorizing Provider  baclofen (LIORESAL) 10 MG tablet Take 1 tablet (10 mg total) by mouth 3 (three) times daily. 11/07/15   Pisciotta, Elmyra Ricks, PA-C  ciprofloxacin (CIPRO) 500 MG tablet Take 1 tablet (500 mg total) by mouth every 12 (twelve) hours. 08/31/16   Virgel Manifold, MD  ciprofloxacin (CIPRO) 500 MG/5ML (10%) suspension Take 5 mLs (500 mg total) by mouth 2 (two) times daily. 08/31/16   Virgel Manifold, MD  clindamycin (CLEOCIN) 300 MG capsule Take 300 mg by mouth 4 (four) times daily.    Landis Martins, DPM  diclofenac (VOLTAREN) 75 MG EC tablet Take 1 tablet (75 mg total) by mouth 2 (two) times daily. 02/22/16   Landis Martins, DPM  metroNIDAZOLE (FLAGYL) 500 MG tablet Take 1 tablet (500 mg total) by mouth 2 (two) times daily. 12/14/15   Julianne Handler, CNM  metroNIDAZOLE (FLAGYL) 500 MG tablet Take 1 tablet (500 mg total) by mouth 3 (three) times daily. 08/31/16   Virgel Manifold, MD  ondansetron (ZOFRAN) 4 MG tablet Take 1 tablet (4 mg total) by mouth every 8 (eight) hours as needed for nausea or vomiting. 08/31/16   Virgel Manifold, MD  oxycodone (OXY-IR) 5 MG capsule Take  5 mg by mouth every 4 (four) hours as needed.    Landis Martins, DPM  Prenatal Multivit-Min-Fe-FA (PRENATAL VITAMINS) 0.8 MG tablet Take 1 tablet by mouth daily. Patient not taking: Reported on 12/14/2015 11/29/15   Donnamae Jude, MD  promethazine (PHENERGAN) 25 MG tablet Take 25 mg by mouth every 6 (six) hours as needed for nausea or vomiting.    [provider]  traMADol (ULTRAM) 50 MG tablet Take 1 tablet (50 mg total) by mouth every 6 (six) hours as needed. 08/31/16   Virgel Manifold, MD    Family History No family history on file.  Social History Social History   Tobacco Use  . Smoking status: Current Every Day Smoker    Packs/day: 0.50    Years: 16.00    Pack years:  8.00    Types: Cigarettes  . Smokeless tobacco: Never Used  Substance Use Topics  . Alcohol use: No    Alcohol/week: 0.0 oz  . Drug use: No     Allergies   Robaxin [methocarbamol] and Penicillins   Review of Systems Review of Systems  Constitutional: Negative for fever.  HENT: Negative for sore throat.   Eyes: Negative for redness.  Respiratory: Negative for shortness of breath.   Cardiovascular: Negative for chest pain.  Gastrointestinal: Negative for abdominal pain and vomiting.  Genitourinary: Positive for flank pain. Negative for dysuria, vaginal bleeding and vaginal discharge.  Musculoskeletal: Positive for back pain.  Skin: Negative for rash.  Neurological: Negative for headaches.  Hematological: Does not bruise/bleed easily.  Psychiatric/Behavioral: Negative for confusion.     Physical Exam Updated Vital Signs BP 134/85   Pulse 73   Temp 98.8 F (37.1 C) (Oral)   Resp 19   SpO2 100%   Physical Exam  Constitutional: She appears well-developed and well-nourished. No distress.  HENT:  Mouth/Throat: Oropharynx is clear and moist.  Eyes: Conjunctivae are normal. No scleral icterus.  Neck: Neck supple. No tracheal deviation present.  Cardiovascular: Normal rate, regular rhythm, normal heart sounds and intact distal pulses. Exam reveals no gallop and no friction rub.  No murmur heard. Pulmonary/Chest: Effort normal and breath sounds normal. No respiratory distress.  Abdominal: Soft. Normal appearance and bowel sounds are normal. She exhibits no distension. There is no tenderness.  Genitourinary:  Genitourinary Comments: No cva tenderness  Musculoskeletal: She exhibits no edema.  TLS spine non tender. Lumbar muscular tenderness.   Neurological: She is alert.  Skin: Skin is warm and dry. No rash noted. She is not diaphoretic.  Psychiatric: She has a normal mood and affect.  Nursing note and vitals reviewed.    ED Treatments / Results  Labs (all labs  ordered are listed, but only abnormal results are displayed) Results for orders placed or performed during the hospital encounter of 01/12/17  Urinalysis, Routine w reflex microscopic- may I&O cath if menses  Result Value Ref Range   Color, Urine YELLOW YELLOW   APPearance CLEAR CLEAR   Specific Gravity, Urine 1.027 1.005 - 1.030   pH 5.0 5.0 - 8.0   Glucose, UA NEGATIVE NEGATIVE mg/dL   Hgb urine dipstick SMALL (A) NEGATIVE   Bilirubin Urine NEGATIVE NEGATIVE   Ketones, ur NEGATIVE NEGATIVE mg/dL   Protein, ur NEGATIVE NEGATIVE mg/dL   Nitrite NEGATIVE NEGATIVE   Leukocytes, UA SMALL (A) NEGATIVE   RBC / HPF 6-30 0 - 5 RBC/hpf   WBC, UA 0-5 0 - 5 WBC/hpf   Bacteria, UA NONE SEEN NONE SEEN  Squamous Epithelial / LPF 0-5 (A) NONE SEEN   Mucus PRESENT   CBC  Result Value Ref Range   WBC 9.0 4.0 - 10.5 K/uL   RBC 4.88 3.87 - 5.11 MIL/uL   Hemoglobin 14.2 12.0 - 15.0 g/dL   HCT 44.1 36.0 - 46.0 %   MCV 90.4 78.0 - 100.0 fL   MCH 29.1 26.0 - 34.0 pg   MCHC 32.2 30.0 - 36.0 g/dL   RDW 15.1 11.5 - 15.5 %   Platelets 342 150 - 400 K/uL  Basic metabolic panel  Result Value Ref Range   Sodium 139 135 - 145 mmol/L   Potassium 4.1 3.5 - 5.1 mmol/L   Chloride 110 101 - 111 mmol/L   CO2 21 (L) 22 - 32 mmol/L   Glucose, Bld 105 (H) 65 - 99 mg/dL   BUN 8 6 - 20 mg/dL   Creatinine, Ser 0.66 0.44 - 1.00 mg/dL   Calcium 8.8 (L) 8.9 - 10.3 mg/dL   GFR calc non Af Amer >60 >60 mL/min   GFR calc Af Amer >60 >60 mL/min   Anion gap 8 5 - 15  POC urine preg, ED  Result Value Ref Range   Preg Test, Ur NEGATIVE NEGATIVE    EKG  EKG Interpretation None       Radiology Ct Renal Stone Study  Result Date: 01/12/2017 CLINICAL DATA:  Right lower quadrant and flank pain since yesterday. EXAM: CT ABDOMEN AND PELVIS WITHOUT CONTRAST TECHNIQUE: Multidetector CT imaging of the abdomen and pelvis was performed following the standard protocol without IV contrast. COMPARISON:  August 25, 2015  FINDINGS: Lower chest: No acute abnormality. Hepatobiliary: No focal liver abnormality is seen. No gallstones, gallbladder wall thickening, or biliary dilatation. Pancreas: Unremarkable. No pancreatic ductal dilatation or surrounding inflammatory changes. Spleen: Normal in size without focal abnormality. Adrenals/Urinary Tract: Adrenal glands are unremarkable. Kidneys are normal, without renal calculi, focal lesion, or hydronephrosis. Bladder is unremarkable. Stomach/Bowel: Stomach is within normal limits. Appendix appears normal. No evidence of bowel wall thickening, distention, or inflammatory changes. Distal colonic anastomosis is unchanged. Midline anterior herniation of small bowel loops are identified in the lower abdomen without evidence of bowel obstruction. Vascular/Lymphatic: Aortic atherosclerosis. No enlarged abdominal or pelvic lymph nodes. Reproductive: Uterine fibroid is identified. No abnormal mass is identified in bilateral adnexa. Other: Postsurgical changes are identified in the anterior abdominal wall. There is no ascites. Musculoskeletal: No acute abnormality. IMPRESSION: No nephrolithiasis or hydroureteronephrosis bilaterally. Appendix is normal. No bowel obstruction. Midline anterior herniation of small bowel loops identified in the lower abdomen without evidence of incarceration. Electronically Signed   By: Abelardo Diesel M.D.   On: 01/12/2017 15:09    Procedures Procedures (including critical care time)  Medications Ordered in ED Medications - No data to display   Initial Impression / Assessment and Plan / ED Course  I have reviewed the triage vital signs and the nursing notes.  Pertinent labs & imaging results that were available during my care of the patient were reviewed by me and considered in my medical decision making (see chart for details).  Labs.  CT-scan of the abdomen.  Reviewed nursing notes and prior charts for additional history.   abd is soft non tender. No  incarcerated hernia. No abd pain, no periumbilical pain/tenderness or mass.  Pt states she was informed of abd wall hernia in past.   Patients pain is in her lower back on right. Pt indicates pain radiates to right post/lat  leg. No leg swelling. Fem and distal pulses 2+.  +straight leg raise on right. rle motor 5/5. sens intact. ?radicular pain, ?ddd. Will give rx for home and discussed plan of close outpatient pcp f/u.      Final Clinical Impressions(s) / ED Diagnoses   Final diagnoses:  None    ED Discharge Orders    None       Lajean Saver, MD 01/12/17 1523

## 2017-03-01 ENCOUNTER — Other Ambulatory Visit: Payer: Self-pay | Admitting: Family Medicine

## 2017-03-01 ENCOUNTER — Inpatient Hospital Stay (HOSPITAL_COMMUNITY)
Admission: AD | Admit: 2017-03-01 | Discharge: 2017-03-01 | Disposition: A | Discharge status: Home / Self Care | Payer: Self-pay | Source: Ambulatory Visit | Attending: Obstetrics and Gynecology | Admitting: Obstetrics and Gynecology

## 2017-03-01 DIAGNOSIS — Z880 Allergy status to penicillin: Secondary | ICD-10-CM | POA: Insufficient documentation

## 2017-03-01 DIAGNOSIS — N912 Amenorrhea, unspecified: Secondary | ICD-10-CM | POA: Insufficient documentation

## 2017-03-01 DIAGNOSIS — Z3169 Encounter for other general counseling and advice on procreation: Secondary | ICD-10-CM

## 2017-03-01 DIAGNOSIS — F17210 Nicotine dependence, cigarettes, uncomplicated: Secondary | ICD-10-CM | POA: Insufficient documentation

## 2017-03-01 NOTE — MAU Note (Signed)
Pt referred to clinic for pregnancy verification by provider.

## 2017-03-01 NOTE — MAU Note (Signed)
Pt had missed period. Had a positive HPT test. Wants to know how far along she is . Denies any problems. No pain or bleeding.

## 2017-03-01 NOTE — MAU Provider Note (Signed)
Chief Complaint: Possible Pregnancy  Seen by provider in Triage    SUBJECTIVE HPI: Tina Riggs is a 37 y.o. G1P1001 who presents to maternity admissions for pregnancy confirmation. She denies vaginal bleeding or abdominal pain today.   Past Medical History:  Diagnosis Date  . Diverticulitis    hospitalized 04/13/2014; hospitalized 04/29/2014  . HPV in female   . Vaginal Pap smear, abnormal    Past Surgical History:  Procedure Laterality Date  . CESAREAN SECTION  02/2006  . COLONOSCOPY N/A 06/11/2015   Procedure: COLONOSCOPY;  Surgeon: Leighton Ruff, MD;  Location: [facility];  Service: General;  Laterality: N/A;  . COLOSTOMY N/A 05/18/2014   Procedure: COLOSTOMY;  Surgeon: Georganna Skeans, MD;  Location: [facility];  Service: General;  Laterality: N/A;  . COLOSTOMY REVISION N/A 05/18/2014   Procedure: COLON RESECTION SIGMOID;  Surgeon: Georganna Skeans, MD;  Location: [facility];  Service: General;  Laterality: N/A;  . COLPOSCOPY  Sep 2012  . LAPAROSCOPIC LYSIS OF ADHESIONS N/A 05/18/2014   Procedure: LAPAROSCOPIC MOBILIZATION OF SPLENIC FLEXURE;  Surgeon: Georganna Skeans, MD;  Location: [facility] [facility];  Service: General;  Laterality: N/A;   Social History   Socioeconomic History  . Marital status: Single    Spouse name: Not on file  . Number of children: Not on file  . Years of education: Not on file  . Highest education level: Not on file  Social Needs  . Financial resource strain: Not on file  . Food insecurity - worry: Not on file  . Food insecurity - inability: Not on file  . Transportation needs - medical: Not on file  . Transportation needs - non-medical: Not on file  Occupational History  . Not on file  Tobacco Use  . Smoking status: Current Every Day Smoker    Packs/day: 0.50    Years: 16.00    Pack years: 8.00    Types: Cigarettes  . Smokeless tobacco: Never Used  Substance and Sexual Activity  . Alcohol use: No    Alcohol/week: 0.0 oz  . Drug use: No  . Sexual activity: Yes     Birth control/protection: None  Other Topics Concern  . Not on file  Social History Narrative  . Not on file   No current facility-administered medications on file prior to encounter.    Current Outpatient Medications on File Prior to Encounter  Medication Sig Dispense Refill  . baclofen (LIORESAL) 10 MG tablet Take 1 tablet (10 mg total) by mouth 3 (three) times daily. 30 each 0  . ciprofloxacin (CIPRO) 500 MG tablet Take 1 tablet (500 mg total) by mouth every 12 (twelve) hours. 14 tablet 0  . ciprofloxacin (CIPRO) 500 MG/5ML (10%) suspension Take 5 mLs (500 mg total) by mouth 2 (two) times daily. 100 mL 0  . clindamycin (CLEOCIN) 300 MG capsule Take 300 mg by mouth 4 (four) times daily.    . diclofenac (VOLTAREN) 75 MG EC tablet Take 1 tablet (75 mg total) by mouth 2 (two) times daily. 30 tablet 0  . metroNIDAZOLE (FLAGYL) 500 MG tablet Take 1 tablet (500 mg total) by mouth 2 (two) times daily. 14 tablet 0  . metroNIDAZOLE (FLAGYL) 500 MG tablet Take 1 tablet (500 mg total) by mouth 3 (three) times daily. 21 tablet 0  . ondansetron (ZOFRAN) 4 MG tablet Take 1 tablet (4 mg total) by mouth every 8 (eight) hours as needed for nausea or vomiting. 10 tablet 0  . oxycodone (OXY-IR) 5 MG capsule  Take 5 mg by mouth every 4 (four) hours as needed.    Marland Kitchen oxyCODONE-acetaminophen (PERCOCET/ROXICET) 5-325 MG tablet Take 1-2 tablets by mouth every 6 (six) hours as needed for severe pain. 15 tablet 0  . predniSONE (DELTASONE) 20 MG tablet 3 po once a day for 2 days, then 2 po once a day for 3 days, then 1 po once a day for 3 days 15 tablet 0  . Prenatal Vit-Fe Fumarate-FA (PNV PRENATAL PLUS MULTIVITAMIN) 27-1 MG TABS TAKE 1 TABLET BY MOUTH DAILY. 30 tablet 12  . promethazine (PHENERGAN) 25 MG tablet Take 25 mg by mouth every 6 (six) hours as needed for nausea or vomiting.    . traMADol (ULTRAM) 50 MG tablet Take 1 tablet (50 mg total) by mouth every 6 (six) hours as needed. 10 tablet 0   Allergies   Allergen Reactions  . Robaxin [Methocarbamol] Swelling and Other (See Comments)    Bottom lip swelled  . Penicillins Itching, Rash and Other (See Comments)    Has patient had a PCN reaction causing immediate rash, facial/tongue/throat swelling, SOB or lightheadedness with hypotension: yes Has patient had a PCN reaction causing severe rash involving mucus membranes or skin necrosis: no Has patient had a PCN reaction that required hospitalization no Has patient had a PCN reaction occurring within the last 10 years: no If all of the above answers are "NO", then may proceed with Cephalosporin use.     ROS:  Review of Systems  I have reviewed patient's Past Medical Hx, Surgical Hx, Family Hx, Social Hx, medications and allergies.   Physical Exam   Patient Vitals for the past 24 hrs:  BP Temp Pulse Resp Height Weight  03/01/17 1728 (!) 154/83 99 F (37.2 C) 82 18 5\' 6"  (1.676 m) (!) 309 lb (140.2 kg)   Physical Exam GENERAL: Well-developed, well-nourished female in no acute distress.  HEENT: Normocephalic, atraumatic.  LUNGS: Effort normal  HEART: Regular rate  SKIN: Warm, dry and without erythema  PSYCH: Normal mood and affect   MDM Patient denies any concerning symptoms for ectopic. She has been advised that UPT will not be performed today strictly for pregnancy confirmation and that she may present to the [facility] during business hours for a pregnancy test.   ASSESSMENT 1. Amenorrhea     PLAN Discharge home in stable condition First trimester/ectopic precautions discussed Patient advised to follow-up with [facility] for pregnancy test - works until 7 pm daily and is only off on Tuesdays.  Advised to come at 7:15 pm on Monday for pregnancy confirmation. Patient may return to MAU as needed or if her condition were to change or worsen Advised to stop all smoking. Saw BP after client left - likely has chronic untreated hypertension - reports she is not taking any medications and  wanted prenatal vitamins.   Virginia Rochester, NP 03/01/2017 5:45 PM

## 2017-03-05 ENCOUNTER — Encounter: Payer: Self-pay | Admitting: General Practice

## 2017-03-05 ENCOUNTER — Ambulatory Visit (INDEPENDENT_AMBULATORY_CARE_PROVIDER_SITE_OTHER): Payer: Self-pay | Admitting: *Deleted

## 2017-03-05 DIAGNOSIS — Z3200 Encounter for pregnancy test, result unknown: Secondary | ICD-10-CM

## 2017-03-05 DIAGNOSIS — Z3201 Encounter for pregnancy test, result positive: Secondary | ICD-10-CM

## 2017-03-05 LAB — POCT PREGNANCY, URINE: PREG TEST UR: POSITIVE — AB

## 2017-03-05 NOTE — Progress Notes (Signed)
Pt informed of +UPT today.  LMP 01/17/17.  EDD 10/24/17. Medication reconciliation completed. Pt will schedule prenatal care @ [facility].

## 2017-03-07 NOTE — Progress Notes (Signed)
I have reviewed the chart and agree with nursing staff's documentation of this patient's encounter.  Kerry Hough, PA-C 03/07/2017 1:17 PM

## 2017-04-04 ENCOUNTER — Ambulatory Visit (INDEPENDENT_AMBULATORY_CARE_PROVIDER_SITE_OTHER): Payer: Medicaid Other | Admitting: Obstetrics and Gynecology

## 2017-04-04 ENCOUNTER — Encounter: Payer: Self-pay | Admitting: Obstetrics and Gynecology

## 2017-04-04 ENCOUNTER — Other Ambulatory Visit (HOSPITAL_COMMUNITY)
Admission: RE | Admit: 2017-04-04 | Discharge: 2017-04-04 | Disposition: A | Discharge status: Home / Self Care | Payer: Medicaid Other | Source: Ambulatory Visit | Attending: Obstetrics and Gynecology | Admitting: Obstetrics and Gynecology

## 2017-04-04 VITALS — Wt 307.9 lb

## 2017-04-04 DIAGNOSIS — Z3491 Encounter for supervision of normal pregnancy, unspecified, first trimester: Secondary | ICD-10-CM | POA: Diagnosis not present

## 2017-04-04 DIAGNOSIS — E669 Obesity, unspecified: Secondary | ICD-10-CM

## 2017-04-04 DIAGNOSIS — Z3490 Encounter for supervision of normal pregnancy, unspecified, unspecified trimester: Secondary | ICD-10-CM | POA: Diagnosis present

## 2017-04-04 DIAGNOSIS — Z720 Tobacco use: Secondary | ICD-10-CM

## 2017-04-04 DIAGNOSIS — O99210 Obesity complicating pregnancy, unspecified trimester: Secondary | ICD-10-CM | POA: Insufficient documentation

## 2017-04-04 DIAGNOSIS — Z3481 Encounter for supervision of other normal pregnancy, first trimester: Secondary | ICD-10-CM | POA: Diagnosis not present

## 2017-04-04 DIAGNOSIS — O99211 Obesity complicating pregnancy, first trimester: Secondary | ICD-10-CM

## 2017-04-04 DIAGNOSIS — O09899 Supervision of other high risk pregnancies, unspecified trimester: Secondary | ICD-10-CM | POA: Insufficient documentation

## 2017-04-04 LAB — POCT URINALYSIS DIP (DEVICE)
BILIRUBIN URINE: NEGATIVE
Glucose, UA: NEGATIVE mg/dL
KETONES UR: NEGATIVE mg/dL
Leukocytes, UA: NEGATIVE
NITRITE: NEGATIVE
PH: 7 (ref 5.0–8.0)
Protein, ur: NEGATIVE mg/dL
Specific Gravity, Urine: 1.025 (ref 1.005–1.030)
Urobilinogen, UA: 1 mg/dL (ref 0.0–1.0)

## 2017-04-04 NOTE — Patient Instructions (Signed)
First Trimester of Pregnancy The first trimester of pregnancy is from week 1 until the end of week 13 (months 1 through 3). During this time, your baby will begin to develop inside you. At 6-8 weeks, the eyes and face are formed, and the heartbeat can be seen on ultrasound. At the end of 12 weeks, all the baby's organs are formed. Prenatal care is all the medical care you receive before the birth of your baby. Make sure you get good prenatal care and follow all of your doctor's instructions. Follow these instructions at home: Medicines  Take over-the-counter and prescription medicines only as told by your doctor. Some medicines are safe and some medicines are not safe during pregnancy.  Take a prenatal vitamin that contains at least 600 micrograms (mcg) of folic acid.  If you have trouble pooping (constipation), take medicine that will make your stool soft (stool softener) if your doctor approves. Eating and drinking  Eat regular, healthy meals.  Your doctor will tell you the amount of weight gain that is right for you.  Avoid raw meat and uncooked cheese.  If you feel sick to your stomach (nauseous) or throw up (vomit): ? Eat 4 or 5 small meals a day instead of 3 large meals. ? Try eating a few soda crackers. ? Drink liquids between meals instead of during meals.  To prevent constipation: ? Eat foods that are high in fiber, like fresh fruits and vegetables, whole grains, and beans. ? Drink enough fluids to keep your pee (urine) clear or pale yellow. Activity  Exercise only as told by your doctor. Stop exercising if you have cramps or pain in your lower belly (abdomen) or low back.  Do not exercise if it is too hot, too humid, or if you are in a place of great height (high altitude).  Try to avoid standing for long periods of time. Move your legs often if you must stand in one place for a long time.  Avoid heavy lifting.  Wear low-heeled shoes. Sit and stand up straight.  You  can have sex unless your doctor tells you not to. Relieving pain and discomfort  Wear a good support bra if your breasts are sore.  Take warm water baths (sitz baths) to soothe pain or discomfort caused by hemorrhoids. Use hemorrhoid cream if your doctor says it is okay.  Rest with your legs raised if you have leg cramps or low back pain.  If you have puffy, bulging veins (varicose veins) in your legs: ? Wear support hose or compression stockings as told by your doctor. ? Raise (elevate) your feet for 15 minutes, 3-4 times a day. ? Limit salt in your food. Prenatal care  Schedule your prenatal visits by the twelfth week of pregnancy.  Write down your questions. Take them to your prenatal visits.  Keep all your prenatal visits as told by your doctor. This is important. Safety  Wear your seat belt at all times when driving.  Make a list of emergency phone numbers. The list should include numbers for family, friends, the hospital, and police and fire departments. General instructions  Ask your doctor for a referral to a local prenatal class. Begin classes no later than at the start of month 6 of your pregnancy.  Ask for help if you need counseling or if you need help with nutrition. Your doctor can give you advice or tell you where to go for help.  Do not use hot tubs, steam rooms, or   saunas.  Do not douche or use tampons or scented sanitary pads.  Do not cross your legs for long periods of time.  Avoid all herbs and alcohol. Avoid drugs that are not approved by your doctor.  Do not use any tobacco products, including cigarettes, chewing tobacco, and electronic cigarettes. If you need help quitting, ask your doctor. You may get counseling or other support to help you quit.  Avoid cat litter boxes and soil used by cats. These carry germs that can cause birth defects in the baby and can cause a loss of your baby (miscarriage) or stillbirth.  Visit your dentist. At home, brush  your teeth with a soft toothbrush. Be gentle when you floss. Contact a doctor if:  You are dizzy.  You have mild cramps or pressure in your lower belly.  You have a nagging pain in your belly area.  You continue to feel sick to your stomach, you throw up, or you have watery poop (diarrhea).  You have a bad smelling fluid coming from your vagina.  You have pain when you pee (urinate).  You have increased puffiness (swelling) in your face, hands, legs, or ankles. Get help right away if:  You have a fever.  You are leaking fluid from your vagina.  You have spotting or bleeding from your vagina.  You have very bad belly cramping or pain.  You gain or lose weight rapidly.  You throw up blood. It may look like coffee grounds.  You are around people who have German measles, fifth disease, or chickenpox.  You have a very bad headache.  You have shortness of breath.  You have any kind of trauma, such as from a fall or a car accident. Summary  The first trimester of pregnancy is from week 1 until the end of week 13 (months 1 through 3).  To take care of yourself and your unborn baby, you will need to eat healthy meals, take medicines only if your doctor tells you to do so, and do activities that are safe for you and your baby.  Keep all follow-up visits as told by your doctor. This is important as your doctor will have to ensure that your baby is healthy and growing well. This information is not intended to replace advice given to you by your health care provider. Make sure you discuss any questions you have with your health care provider. Document Released: 06/07/2007 Document Revised: 12/28/2015 Document Reviewed: 12/28/2015 Elsevier Interactive Patient Education  2017 [employer]  

## 2017-04-04 NOTE — Progress Notes (Signed)
New OB Note  04/04/2017   CC:  Chief Complaint  Patient presents with  . Initial Prenatal Visit    Transfer of Care Patient: no  History of Present Illness: Tina Riggs is a 37 y.o. G2P1001 at [redacted]w[redacted]d by LMP being seen today for her first obstetrical visit. Her obstetrical history is significant for obesity and smoker. This was a unplanned pregnancy. Patient does not intend to breast feed. Patient unsure for contraception after completion of pregnancy. Pregnancy history fully reviewed.  Her periods were: regular She was using no method when she conceived.  She has Negative signs or symptoms of nausea/vomiting of pregnancy. She has Negative signs or symptoms of miscarriage or preterm labor  Patient reports no complaints.  ROS: A 12-point review of systems was performed and negative, except as stated in the above HPI.   OBGYN History: OB History  Gravida Para Term Preterm AB Living  2 1 1     1   SAB TAB Ectopic Multiple Live Births          1    # Outcome Date GA Lbr Len/2nd Weight Sex Delivery Anes PTL Lv  2 Current           1 Term 2008 [redacted]w[redacted]d  3.204 kg (7 lb 1 oz) M  EPI  LIV     Birth Comments: c/s due to something about potassium    GYN Hx: Prior abnormal paps previously  Past Medical History: Past Medical History:  Diagnosis Date  . Diverticulitis    hospitalized 04/13/2014; hospitalized 04/29/2014  . HPV in female   . Vaginal Pap smear, abnormal     Past Surgical History: Past Surgical History:  Procedure Laterality Date  . CESAREAN SECTION  02/2006  . COLONOSCOPY N/A 06/11/2015   Procedure: COLONOSCOPY;  Surgeon: Leighton Ruff, MD;  Location: [facility];  Service: General;  Laterality: N/A;  . COLOSTOMY N/A 05/18/2014   Procedure: COLOSTOMY;  Surgeon: Georganna Skeans, MD;  Location: [facility];  Service: General;  Laterality: N/A;  . COLOSTOMY REVISION N/A 05/18/2014   Procedure: COLON RESECTION SIGMOID;  Surgeon: Georganna Skeans, MD;  Location: [facility];  Service:  General;  Laterality: N/A;  . COLPOSCOPY  Sep 2012  . LAPAROSCOPIC LYSIS OF ADHESIONS N/A 05/18/2014   Procedure: LAPAROSCOPIC MOBILIZATION OF SPLENIC FLEXURE;  Surgeon: Georganna Skeans, MD;  [facility];  Service: General;  Laterality: N/A;    Family History:  Family History  Problem Relation Age of Onset  . HIV Mother     Social History:  Social History   Socioeconomic History  . Marital status: Single    Spouse name: Not on file  . Number of children: Not on file  . Years of education: Not on file  . Highest education level: Not on file  Occupational History  . Not on file  Social Needs  . Financial resource strain: Not on file  . Food insecurity:    Worry: Not on file    Inability: Not on file  . Transportation needs:    Medical: Not on file    Non-medical: Not on file  Tobacco Use  . Smoking status: Current Every Day Smoker    Packs/day: 0.50    Years: 16.00    Pack years: 8.00    Types: Cigarettes  . Smokeless tobacco: Never Used  Substance and Sexual Activity  . Alcohol use: No    Alcohol/week: 0.0 oz  . Drug use: No  .  Sexual activity: Yes    Birth control/protection: None  Lifestyle  . Physical activity:    Days per week: Not on file    Minutes per session: Not on file  . Stress: Not on file  Relationships  . Social connections:    Talks on phone: Not on file    Gets together: Not on file    Attends religious service: Not on file    Active member of club or organization: Not on file    Attends meetings of clubs or organizations: Not on file    Relationship status: Not on file  . Intimate partner violence:    Fear of current or ex partner: Not on file    Emotionally abused: Not on file    Physically abused: Not on file    Forced sexual activity: Not on file  Other Topics Concern  . Not on file  Social History Narrative  . Not on file    Allergy: Allergies  Allergen Reactions  . Robaxin [Methocarbamol] Swelling and Other (See Comments)      Bottom lip swelled  . Penicillins Itching, Rash and Other (See Comments)    Has patient had a PCN reaction causing immediate rash, facial/tongue/throat swelling, SOB or lightheadedness with hypotension: yes Has patient had a PCN reaction causing severe rash involving mucus membranes or skin necrosis: no Has patient had a PCN reaction that required hospitalization no Has patient had a PCN reaction occurring within the last 10 years: no If all of the above answers are "NO", then may proceed with Cephalosporin use.     Current Outpatient Medications:  Current Outpatient Medications:  .  Prenatal Vit-Fe Fumarate-FA (PNV PRENATAL PLUS MULTIVITAMIN) 27-1 MG TABS, TAKE 1 TABLET BY MOUTH DAILY., Disp: 30 tablet, Rfl: 12  Physical Exam:   Wt (!) 139.7 kg (307 lb 14.4 oz)   LMP 01/17/2017   BMI 49.70 kg/m  Body mass index is 49.7 kg/m. Contractions: Not present Vag. Bleeding: None. FHTs: 165 bpm  General appearance: Well nourished, well developed female in no acute distress.  Neck:  Supple, normal appearance Cardiovascular: regular rate and rhythm Respiratory: Normal respiratory effort Abdomen: Soft, no masses, hernias; diffusely non tender to palpation, non distended, old well healed scars from prior abdominal surgeries Breasts: breasts appear normal, no suspicious masses, no skin or nipple changes or axillary nodes. Genitalia:  Normal introitus for age, no external lesions, no vaginal discharge, mucosa pink and moist, no vaginal or cervical lesions, no vaginal atrophy, no friaility or hemorrhage, normal uterus size and position, no adnexal masses or tenderness Neuro/Psych:  Normal mood and affect.  Skin:  Warm and dry.    Assessment/Plan: G2P1001 [redacted]w[redacted]d  1. Encounter for supervision of low-risk pregnancy, antepartum Doing well. - Hemoglobin A1c: collected due to multiple risk factors.  -Pap collected  2. Tobacco use Encouraged tobacco cessation.    Initial labs  drawn. Continue prenatal vitamins. Genetic Screening discussed, requested ; collect at next visit.  Ultrasound discussed; fetal anatomic survey: ordered. Problem list reviewed and updated. The nature of [facility] with multiple MDs and other Advanced Practice Providers was explained to patient; also emphasized that residents, students are part of our team. Routine obstetric precautions reviewed. Return in about 1 month (around 05/02/2017) for ob visit.    Luiz Blare, DO OB Fellow Center for [facility], [facility]

## 2017-04-04 NOTE — Progress Notes (Signed)
Here for new ob. Given new patient info. Already has MyChart. Signed up for babyscripts app. Given pmh to complete. PMH completed.

## 2017-04-06 ENCOUNTER — Encounter: Payer: Self-pay | Admitting: Obstetrics and Gynecology

## 2017-04-06 LAB — CULTURE, OB URINE

## 2017-04-06 LAB — URINE CULTURE, OB REFLEX

## 2017-04-09 ENCOUNTER — Encounter: Payer: Self-pay | Admitting: *Deleted

## 2017-04-10 LAB — CYTOLOGY - PAP
Adequacy: ABSENT
Chlamydia: NEGATIVE
Diagnosis: NEGATIVE
HPV (WINDOPATH): NOT DETECTED
NEISSERIA GONORRHEA: NEGATIVE

## 2017-04-11 ENCOUNTER — Encounter: Payer: Self-pay | Admitting: Family Medicine

## 2017-04-11 DIAGNOSIS — O09529 Supervision of elderly multigravida, unspecified trimester: Secondary | ICD-10-CM | POA: Insufficient documentation

## 2017-04-11 DIAGNOSIS — Z6841 Body Mass Index (BMI) 40.0 and over, adult: Secondary | ICD-10-CM | POA: Insufficient documentation

## 2017-04-11 LAB — OBSTETRIC PANEL, INCLUDING HIV
Antibody Screen: NEGATIVE
BASOS ABS: 0 10*3/uL (ref 0.0–0.2)
Basos: 0 %
EOS (ABSOLUTE): 0.2 10*3/uL (ref 0.0–0.4)
Eos: 1 %
HEP B S AG: NEGATIVE
HIV Screen 4th Generation wRfx: NONREACTIVE
Hematocrit: 35.7 % (ref 34.0–46.6)
Hemoglobin: 11.4 g/dL (ref 11.1–15.9)
IMMATURE GRANULOCYTES: 0 %
Immature Grans (Abs): 0 10*3/uL (ref 0.0–0.1)
LYMPHS ABS: 3.3 10*3/uL — AB (ref 0.7–3.1)
Lymphs: 30 %
MCH: 29.5 pg (ref 26.6–33.0)
MCHC: 31.9 g/dL (ref 31.5–35.7)
MCV: 92 fL (ref 79–97)
Monocytes Absolute: 0.5 10*3/uL (ref 0.1–0.9)
Monocytes: 5 %
NEUTROS PCT: 64 %
Neutrophils Absolute: 7.1 10*3/uL — ABNORMAL HIGH (ref 1.4–7.0)
PLATELETS: 359 10*3/uL (ref 150–379)
RBC: 3.87 x10E6/uL (ref 3.77–5.28)
RDW: 14.9 % (ref 12.3–15.4)
RPR: NONREACTIVE
Rh Factor: POSITIVE
Rubella Antibodies, IGG: 1.53 index (ref >0.99)
WBC: 11.2 10*3/uL — AB (ref 3.4–10.8)

## 2017-04-11 LAB — HEMOGLOBINOPATHY EVALUATION
FERRITIN: 60 ng/mL (ref 15–150)
HGB C: 0 %
HGB SOLUBILITY: NEGATIVE
Hgb A2 Quant: 2.1 % (ref 1.8–3.2)
Hgb A: 97.9 % (ref 96.4–98.8)
Hgb F Quant: 0 % (ref 0.0–2.0)
Hgb S: 0 %
Hgb Variant: 0 %

## 2017-04-11 LAB — SMN1 COPY NUMBER ANALYSIS (SMA CARRIER SCREENING)

## 2017-04-11 LAB — HEMOGLOBIN A1C
Est. average glucose Bld gHb Est-mCnc: 117 mg/dL
Hgb A1c MFr Bld: 5.7 % — ABNORMAL HIGH (ref 4.8–5.6)

## 2017-04-17 ENCOUNTER — Encounter (HOSPITAL_COMMUNITY): Payer: Self-pay | Admitting: *Deleted

## 2017-04-17 ENCOUNTER — Inpatient Hospital Stay (HOSPITAL_COMMUNITY)
Admission: AD | Admit: 2017-04-17 | Discharge: 2017-04-18 | Disposition: A | Discharge status: Home / Self Care | Payer: Medicaid Other | Source: Ambulatory Visit | Attending: Obstetrics and Gynecology | Admitting: Obstetrics and Gynecology

## 2017-04-17 DIAGNOSIS — O9989 Other specified diseases and conditions complicating pregnancy, childbirth and the puerperium: Secondary | ICD-10-CM | POA: Diagnosis not present

## 2017-04-17 DIAGNOSIS — R109 Unspecified abdominal pain: Secondary | ICD-10-CM | POA: Diagnosis not present

## 2017-04-17 DIAGNOSIS — Z3A13 13 weeks gestation of pregnancy: Secondary | ICD-10-CM | POA: Insufficient documentation

## 2017-04-17 DIAGNOSIS — O26891 Other specified pregnancy related conditions, first trimester: Secondary | ICD-10-CM | POA: Insufficient documentation

## 2017-04-17 DIAGNOSIS — H319 Unspecified disorder of choroid: Secondary | ICD-10-CM | POA: Diagnosis present

## 2017-04-17 DIAGNOSIS — O99331 Smoking (tobacco) complicating pregnancy, first trimester: Secondary | ICD-10-CM | POA: Diagnosis not present

## 2017-04-17 DIAGNOSIS — R1032 Left lower quadrant pain: Secondary | ICD-10-CM | POA: Insufficient documentation

## 2017-04-17 DIAGNOSIS — F17210 Nicotine dependence, cigarettes, uncomplicated: Secondary | ICD-10-CM | POA: Diagnosis not present

## 2017-04-17 DIAGNOSIS — R319 Hematuria, unspecified: Secondary | ICD-10-CM

## 2017-04-17 NOTE — MAU Note (Signed)
PT  SAYS SHE HAS LOWER LEFT ABD PAIN.     FEELS NUMBNESS IN BOTH   THIGHS.     WENT  TO CLINIC  FOR  FIRST APPOINTMENT - WANTS  SOMEONE  TO GO OVER RESULTS.

## 2017-04-18 ENCOUNTER — Inpatient Hospital Stay (HOSPITAL_COMMUNITY): Payer: Medicaid Other

## 2017-04-18 ENCOUNTER — Telehealth: Payer: Self-pay

## 2017-04-18 ENCOUNTER — Inpatient Hospital Stay (EMERGENCY_DEPARTMENT_HOSPITAL)
Admission: AD | Admit: 2017-04-18 | Discharge: 2017-04-18 | Disposition: A | Discharge status: Home / Self Care | Payer: Medicaid Other | Source: Ambulatory Visit | Attending: Family Medicine | Admitting: Family Medicine

## 2017-04-18 ENCOUNTER — Other Ambulatory Visit: Payer: Self-pay

## 2017-04-18 ENCOUNTER — Telehealth: Payer: Self-pay | Admitting: Obstetrics and Gynecology

## 2017-04-18 ENCOUNTER — Telehealth: Payer: Self-pay | Admitting: Gastroenterology

## 2017-04-18 ENCOUNTER — Encounter: Payer: Self-pay | Admitting: General Practice

## 2017-04-18 DIAGNOSIS — Z3A13 13 weeks gestation of pregnancy: Secondary | ICD-10-CM

## 2017-04-18 DIAGNOSIS — K5792 Diverticulitis of intestine, part unspecified, without perforation or abscess without bleeding: Secondary | ICD-10-CM

## 2017-04-18 DIAGNOSIS — O26891 Other specified pregnancy related conditions, first trimester: Secondary | ICD-10-CM

## 2017-04-18 DIAGNOSIS — R1032 Left lower quadrant pain: Secondary | ICD-10-CM | POA: Diagnosis not present

## 2017-04-18 DIAGNOSIS — R109 Unspecified abdominal pain: Secondary | ICD-10-CM | POA: Diagnosis not present

## 2017-04-18 DIAGNOSIS — O9989 Other specified diseases and conditions complicating pregnancy, childbirth and the puerperium: Secondary | ICD-10-CM | POA: Diagnosis not present

## 2017-04-18 LAB — COMPREHENSIVE METABOLIC PANEL
ALT: 15 U/L (ref 14–54)
AST: 19 U/L (ref 15–41)
Albumin: 3 g/dL — ABNORMAL LOW (ref 3.5–5.0)
Alkaline Phosphatase: 80 U/L (ref 38–126)
Anion gap: 9 (ref 5–15)
BUN: 9 mg/dL (ref 6–20)
CHLORIDE: 105 mmol/L (ref 101–111)
CO2: 20 mmol/L — AB (ref 22–32)
Calcium: 9.1 mg/dL (ref 8.9–10.3)
Creatinine, Ser: 0.56 mg/dL (ref 0.44–1.00)
GFR calc non Af Amer: >60 mL/min (ref >60)
Glucose, Bld: 98 mg/dL (ref 65–99)
Potassium: 3.9 mmol/L (ref 3.5–5.1)
SODIUM: 134 mmol/L — AB (ref 135–145)
Total Bilirubin: 0.3 mg/dL (ref 0.3–1.2)
Total Protein: 6.4 g/dL — ABNORMAL LOW (ref 6.5–8.1)

## 2017-04-18 LAB — URINALYSIS, ROUTINE W REFLEX MICROSCOPIC
BILIRUBIN URINE: NEGATIVE
Glucose, UA: NEGATIVE mg/dL
Ketones, ur: 5 mg/dL — AB
LEUKOCYTES UA: NEGATIVE
NITRITE: NEGATIVE
PROTEIN: NEGATIVE mg/dL
Specific Gravity, Urine: 1.032 — ABNORMAL HIGH (ref 1.005–1.030)
pH: 6 (ref 5.0–8.0)

## 2017-04-18 LAB — CBC
HCT: 35.7 % — ABNORMAL LOW (ref 36.0–46.0)
Hemoglobin: 11.9 g/dL — ABNORMAL LOW (ref 12.0–15.0)
MCH: 30 pg (ref 26.0–34.0)
MCHC: 33.3 g/dL (ref 30.0–36.0)
MCV: 89.9 fL (ref 78.0–100.0)
PLATELETS: 315 10*3/uL (ref 150–400)
RBC: 3.97 MIL/uL (ref 3.87–5.11)
RDW: 13.9 % (ref 11.5–15.5)
WBC: 11.3 10*3/uL — AB (ref 4.0–10.5)

## 2017-04-18 MED ORDER — TRAMADOL HCL 50 MG PO TABS
50.0000 mg | ORAL_TABLET | Freq: Once | ORAL | Status: AC
  Administered 2017-04-18: 50 mg via ORAL
  Filled 2017-04-18: qty 1

## 2017-04-18 MED ORDER — AMOXICILLIN-POT CLAVULANATE 875-125 MG PO TABS
1.0000 | ORAL_TABLET | Freq: Once | ORAL | Status: DC
  Filled 2017-04-18: qty 1

## 2017-04-18 MED ORDER — AMOXICILLIN-POT CLAVULANATE 875-125 MG PO TABS: 1.0000 | ORAL_TABLET | Freq: Two times a day (BID) | ORAL | 0 refills | Status: DC

## 2017-04-18 MED ORDER — FLUCONAZOLE 150 MG PO TABS: 150.0000 mg | ORAL_TABLET | Freq: Every day | ORAL | 1 refills | Status: DC

## 2017-04-18 MED ORDER — PHENAZOPYRIDINE HCL 100 MG PO TABS
100.0000 mg | ORAL_TABLET | Freq: Once | ORAL | Status: AC
  Administered 2017-04-18: 100 mg via ORAL
  Filled 2017-04-18: qty 1

## 2017-04-18 MED ORDER — HYDROMORPHONE HCL 1 MG/ML IJ SOLN
1.0000 mg | Freq: Once | INTRAMUSCULAR | Status: DC
  Filled 2017-04-18: qty 1

## 2017-04-18 MED ORDER — PHENAZOPYRIDINE HCL 100 MG PO TABS: 100.0000 mg | ORAL_TABLET | Freq: Three times a day (TID) | ORAL | 0 refills | Status: DC | PRN

## 2017-04-18 MED ORDER — OXYCODONE-ACETAMINOPHEN 5-325 MG PO TABS: 2.0000 | ORAL_TABLET | Freq: Four times a day (QID) | ORAL | 0 refills | Status: DC | PRN

## 2017-04-18 MED ORDER — NITROFURANTOIN MONOHYD MACRO 100 MG PO CAPS: 100.0000 mg | ORAL_CAPSULE | Freq: Two times a day (BID) | ORAL | 1 refills | Status: DC

## 2017-04-18 MED ORDER — OXYCODONE-ACETAMINOPHEN 5-325 MG PO TABS
2.0000 | ORAL_TABLET | Freq: Once | ORAL | Status: AC
  Administered 2017-04-18: 2 via ORAL
  Filled 2017-04-18: qty 2

## 2017-04-18 MED ORDER — AMOXICILLIN-POT CLAVULANATE 875-125 MG PO TABS
1.0000 | ORAL_TABLET | Freq: Once | ORAL | Status: AC
  Administered 2017-04-18: 1 via ORAL
  Filled 2017-04-18: qty 1

## 2017-04-18 MED ORDER — LACTATED RINGERS IV SOLN: INTRAVENOUS | Status: DC

## 2017-04-18 MED ORDER — TRAMADOL HCL 50 MG PO TABS: 25.0000 mg | ORAL_TABLET | Freq: Once | ORAL | Status: DC

## 2017-04-18 MED ORDER — OXYCODONE-ACETAMINOPHEN 7.5-325 MG PO TABS: 1.0000 | ORAL_TABLET | ORAL | 0 refills | Status: DC | PRN

## 2017-04-18 NOTE — MAU Provider Note (Signed)
Chief Complaint:  Abdominal Pain   First Provider Initiated Contact with Patient 04/18/17 0515     HPI: Tina Riggs is a 37 y.o. G2P1001 at 25w0dwho presents to maternity admissions reporting LLQ pain. States is severe and is mostly in LLQ just below colostomy scar.   Has known history of diverticultis with hx of colostomy (later reversed) and multiple surgeries.. Worried it is a kidney stone or infection. She denies LOF, vaginal bleeding, vaginal itching/burning, urinary symptoms, h/a, dizziness, n/v, diarrhea, constipation or fever/chills.    Abdominal Pain  This is a new problem. The current episode started yesterday. The onset quality is gradual. The problem occurs constantly. The problem has been unchanged. The pain is located in the LLQ. The pain is moderate. The quality of the pain is aching, cramping and sharp. The abdominal pain does not radiate. Pertinent negatives include no anorexia, constipation, diarrhea, dysuria, fever, frequency, myalgias, nausea or vomiting. The pain is aggravated by palpation. The pain is relieved by nothing. She has tried nothing for the symptoms. Her past medical history is significant for abdominal surgery. colostomy for ruptured diverticulum    RN Note: PT  SAYS SHE HAS LOWER LEFT ABD PAIN.     FEELS NUMBNESS IN BOTH   THIGHS.     WENT  TO CLINIC  FOR  FIRST APPOINTMENT - WANTS  SOMEONE  TO GO OVER RESULTS.    Past Medical History: Past Medical History:  Diagnosis Date  . Diverticulitis    hospitalized 04/13/2014; hospitalized 04/29/2014  . HPV in female   . Vaginal Pap smear, abnormal     Past obstetric history: OB History  Gravida Para Term Preterm AB Living  2 1 1     1   SAB TAB Ectopic Multiple Live Births          1    # Outcome Date GA Lbr Len/2nd Weight Sex Delivery Anes PTL Lv  2 Current           1 Term 2008 [redacted]w[redacted]d  7 lb 1 oz (3.204 kg) M CS-LTranv EPI  LIV     Birth Comments: c/s due to something about potassium    Past Surgical  History: Past Surgical History:  Procedure Laterality Date  . CESAREAN SECTION  02/2006  . COLONOSCOPY N/A 06/11/2015   Procedure: COLONOSCOPY;  Surgeon: Leighton Ruff, MD;  [facility];  Service: General;  Laterality: N/A;  . COLOSTOMY N/A 05/18/2014   Procedure: COLOSTOMY;  Surgeon: Georganna Skeans, MD;  Location: [facility];  Service: General;  Laterality: N/A;  . COLOSTOMY REVISION N/A 05/18/2014   Procedure: COLON RESECTION SIGMOID;  Surgeon: Georganna Skeans, MD;  Location: [facility];  Service: General;  Laterality: N/A;  . COLPOSCOPY  Sep 2012  . LAPAROSCOPIC LYSIS OF ADHESIONS N/A 05/18/2014   Procedure: LAPAROSCOPIC MOBILIZATION OF SPLENIC FLEXURE;  Surgeon: Georganna Skeans, MD;  [facility];  Service: General;  Laterality: N/A;    Family History: Family History  Problem Relation Age of Onset  . HIV Mother     Social History: Social History   Tobacco Use  . Smoking status: Current Every Day Smoker    Packs/day: 0.50    Years: 16.00    Pack years: 8.00    Types: Cigarettes  . Smokeless tobacco: Never Used  Substance Use Topics  . Alcohol use: No    Alcohol/week: 0.0 oz  . Drug use: No    Allergies:  Allergies  Allergen Reactions  . Robaxin [Methocarbamol]  Swelling and Other (See Comments)    Bottom lip swelled  . Penicillins Itching, Rash and Other (See Comments)    Has patient had a PCN reaction causing immediate rash, facial/tongue/throat swelling, SOB or lightheadedness with hypotension: yes Has patient had a PCN reaction causing severe rash involving mucus membranes or skin necrosis: no Has patient had a PCN reaction that required hospitalization no Has patient had a PCN reaction occurring within the last 10 years: no If all of the above answers are "NO", then may proceed with Cephalosporin use.     Meds:  No medications prior to admission.    I have reviewed patient's Past Medical Hx, Surgical Hx, Family Hx, Social Hx, medications and allergies.   ROS:   Review of Systems  Constitutional: Negative for fever.  Gastrointestinal: Positive for abdominal pain. Negative for anorexia, constipation, diarrhea, nausea and vomiting.  Genitourinary: Negative for dysuria and frequency.  Musculoskeletal: Negative for myalgias.   Other systems negative  Physical Exam   Patient Vitals for the past 24 hrs:  BP Temp Temp src Pulse Resp Height Weight  04/18/17 0628 116/65 97.7 F (36.5 C) Oral 72 19 - -  04/17/17 2317 120/65 98.6 F (37 C) Oral 77 20 5' 6.5" (1.689 m) (!) 308 lb 4 oz (139.8 kg)   Constitutional: Well-developed, well-nourished female in no acute distress.  Cardiovascular: normal rate and rhythm Respiratory: normal effort, clear to auscultation bilaterally GI: Abd soft, tender over LLQ, gravid appropriate for gestational age.   No rebound or guarding.  Multiple surgical scars on abdomen.  MS: Extremities nontender, no edema, normal ROM Neurologic: Alert and oriented x 4.  GU: Neg CVAT.  PELVIC EXAM: Cervix long and closed.  No cervical motion tenderness. No unusual discharge   FHT:   160s per bedside US.  Labs: Results for orders placed or performed during the hospital encounter of 04/17/17 (from the past 24 hour(s))  Urinalysis, Routine w reflex microscopic     Status: Abnormal   Collection Time: 04/17/17 11:19 PM  Result Value Ref Range   Color, Urine YELLOW YELLOW   APPearance HAZY (A) CLEAR   Specific Gravity, Urine 1.032 (H) 1.005 - 1.030   pH 6.0 5.0 - 8.0   Glucose, UA NEGATIVE NEGATIVE mg/dL   Hgb urine dipstick SMALL (A) NEGATIVE   Bilirubin Urine NEGATIVE NEGATIVE   Ketones, ur 5 (A) NEGATIVE mg/dL   Protein, ur NEGATIVE NEGATIVE mg/dL   Nitrite NEGATIVE NEGATIVE   Leukocytes, UA NEGATIVE NEGATIVE   RBC / HPF 6-30 0 - 5 RBC/hpf   WBC, UA 0-5 0 - 5 WBC/hpf   Bacteria, UA RARE (A) NONE SEEN   Squamous Epithelial / LPF 6-30 (A) NONE SEEN   Mucus PRESENT    O/Positive/-- (04/03 1646)  Imaging:  US  Renal  Result Date: 04/18/2017 CLINICAL DATA:  Initial evaluation for hematuria, left-sided abdominal pain. EXAM: RENAL / URINARY TRACT ULTRASOUND COMPLETE COMPARISON:  Prior CT from 01/12/2017. FINDINGS: Right Kidney: Length: 10.2 cm. Echogenicity within normal limits. No mass or hydronephrosis visualized. Left Kidney: Length: 12.2 cm. Echogenicity within normal limits. No mass or hydronephrosis visualized. Bladder: Appears normal for degree of bladder distention. Bilateral ureteral jets visualized. Examination mildly limited by body habitus. IMPRESSION: Normal renal ultrasound. No sonographic evidence for nephrolithiasis. No hydronephrosis. Electronically Signed   By: Jeannine Boga M.D.   On: 04/18/2017 06:13    MAU Course/MDM: I have ordered labs and reviewed results. UA shows only hematuria.  Since  the [place] did not show any sign of obstruction, I will culture urine and treat presumptively for UTI. WIll try a dose of Pyridium.  Discussed with her that if this is urinary pain, it will get better with Pyridium.  If it does not help pain, don't bother taking it any more.  This pain could be intestinal, possibly her diverticulitis.  .    Assessment: 1. Left lower quadrant pain   2. Hematuria   3.     Presumed Urinary Tract Infection  Plan: Discharge home Urine to culture Rx Macrobid for UTI Rx Pyridium for dysuria Follow up in Office for prenatal visits and recheck If this does not help pain, patient is to contact her diverticulitis doctor.    Follow-up [facility] for [facility] Follow up.   Specialty:  Obstetrics and Gynecology Contact information: [place] [phone]        Encouraged to return here or to other Urgent Care/ED if she develops worsening of symptoms, increase in pain, fever, or other concerning symptoms.   Pt stable at time of discharge.  Hansel Feinstein CNM, MSN Certified  Nurse-Midwife 04/18/2017 7:59 AM

## 2017-04-18 NOTE — Discharge Instructions (Signed)
Diverticulitis °Diverticulitis is infection or inflammation of small pouches (diverticula) in the colon that form due to a condition called diverticulosis. Diverticula can trap stool (feces) and bacteria, causing infection and inflammation. °Diverticulitis may cause severe stomach pain and diarrhea. It may lead to tissue damage in the colon that causes bleeding. The diverticula may also burst (rupture) and cause infected stool to enter other areas of the abdomen. °Complications of diverticulitis can include: °· Bleeding. °· Severe infection. °· Severe pain. °· Rupture (perforation) of the colon. °· Blockage (obstruction) of the colon. ° °What are the causes? °This condition is caused by stool becoming trapped in the diverticula, which allows bacteria to grow in the diverticula. This leads to inflammation and infection. °What increases the risk? °You are more likely to develop this condition if: °· You have diverticulosis. The risk for diverticulosis increases if: °? You are overweight or obese. °? You use tobacco products. °? You do not get enough exercise. °· You eat a diet that does not include enough fiber. High-fiber foods include fruits, vegetables, beans, nuts, and whole grains. ° °What are the signs or symptoms? °Symptoms of this condition may include: °· Pain and tenderness in the abdomen. The pain is normally located on the left side of the abdomen, but it may occur in other areas. °· Fever and chills. °· Bloating. °· Cramping. °· Nausea. °· Vomiting. °· Changes in bowel routines. °· Blood in your stool. ° °How is this diagnosed? °This condition is diagnosed based on: °· Your medical history. °· A physical exam. °· Tests to make sure there is nothing else causing your condition. These tests may include: °? Blood tests. °? Urine tests. °? Imaging tests of the abdomen, including X-rays, ultrasounds, MRIs, or CT scans. ° °How is this treated? °Most cases of this condition are mild and can be treated at home.  Treatment may include: °· Taking over-the-counter pain medicines. °· Following a clear liquid diet. °· Taking antibiotic medicines by mouth. °· Rest. ° °More severe cases may need to be treated at a hospital. Treatment may include: °· Not eating or drinking. °· Taking prescription pain medicine. °· Receiving antibiotic medicines through an IV tube. °· Receiving fluids and nutrition through an IV tube. °· Surgery. ° °When your condition is under control, your health care provider may recommend that you have a colonoscopy. This is an exam to look at the entire large intestine. During the exam, a lubricated, bendable tube is inserted into the anus and then passed into the rectum, colon, and other parts of the large intestine. A colonoscopy can show how severe your diverticula are and whether something else may be causing your symptoms. °Follow these instructions at home: °Medicines °· Take over-the-counter and prescription medicines only as told by your health care provider. These include fiber supplements, probiotics, and stool softeners. °· If you were prescribed an antibiotic medicine, take it as told by your health care provider. Do not stop taking the antibiotic even if you start to feel better. °· Do not drive or use heavy machinery while taking prescription pain medicine. °General instructions °· Follow a full liquid diet or another diet as directed by your health care provider. After your symptoms improve, your health care provider may tell you to change your diet. He or she may recommend that you eat a diet that contains at least 25 g (25 grams) of fiber daily. Fiber makes it easier to pass stool. Healthy sources of fiber include: °? Berries. One cup   contains 4-8 grams of fiber. °? Beans or lentils. One half cup contains 5-8 grams of fiber. °? Green vegetables. One cup contains 4 grams of fiber. °· Exercise for at least 30 minutes, 3 times each week. You should exercise hard enough to raise your heart rate and  break a sweat. °· Keep all follow-up visits as told by your health care provider. This is important. You may need a colonoscopy. °Contact a health care provider if: °· Your pain does not improve. °· You have a hard time drinking or eating food. °· Your bowel movements do not return to normal. °Get help right away if: °· Your pain gets worse. °· Your symptoms do not get better with treatment. °· Your symptoms suddenly get worse. °· You have a fever. °· You vomit more than one time. °· You have stools that are bloody, black, or tarry. °Summary °· Diverticulitis is infection or inflammation of small pouches (diverticula) in the colon that form due to a condition called diverticulosis. Diverticula can trap stool (feces) and bacteria, causing infection and inflammation. °· You are at higher risk for this condition if you have diverticulosis and you eat a diet that does not include enough fiber. °· Most cases of this condition are mild and can be treated at home. More severe cases may need to be treated at a hospital. °· When your condition is under control, your health care provider may recommend that you have an exam called a colonoscopy. This exam can show how severe your diverticula are and whether something else may be causing your symptoms. °This information is not intended to replace advice given to you by your health care provider. Make sure you discuss any questions you have with your health care provider. °Document Released: 09/28/2004 Document Revised: 01/22/2016 Document Reviewed: 01/22/2016 °Elsevier Interactive Patient Education © 2018 [employer] ° °

## 2017-04-18 NOTE — Progress Notes (Signed)
Patient is being sent home on percocet 10 mg per dose x 10 doses (20 tabs of percocet 5). She has a second Rx to reduce to 7.5 mg per q6h dose (20 tabs) which she can fill on 4/20 to address pain thru weekend.

## 2017-04-18 NOTE — Telephone Encounter (Signed)
If its diverticulitis, OB GYN may want to start her on antibiotics that is considered safe during pregnancy instead of sending patient here

## 2017-04-18 NOTE — Telephone Encounter (Signed)
Janetta from OBGYN office at [facility] calling about this patient being seen. Janetta states no gi hx in pt but [redacted] weeks pregnant and complaining of diverticulitis flare up. Pt is self pay. Kevin Fenton states she will put referral in but we need to know if pt can be seen by our office. DOD for today 4.17.19 is Dr.Nandigam. Please advise on scheduling.

## 2017-04-18 NOTE — Discharge Instructions (Signed)

## 2017-04-18 NOTE — Telephone Encounter (Signed)
Patient would like a call back from nurse about her visit to MAU. Still in lots of pain.

## 2017-04-18 NOTE — Telephone Encounter (Addendum)
Pt comes to the front office with a concern that her diverticulitis is flaring up.  Pt reports that she is currently [redacted]wks pregnant and she went to [facility]U yesterday for abdominal pain.  When pt asked if she has a provider that manages her diverticulitis (per review of pt's chart) pt states that she has only been going to the ED and/or a surgeon for management in the past.  Verifying pts insurance, pt states that she has pregnancy Medicaid.  I explained to the pt that I will need to send a referral for GI but she will be considered to be self pay and will have to bring cash to the appt.  Pt then states that she does not have money for the appt but she will be enrolling in an insurance plan at her job tomorrow that will take 30 days to be effective.  I called and spoke with [facility] front office in regards to referral.  I was advised by [facility] front office staff that someone will contact me so that we can schedule an appt. for the pt.  I notified the pt that I would schedule an appt with [facility] in a month so that way it will give her time for her insurance to become effective and that in the meantime to please go to MAU for further evaluation for her pain.  I also advised pt to let MAU know that she thought it was her diverticulitis so that way they would be able to direct her care.  Pt stated thank you with no further questions.

## 2017-04-18 NOTE — MAU Note (Signed)
Iv attempt x 2 without success

## 2017-04-18 NOTE — MAU Note (Signed)
Pt given po pain meds but antibiotics not given at this time per provider due to pt is still undecided about what she wants to do.

## 2017-04-18 NOTE — MAU Note (Signed)
Diverticulitis, same pain.  Nothing is changing.  Knows it is not kidney stone, is diverticulitis.  Pain is unbearable. Feels like she is having contractions. Nothing is better.

## 2017-04-18 NOTE — MAU Provider Note (Addendum)
History     CSN: [id number]  Arrival date and time: 04/18/17 1229   First Provider Initiated Contact with Patient 04/18/17 1534      Chief Complaint  Patient presents with  . Abdominal Pain   HPI  Tina Riggs is a 37 y.o. G2P1001 at [redacted]w[redacted]d who presents with abdominal pain. This is her 2nd MAU visit in 24 hours with same complaint. Patient with hx significant for diverticulitis. She reports 3+ episodes of diverticulitis, all requiring admission & a hx of colostomy in 2016. Reports that current symptoms feel just like other episodes of diverticulitis. Reports LLQ pain that is sharp and cramp like. Rates pain 10/10. Has not treated symptoms. Movement makes pain worse. Last BM was 2 days ago, she thinks, but unsure. Has been feeling bloated for the last few weeks but thought it was due to pregnancy. Denies n/v/d, fever/chills, or vaginal bleeding.   OB History    Gravida  2   Para  1   Term  1   Preterm      AB      Living  1     SAB      TAB      Ectopic      Multiple      Live Births  1           Past Medical History:  Diagnosis Date  . Diverticulitis    hospitalized 04/13/2014; hospitalized 04/29/2014  . HPV in female   . Vaginal Pap smear, abnormal     Past Surgical History:  Procedure Laterality Date  . CESAREAN SECTION  02/2006  . COLONOSCOPY N/A 06/11/2015   Procedure: COLONOSCOPY;  Surgeon: Leighton Ruff, MD;  Location: [facility];  Service: General;  Laterality: N/A;  . COLOSTOMY N/A 05/18/2014   Procedure: COLOSTOMY;  Surgeon: Georganna Skeans, MD;  Location: [facility];  Service: General;  Laterality: N/A;  . COLOSTOMY REVISION N/A 05/18/2014   Procedure: COLON RESECTION SIGMOID;  Surgeon: Georganna Skeans, MD;  Location: [facility];  Service: General;  Laterality: N/A;  . COLPOSCOPY  Sep 2012  . LAPAROSCOPIC LYSIS OF ADHESIONS N/A 05/18/2014   Procedure: LAPAROSCOPIC MOBILIZATION OF SPLENIC FLEXURE;  Surgeon: Georganna Skeans, MD;  Location: [facility];  Service: General;   Laterality: N/A;    Family History  Problem Relation Age of Onset  . HIV Mother     Social History   Tobacco Use  . Smoking status: Current Every Day Smoker    Packs/day: 0.50    Years: 16.00    Pack years: 8.00    Types: Cigarettes  . Smokeless tobacco: Never Used  Substance Use Topics  . Alcohol use: No    Alcohol/week: 0.0 oz  . Drug use: No    Allergies:  Allergies  Allergen Reactions  . Robaxin [Methocarbamol] Swelling and Other (See Comments)    Bottom lip swelled  . Penicillins Itching, Rash and Other (See Comments)    Has patient had a PCN reaction causing immediate rash, facial/tongue/throat swelling, SOB or lightheadedness with hypotension: yes Has patient had a PCN reaction causing severe rash involving mucus membranes or skin necrosis: no Has patient had a PCN reaction that required hospitalization no Has patient had a PCN reaction occurring within the last 10 years: no If all of the above answers are "NO", then may proceed with Cephalosporin use.     Medications Prior to Admission  Medication Sig Dispense Refill Last Dose  . nitrofurantoin, macrocrystal-monohydrate, (MACROBID) 100 MG  capsule Take 1 capsule (100 mg total) by mouth 2 (two) times daily. 14 capsule 1   . phenazopyridine (PYRIDIUM) 100 MG tablet Take 1 tablet (100 mg total) by mouth every 8 (eight) hours as needed for pain. 10 tablet 0   . Prenatal Vit-Fe Fumarate-FA (PNV PRENATAL PLUS MULTIVITAMIN) 27-1 MG TABS TAKE 1 TABLET BY MOUTH DAILY. 30 tablet 12 Taking    Review of Systems  Constitutional: Negative.   Gastrointestinal: Positive for abdominal distention, abdominal pain and constipation. Negative for blood in stool, diarrhea and vomiting.  Genitourinary: Negative.    Physical Exam   Blood pressure 122/77, pulse 97, temperature 98.6 F (37 C), temperature source Oral, resp. rate 20, last menstrual period 01/17/2017.  Physical Exam  Nursing note and vitals reviewed. Constitutional:  She is oriented to person, place, and time. She appears well-developed and well-nourished. No distress.  HENT:  Head: Normocephalic and atraumatic.  Eyes: Conjunctivae are normal. Right eye exhibits no discharge. Left eye exhibits no discharge. No scleral icterus.  Neck: Normal range of motion.  Cardiovascular: Normal rate, regular rhythm and normal heart sounds.  No murmur heard. Respiratory: Effort normal and breath sounds normal. No respiratory distress. She has no wheezes.  GI: Soft. She exhibits distension. Bowel sounds are decreased. There is tenderness in the left lower quadrant. There is rebound. There is no rigidity and no guarding.  Genitourinary:  Genitourinary Comments: Cervix closed/thick  Neurological: She is alert and oriented to person, place, and time.  Skin: Skin is warm and dry. She is not diaphoretic.  Psychiatric: She has a normal mood and affect. Her behavior is normal. Judgment and thought content normal.    MAU Course  Procedures Results for orders placed or performed during the hospital encounter of 04/18/17 (from the past 24 hour(s))  CBC     Status: Abnormal   Collection Time: 04/18/17  1:22 PM  Result Value Ref Range   WBC 11.3 (H) 4.0 - 10.5 K/uL   RBC 3.97 3.87 - 5.11 MIL/uL   Hemoglobin 11.9 (L) 12.0 - 15.0 g/dL   HCT 35.7 (L) 36.0 - 46.0 %   MCV 89.9 78.0 - 100.0 fL   MCH 30.0 26.0 - 34.0 pg   MCHC 33.3 30.0 - 36.0 g/dL   RDW 13.9 11.5 - 15.5 %   Platelets 315 150 - 400 K/uL  Comprehensive metabolic panel     Status: Abnormal   Collection Time: 04/18/17  1:22 PM  Result Value Ref Range   Sodium 134 (L) 135 - 145 mmol/L   Potassium 3.9 3.5 - 5.1 mmol/L   Chloride 105 101 - 111 mmol/L   CO2 20 (L) 22 - 32 mmol/L   Glucose, Bld 98 65 - 99 mg/dL   BUN 9 6 - 20 mg/dL   Creatinine, Ser 0.56 0.44 - 1.00 mg/dL   Calcium 9.1 8.9 - 10.3 mg/dL   Total Protein 6.4 (L) 6.5 - 8.1 g/dL   Albumin 3.0 (L) 3.5 - 5.0 g/dL   AST 19 15 - 41 U/L   ALT 15 14 - 54  U/L   Alkaline Phosphatase 80 38 - 126 U/L   Total Bilirubin 0.3 0.3 - 1.2 mg/dL   GFR calc non Af Amer >60 >60 mL/min   GFR calc Af Amer >60 >60 mL/min   Anion gap 9 5 - 15    MDM Unable to hear FHT by doppler d/t maternal body habitus and pain. Bedside ultrasound performed, active fetus w/FHR 160s  Pt informed that the ultrasound is considered a limited OB ultrasound and is not intended to be a complete ultrasound exam.  Patient also informed that the ultrasound is not being completed with the intent of assessing for fetal or placental anomalies or any pelvic abnormalities.  Explained that the purpose of today's ultrasound is to assess for  viability.  Patient acknowledges the purpose of the exam and the limitations of the study.    Patient afebrile. CBC --- WBC slightly elevated, comparable to yesterday's lab C/w Dr. Nehemiah Settle. Per Radiology, MRI not recommended d/t patient's size & hx of colonic surgery. Dr. Nehemiah Settle assessed patient & discussed option of CT w/risks vs benefits, or outpatient abx with close follow up. Pt opts for outpatient abx. Per pt, hx of itching reaction to pcn 10+ years ago and reports having taken amoxicillin since then without reaction. Dr. Nehemiah Settle aware and states to proceed with plan for augmentin.   I spoke with patient regarding meds to be given prior to discharge and verifying her allergies. Patient is now unsure about what she wants to do and would like to speak with MD again about possible CT. Concerned d/t hx of being admitted always after initially being discharged.   Discussed patient with Dr. Glo Herring who is now on call. Will come speak with patient about options and plan of care.  Assessment and Plan  A:  1. Diverticulitis   2. [redacted] weeks gestation of pregnancy    P: Dr. Glo Herring on unit to speak with patient Patient ok with plan to discharge home. Will d/c home with rx for augmentin, percocet, and diflucan.  If symptoms worsen or fever develops, pt  instructed to go to [facility] or [facility] Per Dr. Glo Herring, pt to have close f/u in office tomorrow to reassess -- ensure no fever or worsening condition. Urgent msg sent to admin pool regarding appt, Told patient that tentatively she will need to come at 1:15 pm d/t there being an open slot but the office may call her if this needs to be changed.   Jorje Guild 04/18/2017, 3:34 PM

## 2017-04-18 NOTE — Progress Notes (Signed)
Provider at bs assessing pt. 2nd IV insertion attempt held off dt discussion of POC.

## 2017-04-19 ENCOUNTER — Encounter: Payer: Self-pay | Admitting: Obstetrics & Gynecology

## 2017-04-19 ENCOUNTER — Ambulatory Visit (INDEPENDENT_AMBULATORY_CARE_PROVIDER_SITE_OTHER): Payer: Medicaid Other | Admitting: Obstetrics & Gynecology

## 2017-04-19 ENCOUNTER — Encounter: Payer: Self-pay | Admitting: General Practice

## 2017-04-19 VITALS — BP 120/70 | HR 78 | Wt 305.9 lb

## 2017-04-19 DIAGNOSIS — O99210 Obesity complicating pregnancy, unspecified trimester: Secondary | ICD-10-CM

## 2017-04-19 DIAGNOSIS — Z3492 Encounter for supervision of normal pregnancy, unspecified, second trimester: Secondary | ICD-10-CM

## 2017-04-19 DIAGNOSIS — K5792 Diverticulitis of intestine, part unspecified, without perforation or abscess without bleeding: Secondary | ICD-10-CM

## 2017-04-19 LAB — CULTURE, OB URINE: Culture: NO GROWTH

## 2017-04-19 MED ORDER — BISACODYL 10 MG RE SUPP: 10.0000 mg | RECTAL | 0 refills | Status: DC | PRN

## 2017-04-19 NOTE — Patient Instructions (Signed)
Polyethylene Glycol powder What is this medicine? POLYETHYLENE GLYCOL 3350 (pol ee ETH i leen; GLYE col) powder is a laxative used to treat constipation. It increases the amount of water in the stool. Bowel movements become easier and more frequent. This medicine may be used for other purposes; ask your health care provider or pharmacist if you have questions. COMMON BRAND NAME(S): GaviLax, GIALAX, GlycoLax, MiraLax, Smooth LAX, [employer] What should I tell my health care provider before I take this medicine? They need to know if you have any of these conditions: -a history of blockage of the stomach or intestine -current abdomen distension or pain -difficulty swallowing -diverticulitis, ulcerative colitis, or other chronic bowel disease -phenylketonuria -an unusual or allergic reaction to polyethylene glycol, other medicines, dyes, or preservatives -pregnant or trying to get pregnant -breast-feeding How should I use this medicine? Take this medicine by mouth. The bottle has a measuring cap that is marked with a line. Pour the powder into the cap up to the marked line (the dose is about 1 heaping tablespoon). Add the powder in the cap to a full glass (4 to 8 ounces or 120 to 240 ml) of water, juice, soda, coffee or tea. Mix the powder well. Drink the solution. Take exactly as directed. Do not take your medicine more often than directed. Talk to your pediatrician regarding the use of this medicine in children. Special care may be needed. Overdosage: If you think you have taken too much of this medicine contact a poison control center or emergency room at once. NOTE: This medicine is only for you. Do not share this medicine with others. What if I miss a dose? If you miss a dose, take it as soon as you can. If it is almost time for your next dose, take only that dose. Do not take double or extra doses. What may interact with this medicine? Interactions are not expected. This list may not  describe all possible interactions. Give your health care provider a list of all the medicines, herbs, non-prescription drugs, or dietary supplements you use. Also tell them if you smoke, drink alcohol, or use illegal drugs. Some items may interact with your medicine. What should I watch for while using this medicine? Do not use for more than 2 weeks without advice from your doctor or health care professional. It can take 2 to 4 days to have a bowel movement and to experience improvement in constipation. See your health care professional for any changes in bowel habits, including constipation, that are severe or last longer than three weeks. Always take this medicine with plenty of water. What side effects may I notice from receiving this medicine? Side effects that you should report to your doctor or health care professional as soon as possible: -diarrhea -difficulty breathing -itching of the skin, hives, or skin rash -severe bloating, pain, or distension of the stomach -vomiting Side effects that usually do not require medical attention (report to your doctor or health care professional if they continue or are bothersome): -bloating or gas -lower abdominal discomfort or cramps -nausea This list may not describe all possible side effects. Call your doctor for medical advice about side effects. You may report side effects to FDA at 1-800-FDA-1088. Where should I keep my medicine? Keep out of the reach of children. Store between 15 and 30 degrees C (59 and 86 degrees F). Throw away any unused medicine after the expiration date. NOTE: This sheet is a summary. It may not cover all   possible information. If you have questions about this medicine, talk to your doctor, pharmacist, or health care provider.  2018 Elsevier/Gold Standard (2007-07-22 16:50:45)  

## 2017-04-19 NOTE — Telephone Encounter (Signed)
Discussed with the nurse at the clinic. She also asked what antibiotics do we typically treat with. Discussed Cipro and Flagyl.

## 2017-04-19 NOTE — Progress Notes (Signed)
History:  37 y.o. G2P1001 here today for exacerbation of diverticulitis. She was actually recently seen in the MAU and this is a f/u to see if her sx have improved. She reports overall improvement of her sx.   The Bragg's amino acids is working but, she is on Augmentin but, she is having some itching.  Pt denies SOB or tongue swelling. She has no rashes and she says the itching is improved and is not severe.   The following portions of the patient's history were reviewed and updated as appropriate: allergies, current medications, past family history, past medical history, past social history, past surgical history and problem list.  Review of Systems:  Pertinent items are noted in HPI.    Objective:  Physical Exam Blood pressure 120/70, pulse 78, weight (!) 305 lb 14.4 oz (138.8 kg), last menstrual period 01/17/2017.  CONSTITUTIONAL: Well-developed, well-nourished female in no acute distress.  HENT:  Normocephalic, atraumatic EYES: Conjunctivae and EOM are normal. No scleral icterus.  NECK: Normal range of motion SKIN: Skin is warm and dry. No rash noted. Not diaphoretic.No pallor. [place]: Alert and oriented to person, place, and time. Normal coordination.   Labs and Imaging US Renal  Result Date: 04/18/2017 CLINICAL DATA:  Initial evaluation for hematuria, left-sided abdominal pain. EXAM: RENAL / URINARY TRACT ULTRASOUND COMPLETE COMPARISON:  Prior CT from 01/12/2017. FINDINGS: Right Kidney: Length: 10.2 cm. Echogenicity within normal limits. No mass or hydronephrosis visualized. Left Kidney: Length: 12.2 cm. Echogenicity within normal limits. No mass or hydronephrosis visualized. Bladder: Appears normal for degree of bladder distention. Bilateral ureteral jets visualized. Examination mildly limited by body habitus. IMPRESSION: Normal renal ultrasound. No sonographic evidence for nephrolithiasis. No hydronephrosis. Electronically Signed   By: Jeannine Boga M.D.   On: 04/18/2017 06:13     Assessment & Plan:  Diverticulitis exacerbation  Keep Augmentin  F/u for routine OB care  Benadryl prn  Rec stopping meds if her sx worsen  Pregnancy incidental   Total face-to-face time with patient was 15 min.  Greater than 50% was spent in counseling and coordination of care with the patient.   Nick Armel L. Harraway-Smith, M.D., Cherlynn June

## 2017-04-19 NOTE — Progress Notes (Unsigned)
[facility] called and states their doctor doesn't want to see the patient because she is pregnant and they are unsure what medications/procedures are safe for the patient. They will see the patient in the future when she isn't pregnant. The nurse from [facility] states they usually start patient's on a 10 day course of flagyl & cipro. Will forward message to Dr Glo Herring as he saw patient in MAU.

## 2017-04-20 ENCOUNTER — Inpatient Hospital Stay (HOSPITAL_COMMUNITY)
Admission: EM | Admit: 2017-04-20 | Discharge: 2017-04-23 | DRG: 832 | Disposition: A | Discharge status: Home / Self Care | Payer: Medicaid Other | Attending: Internal Medicine | Admitting: Internal Medicine

## 2017-04-20 ENCOUNTER — Other Ambulatory Visit: Payer: Self-pay

## 2017-04-20 ENCOUNTER — Encounter (HOSPITAL_COMMUNITY): Payer: Self-pay | Admitting: Emergency Medicine

## 2017-04-20 DIAGNOSIS — O98811 Other maternal infectious and parasitic diseases complicating pregnancy, first trimester: Secondary | ICD-10-CM | POA: Diagnosis present

## 2017-04-20 DIAGNOSIS — O99611 Diseases of the digestive system complicating pregnancy, first trimester: Principal | ICD-10-CM | POA: Diagnosis present

## 2017-04-20 DIAGNOSIS — R109 Unspecified abdominal pain: Secondary | ICD-10-CM | POA: Diagnosis present

## 2017-04-20 DIAGNOSIS — K9289 Other specified diseases of the digestive system: Secondary | ICD-10-CM | POA: Diagnosis present

## 2017-04-20 DIAGNOSIS — O99331 Smoking (tobacco) complicating pregnancy, first trimester: Secondary | ICD-10-CM | POA: Diagnosis present

## 2017-04-20 DIAGNOSIS — Z720 Tobacco use: Secondary | ICD-10-CM

## 2017-04-20 DIAGNOSIS — K5792 Diverticulitis of intestine, part unspecified, without perforation or abscess without bleeding: Secondary | ICD-10-CM | POA: Diagnosis present

## 2017-04-20 DIAGNOSIS — O99211 Obesity complicating pregnancy, first trimester: Secondary | ICD-10-CM | POA: Diagnosis present

## 2017-04-20 DIAGNOSIS — F17210 Nicotine dependence, cigarettes, uncomplicated: Secondary | ICD-10-CM | POA: Diagnosis present

## 2017-04-20 DIAGNOSIS — Z3A13 13 weeks gestation of pregnancy: Secondary | ICD-10-CM

## 2017-04-20 DIAGNOSIS — E669 Obesity, unspecified: Secondary | ICD-10-CM | POA: Diagnosis present

## 2017-04-20 DIAGNOSIS — K5900 Constipation, unspecified: Secondary | ICD-10-CM | POA: Diagnosis present

## 2017-04-20 DIAGNOSIS — B373 Candidiasis of vulva and vagina: Secondary | ICD-10-CM | POA: Diagnosis present

## 2017-04-20 LAB — COMPREHENSIVE METABOLIC PANEL
ALT: 17 U/L (ref 14–54)
ANION GAP: 11 (ref 5–15)
AST: 18 U/L (ref 15–41)
Albumin: 2.9 g/dL — ABNORMAL LOW (ref 3.5–5.0)
Alkaline Phosphatase: 76 U/L (ref 38–126)
BUN: <5 mg/dL — ABNORMAL LOW (ref 6–20)
CHLORIDE: 104 mmol/L (ref 101–111)
CO2: 19 mmol/L — AB (ref 22–32)
Calcium: 9 mg/dL (ref 8.9–10.3)
Creatinine, Ser: 0.63 mg/dL (ref 0.44–1.00)
GFR calc non Af Amer: >60 mL/min (ref >60)
Glucose, Bld: 111 mg/dL — ABNORMAL HIGH (ref 65–99)
POTASSIUM: 3.7 mmol/L (ref 3.5–5.1)
SODIUM: 134 mmol/L — AB (ref 135–145)
Total Bilirubin: 0.4 mg/dL (ref 0.3–1.2)
Total Protein: 6.6 g/dL (ref 6.5–8.1)

## 2017-04-20 LAB — URINALYSIS, ROUTINE W REFLEX MICROSCOPIC
Bilirubin Urine: NEGATIVE
GLUCOSE, UA: NEGATIVE mg/dL
Ketones, ur: NEGATIVE mg/dL
Leukocytes, UA: NEGATIVE
Nitrite: NEGATIVE
Protein, ur: NEGATIVE mg/dL
Specific Gravity, Urine: 1.012 (ref 1.005–1.030)
pH: 6 (ref 5.0–8.0)

## 2017-04-20 LAB — CREATININE, SERUM: Creatinine, Ser: 0.59 mg/dL (ref 0.44–1.00)

## 2017-04-20 LAB — CBC
HCT: 35 % — ABNORMAL LOW (ref 36.0–46.0)
HEMATOCRIT: 35.9 % — AB (ref 36.0–46.0)
Hemoglobin: 11.8 g/dL — ABNORMAL LOW (ref 12.0–15.0)
Hemoglobin: 11.7 g/dL — ABNORMAL LOW (ref 12.0–15.0)
MCH: 29.5 pg (ref 26.0–34.0)
MCH: 29.9 pg (ref 26.0–34.0)
MCHC: 32.9 g/dL (ref 30.0–36.0)
MCHC: 33.4 g/dL (ref 30.0–36.0)
MCV: 89.8 fL (ref 78.0–100.0)
MCV: 89.5 fL (ref 78.0–100.0)
PLATELETS: 320 10*3/uL (ref 150–400)
Platelets: 333 10*3/uL (ref 150–400)
RBC: 4 MIL/uL (ref 3.87–5.11)
RBC: 3.91 MIL/uL (ref 3.87–5.11)
RDW: 13.9 % (ref 11.5–15.5)
RDW: 14 % (ref 11.5–15.5)
WBC: 11.4 10*3/uL — ABNORMAL HIGH (ref 4.0–10.5)
WBC: 12.7 10*3/uL — AB (ref 4.0–10.5)

## 2017-04-20 LAB — I-STAT BETA HCG BLOOD, ED (MC, WL, AP ONLY): I-stat hCG, quantitative: >2000 m[IU]/mL — ABNORMAL HIGH (ref <5)

## 2017-04-20 LAB — LIPASE, BLOOD: LIPASE: 17 U/L (ref 11–51)

## 2017-04-20 MED ORDER — HYDROMORPHONE HCL 2 MG/ML IJ SOLN
0.5000 mg | Freq: Once | INTRAMUSCULAR | Status: AC
  Administered 2017-04-20: 0.5 mg via INTRAVENOUS
  Filled 2017-04-20: qty 1

## 2017-04-20 MED ORDER — SODIUM CHLORIDE 0.9 % IV BOLUS
1000.0000 mL | Freq: Once | INTRAVENOUS | Status: AC
  Administered 2017-04-20: 1000 mL via INTRAVENOUS

## 2017-04-20 MED ORDER — SODIUM CHLORIDE 0.9 % IV SOLN
INTRAVENOUS | Status: DC
  Administered 2017-04-20 – 2017-04-23 (×4): via INTRAVENOUS

## 2017-04-20 MED ORDER — HYDROMORPHONE HCL 1 MG/ML PO LIQD: 0.5000 mg | ORAL | Status: DC | PRN

## 2017-04-20 MED ORDER — MORPHINE SULFATE (PF) 4 MG/ML IV SOLN
2.0000 mg | INTRAVENOUS | Status: DC | PRN
  Administered 2017-04-20: 2 mg via INTRAVENOUS
  Filled 2017-04-20: qty 1

## 2017-04-20 MED ORDER — ACETAMINOPHEN 650 MG RE SUPP: 650.0000 mg | Freq: Four times a day (QID) | RECTAL | Status: DC | PRN

## 2017-04-20 MED ORDER — ONDANSETRON HCL 4 MG PO TABS: 4.0000 mg | ORAL_TABLET | Freq: Four times a day (QID) | ORAL | Status: DC | PRN

## 2017-04-20 MED ORDER — HYDROMORPHONE HCL 2 MG/ML IJ SOLN
0.5000 mg | INTRAMUSCULAR | Status: DC | PRN
  Administered 2017-04-20 (×2): 0.5 mg via INTRAVENOUS
  Filled 2017-04-20 (×2): qty 1

## 2017-04-20 MED ORDER — ACETAMINOPHEN 325 MG PO TABS
650.0000 mg | ORAL_TABLET | Freq: Four times a day (QID) | ORAL | Status: DC | PRN
  Administered 2017-04-20: 650 mg via ORAL
  Filled 2017-04-20: qty 2

## 2017-04-20 MED ORDER — PIPERACILLIN-TAZOBACTAM 3.375 G IVPB
3.3750 g | Freq: Three times a day (TID) | INTRAVENOUS | Status: DC
  Administered 2017-04-20 – 2017-04-23 (×8): 3.375 g via INTRAVENOUS
  Filled 2017-04-20 (×10): qty 50

## 2017-04-20 MED ORDER — PIPERACILLIN-TAZOBACTAM 3.375 G IVPB 30 MIN
3.3750 g | Freq: Once | INTRAVENOUS | Status: AC
  Administered 2017-04-20: 3.375 g via INTRAVENOUS
  Filled 2017-04-20: qty 50

## 2017-04-20 MED ORDER — ONDANSETRON HCL 4 MG/2ML IJ SOLN: 4.0000 mg | Freq: Four times a day (QID) | INTRAMUSCULAR | Status: DC | PRN

## 2017-04-20 MED ORDER — WHITE PETROLATUM EX OINT
TOPICAL_OINTMENT | CUTANEOUS | Status: AC
  Administered 2017-04-20: 17:00:00
  Filled 2017-04-20: qty 28.35

## 2017-04-20 MED ORDER — ENOXAPARIN SODIUM 40 MG/0.4ML ~~LOC~~ SOLN
40.0000 mg | SUBCUTANEOUS | Status: DC
  Administered 2017-04-20 – 2017-04-22 (×3): 40 mg via SUBCUTANEOUS
  Filled 2017-04-20 (×4): qty 0.4

## 2017-04-20 MED ORDER — PRENATAL PLUS 27-1 MG PO TABS
1.0000 | ORAL_TABLET | Freq: Every day | ORAL | Status: DC
  Administered 2017-04-21 – 2017-04-23 (×3): 1 via ORAL
  Filled 2017-04-20 (×3): qty 1

## 2017-04-20 MED ORDER — HYDROMORPHONE HCL 2 MG/ML IJ SOLN
1.0000 mg | INTRAMUSCULAR | Status: DC | PRN
  Administered 2017-04-20 – 2017-04-22 (×11): 1 mg via INTRAVENOUS
  Filled 2017-04-20 (×10): qty 1

## 2017-04-20 NOTE — ED Provider Notes (Signed)
[facility] EMERGENCY DEPARTMENT Provider Note   CSN: [id number] Arrival date & time: 04/20/17  0805     History   Chief Complaint Chief Complaint  Patient presents with  . Diverticulitis  . Routine Prenatal Visit  . Abdominal Pain  . Abdominal Cramping    HPI   Blood pressure 116/71, pulse 86, temperature 99.2 F (37.3 C), temperature source Oral, resp. rate 16, height 5\' 6"  (1.676 m), weight (!) 138.3 kg (305 lb), last menstrual period 01/17/2017, SpO2 100 %.  Tina Riggs is a 37 y.o. female with past medical history significant for morbid obesity G2P1 [redacted] weeks pregnant waning of severe left lower quadrant pain with abdominal distention and with no bowel movement and nausea with no vomiting over the course of the last 3 days consistent with prior episodes of diverticulitis that was complicated by perforation and required colostomy.  Pain is severe, 10 out of 10 has had multiple visits to [facility] for similar, recommended admission but she declined.  Worsening and initially was in the left lower quadrant but now is diffuse throughout the abdomen.  She has been compliant with the Augmentin and symptoms are not improving.  She denies vaginal discharge, vaginal bleeding.  She is been taking Percocet at home with little relief.  Patient does not having bowel movements but she is passing flatus.  No PCP  Past Medical History:  Diagnosis Date  . Diverticulitis    hospitalized 04/13/2014; hospitalized 04/29/2014  . HPV in female   . Vaginal Pap smear, abnormal     Patient Active Problem List   Diagnosis Date Noted  . Abdominal pain 04/20/2017  . Morbid obesity with BMI of 45.0-49.9, adult ([place]) 04/11/2017  . Advanced maternal age in multigravida 04/11/2017  . Supervision of low-risk pregnancy 04/04/2017  . Obesity affecting pregnancy, antepartum 04/04/2017  . 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 Chlamydia 04/14/2014  . Diverticulitis of colon with perforation  04/13/2014  . Tobacco use 04/13/2014  . Alcohol use 04/13/2014    Past Surgical History:  Procedure Laterality Date  . CESAREAN SECTION  02/2006  . COLONOSCOPY N/A 06/11/2015   Procedure: COLONOSCOPY;  Surgeon: Leighton Ruff, MD;  [facility];  Service: General;  Laterality: N/A;  . COLOSTOMY N/A 05/18/2014   Procedure: COLOSTOMY;  Surgeon: Georganna Skeans, MD;  Location: [facility];  Service: General;  Laterality: N/A;  . COLOSTOMY REVISION N/A 05/18/2014   Procedure: COLON RESECTION SIGMOID;  Surgeon: Georganna Skeans, MD;  Location: [place];  Service: General;  Laterality: N/A;  . COLPOSCOPY  Sep 2012  . LAPAROSCOPIC LYSIS OF ADHESIONS N/A 05/18/2014   Procedure: LAPAROSCOPIC MOBILIZATION OF SPLENIC FLEXURE;  Surgeon: Georganna Skeans, MD;  Location: [place];  Service: General;  Laterality: N/A;     OB History    Gravida  2   Para  1   Term  1   Preterm      AB      Living  1     SAB      TAB      Ectopic      Multiple      Live Births  1            Home Medications    Prior to Admission medications   Medication Sig Start Date End Date Taking? Authorizing Provider  amoxicillin-clavulanate (AUGMENTIN) 875-125 MG tablet Take 1 tablet by mouth every 12 (twelve) hours for 10 days. 04/18/17 04/28/17 Yes Jorje Guild, NP  bisacodyl (DULCOLAX) 10 MG suppository Place 1 suppository (10 mg total) rectally as needed for moderate constipation. 04/19/17  Yes Lavonia Drafts, MD  fluconazole (DIFLUCAN) 150 MG tablet Take 150 mg by mouth once.   Yes [provider]  nitrofurantoin, macrocrystal-monohydrate, (MACROBID) 100 MG capsule Take 100 mg by mouth 2 (two) times daily.   Yes [provider]  oxyCODONE-acetaminophen (PERCOCET/ROXICET) 5-325 MG tablet Take 2 tablets by mouth every 6 (six) hours as needed for moderate pain or severe pain.   Yes [provider]  phenazopyridine (PYRIDIUM) 100 MG tablet Take 100 mg by mouth 3 (three) times daily as  needed for pain.   Yes [provider]  Prenatal Vit-Fe Fumarate-FA (PNV PRENATAL PLUS MULTIVITAMIN) 27-1 MG TABS TAKE 1 TABLET BY MOUTH DAILY. 03/01/17  Yes Donnamae Jude, MD    Family History Family History  Problem Relation Age of Onset  . HIV Mother     Social History Social History   Tobacco Use  . Smoking status: Current Every Day Smoker    Packs/day: 0.50    Years: 16.00    Pack years: 8.00    Types: Cigarettes  . Smokeless tobacco: Never Used  Substance Use Topics  . Alcohol use: No    Alcohol/week: 0.0 oz  . Drug use: No     Allergies   Robaxin [methocarbamol] and Penicillins   Review of Systems Review of Systems  A complete review of systems was obtained and all systems are negative except as noted in the HPI and PMH.   Physical Exam Updated Vital Signs BP 137/62   Pulse 81   Temp 99.2 F (37.3 C) (Oral)   Resp 20   Ht 5\' 6"  (1.676 m)   Wt (!) 138.3 kg (305 lb)   LMP 01/17/2017   SpO2 100%   BMI 49.23 kg/m   Physical Exam  Constitutional: She is oriented to person, place, and time. She appears well-developed and well-nourished. No distress.  Obese appears uncomfortable.  HENT:  Head: Normocephalic and atraumatic.  Mouth/Throat: Oropharynx is clear and moist.  Eyes: Pupils are equal, round, and reactive to light. Conjunctivae and EOM are normal.  Neck: Normal range of motion.  Cardiovascular: Normal rate, regular rhythm and intact distal pulses.  Pulmonary/Chest: Effort normal and breath sounds normal.  Abdominal: Soft. She exhibits no distension and no mass. There is tenderness. There is no rebound and no guarding. No hernia.  Bowel sounds difficult to appreciate, possibly secondary to habitus  Diffusely tender to palpation with rebound tenderness, positive reverse Rovsing's.  Musculoskeletal: Normal range of motion.  Neurological: She is alert and oriented to person, place, and time.  Skin: Capillary refill takes less than 2  seconds. She is not diaphoretic.  Psychiatric: She has a normal mood and affect.  Nursing note and vitals reviewed.    ED Treatments / Results  Labs (all labs ordered are listed, but only abnormal results are displayed) Labs Reviewed  COMPREHENSIVE METABOLIC PANEL - Abnormal; Notable for the following components:      Result Value   Sodium 134 (*)    CO2 19 (*)    Glucose, Bld 111 (*)    BUN <5 (*)    Albumin 2.9 (*)    All other components within normal limits  CBC - Abnormal; Notable for the following components:   WBC 11.4 (*)    Hemoglobin 11.8 (*)    HCT 35.9 (*)    All other components within normal  limits  URINALYSIS, ROUTINE W REFLEX MICROSCOPIC - Abnormal; Notable for the following components:   APPearance HAZY (*)    Hgb urine dipstick MODERATE (*)    Bacteria, UA RARE (*)    Squamous Epithelial / LPF 6-30 (*)    All other components within normal limits  CBC - Abnormal; Notable for the following components:   WBC 12.7 (*)    Hemoglobin 11.7 (*)    HCT 35.0 (*)    All other components within normal limits  I-STAT BETA HCG BLOOD, ED ([facility], [facility], [facility] ONLY) - Abnormal; Notable for the following components:   I-stat hCG, quantitative >2,000.0 (*)    All other components within normal limits  LIPASE, BLOOD  CREATININE, SERUM  HIV ANTIBODY (ROUTINE TESTING)    EKG None  Radiology No results found.  Procedures Procedures (including critical care time)  Medications Ordered in ED Medications  PNV PRENATAL PLUS MULTIVITAMIN 27-1 MG TABS 1 tablet (has no administration in time range)  enoxaparin (LOVENOX) injection 40 mg (has no administration in time range)  0.9 %  sodium chloride infusion (has no administration in time range)  acetaminophen (TYLENOL) tablet 650 mg (has no administration in time range)    Or  acetaminophen (TYLENOL) suppository 650 mg (has no administration in time range)  ondansetron (ZOFRAN) tablet 4 mg (has no administration in time range)     Or  ondansetron (ZOFRAN) injection 4 mg (has no administration in time range)  morphine 4 MG/ML injection 2 mg (2 mg Intravenous Given 04/20/17 1530)  piperacillin-tazobactam (ZOSYN) IVPB 3.375 g (has no administration in time range)  piperacillin-tazobactam (ZOSYN) IVPB 3.375 g (0 g Intravenous Stopped 04/20/17 1521)  sodium chloride 0.9 % bolus 1,000 mL (1,000 mLs Intravenous New Bag/Given 04/20/17 1423)  HYDROmorphone (DILAUDID) injection 0.5 mg (0.5 mg Intravenous Given 04/20/17 1422)     Initial Impression / Assessment and Plan / ED Course  I have reviewed the triage vital signs and the nursing notes.  Pertinent labs & imaging results that were available during my care of the patient were reviewed by me and considered in my medical decision making (see chart for details).     Vitals:   04/20/17 1041 04/20/17 1321 04/20/17 1330 04/20/17 1519  BP: 116/71 131/79 131/82 137/62  Pulse: 86 92 92 81  Resp: 16 16  20   Temp: 99.2 F (37.3 C)     TempSrc: Oral     SpO2: 100% 98% 98% 100%  Weight:      Height:        Medications  PNV PRENATAL PLUS MULTIVITAMIN 27-1 MG TABS 1 tablet (has no administration in time range)  enoxaparin (LOVENOX) injection 40 mg (has no administration in time range)  0.9 %  sodium chloride infusion (has no administration in time range)  acetaminophen (TYLENOL) tablet 650 mg (has no administration in time range)    Or  acetaminophen (TYLENOL) suppository 650 mg (has no administration in time range)  ondansetron (ZOFRAN) tablet 4 mg (has no administration in time range)    Or  ondansetron (ZOFRAN) injection 4 mg (has no administration in time range)  morphine 4 MG/ML injection 2 mg (2 mg Intravenous Given 04/20/17 1530)  piperacillin-tazobactam (ZOSYN) IVPB 3.375 g (has no administration in time range)  piperacillin-tazobactam (ZOSYN) IVPB 3.375 g (0 g Intravenous Stopped 04/20/17 1521)  sodium chloride 0.9 % bolus 1,000 mL (1,000 mLs Intravenous New Bag/Given  04/20/17 1423)  HYDROmorphone (DILAUDID) injection 0.5 mg (0.5 mg Intravenous  Given 04/20/17 1422)    BLAKLEIGH STRAW is 37 y.o. female presenting with severe left lower quadrant pain, constipation, abdominal bloating and nausea consistent with prior episodes of diverticulitis.  She had a perforation in the past which required a colostomy.  [facility], she is [redacted] weeks pregnant.  Dr. Nehemiah Settle per chart review discussed with radiology imaging strategies and it appears that MRI was not recommended secondary to the patient's obesity and history of colonic surgeries.  Her temperature is slightly elevated at 99 2, white count slightly elevated at 13 consistent with draw from several days ago.  Abdominal exam is concerning, will discuss with surgery.  Patient is requesting pain medication, she understands there is a risk to the fetus, and shared decision making we decided to proceed forward with IV narcotics, half a milligram of Dilaudid ordered  Unassigned admission to [facility]     Final Clinical Impressions(s) / ED Diagnoses   Final diagnoses:  Diverticulitis  [redacted] weeks gestation of pregnancy    ED Discharge Orders    None       Karen Kays Charna Elizabeth 04/20/17 1545    Malvin Johns, MD 04/20/17 1611

## 2017-04-20 NOTE — ED Notes (Signed)
Attempted IV x2 unsuccessfully 

## 2017-04-20 NOTE — H&P (Signed)
[facility] History and Physical  Tina Riggs FFM:[id number] DOB: 08-26-1980 DOA: 04/20/2017  PCP: Patient, No Pcp Per  Patient coming from: home  Chief Complaint: Abdominal pain, cramping  HPI: Tina Riggs is a 37 y.o. female with a medical history of diverticulitis, colon resection with revision of her colostomy, obesity, tobacco abuse, who is currently [redacted] weeks pregnant, presented to the emergency department with complaints of abdominal pain and distention with cramping.  Patient denies having any nausea or vomiting.  She states that this pain is reminiscent of abdominal pain that she had in 2016 when she was hospitalized for diverticulitis.  Patient states her pain is severe and is 10 out of 10.  She has had multiple visits to [facility] however declined admission at that time.  Patient was given Augmentin as well as pain medicine and discharged home.  Despite taking the antibiotics and pain medication, she continued to have severe pain.  Patient denies any recent illness or travel.  She also complains of constipation.  Patient has used a suppository with minimal results.  She denies any current chest pain, shortness of breath, dizziness, headache, vaginal discharge or bleeding, increased urinary frequency or painful urination.  ED Course: Patient was found to have a mildly elevated temperature at 99.2 F, slightly elevated white count at 11.4, repeated to be 12.7.  Patient was placed on IV Zosyn and pain control.  [facility] called for admission.  Review of Systems:  All other systems reviewed and are negative.   Past Medical History:  Diagnosis Date  . Diverticulitis    hospitalized 04/13/2014; hospitalized 04/29/2014  . HPV in female   . Vaginal Pap smear, abnormal     Past Surgical History:  Procedure Laterality Date  . CESAREAN SECTION  02/2006  . COLONOSCOPY N/A 06/11/2015   Procedure: COLONOSCOPY;  Surgeon: Leighton Ruff, MD;  Location[facility];  Service: General;   Laterality: N/A;  . COLOSTOMY N/A 05/18/2014   Procedure: COLOSTOMY;  Surgeon: Georganna Skeans, MD;  Location: [place];  Service: General;  Laterality: N/A;  . COLOSTOMY REVISION N/A 05/18/2014   Procedure: COLON RESECTION SIGMOID;  Surgeon: Georganna Skeans, MD;  Location: [place];  Service: General;  Laterality: N/A;  . COLPOSCOPY  Sep 2012  . LAPAROSCOPIC LYSIS OF ADHESIONS N/A 05/18/2014   Procedure: LAPAROSCOPIC MOBILIZATION OF SPLENIC FLEXURE;  Surgeon: Georganna Skeans, MD;  Location: [place];  Service: General;  Laterality: N/A;    Social History:  reports that she has been smoking cigarettes.  She has a 8.00 pack-year smoking history. She has never used smokeless tobacco. She reports that she does not drink alcohol or use drugs.   Allergies  Allergen Reactions  . Robaxin [Methocarbamol] Swelling and Other (See Comments)    Bottom lip swelled  . Penicillins Itching, Rash and Other (See Comments)    Has patient had a PCN reaction causing immediate rash, facial/tongue/throat swelling, SOB or lightheadedness with hypotension: yes Has patient had a PCN reaction causing severe rash involving mucus membranes or skin necrosis: no Has patient had a PCN reaction that required hospitalization no Has patient had a PCN reaction occurring within the last 10 years: no If all of the above answers are "NO", then may proceed with Cephalosporin use. Pt states she has tolerated Amoxicillin in the past     Family History  Problem Relation Age of Onset  . HIV Mother      Prior to Admission medications   Medication Sig Start Date  End Date Taking? Authorizing Provider  amoxicillin-clavulanate (AUGMENTIN) 875-125 MG tablet Take 1 tablet by mouth every 12 (twelve) hours for 10 days. 04/18/17 04/28/17 Yes Jorje Guild, NP  bisacodyl (DULCOLAX) 10 MG suppository Place 1 suppository (10 mg total) rectally as needed for moderate constipation. 04/19/17  Yes Lavonia Drafts, MD  fluconazole (DIFLUCAN) 150 MG  tablet Take 150 mg by mouth once.   Yes [provider]  nitrofurantoin, macrocrystal-monohydrate, (MACROBID) 100 MG capsule Take 100 mg by mouth 2 (two) times daily.   Yes [provider]  oxyCODONE-acetaminophen (PERCOCET/ROXICET) 5-325 MG tablet Take 2 tablets by mouth every 6 (six) hours as needed for moderate pain or severe pain.   Yes [provider]  phenazopyridine (PYRIDIUM) 100 MG tablet Take 100 mg by mouth 3 (three) times daily as needed for pain.   Yes [provider]  Prenatal Vit-Fe Fumarate-FA (PNV PRENATAL PLUS MULTIVITAMIN) 27-1 MG TABS TAKE 1 TABLET BY MOUTH DAILY. 03/01/17  Yes Donnamae Jude, MD    Physical Exam: Vitals:   04/20/17 1321 04/20/17 1330  BP: 131/79 131/82  Pulse: 92 92  Resp: 16   Temp:    SpO2: 98% 98%     General: Well developed, well nourished, NAD, appears stated age  HEENT: NCAT, PERRLA, EOMI, Anicteic Sclera, mucous membranes moist.   Neck: Supple, no JVD, no masses  Cardiovascular: S1 S2 auscultated, no rubs, murmurs or gallops. Regular rate and rhythm.  Respiratory: Clear to auscultation bilaterally with equal chest rise  Abdomen: Soft, obese, diffusely TTP, nondistended, + bowel sounds, surgical scar  Extremities: warm dry without cyanosis clubbing or edema  Neuro: AAOx3, cranial nerves grossly intact. Strength 5/5 in patient's upper and lower extremities bilaterally  Skin: Without rashes exudates or nodules  Psych: Normal affect and demeanor with intact judgement and insight  Labs on Admission: I have personally reviewed following labs and imaging studies CBC: Recent Labs  Lab 04/18/17 1322 04/20/17 0831 04/20/17 1417  WBC 11.3* 11.4* 12.7*  HGB 11.9* 11.8* 11.7*  HCT 35.7* 35.9* 35.0*  MCV 89.9 89.8 89.5  PLT 315 333 998   Basic Metabolic Panel: Recent Labs  Lab 04/18/17 1322 04/20/17 0831 04/20/17 1417  NA 134* 134*  --   K 3.9 3.7  --   CL 105 104  --   CO2 20* 19*  --     GLUCOSE 98 111*  --   BUN 9 <5*  --   CREATININE 0.56 0.63 0.59  CALCIUM 9.1 9.0  --    GFR: Estimated Creatinine Clearance: 142.2 mL/min (by C-G formula based on SCr of 0.59 mg/dL). Liver Function Tests: Recent Labs  Lab 04/18/17 1322 04/20/17 0831  AST 19 18  ALT 15 17  ALKPHOS 80 76  BILITOT 0.3 0.4  PROT 6.4* 6.6  ALBUMIN 3.0* 2.9*   Recent Labs  Lab 04/20/17 0831  LIPASE 17   No results for input(s): AMMONIA in the last 168 hours. Coagulation Profile: No results for input(s): INR, PROTIME in the last 168 hours. Cardiac Enzymes: No results for input(s): CKTOTAL, CKMB, CKMBINDEX, TROPONINI in the last 168 hours. BNP (last 3 results) No results for input(s): PROBNP in the last 8760 hours. HbA1C: No results for input(s): HGBA1C in the last 72 hours. CBG: No results for input(s): GLUCAP in the last 168 hours. Lipid Profile: No results for input(s): CHOL, HDL, LDLCALC, TRIG, CHOLHDL, LDLDIRECT in the last 72 hours. Thyroid Function Tests: No results for input(s): TSH, T4TOTAL, FREET4,  T3FREE, THYROIDAB in the last 72 hours. Anemia Panel: No results for input(s): VITAMINB12, FOLATE, FERRITIN, TIBC, IRON, RETICCTPCT in the last 72 hours. Urine analysis:    Component Value Date/Time   COLORURINE YELLOW 04/20/2017 0818   APPEARANCEUR HAZY (A) 04/20/2017 0818   LABSPEC 1.012 04/20/2017 0818   PHURINE 6.0 04/20/2017 0818   GLUCOSEU NEGATIVE 04/20/2017 0818   HGBUR MODERATE (A) 04/20/2017 0818   BILIRUBINUR NEGATIVE 04/20/2017 0818   KETONESUR NEGATIVE 04/20/2017 0818   PROTEINUR NEGATIVE 04/20/2017 0818   UROBILINOGEN 1.0 04/04/2017 1658   NITRITE NEGATIVE 04/20/2017 0818   LEUKOCYTESUR NEGATIVE 04/20/2017 0818   Sepsis Labs: @LABRCNTIP (procalcitonin:4,lacticidven:4) ) Recent Results (from the past 240 hour(s))  Culture, OB Urine     Status: None   Collection Time: 04/17/17 11:19 PM  Result Value Ref Range Status   Specimen Description   Final    URINE,  CLEAN CATCH Performed at [facility], [address], [place], [place] [address]    Special Requests   Final    NONE Performed at [facility], [address], [place], [place] [address]    Culture   Final    NO GROWTH NO GROUP B STREP (S.AGALACTIAE) ISOLATED Performed at [facility], [place] [address], [place], [place] [address]    Report Status 04/19/2017 FINAL  Final     Radiological Exams on Admission: No results found.  EKG: None  Assessment/Plan  Abdominal pain suspect diverticulitis  -Unable to obtain CT of the abdomen given that patient is [redacted] weeks pregnant -Discussed with OB/GYN, Dr. Kennon Rounds, recommended MRI of the abdomen -EDP also discussed with Dr. Nehemiah Settle (OB), who discussed imaging with radiology: MRI was not recommended secondary to patient's obesity and history of colonic surgeries -Surgery consulted and appreciated given the patient has a history of diverticulitis with perforation and colostomy back in 2016 as well as reversal -Patient was given oral antibiotics, Augmentin, however with no improvement in her symptoms -Will place patient on IV Zosyn as well as morphine for pain control -NPO for now, place on IVF  -IV zofran as needed for nausea  Pregnancy -currently [redacted] weeks pregnant -Discussed with Ob/Gyn- would order fetal doppler every few days  Tobacco abuse -Discussed cessation  DVT prophylaxis: Lovenox  Code Status: Full  Family Communication: Son at bedside.  Admission, patients condition and plan of care including tests being ordered have been discussed with the patient, who indicates understanding and agrees with the plan and Code Status.  Disposition Plan: Admitted. Likely home when stable and improved.  Consults called: General surgery, pending callback  Admission status: Inpatient (Patient has had abdominal pain which is been worsening over the past 2-3 days, failed outpatient oral antibiotics.  Will need IV antibiotics as well as  pain control.)  Time spent: 70 minutes  Skip Litke D.O. [facility] Pager [phone]  If 7PM-7AM, please contact night-coverage [email or web address] Password [email or web address] 04/20/2017, 3:21 PM

## 2017-04-20 NOTE — ED Triage Notes (Signed)
Pt. Stated, I went to [facility] hospital 2 days ago and they wanted to admit me but gave me antibiotics and said to come here. I have diverticulitis and Im [redacted] weeks pregnant.  Pt.does not have a primary care doctor.

## 2017-04-21 LAB — COMPREHENSIVE METABOLIC PANEL
ALT: 18 U/L (ref 14–54)
ANION GAP: 9 (ref 5–15)
AST: 16 U/L (ref 15–41)
Albumin: 2.6 g/dL — ABNORMAL LOW (ref 3.5–5.0)
Alkaline Phosphatase: 71 U/L (ref 38–126)
BUN: 7 mg/dL (ref 6–20)
CHLORIDE: 105 mmol/L (ref 101–111)
CO2: 21 mmol/L — AB (ref 22–32)
Calcium: 8.2 mg/dL — ABNORMAL LOW (ref 8.9–10.3)
Creatinine, Ser: 0.65 mg/dL (ref 0.44–1.00)
GFR calc non Af Amer: >60 mL/min (ref >60)
Glucose, Bld: 74 mg/dL (ref 65–99)
Potassium: 3.4 mmol/L — ABNORMAL LOW (ref 3.5–5.1)
SODIUM: 135 mmol/L (ref 135–145)
Total Bilirubin: 0.8 mg/dL (ref 0.3–1.2)
Total Protein: 6 g/dL — ABNORMAL LOW (ref 6.5–8.1)

## 2017-04-21 LAB — CBC
HCT: 32.3 % — ABNORMAL LOW (ref 36.0–46.0)
Hemoglobin: 10.5 g/dL — ABNORMAL LOW (ref 12.0–15.0)
MCH: 29.6 pg (ref 26.0–34.0)
MCHC: 32.5 g/dL (ref 30.0–36.0)
MCV: 91 fL (ref 78.0–100.0)
Platelets: 306 10*3/uL (ref 150–400)
RBC: 3.55 MIL/uL — AB (ref 3.87–5.11)
RDW: 13.7 % (ref 11.5–15.5)
WBC: 11.2 10*3/uL — ABNORMAL HIGH (ref 4.0–10.5)

## 2017-04-21 LAB — HIV ANTIBODY (ROUTINE TESTING W REFLEX): HIV SCREEN 4TH GENERATION: NONREACTIVE

## 2017-04-21 MED ORDER — POLYETHYLENE GLYCOL 3350 17 G PO PACK
17.0000 g | PACK | Freq: Every day | ORAL | Status: DC
  Administered 2017-04-21: 17 g via ORAL
  Filled 2017-04-21 (×3): qty 1

## 2017-04-21 NOTE — Progress Notes (Signed)
PROGRESS NOTE                                                                                                                                                                                                             Patient Demographics:    Tina Riggs, is a 37 y.o. female, DOB - 11-18-1980, NOB:[id number]  Admit date - 04/20/2017   Admitting Physician Cristal Ford, DO  Outpatient Primary MD for the patient is Patient, No Pcp Per  LOS - 1   Chief Complaint  Patient presents with  . Diverticulitis  . Routine Prenatal Visit  . Abdominal Pain  . Abdominal Cramping       Brief Narrative   37 y.o. 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     Subjective:    Hassie Mandt today has, No headache, No chest pain,eport some abdominal pain, no bowel movement, denies any fever or chills.   Assessment  & Plan :    Active Problems:   Abdominal pain  Abdominal pain - This is most likely due to acute diverticulitis, giving examples a previous presentation, patie with history of dverticulitis requiring resection, colostomy status post, general surgery input appreciated, white blood cell count trending down, less nausea and vomiting today, abdominal pain hased, continue with IV Zosyn, continue with clear liquid diet, will continue to monitor clinically.  [redacted] weeks pregnant - Continue with prenatal vitamins  Tobacco abuse - She was counseled    Code Status : Full  Family Communication  : none at bedside  Disposition Plan  : Home when stable  Consults  :  General Surgery  Procedures  : none  DVT Prophylaxis  :  Lovenox   Lab Results  Component Value Date   PLT 306  04/21/2017    Antibiotics  :    Anti-infectives (From admission, onward)   Start     Dose/Rate Route Frequency Ordered Stop   04/20/17 2200  piperacillin-tazobactam (ZOSYN) IVPB 3.375 g     3.375 g 12.5 mL/hr over 240 Minutes Intravenous Every 8 hours 04/20/17 1511     04/20/17 1330  piperacillin-tazobactam (ZOSYN) IVPB 3.375 g     3.375 g 100 mL/hr over 30 Minutes Intravenous  Once 04/20/17 1323 04/20/17 1521        Objective:   Vitals:   04/20/17 1600 04/20/17 1623 04/20/17 2123 04/21/17 0455  BP:  124/67 122/76 121/67  Pulse:  79 82 81  Resp:  18 19 18   Temp:  99.2 F (37.3 C) 99.2 F (37.3 C) 98.2 F (36.8 C)  TempSrc:  Oral Oral Oral  SpO2:  100% 98% 100%  Weight: (!) 138.5 kg (305 lb 5.4 oz) (!) 138.7 kg (305 lb 12.5 oz)    Height: 5\' 6"  (1.676 m) 5\' 6"  (1.676 m)      Wt Readings from Last 3 Encounters:  04/20/17 (!) 138.7 kg (305 lb 12.5 oz)  04/19/17 (!) 138.8 kg (305 lb 14.4 oz)  04/17/17 (!) 139.8 kg (308 lb 4 oz)     Intake/Output Summary (Last 24 hours) at 04/21/2017 1413 Last data filed at 04/21/2017 0507 Gross per 24 hour  Intake 50 ml  Output -  Net 50 ml     Physical Exam  Awake Alert, Oriented X 3, No new F.N deficits, Normal affect Supple Neck,No JVD, No cervical lymphadenopathy appriciated.  Symmetrical Chest wall movement, Good air movement bilaterally, CTAB RRR,No Gallops,Rubs or new Murmurs, No Parasternal Heave +ve B.Sounds, Abd Soft, left abdominal tenderness, No rebound - guarding or rigidity. No Cyanosis, Clubbing or edema, No new Rash or bruise      Data Review:    CBC Recent Labs  Lab 04/18/17 1322 04/20/17 0831 04/20/17 1417 04/21/17 0607  WBC 11.3* 11.4* 12.7* 11.2*  HGB 11.9* 11.8* 11.7* 10.5*  HCT 35.7* 35.9* 35.0* 32.3*  PLT 315 333 320 306  MCV 89.9 89.8 89.5 91.0  MCH 30.0 29.5 29.9 29.6  MCHC 33.3 32.9 33.4 32.5  RDW 13.9 13.9 14.0 13.7    Chemistries  Recent Labs  Lab 04/18/17 1322 04/20/17 0831  04/20/17 1417 04/21/17 0607  NA 134* 134*  --  135  K 3.9 3.7  --  3.4*  CL 105 104  --  105  CO2 20* 19*  --  21*  GLUCOSE 98 111*  --  74  BUN 9 <5*  --  7  CREATININE 0.56 0.63 0.59 0.65  CALCIUM 9.1 9.0  --  8.2*  AST 19 18  --  16  ALT 15 17  --  18  ALKPHOS 80 76  --  71  BILITOT 0.3 0.4  --  0.8   ------------------------------------------------------------------------------------------------------------------ No results for input(s): CHOL, HDL, LDLCALC, TRIG, CHOLHDL, LDLDIRECT in the last 72 hours.  Lab Results  Component Value Date   HGBA1C 5.7 (H) 04/04/2017   ------------------------------------------------------------------------------------------------------------------ No results for input(s): TSH, T4TOTAL, T3FREE, THYROIDAB in the last 72 hours.  Invalid input(s): FREET3 ------------------------------------------------------------------------------------------------------------------ No results for input(s): VITAMINB12, FOLATE, FERRITIN, TIBC, IRON, RETICCTPCT in the last 72 hours.  Coagulation profile No results for input(s): INR, PROTIME in the last 168 hours.  No results for input(s): DDIMER in the last 72 hours.  Cardiac Enzymes No results for input(s): CKMB, TROPONINI, MYOGLOBIN in the last 168 hours.  Invalid input(s): CK ------------------------------------------------------------------------------------------------------------------ No results found for: BNP  Inpatient Medications  Scheduled Meds: . enoxaparin (LOVENOX) injection  40 mg Subcutaneous Q24H  . polyethylene glycol  17 g Oral Daily  . prenatal vitamin w/FE, FA  1 tablet Oral Daily   Continuous Infusions: . sodium chloride 100 mL/hr at 04/21/17  0500  . piperacillin-tazobactam (ZOSYN)  IV Stopped (04/21/17 1056)   PRN Meds:.acetaminophen **OR** acetaminophen, HYDROmorphone (DILAUDID) injection, ondansetron **OR** ondansetron (ZOFRAN) IV  Micro Results Recent Results (from the  past 240 hour(s))  Culture, OB Urine     Status: None   Collection Time: 04/17/17 11:19 PM  Result Value Ref Range Status   Specimen Description   Final    URINE, CLEAN CATCH Performed at [facility], [address], [place], [place] [address]    Special Requests   Final    NONE Performed at [facility], [address], [place], [place] [address]    Culture   Final    NO GROWTH NO GROUP B STREP (S.AGALACTIAE) ISOLATED Performed at [facility], [place] [address], [place],  [address]    Report Status 04/19/2017 FINAL  Final    Radiology Reports US Renal  Result Date: 04/18/2017 CLINICAL DATA:  Initial evaluation for hematuria, left-sided abdominal pain. EXAM: RENAL / URINARY TRACT ULTRASOUND COMPLETE COMPARISON:  Prior CT from 01/12/2017. FINDINGS: Right Kidney: Length: 10.2 cm. Echogenicity within normal limits. No mass or hydronephrosis visualized. Left Kidney: Length: 12.2 cm. Echogenicity within normal limits. No mass or hydronephrosis visualized. Bladder: Appears normal for degree of bladder distention. Bilateral ureteral jets visualized. Examination mildly limited by body habitus. IMPRESSION: Normal renal ultrasound. No sonographic evidence for nephrolithiasis. No hydronephrosis. Electronically Signed   By: Jeannine Boga M.D.   On: 04/18/2017 06:13    Time Spent in minutes  25 min   Phillips Climes M.D on 04/21/2017 at 2:13 PM  Between 7am to 7pm - Pager - [phone]  After 7pm go to [email or web address] - password [facility]  [facility] -  Office  [phone]

## 2017-04-21 NOTE — Consult Note (Addendum)
Reason for Consult:abdominal pain Referring Physician: Dr. Phillips Climes  Tina Riggs is an 37 y.o. female.  HPI: This is a 37 year old obese female who is [redacted] weeks pregnant who presents with sharp left lower quadrant abdominal pain.  She described the pain is moderate to severe.  She is now passing flatus.  Her last normal bowel movement was several days ago.  She has a previous history of perforated diverticulitis status post Hartman's procedure.  In 2017 she had her colostomy reversed.  She has done well since then.  She has done well since the surgery.  Since admission, she reports that her pain has improved slightly.  Activity like walking increases the pain.  Past Medical History:  Diagnosis Date  . Diverticulitis    hospitalized 04/13/2014; hospitalized 04/29/2014  . HPV in female   . Vaginal Pap smear, abnormal     Past Surgical History:  Procedure Laterality Date  . CESAREAN SECTION  02/2006  . COLON SURGERY    . COLONOSCOPY N/A 06/11/2015   Procedure: COLONOSCOPY;  Surgeon: Leighton Ruff, MD;  Location: [facility];  Service: General;  Laterality: N/A;  . COLOSTOMY N/A 05/18/2014   Procedure: COLOSTOMY;  Surgeon: Georganna Skeans, MD;  Location: [facility];  Service: General;  Laterality: N/A;  . COLOSTOMY REVERSAL  06/2015   robotic assisted colostomy reversal with LOA/notes 06/16/2015  . COLOSTOMY REVISION N/A 05/18/2014   Procedure: COLON RESECTION SIGMOID;  Surgeon: Georganna Skeans, MD;  Location: [facility];  Service: General;  Laterality: N/A;  . COLPOSCOPY  Sep 2012  . LAPAROSCOPIC LYSIS OF ADHESIONS N/A 05/18/2014   Procedure: LAPAROSCOPIC MOBILIZATION OF SPLENIC FLEXURE;  Surgeon: Georganna Skeans, MD;  Location: [facility];  Service: General;  Laterality: N/A;    Family History  Problem Relation Age of Onset  . HIV Mother     Social History:  reports that she has been smoking cigarettes.  She has a 7.20 pack-year smoking history. She has never used smokeless tobacco. She reports that she  does not drink alcohol or use drugs.  Allergies:  Allergies  Allergen Reactions  . Robaxin [Methocarbamol] Swelling and Other (See Comments)    Bottom lip swelled  . Penicillins Itching, Rash and Other (See Comments)    Has patient had a PCN reaction causing immediate rash, facial/tongue/throat swelling, SOB or lightheadedness with hypotension: yes Has patient had a PCN reaction causing severe rash involving mucus membranes or skin necrosis: no Has patient had a PCN reaction that required hospitalization no Has patient had a PCN reaction occurring within the last 10 years: no If all of the above answers are "NO", then may proceed with Cephalosporin use. Pt states she has tolerated Amoxicillin in the past     Medications: I have reviewed the patient's current medications.  Results for orders placed or performed during the hospital encounter of 04/20/17 (from the past 48 hour(s))  Urinalysis, Routine w reflex microscopic     Status: Abnormal   Collection Time: 04/20/17  8:18 AM  Result Value Ref Range   Color, Urine YELLOW YELLOW   APPearance HAZY (A) CLEAR   Specific Gravity, Urine 1.012 1.005 - 1.030   pH 6.0 5.0 - 8.0   Glucose, UA NEGATIVE NEGATIVE mg/dL   Hgb urine dipstick MODERATE (A) NEGATIVE   Bilirubin Urine NEGATIVE NEGATIVE   Ketones, ur NEGATIVE NEGATIVE mg/dL   Protein, ur NEGATIVE NEGATIVE mg/dL   Nitrite NEGATIVE NEGATIVE   Leukocytes, UA NEGATIVE NEGATIVE   RBC / HPF  6-30 0 - 5 RBC/hpf   WBC, UA 0-5 0 - 5 WBC/hpf   Bacteria, UA RARE (A) NONE SEEN   Squamous Epithelial / LPF 6-30 (A) NONE SEEN   Mucus PRESENT     Comment: Performed at [facility], [place] [address], [place], [place] [address]  Lipase, blood     Status: None   Collection Time: 04/20/17  8:31 AM  Result Value Ref Range   Lipase 17 11 - 51 U/L    Comment: Performed at [facility] [address], [place], [place] [address]  Comprehensive metabolic panel     Status: Abnormal    Collection Time: 04/20/17  8:31 AM  Result Value Ref Range   Sodium 134 (L) 135 - 145 mmol/L   Potassium 3.7 3.5 - 5.1 mmol/L   Chloride 104 101 - 111 mmol/L   CO2 19 (L) 22 - 32 mmol/L   Glucose, Bld 111 (H) 65 - 99 mg/dL   BUN <5 (L) 6 - 20 mg/dL   Creatinine, Ser 0.63 0.44 - 1.00 mg/dL   Calcium 9.0 8.9 - 10.3 mg/dL   Total Protein 6.6 6.5 - 8.1 g/dL   Albumin 2.9 (L) 3.5 - 5.0 g/dL   AST 18 15 - 41 U/L   ALT 17 14 - 54 U/L   Alkaline Phosphatase 76 38 - 126 U/L   Total Bilirubin 0.4 0.3 - 1.2 mg/dL   GFR calc non Af Amer >60 >60 mL/min   GFR calc Af Amer >60 >60 mL/min    Comment: (NOTE) The eGFR has been calculated using the CKD EPI equation. This calculation has not been validated in all clinical situations. eGFR's persistently <60 mL/min signify possible Chronic Kidney Disease.    Anion gap 11 5 - 15    Comment: Performed at [facility] [address] [place], [place] [address]  CBC     Status: Abnormal   Collection Time: 04/20/17  8:31 AM  Result Value Ref Range   WBC 11.4 (H) 4.0 - 10.5 K/uL   RBC 4.00 3.87 - 5.11 MIL/uL   Hemoglobin 11.8 (L) 12.0 - 15.0 g/dL   HCT 35.9 (L) 36.0 - 46.0 %   MCV 89.8 78.0 - 100.0 fL   MCH 29.5 26.0 - 34.0 pg   MCHC 32.9 30.0 - 36.0 g/dL   RDW 13.9 11.5 - 15.5 %   Platelets 333 150 - 400 K/uL    Comment: Performed at [facility], [place] [address] [place], [place] [address]  HIV antibody (Routine Testing)     Status: None   Collection Time: 04/20/17  2:17 PM  Result Value Ref Range   HIV Screen 4th Generation wRfx Non Reactive Non Reactive    Comment: (NOTE) Performed At: [facility] [place], [place] [id number] Rush Farmer MD DZ:[id number] Performed at [facility], [place] [address] [place], [place] [address]   CBC     Status: Abnormal   Collection Time: 04/20/17  2:17 PM  Result Value Ref Range   WBC 12.7 (H) 4.0 - 10.5 K/uL   RBC 3.91 3.87 - 5.11 MIL/uL   Hemoglobin 11.7 (L)  12.0 - 15.0 g/dL   HCT 35.0 (L) 36.0 - 46.0 %   MCV 89.5 78.0 - 100.0 fL   MCH 29.9 26.0 - 34.0 pg   MCHC 33.4 30.0 - 36.0 g/dL   RDW 14.0 11.5 - 15.5 %   Platelets  320 150 - 400 K/uL    Comment: Performed at [facility], [place] [address], [place], [place] [address]  Creatinine, serum     Status: None   Collection Time: 04/20/17  2:17 PM  Result Value Ref Range   Creatinine, Ser 0.59 0.44 - 1.00 mg/dL   GFR calc non Af Amer >60 >60 mL/min   GFR calc Af Amer >60 >60 mL/min    Comment: (NOTE) The eGFR has been calculated using the CKD EPI equation. This calculation has not been validated in all clinical situations. eGFR's persistently <60 mL/min signify possible Chronic Kidney Disease. Performed at [facility], [place] [address] [place], [place] [address]   I-Stat beta hCG blood, ED     Status: Abnormal   Collection Time: 04/20/17  2:23 PM  Result Value Ref Range   I-stat hCG, quantitative >2,000.0 (H) <5 mIU/mL   Comment 3            Comment:   GEST. AGE      CONC.  (mIU/mL)   <=1 WEEK        5 - 50     2 WEEKS       50 - 500     3 WEEKS       100 - 10,000     4 WEEKS     1,000 - 30,000        FEMALE AND NON-PREGNANT FEMALE:     LESS THAN 5 mIU/mL   Comprehensive metabolic panel     Status: Abnormal   Collection Time: 04/21/17  6:07 AM  Result Value Ref Range   Sodium 135 135 - 145 mmol/L   Potassium 3.4 (L) 3.5 - 5.1 mmol/L   Chloride 105 101 - 111 mmol/L   CO2 21 (L) 22 - 32 mmol/L   Glucose, Bld 74 65 - 99 mg/dL   BUN 7 6 - 20 mg/dL   Creatinine, Ser 0.65 0.44 - 1.00 mg/dL   Calcium 8.2 (L) 8.9 - 10.3 mg/dL   Total Protein 6.0 (L) 6.5 - 8.1 g/dL   Albumin 2.6 (L) 3.5 - 5.0 g/dL   AST 16 15 - 41 U/L   ALT 18 14 - 54 U/L   Alkaline Phosphatase 71 38 - 126 U/L   Total Bilirubin 0.8 0.3 - 1.2 mg/dL   GFR calc non Af Amer >60 >60 mL/min   GFR calc Af Amer >60 >60 mL/min    Comment: (NOTE) The eGFR has been calculated using the CKD EPI equation. This  calculation has not been validated in all clinical situations. eGFR's persistently <60 mL/min signify possible Chronic Kidney Disease.    Anion gap 9 5 - 15    Comment: Performed at [facility] [address] [place], [place] [address]  CBC     Status: Abnormal   Collection Time: 04/21/17  6:07 AM  Result Value Ref Range   WBC 11.2 (H) 4.0 - 10.5 K/uL   RBC 3.55 (L) 3.87 - 5.11 MIL/uL   Hemoglobin 10.5 (L) 12.0 - 15.0 g/dL   HCT 32.3 (L) 36.0 - 46.0 %   MCV 91.0 78.0 - 100.0 fL   MCH 29.6 26.0 - 34.0 pg   MCHC 32.5 30.0 - 36.0 g/dL   RDW 13.7 11.5 - 15.5 %   Platelets 306 150 - 400 K/uL    Comment: Performed at [facility], [place] [address] [place],  [address]  No results found.  Review of Systems  Constitutional: Negative for chills and fever.  Gastrointestinal: Positive for abdominal pain. Negative for diarrhea, nausea and vomiting.  Genitourinary: Negative for dysuria.  All other systems reviewed and are negative.  Blood pressure 121/67, pulse 81, temperature 98.2 F (36.8 C), temperature source Oral, resp. rate 18, height '5\' 6"'$  (1.676 m), weight (!) 138.7 kg (305 lb 12.5 oz), last menstrual period 01/17/2017, SpO2 100 %. Physical Exam  Constitutional: She is oriented to person, place, and time. She appears well-developed and well-nourished. No distress.  HENT:  Head: Normocephalic and atraumatic.  Right Ear: External ear normal.  Left Ear: External ear normal.  Nose: Nose normal.  Mouth/Throat: Oropharynx is clear and moist.  Eyes: Pupils are equal, round, and reactive to light. Conjunctivae are normal. Right eye exhibits no discharge. Left eye exhibits no discharge. No scleral icterus.  Neck: Normal range of motion. No tracheal deviation present.  Cardiovascular: Normal rate, regular rhythm, normal heart sounds and intact distal pulses.  No murmur heard. Respiratory: Effort normal and breath sounds normal. No respiratory distress.  GI: Soft.  There is tenderness. There is guarding.  Her abdomen is morbidly obese.  She has a well-healed midline incision as well as left-sided ostomy site incision.  I believe there may be a chronically incarcerated hernia above the ostomy site which is nontender and I suspect contains omentum.  Her area of tenderness is in the left lower quadrant which is moderate to severe with guarding  Musculoskeletal: Normal range of motion. She exhibits no edema or tenderness.  Neurological: She is alert and oriented to person, place, and time.  Skin: Skin is warm. No rash noted. She is not diaphoretic. No erythema.  Psychiatric: Her behavior is normal. Judgment normal.    Assessment/Plan: Abdominal pain and tenderness of uncertain etiology  This certainly may be diverticulitis given the location although she has had part of her colon completely removed so recurrent diverticulitis could be rare.  Without some kind of imaging study, the diagnosis will remain uncertain.  Her white blood count is improving.  I am going to place her on MiraLAX.  If her white blood count starts to go back up she will need some type of imaging study.  We could try an ultrasound but a CT scan may eventually be necessary despite the pregnancy.  We will follow her closely with you.  I will allow her to have clear liquids  Kaira Stringfield A 04/21/2017, 12:11 PM

## 2017-04-21 NOTE — Progress Notes (Signed)
Patient Name: CURTISHA BENDIX, female   DOB: 1980/01/28, 37 y.o.  MRN: [id number]  Spoke with [facility] nurse, who was asked to doppler baby. At 13 wks, daily doppler not necessary. I did review chart - current medications safe in pregnancy. Agree with current management.  If there are any obstetrical concerns at any point, please don't hesitate to call: [phone].  Truett Mainland, DO 04/21/2017 9:19 AM

## 2017-04-22 LAB — CBC
HCT: 33 % — ABNORMAL LOW (ref 36.0–46.0)
Hemoglobin: 10.7 g/dL — ABNORMAL LOW (ref 12.0–15.0)
MCH: 29.2 pg (ref 26.0–34.0)
MCHC: 32.4 g/dL (ref 30.0–36.0)
MCV: 89.9 fL (ref 78.0–100.0)
Platelets: 321 10*3/uL (ref 150–400)
RBC: 3.67 MIL/uL — ABNORMAL LOW (ref 3.87–5.11)
RDW: 13.5 % (ref 11.5–15.5)
WBC: 8.4 10*3/uL (ref 4.0–10.5)

## 2017-04-22 MED ORDER — HYDROMORPHONE HCL 2 MG/ML IJ SOLN
1.0000 mg | Freq: Four times a day (QID) | INTRAMUSCULAR | Status: DC | PRN
  Administered 2017-04-22 – 2017-04-23 (×4): 1 mg via INTRAVENOUS
  Filled 2017-04-22 (×4): qty 1

## 2017-04-22 MED ORDER — OXYCODONE HCL 5 MG PO TABS
5.0000 mg | ORAL_TABLET | Freq: Four times a day (QID) | ORAL | Status: DC | PRN
  Administered 2017-04-22 – 2017-04-23 (×3): 5 mg via ORAL
  Filled 2017-04-22 (×3): qty 1

## 2017-04-22 MED ORDER — CLOTRIMAZOLE 1 % VA CREA
1.0000 | TOPICAL_CREAM | Freq: Every day | VAGINAL | Status: DC
  Administered 2017-04-22: 1 via VAGINAL
  Filled 2017-04-22 (×2): qty 45

## 2017-04-22 MED ORDER — OXYCODONE-ACETAMINOPHEN 5-325 MG PO TABS
1.0000 | ORAL_TABLET | Freq: Four times a day (QID) | ORAL | Status: DC | PRN
  Administered 2017-04-22: 1 via ORAL
  Filled 2017-04-22: qty 1

## 2017-04-22 MED ORDER — POTASSIUM CHLORIDE CRYS ER 20 MEQ PO TBCR
40.0000 meq | EXTENDED_RELEASE_TABLET | Freq: Once | ORAL | Status: AC
  Administered 2017-04-22: 40 meq via ORAL
  Filled 2017-04-22: qty 2

## 2017-04-22 MED ORDER — RISAQUAD PO CAPS
2.0000 | ORAL_CAPSULE | Freq: Every day | ORAL | Status: DC
  Administered 2017-04-22 – 2017-04-23 (×2): 2 via ORAL
  Filled 2017-04-22 (×2): qty 2

## 2017-04-22 NOTE — Progress Notes (Signed)
   Subjective/Chief Complaint: C/O vaginal yeast infection, crampy abdominal pain after clears   Objective: Vital signs in last 24 hours: Temp:  [98.4 F (36.9 C)-98.9 F (37.2 C)] 98.4 F (36.9 C) (04/21 0618) Pulse Rate:  [70-78] 73 (04/21 0618) Resp:  [26] 26 (04/21 0618) BP: (109-126)/(60-80) 126/80 (04/21 0618) SpO2:  [99 %-100 %] 100 % (04/21 0618) Last BM Date: 04/20/17  Intake/Output from previous day: 04/20 0701 - 04/21 0700 In: 1410 [P.O.:360; I.V.:600; IV Piggyback:450] Out: -  Intake/Output this shift: No intake/output data recorded.  General appearance: alert and cooperative Resp: clear to auscultation bilaterally Cardio: regular rate and rhythm GI: soft, mild LLQ tenderness  Lab Results:  Recent Labs    04/21/17 0607 04/22/17 0818  WBC 11.2* 8.4  HGB 10.5* 10.7*  HCT 32.3* 33.0*  PLT 306 321   BMET Recent Labs    04/20/17 0831 04/20/17 1417 04/21/17 0607  NA 134*  --  135  K 3.7  --  3.4*  CL 104  --  105  CO2 19*  --  21*  GLUCOSE 111*  --  74  BUN <5*  --  7  CREATININE 0.63 0.59 0.65  CALCIUM 9.0  --  8.2*   PT/INR No results for input(s): LABPROT, INR in the last 72 hours. ABG No results for input(s): PHART, HCO3 in the last 72 hours.  Invalid input(s): PCO2, PO2  Studies/Results: No results found.  Anti-infectives: Anti-infectives (From admission, onward)   Start     Dose/Rate Route Frequency Ordered Stop   04/20/17 2200  piperacillin-tazobactam (ZOSYN) IVPB 3.375 g     3.375 g 12.5 mL/hr over 240 Minutes Intravenous Every 8 hours 04/20/17 1511     04/20/17 1330  piperacillin-tazobactam (ZOSYN) IVPB 3.375 g     3.375 g 100 mL/hr over 30 Minutes Intravenous  Once 04/20/17 1323 04/20/17 1521      Assessment/Plan: Suspect diverticulitis - Zosyn, clears only today. Consider MR if not better tomorrow  Vaginal candidiasis - gyne-lotrimin  LOS: 2 days    Zenovia Jarred 04/22/2017

## 2017-04-22 NOTE — Progress Notes (Signed)
Tina Riggs states that she has thick white vaginal discharge and labial itching.  She requests treatment right now.  I explained to her that we would notify the physicians this morning.  She wanted someone to obtain fetal heart tones at 02:30 AM.  She became insistent that the machine was downstairs and someone needed to go get it.  She wanted to hear her baby's heart.  She wants to know why her physician from the outpatient clinic isn't here to see her.  She is upset that she is unable to have 2 mg of dilaudid every three hours and states that her pain is not going away with the pain medicine.  She is upset that I will not rapidly push the dilaudid through her IV followed by a saline flush.  She states that all the other nurses do it.  I explained to her multiple times that it was only necessary to follow the dilaudid injection with a saline flush when there are no IV fluids running and rapidly injecting the dilaudid could cause dizziness and nausea.  I administered the injections over 30-60 seconds and she stated that she "wanted to feel it going in".

## 2017-04-22 NOTE — Progress Notes (Signed)
PROGRESS NOTE                                                                                                                                                                                                             Patient Demographics:    Tina Riggs, is a 37 y.o. female, DOB - 06/09/1980, FOY:[id number]  Admit date - 04/20/2017   Admitting Physician Cristal Ford, DO  Outpatient Primary MD for the patient is Patient, No Pcp Per  LOS - 2   Chief Complaint  Patient presents with  . Diverticulitis  . Routine Prenatal Visit  . Abdominal Pain  . Abdominal Cramping       Brief Narrative   37 y.o. 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     Subjective:    Tina Riggs today has, No headache, No chest pain,Force her abdominal pain has improved, tolerating clear liquid diet.   Assessment  & Plan :    Active Problems:   Abdominal pain  Abdominal pain/suspect diverticulitis - This is most likely due to acute diverticulitis, given resemblance  of previous presentation, patient  with history of dverticulitis requiring resection, colostomy status post. -  general surgery input appreciated,history tolerating clear liquid diet white count is trending down, no fever, abdomen is less tender today, continue with IV Zosyn, continue with clear liquid diet but not surgery recommendation. - If no improvement of her symptoms may need MRI imaging,  - started on by mouth Percocet. Made with IV Dilaudid.  [redacted] weeks pregnant - Continue with prenatal vitamins,   Vaginal candidiasis - [place] with clotrimazole cream  Tobacco abuse - She was counseled    Code Status :  Full  Family Communication  : none at bedside  Disposition Plan  : Home when stable  Consults  :  General Surgery  Procedures  : none  DVT Prophylaxis  :  Lovenox   Lab Results  Component Value Date   PLT 321 04/22/2017    Antibiotics  :    Anti-infectives (From admission, onward)   Start     Dose/Rate Route Frequency  Ordered Stop   04/20/17 2200  piperacillin-tazobactam (ZOSYN) IVPB 3.375 g     3.375 g 12.5 mL/hr over 240 Minutes Intravenous Every 8 hours 04/20/17 1511     04/20/17 1330  piperacillin-tazobactam (ZOSYN) IVPB 3.375 g     3.375 g 100 mL/hr over 30 Minutes Intravenous  Once 04/20/17 1323 04/20/17 1521        Objective:   Vitals:   04/21/17 0455 04/21/17 1454 04/21/17 2223 04/22/17 0618  BP: 121/67 122/64 109/60 126/80  Pulse: 81 70 78 73  Resp: 18 (!) 26  (!) 26  Temp: 98.2 F (36.8 C) 98.9 F (37.2 C) 98.8 F (37.1 C) 98.4 F (36.9 C)  TempSrc: Oral Oral Oral Oral  SpO2: 100% 100% 99% 100%  Weight:      Height:        Wt Readings from Last 3 Encounters:  04/20/17 (!) 138.7 kg (305 lb 12.5 oz)  04/19/17 (!) 138.8 kg (305 lb 14.4 oz)  04/17/17 (!) 139.8 kg (308 lb 4 oz)     Intake/Output Summary (Last 24 hours) at 04/22/2017 1405 Last data filed at 04/22/2017 4315 Gross per 24 hour  Intake 1410 ml  Output -  Net 1410 ml     Physical Exam  Awake Alert, Oriented X 3, laying in bed in no apparent distress Supple Neck,No JVD, No cervical lymphadenopathy appriciated.  Symmetrical Chest wall movement, clear to auscultation, good air movement bilaterally RRR,No Gallops,Rubs or new Murmurs, No Parasternal Heave +ve B.Sounds, Abd Soft,no rebound, no guarding, her left side  Is less tender to palpation today. No Cyanosis, Clubbing or edema, No new Rash or bruise      Data Review:    CBC Recent Labs  Lab 04/18/17 1322 04/20/17 0831 04/20/17 1417 04/21/17 0607 04/22/17 0818  WBC 11.3* 11.4* 12.7* 11.2* 8.4  HGB 11.9* 11.8* 11.7*  10.5* 10.7*  HCT 35.7* 35.9* 35.0* 32.3* 33.0*  PLT 315 333 320 306 321  MCV 89.9 89.8 89.5 91.0 89.9  MCH 30.0 29.5 29.9 29.6 29.2  MCHC 33.3 32.9 33.4 32.5 32.4  RDW 13.9 13.9 14.0 13.7 13.5    Chemistries  Recent Labs  Lab 04/18/17 1322 04/20/17 0831 04/20/17 1417 04/21/17 0607  NA 134* 134*  --  135  K 3.9 3.7  --  3.4*  CL 105 104  --  105  CO2 20* 19*  --  21*  GLUCOSE 98 111*  --  74  BUN 9 <5*  --  7  CREATININE 0.56 0.63 0.59 0.65  CALCIUM 9.1 9.0  --  8.2*  AST 19 18  --  16  ALT 15 17  --  18  ALKPHOS 80 76  --  71  BILITOT 0.3 0.4  --  0.8   ------------------------------------------------------------------------------------------------------------------ No results for input(s): CHOL, HDL, LDLCALC, TRIG, CHOLHDL, LDLDIRECT in the last 72 hours.  Lab Results  Component Value Date   HGBA1C 5.7 (H) 04/04/2017   ------------------------------------------------------------------------------------------------------------------ No results for input(s): TSH, T4TOTAL, T3FREE, THYROIDAB in the last 72 hours.  Invalid input(s): FREET3 ------------------------------------------------------------------------------------------------------------------ No results for input(s): VITAMINB12, FOLATE, FERRITIN, TIBC, IRON, RETICCTPCT in the last 72 hours.  Coagulation profile No results for input(s): INR, PROTIME in the last 168 hours.  No results for input(s): DDIMER in the last 72 hours.  Cardiac Enzymes No results for input(s): CKMB, TROPONINI, MYOGLOBIN in the last 168 hours.  Invalid input(s): CK ------------------------------------------------------------------------------------------------------------------ No results found for: BNP  Inpatient Medications  Scheduled Meds: .  acidophilus  2 capsule Oral Daily  . clotrimazole  1 Applicatorful Vaginal QHS  . enoxaparin (LOVENOX) injection  40 mg Subcutaneous Q24H  . polyethylene glycol  17 g Oral Daily  .  prenatal vitamin w/FE, FA  1 tablet Oral Daily   Continuous Infusions: . sodium chloride 100 mL/hr at 04/21/17 0500  . piperacillin-tazobactam (ZOSYN)  IV 3.375 g (04/22/17 1340)   PRN Meds:.acetaminophen **OR** acetaminophen, HYDROmorphone (DILAUDID) injection, ondansetron **OR** ondansetron (ZOFRAN) IV, oxyCODONE-acetaminophen **AND** oxyCODONE  Micro Results Recent Results (from the past 240 hour(s))  Culture, OB Urine     Status: None   Collection Time: 04/17/17 11:19 PM  Result Value Ref Range Status   Specimen Description   Final    URINE, CLEAN CATCH Performed at [facility], [address], [place], [place] [address]    Special Requests   Final    NONE Performed at [facility], [address] [place], [place] [address]    Culture   Final    NO GROWTH NO GROUP B STREP (S.AGALACTIAE) ISOLATED Performed at [facility], [place] [address], [place], [place] [address]    Report Status 04/19/2017 FINAL  Final    Radiology Reports US Renal  Result Date: 04/18/2017 CLINICAL DATA:  Initial evaluation for hematuria, left-sided abdominal pain. EXAM: RENAL / URINARY TRACT ULTRASOUND COMPLETE COMPARISON:  Prior CT from 01/12/2017. FINDINGS: Right Kidney: Length: 10.2 cm. Echogenicity within normal limits. No mass or hydronephrosis visualized. Left Kidney: Length: 12.2 cm. Echogenicity within normal limits. No mass or hydronephrosis visualized. Bladder: Appears normal for degree of bladder distention. Bilateral ureteral jets visualized. Examination mildly limited by body habitus. IMPRESSION: Normal renal ultrasound. No sonographic evidence for nephrolithiasis. No hydronephrosis. Electronically Signed   By: Jeannine Boga M.D.   On: 04/18/2017 06:13    Time Spent in minutes  25 min   Phillips Climes M.D on 04/22/2017 at 2:05 PM  Between 7am to 7pm - Pager - [phone]  After 7pm go to [email or web address] - password [facility]  [facility] -  Office   [phone]

## 2017-04-23 LAB — CBC
HCT: 30.7 % — ABNORMAL LOW (ref 36.0–46.0)
HEMOGLOBIN: 10.1 g/dL — AB (ref 12.0–15.0)
MCH: 29.6 pg (ref 26.0–34.0)
MCHC: 32.9 g/dL (ref 30.0–36.0)
MCV: 90 fL (ref 78.0–100.0)
Platelets: 310 10*3/uL (ref 150–400)
RBC: 3.41 MIL/uL — ABNORMAL LOW (ref 3.87–5.11)
RDW: 13.9 % (ref 11.5–15.5)
WBC: 6.7 10*3/uL (ref 4.0–10.5)

## 2017-04-23 LAB — BASIC METABOLIC PANEL
Anion gap: 7 (ref 5–15)
BUN: <5 mg/dL — ABNORMAL LOW (ref 6–20)
CALCIUM: 8 mg/dL — AB (ref 8.9–10.3)
CHLORIDE: 105 mmol/L (ref 101–111)
CO2: 21 mmol/L — ABNORMAL LOW (ref 22–32)
CREATININE: 0.53 mg/dL (ref 0.44–1.00)
GFR calc non Af Amer: >60 mL/min (ref >60)
Glucose, Bld: 89 mg/dL (ref 65–99)
Potassium: 3.8 mmol/L (ref 3.5–5.1)
SODIUM: 133 mmol/L — AB (ref 135–145)

## 2017-04-23 MED ORDER — OXYCODONE-ACETAMINOPHEN 5-325 MG PO TABS: 2.0000 | ORAL_TABLET | Freq: Four times a day (QID) | ORAL | 0 refills | Status: DC | PRN

## 2017-04-23 MED ORDER — AMOXICILLIN-POT CLAVULANATE 875-125 MG PO TABS: 1.0000 | ORAL_TABLET | Freq: Two times a day (BID) | ORAL | 0 refills | Status: AC

## 2017-04-23 MED ORDER — CLOTRIMAZOLE 1 % VA CREA: 1.0000 | TOPICAL_CREAM | Freq: Every day | VAGINAL | 0 refills | Status: AC

## 2017-04-23 NOTE — Progress Notes (Addendum)
1245 Patient discharged to home. Verbalizes understanding of all discharge instructions including discharge medications and follow up MD visits. Patient waiting for family to arrive for transport.  1348 Patient discharged. Transported by family.

## 2017-04-23 NOTE — Discharge Instructions (Signed)
Follow with Primary MD in 7 days  ° °Get CBC, CMP, checked  by Primary MD next visit.  ° ° °Activity: As tolerated with Full fall precautions use walker/cane & assistance as needed ° ° °Disposition Home  ° ° °Diet: Heart Healthy , soft diet, with feeding assistance and aspiration precautions. ° °For Heart failure patients - Check your Weight same time everyday, if you gain over 2 pounds, or you develop in leg swelling, experience more shortness of breath or chest pain, call your Primary MD immediately. Follow Cardiac Low Salt Diet and 1.5 lit/day fluid restriction. ° ° °On your next visit with your primary care physician please Get Medicines reviewed and adjusted. ° ° °Please request your Prim.MD to go over all Hospital Tests and Procedure/Radiological results at the follow up, please get all Hospital records sent to your Prim MD by signing hospital release before you go home. ° ° °If you experience worsening of your admission symptoms, develop shortness of breath, life threatening emergency, suicidal or homicidal thoughts you must seek medical attention immediately by calling 911 or calling your MD immediately  if symptoms less severe. ° °You Must read complete instructions/literature along with all the possible adverse reactions/side effects for all the Medicines you take and that have been prescribed to you. Take any new Medicines after you have completely understood and accpet all the possible adverse reactions/side effects.  ° °Do not drive, operating heavy machinery, perform activities at heights, swimming or participation in water activities or provide baby sitting services if your were admitted for syncope or siezures until you have seen by Primary MD or a Neurologist and advised to do so again. ° °Do not drive when taking Pain medications.  ° ° °Do not take more than prescribed Pain, Sleep and Anxiety Medications ° °Special Instructions: If you have smoked or chewed Tobacco  in the last 2 yrs please stop  smoking, stop any regular Alcohol  and or any Recreational drug use. ° °Wear Seat belts while driving. ° ° °Please note ° °You were cared for by a hospitalist during your hospital stay. If you have any questions about your discharge medications or the care you received while you were in the hospital after you are discharged, you can call the unit and asked to speak with the hospitalist on call if the hospitalist that took care of you is not available. Once you are discharged, your primary care physician will handle any further medical issues. Please note that NO REFILLS for any discharge medications will be authorized once you are discharged, as it is imperative that you return to your primary care physician (or establish a relationship with a primary care physician if you do not have one) for your aftercare needs so that they can reassess your need for medications and monitor your lab values. ° °

## 2017-04-23 NOTE — Progress Notes (Signed)
   Subjective/Chief Complaint: She reports that she is feeling much better and that her pain has completely resolved   Objective: Vital signs in last 24 hours: Temp:  [98.2 F (36.8 C)-98.6 F (37 C)] 98.6 F (37 C) (04/22 0617) Pulse Rate:  [58-76] 58 (04/22 0617) Resp:  [18] 18 (04/22 0617) BP: (111-120)/(66-70) 116/68 (04/22 0617) SpO2:  [96 %-100 %] 96 % (04/22 0617) Last BM Date: 04/20/17  Intake/Output from previous day: No intake/output data recorded. Intake/Output this shift: No intake/output data recorded.  Exam: Today, her abdomen is non-tender, especially in the LLQ  Lab Results:  Recent Labs    04/22/17 0818 04/23/17 0635  WBC 8.4 6.7  HGB 10.7* 10.1*  HCT 33.0* 30.7*  PLT 321 310   BMET Recent Labs    04/21/17 0607 04/23/17 0635  NA 135 133*  K 3.4* 3.8  CL 105 105  CO2 21* 21*  GLUCOSE 74 89  BUN 7 <5*  CREATININE 0.65 0.53  CALCIUM 8.2* 8.0*   PT/INR No results for input(s): LABPROT, INR in the last 72 hours. ABG No results for input(s): PHART, HCO3 in the last 72 hours.  Invalid input(s): PCO2, PO2  Studies/Results: No results found.  Anti-infectives: Anti-infectives (From admission, onward)   Start     Dose/Rate Route Frequency Ordered Stop   04/20/17 2200  piperacillin-tazobactam (ZOSYN) IVPB 3.375 g     3.375 g 12.5 mL/hr over 240 Minutes Intravenous Every 8 hours 04/20/17 1511     04/20/17 1330  piperacillin-tazobactam (ZOSYN) IVPB 3.375 g     3.375 g 100 mL/hr over 30 Minutes Intravenous  Once 04/20/17 1323 04/20/17 1521      Assessment/Plan:  Abdominal pain of uncertain etiology.  With her total resolution of pain, this may have not be diverticular.  I now think she can be discharged today on oral Augmentin for a total of 7 more days. She should follow up in our office with Dr. Leighton Ruff in probably about 3 to 4 weeks.  LOS: 3 days    Perlita Forbush A 04/23/2017

## 2017-04-23 NOTE — Discharge Summary (Signed)
Tina Riggs, is a 37 y.o. female  DOB 12-16-1980  MRN [id number].  Admission date:  04/20/2017  Admitting Physician  Cristal Ford, DO  Discharge Date:  04/23/2017   Primary MD  Patient, No Pcp Per  Recommendations for primary care physician for things to follow:  - please check CBC, bmp During next visit.   Admission Diagnosis  Diverticulitis [K57.92] [redacted] weeks gestation of pregnancy [Z3A.13]   Discharge Diagnosis  Diverticulitis [K57.92] [redacted] weeks gestation of pregnancy [Z3A.13]    Active Problems:   Abdominal pain      Past Medical History:  Diagnosis Date  . Diverticulitis    hospitalized 04/13/2014; hospitalized 04/29/2014  . HPV in female   . Vaginal Pap smear, abnormal     Past Surgical History:  Procedure Laterality Date  . CESAREAN SECTION  02/2006  . COLON SURGERY    . COLONOSCOPY N/A 06/11/2015   Procedure: COLONOSCOPY;  Surgeon: Leighton Ruff, MD;  Location: [facility];  Service: General;  Laterality: N/A;  . COLOSTOMY N/A 05/18/2014   Procedure: COLOSTOMY;  Surgeon: Georganna Skeans, MD;  Location: [place];  Service: General;  Laterality: N/A;  . COLOSTOMY REVERSAL  06/2015   robotic assisted colostomy reversal with LOA/notes 06/16/2015  . COLOSTOMY REVISION N/A 05/18/2014   Procedure: COLON RESECTION SIGMOID;  Surgeon: Georganna Skeans, MD;  Location: [place];  Service: General;  Laterality: N/A;  . COLPOSCOPY  Sep 2012  . LAPAROSCOPIC LYSIS OF ADHESIONS N/A 05/18/2014   Procedure: LAPAROSCOPIC MOBILIZATION OF SPLENIC FLEXURE;  Surgeon: Georganna Skeans, MD;  Location: [place];  Service: General;  Laterality: N/A;       History of present illness and  Hospital Course:     Kindly see H&P for history of present illness and admission details, please review complete Labs, Consult reports and Test reports for all details in brief  HPI  from the history and physical done on the day of  admission  HPI: Tina Riggs is a 37 y.o. female with a medical history of diverticulitis, colon resection with revision of her colostomy, obesity, tobacco abuse, who is currently [redacted] weeks pregnant, presented to the emergency department with complaints of abdominal pain and distention with cramping.  Patient denies having any nausea or vomiting.  She states that this pain is reminiscent of abdominal pain that she had in 2016 when she was hospitalized for diverticulitis.  Patient states her pain is severe and is 10 out of 10.  She has had multiple visits to [facility] however declined admission at that time.  Patient was given Augmentin as well as pain medicine and discharged home.  Despite taking the antibiotics and pain medication, she continued to have severe pain.  Patient denies any recent illness or travel.  She also complains of constipation.  Patient has used a suppository with minimal results.  She denies any current chest pain, shortness of breath, dizziness, headache, vaginal discharge or bleeding, increased urinary frequency or painful urination.  ED Course: Patient was found to  have a mildly elevated temperature at 99.2 F, slightly elevated white count at 11.4, repeated to be 12.7.  Patient was placed on IV Zosyn and pain control.  [facility] called for admission.      Hospital Course  37 y.o.femalewith a medical history ofdiverticulitis,colon resection with revision of her colostomy, obesity, tobacco abuse, who is currently [redacted] weeks pregnant, presented to the emergency department with complaints of abdominal pain and distention with cramping, no imaging done giving her pregnancy, improving on IV Zosyn.    Abdominal pain/suspect diverticulitis - This is most likely due to acute diverticulitis, given resemblance  of previous presentation, patient  with history of dverticulitis requiring resection, colostomy status post reversal. -  general surgery input appreciated,she was  tolerating clear then full then soft diet, no fever, abdominal and totally resolved, no nausea, no vomiting, he was treated with IV Zosyn during hospital stay, she will be discharged on another 7 days of Augmentin as an outpatient. - Patient to follow with general surgery in 3-4 weeks - she given prescription for 20 tablets of Percocet(total of 10 doses only ), as she has recently received a prescription from her OB physician for another doses(both prescriptions should be enough for total of 5 days)  [redacted] weeks pregnant - Continue with prenatal vitamins, was instructed to follow with her OB by end of the week  Vaginal candidiasis - continue with clotrimazole cream  Tobacco abuse - She was counseled       Discharge Condition:  stable   Follow UP  Follow-up Information    Leighton Ruff, MD. Call.   Specialty:  General Surgery Why:  Call and schedule a follow appointment for 3-4 weeks from discharge Contact information: [address] [place] [address] [phone]             Discharge Instructions  and  Discharge Medications     Discharge Instructions    Discharge instructions   Complete by:  As directed    Follow with Primary MD  in 7 days   Get CBC, CMP,checked  by Primary MD next visit.    Activity: As tolerated with Full fall precautions use walker/cane & assistance as needed   Disposition Home    Diet: Heart Healthy , soft diet , with feeding assistance and aspiration precautions.  For Heart failure patients - Check your Weight same time everyday, if you gain over 2 pounds, or you develop in leg swelling, experience more shortness of breath or chest pain, call your Primary MD immediately. Follow Cardiac Low Salt Diet and 1.5 lit/day fluid restriction.   On your next visit with your primary care physician please Get Medicines reviewed and adjusted.   Please request your Prim.MD to go over all Hospital Tests and  Procedure/Radiological results at the follow up, please get all Hospital records sent to your Prim MD by signing hospital release before you go home.   If you experience worsening of your admission symptoms, develop shortness of breath, life threatening emergency, suicidal or homicidal thoughts you must seek medical attention immediately by calling 911 or calling your MD immediately  if symptoms less severe.  You Must read complete instructions/literature along with all the possible adverse reactions/side effects for all the Medicines you take and that have been prescribed to you. Take any new Medicines after you have completely understood and accpet all the possible adverse reactions/side effects.   Do not drive, operating heavy machinery, perform activities at heights, swimming or participation  in water activities or provide baby sitting services if your were admitted for syncope or siezures until you have seen by Primary MD or a Neurologist and advised to do so again.  Do not drive when taking Pain medications.    Do not take more than prescribed Pain, Sleep and Anxiety Medications  Special Instructions: If you have smoked or chewed Tobacco  in the last 2 yrs please stop smoking, stop any regular Alcohol  and or any Recreational drug use.  Wear Seat belts while driving.   Please note  You were cared for by a hospitalist during your hospital stay. If you have any questions about your discharge medications or the care you received while you were in the hospital after you are discharged, you can call the unit and asked to speak with the hospitalist on call if the hospitalist that took care of you is not available. Once you are discharged, your primary care physician will handle any further medical issues. Please note that NO REFILLS for any discharge medications will be authorized once you are discharged, as it is imperative that you return to your primary care physician (or establish a  relationship with a primary care physician if you do not have one) for your aftercare needs so that they can reassess your need for medications and monitor your lab values.   Increase activity slowly   Complete by:  As directed      Allergies as of 04/23/2017      Reactions   Robaxin [methocarbamol] Swelling, Other (See Comments)   Bottom lip swelled   Penicillins Itching, Rash, Other (See Comments)   Has patient had a PCN reaction causing immediate rash, facial/tongue/throat swelling, SOB or lightheadedness with hypotension: yes Has patient had a PCN reaction causing severe rash involving mucus membranes or skin necrosis: no Has patient had a PCN reaction that required hospitalization no Has patient had a PCN reaction occurring within the last 10 years: no If all of the above answers are "NO", then may proceed with Cephalosporin use. Pt states she has tolerated Amoxicillin in the past      Medication List    STOP taking these medications   fluconazole 150 MG tablet Commonly known as:  DIFLUCAN   nitrofurantoin (macrocrystal-monohydrate) 100 MG capsule Commonly known as:  MACROBID   phenazopyridine 100 MG tablet Commonly known as:  PYRIDIUM     TAKE these medications   amoxicillin-clavulanate 875-125 MG tablet Commonly known as:  AUGMENTIN Take 1 tablet by mouth every 12 (twelve) hours for 10 days.   bisacodyl 10 MG suppository Commonly known as:  DULCOLAX Place 1 suppository (10 mg total) rectally as needed for moderate constipation.   clotrimazole 1 % vaginal cream Commonly known as:  GYNE-LOTRIMIN Place 1 Applicatorful vaginally at bedtime for 4 days.   oxyCODONE-acetaminophen 5-325 MG tablet Commonly known as:  PERCOCET/ROXICET Take 2 tablets by mouth every 6 (six) hours as needed for moderate pain or severe pain.   PNV PRENATAL PLUS MULTIVITAMIN 27-1 MG Tabs TAKE 1 TABLET BY MOUTH DAILY.         Diet and Activity recommendation: See Discharge Instructions  above   Consults obtained - General surgery   Major procedures and Radiology Reports - PLEASE review detailed and final reports for all details, in brief -      US Renal  Result Date: 04/18/2017 CLINICAL DATA:  Initial evaluation for hematuria, left-sided abdominal pain. EXAM: RENAL / URINARY TRACT ULTRASOUND COMPLETE COMPARISON:  Prior  CT from 01/12/2017. FINDINGS: Right Kidney: Length: 10.2 cm. Echogenicity within normal limits. No mass or hydronephrosis visualized. Left Kidney: Length: 12.2 cm. Echogenicity within normal limits. No mass or hydronephrosis visualized. Bladder: Appears normal for degree of bladder distention. Bilateral ureteral jets visualized. Examination mildly limited by body habitus. IMPRESSION: Normal renal ultrasound. No sonographic evidence for nephrolithiasis. No hydronephrosis. Electronically Signed   By: Jeannine Boga M.D.   On: 04/18/2017 06:13    Micro Results     Recent Results (from the past 240 hour(s))  Culture, OB Urine     Status: None   Collection Time: 04/17/17 11:19 PM  Result Value Ref Range Status   Specimen Description   Final    URINE, CLEAN CATCH Performed at [facility], [address], [place], [place] [address]    Special Requests   Final    NONE Performed at [facility], [place], [place] [address]    Culture   Final    NO GROWTH NO GROUP B STREP (S.AGALACTIAE) ISOLATED Performed at [facility], [place] [address], [place], [place] [address]    Report Status 04/19/2017 FINAL  Final       Today   Subjective:   Ladasha Schnackenberg today has no headache,no chest or  abdominal pain, report had a good night sleep, tolerating  diet, and asked him to go home today.   Objective:   Blood pressure 116/68, pulse (!) 58, temperature 98.6 F (37 C), temperature source Oral, resp. rate 18, height 5\' 6"  (1.676 m), weight (!) 138.7 kg (305 lb 12.5 oz), last menstrual period 01/17/2017, SpO2 96  %.   Intake/Output Summary (Last 24 hours) at 04/23/2017 1153 Last data filed at 04/23/2017 1110 Gross per 24 hour  Intake 300 ml  Output -  Net 300 ml    Exam Awake Alert, Oriented x 3, No new F.N deficits, Normal affect Symmetrical Chest wall movement, Good air movement bilaterally, CTAB RRR,No Gallops,Rubs or new Murmurs, No Parasternal Heave +ve B.Sounds, Abd Soft, no abdominal tenderness today, left lower quadrant tenderness has totally resolved, No rebound -guarding or rigidity. No Cyanosis, Clubbing or edema, No new Rash or bruise  Data Review   CBC w Diff:  Lab Results  Component Value Date   WBC 6.7 04/23/2017   HGB 10.1 (L) 04/23/2017   HGB 11.4 04/04/2017   HCT 30.7 (L) 04/23/2017   HCT 35.7 04/04/2017   PLT 310 04/23/2017   PLT 359 04/04/2017   LYMPHOPCT 27 08/25/2015   MONOPCT 7 08/25/2015   EOSPCT 2 08/25/2015   BASOPCT 0 08/25/2015    CMP:  Lab Results  Component Value Date   NA 133 (L) 04/23/2017   K 3.8 04/23/2017   CL 105 04/23/2017   CO2 21 (L) 04/23/2017   BUN <5 (L) 04/23/2017   CREATININE 0.53 04/23/2017   PROT 6.0 (L) 04/21/2017   ALBUMIN 2.6 (L) 04/21/2017   BILITOT 0.8 04/21/2017   ALKPHOS 71 04/21/2017   AST 16 04/21/2017   ALT 18 04/21/2017  .   Total Time in preparing paper work, data evaluation and todays exam - 5 minutes  Phillips Climes M.D on 04/23/2017 at 11:53 AM  [facility]   Office  [phone]

## 2017-05-02 ENCOUNTER — Encounter: Payer: Self-pay | Admitting: Student

## 2017-05-02 DIAGNOSIS — Z98891 History of uterine scar from previous surgery: Secondary | ICD-10-CM | POA: Insufficient documentation

## 2017-05-03 ENCOUNTER — Encounter: Payer: Medicaid Other | Admitting: Student

## 2017-05-04 ENCOUNTER — Telehealth: Payer: Self-pay | Admitting: *Deleted

## 2017-05-04 NOTE — Telephone Encounter (Signed)
Tina Riggs called 05/02/17 2:15p and left a message but I could not understand what she said except for the word stomach and her phone number. There was a lot of background voices and noise.

## 2017-05-07 NOTE — Telephone Encounter (Signed)
I called Tina Riggs back and she states she doesn't have a need now ; that she has an appointment tomorrow

## 2017-05-08 ENCOUNTER — Ambulatory Visit (INDEPENDENT_AMBULATORY_CARE_PROVIDER_SITE_OTHER): Payer: Medicaid Other | Admitting: Nurse Practitioner

## 2017-05-08 VITALS — BP 112/56 | HR 92 | Wt 302.6 lb

## 2017-05-08 DIAGNOSIS — Z98891 History of uterine scar from previous surgery: Secondary | ICD-10-CM

## 2017-05-08 DIAGNOSIS — O09899 Supervision of other high risk pregnancies, unspecified trimester: Secondary | ICD-10-CM

## 2017-05-08 DIAGNOSIS — Z6841 Body Mass Index (BMI) 40.0 and over, adult: Secondary | ICD-10-CM

## 2017-05-08 DIAGNOSIS — K5790 Diverticulosis of intestine, part unspecified, without perforation or abscess without bleeding: Secondary | ICD-10-CM

## 2017-05-08 DIAGNOSIS — O3412 Maternal care for benign tumor of corpus uteri, second trimester: Secondary | ICD-10-CM

## 2017-05-08 DIAGNOSIS — O09529 Supervision of elderly multigravida, unspecified trimester: Secondary | ICD-10-CM

## 2017-05-08 DIAGNOSIS — R109 Unspecified abdominal pain: Secondary | ICD-10-CM

## 2017-05-08 DIAGNOSIS — D259 Leiomyoma of uterus, unspecified: Secondary | ICD-10-CM

## 2017-05-08 NOTE — Progress Notes (Signed)
Subjective:  Tina Riggs is a 37 y.o. G2P1001 at [redacted]w[redacted]d being seen today for ongoing prenatal care.  She is currently monitored for the following issues for this high-risk pregnancy and has Diverticulitis of colon with perforation; Tobacco use; Diverticular disease; Supervision of other high risk pregnancy, antepartum; Obesity affecting pregnancy, antepartum; Morbid obesity with BMI of 45.0-49.9, adult ([place]); Advanced maternal age in multigravida; Abdominal pain; and History of cesarean section on their problem list.  She was hospitalized for diverticulitis for 4 days and was discharged on April 22.    Patient reports periodic abdominal pain and cramping.  None today..  Contractions: Not present. Vag. Bleeding: None.  Movement: Absent. Denies leaking of fluid.   Her sister accompanies her today.  Client is distressed due to the cramping she has been feeling.  She states a previous doctor said she probably had an  umbilical hernia she wonders if this is causing the problem.  She is very distressed and her sister began talking loudly saying she needs an ultrasound to figure out what is going on.  Both became insistent that an ultrasound should be done.  Discussing possible causes of her abdominal pain such as scar tissue from previous surgery, constipation, diverticulitis, and her history of fibroids, did not allay any of her fears and she continues to say and ultrasound is indicated.  She has not had an ultrasound here during this pregnancy.  The following portions of the patient's history were reviewed and updated as appropriate: allergies, current medications, past family history, past medical history, past social history, past surgical history and problem list. Problem list updated.  Objective:   Vitals:   05/08/17 1501  BP: (!) 112/56  Pulse: 92  Weight: (!) 302 lb 9.6 oz (137.3 kg)    Fetal Status: Fetal Heart Rate (bpm): 155 Fundal Height: 32 cm Movement: Absent     General:  Alert,  oriented and cooperative. Patient is in no acute distress.  Skin: Skin is warm and dry. No rash noted.   Cardiovascular: Normal heart rate noted  Respiratory: Normal respiratory effort, no problems with respiration noted  Abdomen: Soft, gravid, No obvious umblilcal hernia seen.  There is firmness above the level of the umbilicus and due to the client's habitus, it is not possible to tell what is the fundus.   Pain/Pressure: Present     Pelvic:  Cervical exam deferred        Extremities: Normal range of motion.  Edema: Trace  Mental Status: Normal mood and affect. Normal behavior. Normal judgment and thought content.   Urinalysis:    not done   Assessment and Plan:  Pregnancy: G2P1001 at [redacted]w[redacted]d  measuring size larger than dates  Supervision of high risk pregnancy 1. 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 History of cesarean section  3. Abdominal pain, unspecified abdominal location Due to measuring size greater than dates, will schedule [place] prior to anatomy scan  4. Antepartum multigravida of advanced maternal age   48. Morbid obesity with BMI of 45.0-49.9, adult ([place])   6. Diverticular disease No pain or fever today.  Preterm labor symptoms and general obstetric precautions including but not limited to vaginal bleeding, contractions, leaking of fluid and fetal movement were reviewed in detail with the patient. Please refer to After Visit Summary for other counseling recommendations.  No follow-ups on file.  Earlie Server, RN, MSN, NP-BC Nurse  Practitioner, [facility] for [employer], [facility] [employer] 05/08/2017 4:00 PM

## 2017-05-09 ENCOUNTER — Encounter: Payer: Self-pay | Admitting: *Deleted

## 2017-05-14 ENCOUNTER — Ambulatory Visit (HOSPITAL_COMMUNITY)
Admission: RE | Admit: 2017-05-14 | Discharge: 2017-05-14 | Disposition: A | Discharge status: Home / Self Care | Payer: Medicaid Other | Source: Ambulatory Visit | Attending: Nurse Practitioner | Admitting: Nurse Practitioner

## 2017-05-14 ENCOUNTER — Other Ambulatory Visit: Payer: Self-pay | Admitting: Nurse Practitioner

## 2017-05-14 DIAGNOSIS — D259 Leiomyoma of uterus, unspecified: Secondary | ICD-10-CM

## 2017-05-14 DIAGNOSIS — O3412 Maternal care for benign tumor of corpus uteri, second trimester: Secondary | ICD-10-CM | POA: Diagnosis present

## 2017-05-14 DIAGNOSIS — Z98891 History of uterine scar from previous surgery: Secondary | ICD-10-CM

## 2017-05-14 DIAGNOSIS — O99332 Smoking (tobacco) complicating pregnancy, second trimester: Secondary | ICD-10-CM | POA: Diagnosis not present

## 2017-05-14 DIAGNOSIS — Z3A16 16 weeks gestation of pregnancy: Secondary | ICD-10-CM

## 2017-05-14 DIAGNOSIS — O99212 Obesity complicating pregnancy, second trimester: Secondary | ICD-10-CM | POA: Diagnosis not present

## 2017-05-14 DIAGNOSIS — O09522 Supervision of elderly multigravida, second trimester: Secondary | ICD-10-CM | POA: Diagnosis not present

## 2017-05-14 DIAGNOSIS — O34219 Maternal care for unspecified type scar from previous cesarean delivery: Secondary | ICD-10-CM | POA: Diagnosis not present

## 2017-05-15 ENCOUNTER — Encounter (HOSPITAL_COMMUNITY): Payer: Self-pay | Admitting: *Deleted

## 2017-05-25 ENCOUNTER — Encounter: Payer: Medicaid Other | Admitting: Obstetrics and Gynecology

## 2017-05-30 ENCOUNTER — Encounter (HOSPITAL_COMMUNITY): Payer: Self-pay

## 2017-05-30 ENCOUNTER — Ambulatory Visit (HOSPITAL_COMMUNITY)
Admission: RE | Admit: 2017-05-30 | Discharge: 2017-05-30 | Disposition: A | Discharge status: Home / Self Care | Payer: Medicaid Other | Source: Ambulatory Visit | Attending: Obstetrics and Gynecology | Admitting: Obstetrics and Gynecology

## 2017-05-30 ENCOUNTER — Other Ambulatory Visit: Payer: Self-pay | Admitting: Obstetrics and Gynecology

## 2017-05-30 DIAGNOSIS — D259 Leiomyoma of uterus, unspecified: Secondary | ICD-10-CM

## 2017-05-30 DIAGNOSIS — O09522 Supervision of elderly multigravida, second trimester: Secondary | ICD-10-CM | POA: Insufficient documentation

## 2017-05-30 DIAGNOSIS — Z3A19 19 weeks gestation of pregnancy: Secondary | ICD-10-CM | POA: Diagnosis not present

## 2017-05-30 DIAGNOSIS — Z720 Tobacco use: Secondary | ICD-10-CM

## 2017-05-30 DIAGNOSIS — Z98891 History of uterine scar from previous surgery: Secondary | ICD-10-CM

## 2017-05-30 DIAGNOSIS — Z3490 Encounter for supervision of normal pregnancy, unspecified, unspecified trimester: Secondary | ICD-10-CM

## 2017-05-30 DIAGNOSIS — O99210 Obesity complicating pregnancy, unspecified trimester: Secondary | ICD-10-CM | POA: Insufficient documentation

## 2017-05-30 DIAGNOSIS — O3412 Maternal care for benign tumor of corpus uteri, second trimester: Secondary | ICD-10-CM | POA: Diagnosis not present

## 2017-05-30 DIAGNOSIS — O99332 Smoking (tobacco) complicating pregnancy, second trimester: Secondary | ICD-10-CM | POA: Diagnosis not present

## 2017-06-04 ENCOUNTER — Encounter: Payer: Medicaid Other | Admitting: Obstetrics and Gynecology

## 2017-07-20 ENCOUNTER — Encounter: Payer: Medicaid Other | Admitting: Obstetrics and Gynecology

## 2017-08-01 ENCOUNTER — Other Ambulatory Visit: Payer: Self-pay | Admitting: *Deleted

## 2017-08-01 DIAGNOSIS — O09899 Supervision of other high risk pregnancies, unspecified trimester: Secondary | ICD-10-CM

## 2017-08-03 ENCOUNTER — Ambulatory Visit (INDEPENDENT_AMBULATORY_CARE_PROVIDER_SITE_OTHER): Payer: Medicaid Other | Admitting: Obstetrics and Gynecology

## 2017-08-03 ENCOUNTER — Other Ambulatory Visit: Payer: Medicaid Other

## 2017-08-03 ENCOUNTER — Encounter: Payer: Self-pay | Admitting: Obstetrics and Gynecology

## 2017-08-03 VITALS — BP 118/57 | HR 79 | Wt 303.0 lb

## 2017-08-03 DIAGNOSIS — O09899 Supervision of other high risk pregnancies, unspecified trimester: Secondary | ICD-10-CM

## 2017-08-03 DIAGNOSIS — O99210 Obesity complicating pregnancy, unspecified trimester: Secondary | ICD-10-CM

## 2017-08-03 DIAGNOSIS — Z98891 History of uterine scar from previous surgery: Secondary | ICD-10-CM

## 2017-08-03 DIAGNOSIS — O09523 Supervision of elderly multigravida, third trimester: Secondary | ICD-10-CM | POA: Diagnosis not present

## 2017-08-03 DIAGNOSIS — O09893 Supervision of other high risk pregnancies, third trimester: Secondary | ICD-10-CM | POA: Diagnosis present

## 2017-08-03 DIAGNOSIS — Z23 Encounter for immunization: Secondary | ICD-10-CM | POA: Diagnosis not present

## 2017-08-03 LAB — POCT URINALYSIS DIP (DEVICE)
Bilirubin Urine: NEGATIVE
Glucose, UA: NEGATIVE mg/dL
Ketones, ur: NEGATIVE mg/dL
LEUKOCYTES UA: NEGATIVE
NITRITE: NEGATIVE
PH: 6.5 (ref 5.0–8.0)
PROTEIN: 30 mg/dL — AB
Specific Gravity, Urine: 1.02 (ref 1.005–1.030)
UROBILINOGEN UA: 1 mg/dL (ref 0.0–1.0)

## 2017-08-03 NOTE — Progress Notes (Signed)
   PRENATAL VISIT NOTE  Subjective:  Tina Riggs is a 37 y.o. G2P1001 at [redacted]w[redacted]d being seen today for ongoing prenatal care.  She is currently monitored for the following issues for this high-risk pregnancy and has Diverticulitis of colon with perforation; Tobacco use; Diverticular disease; Supervision of other high risk pregnancy, antepartum; Obesity affecting pregnancy, antepartum; Morbid obesity with BMI of 45.0-49.9, adult ([place]); Advanced maternal age in multigravida; Abdominal pain; and History of cesarean section on their problem list.  Patient reports no complaints.  Contractions: Not present. Vag. Bleeding: None.  Movement: Present. Denies leaking of fluid.   The following portions of the patient's history were reviewed and updated as appropriate: allergies, current medications, past family history, past medical history, past social history, past surgical history and problem list. Problem list updated.  Objective:   Vitals:   08/03/17 0840  BP: (!) 118/57  Pulse: 79  Weight: (!) 303 lb (137.4 kg)    Fetal Status: Fetal Heart Rate (bpm): 150 Fundal Height: 29 cm Movement: Present     General:  Alert, oriented and cooperative. Patient is in no acute distress.  Skin: Skin is warm and dry. No rash noted.   Cardiovascular: Normal heart rate noted  Respiratory: Normal respiratory effort, no problems with respiration noted  Abdomen: Soft, gravid, appropriate for gestational age.  Pain/Pressure: Absent     Pelvic: Cervical exam deferred        Extremities: Normal range of motion.  Edema: Trace  Mental Status: Normal mood and affect. Normal behavior. Normal judgment and thought content.   Assessment and Plan:  Pregnancy: G2P1001 at [redacted]w[redacted]d  1. Supervision of other high risk pregnancy, antepartum Patient is doing well without complaints Third trimester labs today Follow up anatomy ultrasound ordered - Tdap vaccine greater than or equal to 7yo IM - [place] MFM OB FOLLOW UP; Future -  Glucose Tolerance, 2 Hours w/1 Hour - CBC - HIV antibody - RPR  2. Multigravida of advanced maternal age in third trimester  - [place] MFM OB FOLLOW UP; Future  3. History of cesarean section Patient undecided on TOLAV vs RCS Undecided on contraception - [place] MFM OB FOLLOW UP; Future  4. Obesity affecting pregnancy, antepartum  - [place] MFM OB FOLLOW UP; Future  Preterm labor symptoms and general obstetric precautions including but not limited to vaginal bleeding, contractions, leaking of fluid and fetal movement were reviewed in detail with the patient. Please refer to After Visit Summary for other counseling recommendations.  Return in about 2 weeks (around 08/17/2017) for [place].  Future Appointments  Date Time Provider [place]  08/14/2017  2:45 PM [place] 2 [id number]-MFCUS MFC-US    Mora Bellman, MD

## 2017-08-03 NOTE — Patient Instructions (Addendum)
Vaginal Birth After Cesarean Delivery Vaginal birth after cesarean delivery (VBAC) is giving birth vaginally after previously delivering a baby by a cesarean. In the past, if a woman had a cesarean delivery, all births afterward would be done by cesarean delivery. This is no longer true. It can be safe for the mother to try a vaginal delivery after having a cesarean delivery. It is important to discuss VBAC with your health care provider early in the pregnancy so you can understand the risks, benefits, and options. It will give you time to decide what is best in your particular case. The final decision about whether to have a VBAC or repeat cesarean delivery should be between you and your health care provider. Any changes in your health or your baby's health during your pregnancy may make it necessary to change your initial decision about VBAC. Women who plan to have a VBAC should check with their health care provider to be sure that:  The previous cesarean delivery was done with a low transverse uterine cut (incision) (not a vertical classical incision).  The birth canal is big enough for the baby.  There were no other operations on the uterus.  An electronic fetal monitor (EFM) will be on at all times during labor.  An operating room will be available and ready in case an emergency cesarean delivery is needed.  A health care provider and surgical nursing staff will be available at all times during labor to be ready to do an emergency delivery cesarean if necessary.  An anesthesiologist will be present in case an emergency cesarean delivery is needed.  The nursery is prepared and has adequate personnel and necessary equipment available to care for the baby in case of an emergency cesarean delivery. Benefits of VBAC  Shorter stay in the hospital.  Avoidance of risks associated with cesarean delivery, such as: ? Surgical complications, such as opening of the incision or hernia in the  incision. ? Injury to other organs. ? Fever. This can occur if an infection develops after surgery. It can also occur as a reaction to the medicine given to make you numb during the surgery.  Less blood loss and need for blood transfusions.  Lower risk of blood clots and infection.  Shorter recovery.  Decreased risk for having to remove the uterus (hysterectomy).  Decreased risk for the placenta to completely or partially cover the opening of the uterus (placenta previa) with a future pregnancy.  Decrease risk in future labor and delivery. Risks of a VBAC  Tearing (rupture) of the uterus. This is occurs in less than 1% of VBACs. The risk of this happening is higher if: ? Steps are taken to begin the labor process (induce labor) or stimulate or strengthen contractions (augment labor). ? Medicine is used to soften (ripen) the cervix.  Having to remove the uterus (hysterectomy) if it ruptures. VBAC should not be done if:  The previous cesarean delivery was done with a vertical (classical) or T-shaped incision or you do not know what kind of incision was made.  You had a ruptured uterus.  You have had certain types of surgery on your uterus, such as removal of uterine fibroids. Ask your health care provider about other types of surgeries that prevent you from having a VBAC.  You have certain medical or childbirth (obstetrical) problems.  There are problems with the baby.  You have had two previous cesarean deliveries and no vaginal deliveries. Other facts to know about VBAC:  It   is safe to have an epidural anesthetic with VBAC.  It is safe to turn the baby from a breech position (attempt an external cephalic version).  It is safe to try a VBAC with twins.  VBAC may not be successful if your baby weights 8.8 lb (4 kg) or more. However, weight predictions are not always accurate and should not be used alone to decide if VBAC is right for you.  There is an increased failure rate  if the time between the cesarean delivery and VBAC is less than 19 months.  Your health care provider may advise against a VBAC if you have preeclampsia (high blood pressure, protein in the urine, and swelling of face and extremities).  VBAC is often successful if you previously gave birth vaginally.  VBAC is often successful when the labor starts spontaneously before the due date.  Delivering a baby through a VBAC is similar to having a normal spontaneous vaginal delivery. This information is not intended to replace advice given to you by your health care provider. Make sure you discuss any questions you have with your health care provider. Document Released: 06/11/2006 Document Revised: 05/27/2015 Document Reviewed: 07/18/2012 Elsevier Interactive Patient Education  2018 [place] [address] [place], [place]  [address] Phone - [phone]   Fax - [phone]  [facility] [address] [place], [place] [address] Phone - [phone]   Fax - [place] [address] [place], [place]  [address] Phone - [phone]   Fax - [phone]  [place] [address] [place] [address] [place], [place]  [address] Phone - [phone]   Fax - [phone]  [place] [address] [place], [place]  [address] Phone - [phone]   Fax - [phone]  [facility] [address] [place], [place]  [address] Phone - [phone]   Fax - [place] [address] [place], [place]  [address] Phone - [phone]   Fax - [phone]  [place] [address] [place], [place] [address] [place] [place], [place]  [address] Phone - [phone]   Fax - [place] [address] [place], [place]   [address] Phone - [phone]   Fax - [phone] [facility] [place]. [address] [place], [place]  [address] Phone - [phone]   Fax - [phone]  [place] [address] [place], [place]  [address] Phone - [phone]   Fax - [phone]  [facility] [place], [place], [place]  [address] Phone - [phone]   Fax - [place] [address], [place], [place]  [address] Phone - [phone]   Fax - [phone]  [facility] [address], [place], [place]  [address] Phone - [place], [place]  [address] Phone - [phone]   Fax - [place] [address] [place], [place]  [address] Phone - [phone]   Fax - [phone]  [place] [address] [place], [place]  [address] Phone - [phone]   Fax - [place]. [address] [place], [place]  [address] Phone - [phone]  Fax - [phone]   [address] [place], [place], [place]  [address] Phone - [phone]   Fax - [place] [address], [place], [place]  [address] Phone - [phone]   Fax - [phone]  [place] [address], [place], [place]  [address] Phone - [phone]   Fax - [place] [address], [place], [place]  [address] Phone - [phone]   Fax - [phone]  [place] [address] [place], [place]  [address] Phone - [phone]   Fax - [phone] [place] [address] [place], [place]  [address] Phone - [phone]   Fax - [place] [address] [place], [place]  [address] Phone -  [phone]   Fax - [place] [address] [place], [place]  [address] Phone - [phone]   Fax - [phone]  [place] [address] [place] [address] Phone [phone]  Fax [phone]  Places to have your son circumcised:    [facility] [phone] while you are in hospital  [facility] [phone] $244 by 4 wks  [facility] [phone] $175 by 2 wks  [facility] [phone] $250 by 7 days [facility] [phone] $269 by 4 wks  These prices sometimes change but are roughly what you can expect to pay. Please call and confirm pricing.   Circumcision is considered an elective/non-medically necessary procedure. There are many reasons parents decide to have their sons circumsized. During the first year of life circumcised males have a reduced risk of urinary tract infections but after this year the rates between circumcised males and uncircumcised males are the same.  It is safe to have your son circumcised outside of the hospital and the places above perform them regularly.   Deciding about Circumcision in Baby Boys  (Up-to-date The Basics)  What is circumcision?  Circumcision is a surgery that removes the skin that covers the tip of the penis, called the "foreskin" Circumcision is usually done when a boy is between 90 and 34 days old. In the [place], circumcision is common. In some other countries, fewer boys are circumcised. Circumcision is a common tradition in some religions.  Should I have my baby boy circumcised?  There is no easy answer. Circumcision has some benefits. But it also has risks. After talking with your doctor, you will have to decide for yourself what is right for your  family.  What are the benefits of circumcision?  Circumcised boys seem to have slightly lower rates of: ?Urinary tract infections ?Swelling of the opening at the tip of the penis Circumcised men seem to have slightly lower rates of: ?Urinary tract infections ?Swelling of the opening at the tip of the penis ?Penis cancer ?HIV and other infections that you catch during sex ?Cervical cancer in the women they have sex with Even so, in the [place], the risks of these problems are small - even in boys and men who have not been circumcised. Plus, boys and men who are not circumcised can reduce these extra risks by: ?Cleaning their penis well ?Using condoms during sex  What are the risks of circumcision?  Risks include: ?Bleeding or infection from the surgery ?Damage to or amputation of the penis ?A chance that the doctor will cut off too much or not  enough of the foreskin ?A chance that sex won't feel as good later in life Only about 1 out of every 200 circumcisions leads to problems. There is also a chance that your health insurance won't pay for circumcision.  How is circumcision done in baby boys?  First, the baby gets medicine for pain relief. This might be a cream on the skin or a shot into the base of the penis. Next, the doctor cleans the baby's penis well. Then he or she uses special tools to cut off the foreskin. Finally, the doctor wraps a bandage (called gauze) around the baby's penis. If you have your baby circumcised, his doctor or nurse will give you instructions on how to care for him after the surgery. It is important that you follow those instructions carefully.

## 2017-08-04 LAB — CBC
Hematocrit: 29.3 % — ABNORMAL LOW (ref 34.0–46.6)
Hemoglobin: 9.5 g/dL — ABNORMAL LOW (ref 11.1–15.9)
MCH: 29.1 pg (ref 26.6–33.0)
MCHC: 32.4 g/dL (ref 31.5–35.7)
MCV: 90 fL (ref 79–97)
PLATELETS: 370 10*3/uL (ref 150–450)
RBC: 3.27 x10E6/uL — ABNORMAL LOW (ref 3.77–5.28)
RDW: 13.8 % (ref 12.3–15.4)
WBC: 10.7 10*3/uL (ref 3.4–10.8)

## 2017-08-04 LAB — GLUCOSE TOLERANCE, 2 HOURS W/ 1HR
GLUCOSE, FASTING: 83 mg/dL (ref 65–91)
Glucose, 1 hour: 159 mg/dL (ref 65–179)
Glucose, 2 hour: 86 mg/dL (ref 65–152)

## 2017-08-04 LAB — HIV ANTIBODY (ROUTINE TESTING W REFLEX): HIV Screen 4th Generation wRfx: NONREACTIVE

## 2017-08-04 LAB — RPR: RPR Ser Ql: NONREACTIVE

## 2017-08-06 ENCOUNTER — Inpatient Hospital Stay (HOSPITAL_COMMUNITY)
Admission: AD | Admit: 2017-08-06 | Discharge: 2017-08-07 | Disposition: A | Discharge status: Home / Self Care | Payer: Medicaid Other | Source: Ambulatory Visit | Attending: Obstetrics and Gynecology | Admitting: Obstetrics and Gynecology

## 2017-08-06 DIAGNOSIS — Z8719 Personal history of other diseases of the digestive system: Secondary | ICD-10-CM | POA: Diagnosis not present

## 2017-08-06 DIAGNOSIS — O99210 Obesity complicating pregnancy, unspecified trimester: Secondary | ICD-10-CM

## 2017-08-06 DIAGNOSIS — Z888 Allergy status to other drugs, medicaments and biological substances status: Secondary | ICD-10-CM | POA: Diagnosis not present

## 2017-08-06 DIAGNOSIS — O26893 Other specified pregnancy related conditions, third trimester: Secondary | ICD-10-CM

## 2017-08-06 DIAGNOSIS — O26899 Other specified pregnancy related conditions, unspecified trimester: Secondary | ICD-10-CM

## 2017-08-06 DIAGNOSIS — Z98891 History of uterine scar from previous surgery: Secondary | ICD-10-CM

## 2017-08-06 DIAGNOSIS — F17210 Nicotine dependence, cigarettes, uncomplicated: Secondary | ICD-10-CM | POA: Insufficient documentation

## 2017-08-06 DIAGNOSIS — Z3A28 28 weeks gestation of pregnancy: Secondary | ICD-10-CM

## 2017-08-06 DIAGNOSIS — O99213 Obesity complicating pregnancy, third trimester: Secondary | ICD-10-CM | POA: Diagnosis not present

## 2017-08-06 DIAGNOSIS — E669 Obesity, unspecified: Secondary | ICD-10-CM | POA: Insufficient documentation

## 2017-08-06 DIAGNOSIS — O09523 Supervision of elderly multigravida, third trimester: Secondary | ICD-10-CM | POA: Diagnosis not present

## 2017-08-06 DIAGNOSIS — Z880 Allergy status to penicillin: Secondary | ICD-10-CM | POA: Diagnosis not present

## 2017-08-06 DIAGNOSIS — O99333 Smoking (tobacco) complicating pregnancy, third trimester: Secondary | ICD-10-CM | POA: Insufficient documentation

## 2017-08-06 DIAGNOSIS — R12 Heartburn: Secondary | ICD-10-CM | POA: Diagnosis not present

## 2017-08-06 DIAGNOSIS — R1013 Epigastric pain: Secondary | ICD-10-CM | POA: Diagnosis not present

## 2017-08-06 DIAGNOSIS — O09899 Supervision of other high risk pregnancies, unspecified trimester: Secondary | ICD-10-CM

## 2017-08-06 DIAGNOSIS — R1010 Upper abdominal pain, unspecified: Secondary | ICD-10-CM | POA: Diagnosis present

## 2017-08-06 DIAGNOSIS — Z6841 Body Mass Index (BMI) 40.0 and over, adult: Secondary | ICD-10-CM

## 2017-08-07 ENCOUNTER — Other Ambulatory Visit: Payer: Self-pay

## 2017-08-07 ENCOUNTER — Encounter (HOSPITAL_COMMUNITY): Payer: Self-pay

## 2017-08-07 DIAGNOSIS — O26893 Other specified pregnancy related conditions, third trimester: Secondary | ICD-10-CM

## 2017-08-07 DIAGNOSIS — R1013 Epigastric pain: Secondary | ICD-10-CM

## 2017-08-07 DIAGNOSIS — Z3A28 28 weeks gestation of pregnancy: Secondary | ICD-10-CM

## 2017-08-07 DIAGNOSIS — R12 Heartburn: Secondary | ICD-10-CM

## 2017-08-07 LAB — URINALYSIS, ROUTINE W REFLEX MICROSCOPIC
Bilirubin Urine: NEGATIVE
Glucose, UA: NEGATIVE mg/dL
Hgb urine dipstick: NEGATIVE
Ketones, ur: NEGATIVE mg/dL
Leukocytes, UA: NEGATIVE
Nitrite: NEGATIVE
Protein, ur: NEGATIVE mg/dL
Specific Gravity, Urine: 1.021 (ref 1.005–1.030)
pH: 7 (ref 5.0–8.0)

## 2017-08-07 MED ORDER — FAMOTIDINE 20 MG PO TABS: 20.0000 mg | ORAL_TABLET | Freq: Every day | ORAL | 1 refills | Status: DC

## 2017-08-07 MED ORDER — GI COCKTAIL ~~LOC~~
30.0000 mL | Freq: Once | ORAL | Status: AC
Start: 2017-08-07 — End: 2017-08-07
  Administered 2017-08-07: 30 mL via ORAL
  Filled 2017-08-07: qty 30

## 2017-08-07 NOTE — MAU Provider Note (Signed)
Chief Complaint:  Abdominal Pain   First Provider Initiated Contact with Patient 08/07/17 0028      HPI: Tina Riggs is a 37 y.o. G2P1001 at 77w6dwho presents to maternity admissions reporting abdominal pain. She reports upper abdominal pain that radiates to her left right under her breast bone. She reports pain has been occurring for the past 1-2 weeks. She describes the pain as burning sensation that get worse after eating and laying down. She rates pain 6/10- has not taken any medication for pain. She reports pain is associated with SOB when it gets worse. She reports eating crab with butter and spicy fried chicken tonight about an hour before arrival to MAU. She reports she was laying down when she was eating and the pain got worse that made her come to MAU.  She reports good fetal movement, denies LOF, vaginal bleeding, lower abdominal pain or contractions, vaginal itching/burning, urinary symptoms, or fever/chills.    Past Medical History: Past Medical History:  Diagnosis Date  . Diverticulitis    hospitalized 04/13/2014; hospitalized 04/29/2014  . HPV in female   . Vaginal Pap smear, abnormal     Past obstetric history: OB History  Gravida Para Term Preterm AB Living  2 1 1     1   SAB TAB Ectopic Multiple Live Births          1    # Outcome Date GA Lbr Len/2nd Weight Sex Delivery Anes PTL Lv  2 Current           1 Term 2008 [redacted]w[redacted]d  7 lb 1 oz (3.204 kg) M CS-LTranv EPI  LIV     Birth Comments: c/s due to something about potassium    Past Surgical History: Past Surgical History:  Procedure Laterality Date  . CESAREAN SECTION  02/2006  . COLON SURGERY    . COLONOSCOPY N/A 06/11/2015   Procedure: COLONOSCOPY;  Surgeon: Leighton Ruff, MD;  Location: [facility];  Service: General;  Laterality: N/A;  . COLOSTOMY N/A 05/18/2014   Procedure: COLOSTOMY;  Surgeon: Georganna Skeans, MD;  Location: [place];  Service: General;  Laterality: N/A;  . COLOSTOMY REVERSAL  06/2015   robotic assisted  colostomy reversal with LOA/notes 06/16/2015  . COLOSTOMY REVISION N/A 05/18/2014   Procedure: COLON RESECTION SIGMOID;  Surgeon: Georganna Skeans, MD;  Location: [place];  Service: General;  Laterality: N/A;  . COLPOSCOPY  Sep 2012  . LAPAROSCOPIC LYSIS OF ADHESIONS N/A 05/18/2014   Procedure: LAPAROSCOPIC MOBILIZATION OF SPLENIC FLEXURE;  Surgeon: Georganna Skeans, MD;  Location: [facility];  Service: General;  Laterality: N/A;    Family History: Family History  Problem Relation Age of Onset  . HIV Mother     Social History: Social History   Tobacco Use  . Smoking status: Current Every Day Smoker    Packs/day: 0.40    Years: 18.00    Pack years: 7.20    Types: Cigarettes  . Smokeless tobacco: Never Used  Substance Use Topics  . Alcohol use: No    Alcohol/week: 0.0 oz  . Drug use: No    Allergies:  Allergies  Allergen Reactions  . Robaxin [Methocarbamol] Swelling and Other (See Comments)    Bottom lip swelled  . Penicillins Itching, Rash and Other (See Comments)    Has patient had a PCN reaction causing immediate rash, facial/tongue/throat swelling, SOB or lightheadedness with hypotension: yes Has patient had a PCN reaction causing severe rash involving mucus membranes or skin necrosis: no Has  patient had a PCN reaction that required hospitalization no Has patient had a PCN reaction occurring within the last 10 years: no If all of the above answers are "NO", then may proceed with Cephalosporin use. Pt states she has tolerated Amoxicillin in the past     Meds:  Medications Prior to Admission  Medication Sig Dispense Refill Last Dose  . Prenatal Vit-Fe Fumarate-FA (PNV PRENATAL PLUS MULTIVITAMIN) 27-1 MG TABS TAKE 1 TABLET BY MOUTH DAILY. 30 tablet 12 08/06/2017 at Unknown time    ROS:  Review of Systems  Constitutional: Negative.   Respiratory: Positive for shortness of breath. Negative for apnea, cough, chest tightness and wheezing.   Cardiovascular: Negative.    Gastrointestinal: Positive for abdominal pain. Negative for constipation, diarrhea, nausea and vomiting.       Epigastric pain  Genitourinary: Negative.   Musculoskeletal: Negative.    I have reviewed patient's Past Medical Hx, Surgical Hx, Family Hx, Social Hx, medications and allergies.   Physical Exam   Vitals:   08/07/17 0006 08/07/17 0126  BP: 130/70 117/68  Pulse: 85   Resp: 18   Temp: 98.2 F (36.8 C)    Constitutional: Well-developed, morbid obese female in no acute distress. Patient is in room laughing and talking with family.  Cardiovascular: normal rate Respiratory: normal effort GI: Abd soft, tender in epigastric region, gravid appropriate for gestational age.  MS: Extremities nontender, no edema, normal ROM Neurologic: Alert and oriented x 4.  PELVIC EXAM: deferred  FHT:  Baseline 140 , moderate variability, accelerations present, no decelerations Contractions: none    Labs: Color, Urine YELLOW YELLOW   APPearance CLEAR HAZYAbnormal    Specific Gravity, Urine 1.005 - 1.030 1.021   pH 5.0 - 8.0 7.0   Glucose, UA NEGATIVE mg/dL NEGATIVE   Hgb urine dipstick NEGATIVE NEGATIVE   Bilirubin Urine NEGATIVE NEGATIVE   Ketones, ur NEGATIVE mg/dL NEGATIVE   Protein, ur NEGATIVE mg/dL NEGATIVE   Nitrite NEGATIVE NEGATIVE   Leukocytes, UA NEGATIVE NEGATIVE    Color, Urine YELLOW YELLOW   APPearance CLEAR HAZYAbnormal    Specific Gravity, Urine 1.005 - 1.030 1.021   pH 5.0 - 8.0 7.0   Glucose, UA NEGATIVE mg/dL NEGATIVE   Hgb urine dipstick NEGATIVE NEGATIVE   Bilirubin Urine NEGATIVE NEGATIVE   Ketones, ur NEGATIVE mg/dL NEGATIVE   Protein, ur NEGATIVE mg/dL NEGATIVE   Nitrite NEGATIVE NEGATIVE   Leukocytes, UA NEGATIVE NEGATIVE    MAU Course/MDM: Orders Placed This Encounter  Procedures  . Urinalysis, Routine w reflex microscopic   UA- negative   Meds ordered this encounter  Medications  . gi cocktail (Maalox,Lidocaine,Donnatal)   NST reviewed-  reactive for gestational age  Treatments in MAU included GI cocktail- patient reports relief of epigastric pain, burning sensations and SOB with medication treatment. Educated and discussed heartburn in pregnancy, discussed need to not lay down immediately after eating and to eat in a sitting position. Patient verbalizes understanding. Rx for pepcid sent to pharmacy of choice. Discussed reasons to return to MAU.     Pt discharge. Pt stable at time of discharge.   Assessment: 1. Heartburn during pregnancy in third trimester   2. Obesity affecting pregnancy, antepartum   3. [redacted] weeks gestation of pregnancy   4. Epigastric pain during pregnancy, antepartum     Plan: Discharge home Follow up as scheduled for prenatal appointments  Return to MAU as needed for emergencies  Rx for Pepcid   Allergies as of 08/07/2017  Reactions   Robaxin [methocarbamol] Swelling, Other (See Comments)   Bottom lip swelled   Penicillins Itching, Rash, Other (See Comments)   Has patient had a PCN reaction causing immediate rash, facial/tongue/throat swelling, SOB or lightheadedness with hypotension: yes Has patient had a PCN reaction causing severe rash involving mucus membranes or skin necrosis: no Has patient had a PCN reaction that required hospitalization no Has patient had a PCN reaction occurring within the last 10 years: no If all of the above answers are "NO", then may proceed with Cephalosporin use. Pt states she has tolerated Amoxicillin in the past      Medication List    TAKE these medications   famotidine 20 MG tablet Commonly known as:  PEPCID Take 1 tablet (20 mg total) by mouth daily.   PNV PRENATAL PLUS MULTIVITAMIN 27-1 MG Tabs TAKE 1 TABLET BY MOUTH DAILY.       Darrol Poke Certified Nurse-Midwife 08/07/2017 2:41 AM

## 2017-08-07 NOTE — MAU Note (Signed)
Pt presents to MAU with pain in breastbone area x 1-2 weeks. Pain has gotten sharper with laying down, +FM, denies VB or LOF.  +SOB with pain.  Last meal was about an hour ago crab meat with butter then layed down and pain got worse. + tobacco use

## 2017-08-13 ENCOUNTER — Other Ambulatory Visit: Payer: Self-pay | Admitting: Obstetrics and Gynecology

## 2017-08-13 ENCOUNTER — Encounter (HOSPITAL_COMMUNITY): Payer: Self-pay

## 2017-08-13 ENCOUNTER — Ambulatory Visit (HOSPITAL_COMMUNITY)
Admission: RE | Admit: 2017-08-13 | Discharge: 2017-08-13 | Disposition: A | Discharge status: Home / Self Care | Payer: Medicaid Other | Source: Ambulatory Visit | Attending: Obstetrics and Gynecology | Admitting: Obstetrics and Gynecology

## 2017-08-13 DIAGNOSIS — O3413 Maternal care for benign tumor of corpus uteri, third trimester: Secondary | ICD-10-CM

## 2017-08-13 DIAGNOSIS — O34219 Maternal care for unspecified type scar from previous cesarean delivery: Secondary | ICD-10-CM | POA: Diagnosis present

## 2017-08-13 DIAGNOSIS — Z3A29 29 weeks gestation of pregnancy: Secondary | ICD-10-CM

## 2017-08-13 DIAGNOSIS — D259 Leiomyoma of uterus, unspecified: Secondary | ICD-10-CM | POA: Diagnosis not present

## 2017-08-13 DIAGNOSIS — O99213 Obesity complicating pregnancy, third trimester: Secondary | ICD-10-CM | POA: Diagnosis present

## 2017-08-13 DIAGNOSIS — O99333 Smoking (tobacco) complicating pregnancy, third trimester: Secondary | ICD-10-CM

## 2017-08-13 DIAGNOSIS — Z98891 History of uterine scar from previous surgery: Secondary | ICD-10-CM

## 2017-08-13 DIAGNOSIS — O09523 Supervision of elderly multigravida, third trimester: Secondary | ICD-10-CM

## 2017-08-13 DIAGNOSIS — Z6841 Body Mass Index (BMI) 40.0 and over, adult: Secondary | ICD-10-CM

## 2017-08-13 DIAGNOSIS — O99212 Obesity complicating pregnancy, second trimester: Secondary | ICD-10-CM | POA: Diagnosis not present

## 2017-08-13 DIAGNOSIS — O99210 Obesity complicating pregnancy, unspecified trimester: Secondary | ICD-10-CM

## 2017-08-13 DIAGNOSIS — O09899 Supervision of other high risk pregnancies, unspecified trimester: Secondary | ICD-10-CM

## 2017-08-14 ENCOUNTER — Ambulatory Visit (HOSPITAL_COMMUNITY): Payer: Medicaid Other

## 2017-08-24 ENCOUNTER — Ambulatory Visit (INDEPENDENT_AMBULATORY_CARE_PROVIDER_SITE_OTHER): Payer: Medicaid Other | Admitting: Obstetrics and Gynecology

## 2017-08-24 ENCOUNTER — Encounter: Payer: Self-pay | Admitting: Obstetrics and Gynecology

## 2017-08-24 VITALS — BP 115/73 | HR 98 | Wt 310.2 lb

## 2017-08-24 DIAGNOSIS — O09899 Supervision of other high risk pregnancies, unspecified trimester: Secondary | ICD-10-CM

## 2017-08-24 DIAGNOSIS — O09529 Supervision of elderly multigravida, unspecified trimester: Secondary | ICD-10-CM

## 2017-08-24 DIAGNOSIS — Z98891 History of uterine scar from previous surgery: Secondary | ICD-10-CM

## 2017-08-24 DIAGNOSIS — O99210 Obesity complicating pregnancy, unspecified trimester: Secondary | ICD-10-CM

## 2017-08-24 NOTE — Progress Notes (Signed)
Pt seen @ MAU on 8/6 due to abdominal pain . She states the Pepcid is not working for her heartburn and she has stopped taking it.

## 2017-08-24 NOTE — Progress Notes (Signed)
   PRENATAL VISIT NOTE  Subjective:  Tina Riggs is a 37 y.o. G2P1001 at [redacted]w[redacted]d being seen today for ongoing prenatal care.  She is currently monitored for the following issues for this high-risk pregnancy and has Diverticulitis of colon with perforation; Tobacco use; Diverticular disease; Supervision of other high risk pregnancy, antepartum; Obesity affecting pregnancy, antepartum; Morbid obesity with BMI of 45.0-49.9, adult ([place]); Advanced maternal age in multigravida; Abdominal pain; and History of cesarean section on their problem list.  Patient reports heartburn.  Contractions: Not present. Vag. Bleeding: None.  Movement: Present. Denies leaking of fluid.   The following portions of the patient's history were reviewed and updated as appropriate: allergies, current medications, past family history, past medical history, past social history, past surgical history and problem list. Problem list updated.  Objective:   Vitals:   08/24/17 1057  BP: 115/73  Pulse: 98  Weight: (!) 310 lb 3.2 oz (140.7 kg)    Fetal Status: Fetal Heart Rate (bpm): 154   Movement: Present     General:  Alert, oriented and cooperative. Patient is in no acute distress.  Skin: Skin is warm and dry. No rash noted.   Cardiovascular: Normal heart rate noted  Respiratory: Normal respiratory effort, no problems with respiration noted  Abdomen: Soft, gravid, appropriate for gestational age.  Pain/Pressure: Present     Pelvic: Cervical exam deferred        Extremities: Normal range of motion.     Mental Status: Normal mood and affect. Normal behavior. Normal judgment and thought content.   Assessment and Plan:  Pregnancy: G2P1001 at [redacted]w[redacted]d  1. Supervision of other high risk pregnancy, antepartum   2. Obesity affecting pregnancy, antepartum  3. Antepartum multigravida of advanced maternal age  70. History of cesarean section Reviewed risks/benefits of TOLAC versus RCS in detail. Patient counseled regarding  potential vaginal delivery, chance of success, future implications, possible uterine rupture and need for urgent/emergent repeat cesarean. Counseled regarding potential need for repeat c-section for reasons unrelated to first c-section. Counseled regarding scheduled repeat cesarean including risks of bleeding, infection, damage to surrounding tissue, abnormal placentation, implications for future pregnancies. All questions answered.  Patient desires RCS, consent signed 08/24/2017. - RCS scheduled today   Preterm labor symptoms and general obstetric precautions including but not limited to vaginal bleeding, contractions, leaking of fluid and fetal movement were reviewed in detail with the patient. Please refer to After Visit Summary for other counseling recommendations.  Return in about 2 weeks (around 09/07/2017) for OB visit (MD).  Future Appointments  Date Time Provider [place]  09/10/2017  1:15 PM Chancy Milroy, MD [facility]  09/28/2017 10:15 AM [facility]-MFC [place] 4 [facility]-MFCUS [facility]-US    Sloan Leiter, MD

## 2017-08-27 ENCOUNTER — Encounter (HOSPITAL_COMMUNITY): Payer: Self-pay

## 2017-09-05 ENCOUNTER — Other Ambulatory Visit: Payer: Self-pay | Admitting: Family Medicine

## 2017-09-05 ENCOUNTER — Encounter: Payer: Self-pay | Admitting: *Deleted

## 2017-09-07 ENCOUNTER — Encounter: Payer: Self-pay | Admitting: *Deleted

## 2017-09-10 ENCOUNTER — Encounter: Payer: Self-pay | Admitting: Obstetrics and Gynecology

## 2017-09-10 ENCOUNTER — Ambulatory Visit (INDEPENDENT_AMBULATORY_CARE_PROVIDER_SITE_OTHER): Payer: Medicaid Other | Admitting: Obstetrics and Gynecology

## 2017-09-10 VITALS — BP 117/63 | HR 91 | Wt 301.6 lb

## 2017-09-10 DIAGNOSIS — O09899 Supervision of other high risk pregnancies, unspecified trimester: Secondary | ICD-10-CM

## 2017-09-10 DIAGNOSIS — O09893 Supervision of other high risk pregnancies, third trimester: Secondary | ICD-10-CM

## 2017-09-10 DIAGNOSIS — O34219 Maternal care for unspecified type scar from previous cesarean delivery: Secondary | ICD-10-CM

## 2017-09-10 DIAGNOSIS — O09523 Supervision of elderly multigravida, third trimester: Secondary | ICD-10-CM

## 2017-09-10 DIAGNOSIS — Z98891 History of uterine scar from previous surgery: Secondary | ICD-10-CM

## 2017-09-10 NOTE — Progress Notes (Signed)
Subjective:  Tina Riggs is a 37 y.o. G2P1001 at [redacted]w[redacted]d being seen today for ongoing prenatal care.  She is currently monitored for the following issues for this high-risk pregnancy and has Diverticulitis of colon with perforation; Tobacco use; Diverticular disease; Supervision of other high risk pregnancy, antepartum; Obesity affecting pregnancy, antepartum; Morbid obesity with BMI of 45.0-49.9, adult ([place]); Advanced maternal age in multigravida; Abdominal pain; and History of cesarean section on their problem list.  Patient reports general discomforts of pregnancy.  Contractions: Not present. Vag. Bleeding: None.  Movement: Present. Denies leaking of fluid.   The following portions of the patient's history were reviewed and updated as appropriate: allergies, current medications, past family history, past medical history, past social history, past surgical history and problem list. Problem list updated.  Objective:   Vitals:   09/10/17 1324  BP: 117/63  Pulse: 91  Weight: (!) 301 lb 9.6 oz (136.8 kg)    Fetal Status: Fetal Heart Rate (bpm): 164   Movement: Present     General:  Alert, oriented and cooperative. Patient is in no acute distress.  Skin: Skin is warm and dry. No rash noted.   Cardiovascular: Normal heart rate noted  Respiratory: Normal respiratory effort, no problems with respiration noted  Abdomen: Soft, gravid, appropriate for gestational age. Pain/Pressure: Absent     Pelvic:  Cervical exam deferred        Extremities: Normal range of motion.  Edema: None  Mental Status: Normal mood and affect. Normal behavior. Normal judgment and thought content.   Urinalysis:      Assessment and Plan:  Pregnancy: G2P1001 at [redacted]w[redacted]d  1. Supervision of other high risk pregnancy, antepartum Stable Declined flu vaccine  2. History of cesarean section Repeat 10/17/17  3. Multigravida of advanced maternal age in third trimester [place]  Preterm labor symptoms and general  obstetric precautions including but not limited to vaginal bleeding, contractions, leaking of fluid and fetal movement were reviewed in detail with the patient. Please refer to After Visit Summary for other counseling recommendations.  Return in about 2 weeks (around 09/24/2017) for OB visit.   Chancy Milroy, MD

## 2017-09-10 NOTE — Patient Instructions (Signed)
Third Trimester of Pregnancy The third trimester is from week 28 through week 40 (months 7 through 9). The third trimester is a time when the unborn baby (fetus) is growing rapidly. At the end of the ninth month, the fetus is about 20 inches in length and weighs 6-10 pounds. Body changes during your third trimester Your body will continue to go through many changes during pregnancy. The changes vary from woman to woman. During the third trimester:  Your weight will continue to increase. You can expect to gain 25-35 pounds (11-16 kg) by the end of the pregnancy.  You may begin to get stretch marks on your hips, abdomen, and breasts.  You may urinate more often because the fetus is moving lower into your pelvis and pressing on your bladder.  You may develop or continue to have heartburn. This is caused by increased hormones that slow down muscles in the digestive tract.  You may develop or continue to have constipation because increased hormones slow digestion and cause the muscles that push waste through your intestines to relax.  You may develop hemorrhoids. These are swollen veins (varicose veins) in the rectum that can itch or be painful.  You may develop swollen, bulging veins (varicose veins) in your legs.  You may have increased body aches in the pelvis, back, or thighs. This is due to weight gain and increased hormones that are relaxing your joints.  You may have changes in your hair. These can include thickening of your hair, rapid growth, and changes in texture. Some women also have hair loss during or after pregnancy, or hair that feels dry or thin. Your hair will most likely return to normal after your baby is born.  Your breasts will continue to grow and they will continue to become tender. A yellow fluid (colostrum) may leak from your breasts. This is the first milk you are producing for your baby.  Your belly button may stick out.  You may notice more swelling in your hands,  face, or ankles.  You may have increased tingling or numbness in your hands, arms, and legs. The skin on your belly may also feel numb.  You may feel short of breath because of your expanding uterus.  You may have more problems sleeping. This can be caused by the size of your belly, increased need to urinate, and an increase in your body's metabolism.  You may notice the fetus "dropping," or moving lower in your abdomen (lightening).  You may have increased vaginal discharge.  You may notice your joints feel loose and you may have pain around your pelvic bone.  What to expect at prenatal visits You will have prenatal exams every 2 weeks until week 36. Then you will have weekly prenatal exams. During a routine prenatal visit:  You will be weighed to make sure you and the baby are growing normally.  Your blood pressure will be taken.  Your abdomen will be measured to track your baby's growth.  The fetal heartbeat will be listened to.  Any test results from the previous visit will be discussed.  You may have a cervical check near your due date to see if your cervix has softened or thinned (effaced).  You will be tested for Group B streptococcus. This happens between 35 and 37 weeks.  Your health care provider may ask you:  What your birth plan is.  How you are feeling.  If you are feeling the baby move.  If you have had   any abnormal symptoms, such as leaking fluid, bleeding, severe headaches, or abdominal cramping.  If you are using any tobacco products, including cigarettes, chewing tobacco, and electronic cigarettes.  If you have any questions.  Other tests or screenings that may be performed during your third trimester include:  Blood tests that check for low iron levels (anemia).  Fetal testing to check the health, activity level, and growth of the fetus. Testing is done if you have certain medical conditions or if there are problems during the  pregnancy.  Nonstress test (NST). This test checks the health of your baby to make sure there are no signs of problems, such as the baby not getting enough oxygen. During this test, a belt is placed around your belly. The baby is made to move, and its heart rate is monitored during movement.  What is false labor? False labor is a condition in which you feel small, irregular tightenings of the muscles in the womb (contractions) that usually go away with rest, changing position, or drinking water. These are called Braxton Hicks contractions. Contractions may last for hours, days, or even weeks before true labor sets in. If contractions come at regular intervals, become more frequent, increase in intensity, or become painful, you should see your health care provider. What are the signs of labor?  Abdominal cramps.  Regular contractions that start at 10 minutes apart and become stronger and more frequent with time.  Contractions that start on the top of the uterus and spread down to the lower abdomen and back.  Increased pelvic pressure and dull back pain.  A watery or bloody mucus discharge that comes from the vagina.  Leaking of amniotic fluid. This is also known as your "water breaking." It could be a slow trickle or a gush. Let your health care provider know if it has a color or strange odor. If you have any of these signs, call your health care provider right away, even if it is before your due date. Follow these instructions at home: Medicines  Follow your health care provider's instructions regarding medicine use. Specific medicines may be either safe or unsafe to take during pregnancy.  Take a prenatal vitamin that contains at least 600 micrograms (mcg) of folic acid.  If you develop constipation, try taking a stool softener if your health care provider approves. Eating and drinking  Eat a balanced diet that includes fresh fruits and vegetables, whole grains, good sources of protein  such as meat, eggs, or tofu, and low-fat dairy. Your health care provider will help you determine the amount of weight gain that is right for you.  Avoid raw meat and uncooked cheese. These carry germs that can cause birth defects in the baby.  If you have low calcium intake from food, talk to your health care provider about whether you should take a daily calcium supplement.  Eat four or five small meals rather than three large meals a day.  Limit foods that are high in fat and processed sugars, such as fried and sweet foods.  To prevent constipation: ? Drink enough fluid to keep your urine clear or pale yellow. ? Eat foods that are high in fiber, such as fresh fruits and vegetables, whole grains, and beans. Activity  Exercise only as directed by your health care provider. Most women can continue their usual exercise routine during pregnancy. Try to exercise for 30 minutes at least 5 days a week. Stop exercising if you experience uterine contractions.  Avoid heavy   lifting.  Do not exercise in extreme heat or humidity, or at high altitudes.  Wear low-heel, comfortable shoes.  Practice good posture.  You may continue to have sex unless your health care provider tells you otherwise. Relieving pain and discomfort  Take frequent breaks and rest with your legs elevated if you have leg cramps or low back pain.  Take warm sitz baths to soothe any pain or discomfort caused by hemorrhoids. Use hemorrhoid cream if your health care provider approves.  Wear a good support bra to prevent discomfort from breast tenderness.  If you develop varicose veins: ? Wear support pantyhose or compression stockings as told by your healthcare provider. ? Elevate your feet for 15 minutes, 3-4 times a day. Prenatal care  Write down your questions. Take them to your prenatal visits.  Keep all your prenatal visits as told by your health care provider. This is important. Safety  Wear your seat belt at  all times when driving.  Make a list of emergency phone numbers, including numbers for family, friends, the hospital, and police and fire departments. General instructions  Avoid cat litter boxes and soil used by cats. These carry germs that can cause birth defects in the baby. If you have a cat, ask someone to clean the litter box for you.  Do not travel far distances unless it is absolutely necessary and only with the approval of your health care provider.  Do not use hot tubs, steam rooms, or saunas.  Do not drink alcohol.  Do not use any products that contain nicotine or tobacco, such as cigarettes and e-cigarettes. If you need help quitting, ask your health care provider.  Do not use any medicinal herbs or unprescribed drugs. These chemicals affect the formation and growth of the baby.  Do not douche or use tampons or scented sanitary pads.  Do not cross your legs for long periods of time.  To prepare for the arrival of your baby: ? Take prenatal classes to understand, practice, and ask questions about labor and delivery. ? Make a trial run to the hospital. ? Visit the hospital and tour the maternity area. ? Arrange for maternity or paternity leave through employers. ? Arrange for family and friends to take care of pets while you are in the hospital. ? Purchase a rear-facing car seat and make sure you know how to install it in your car. ? Pack your hospital bag. ? Prepare the baby's nursery. Make sure to remove all pillows and stuffed animals from the baby's crib to prevent suffocation.  Visit your dentist if you have not gone during your pregnancy. Use a soft toothbrush to brush your teeth and be gentle when you floss. Contact a health care provider if:  You are unsure if you are in labor or if your water has broken.  You become dizzy.  You have mild pelvic cramps, pelvic pressure, or nagging pain in your abdominal area.  You have lower back pain.  You have persistent  nausea, vomiting, or diarrhea.  You have an unusual or bad smelling vaginal discharge.  You have pain when you urinate. Get help right away if:  Your water breaks before 37 weeks.  You have regular contractions less than 5 minutes apart before 37 weeks.  You have a fever.  You are leaking fluid from your vagina.  You have spotting or bleeding from your vagina.  You have severe abdominal pain or cramping.  You have rapid weight loss or weight gain.    You have shortness of breath with chest pain.  You notice sudden or extreme swelling of your face, hands, ankles, feet, or legs.  Your baby makes fewer than 10 movements in 2 hours.  You have severe headaches that do not go away when you take medicine.  You have vision changes. Summary  The third trimester is from week 28 through week 40, months 7 through 9. The third trimester is a time when the unborn baby (fetus) is growing rapidly.  During the third trimester, your discomfort may increase as you and your baby continue to gain weight. You may have abdominal, leg, and back pain, sleeping problems, and an increased need to urinate.  During the third trimester your breasts will keep growing and they will continue to become tender. A yellow fluid (colostrum) may leak from your breasts. This is the first milk you are producing for your baby.  False labor is a condition in which you feel small, irregular tightenings of the muscles in the womb (contractions) that eventually go away. These are called Braxton Hicks contractions. Contractions may last for hours, days, or even weeks before true labor sets in.  Signs of labor can include: abdominal cramps; regular contractions that start at 10 minutes apart and become stronger and more frequent with time; watery or bloody mucus discharge that comes from the vagina; increased pelvic pressure and dull back pain; and leaking of amniotic fluid. This information is not intended to replace advice  given to you by your health care provider. Make sure you discuss any questions you have with your health care provider. Document Released: 12/13/2000 Document Revised: 05/27/2015 Document Reviewed: 02/20/2012 Elsevier Interactive Patient Education  2017 [employer]  

## 2017-09-11 ENCOUNTER — Other Ambulatory Visit: Payer: Self-pay

## 2017-09-11 ENCOUNTER — Encounter (HOSPITAL_COMMUNITY): Payer: Self-pay

## 2017-09-11 ENCOUNTER — Inpatient Hospital Stay (HOSPITAL_BASED_OUTPATIENT_CLINIC_OR_DEPARTMENT_OTHER): Payer: Medicaid Other

## 2017-09-11 ENCOUNTER — Inpatient Hospital Stay (HOSPITAL_COMMUNITY)
Admission: AD | Admit: 2017-09-11 | Discharge: 2017-09-12 | Disposition: A | Discharge status: Home / Self Care | Payer: Medicaid Other | Source: Ambulatory Visit | Attending: Obstetrics & Gynecology | Admitting: Obstetrics & Gynecology

## 2017-09-11 DIAGNOSIS — O99333 Smoking (tobacco) complicating pregnancy, third trimester: Secondary | ICD-10-CM

## 2017-09-11 DIAGNOSIS — O4703 False labor before 37 completed weeks of gestation, third trimester: Secondary | ICD-10-CM | POA: Diagnosis not present

## 2017-09-11 DIAGNOSIS — E669 Obesity, unspecified: Secondary | ICD-10-CM | POA: Insufficient documentation

## 2017-09-11 DIAGNOSIS — O26893 Other specified pregnancy related conditions, third trimester: Secondary | ICD-10-CM | POA: Diagnosis present

## 2017-09-11 DIAGNOSIS — O99323 Drug use complicating pregnancy, third trimester: Secondary | ICD-10-CM | POA: Diagnosis not present

## 2017-09-11 DIAGNOSIS — D259 Leiomyoma of uterus, unspecified: Secondary | ICD-10-CM | POA: Diagnosis not present

## 2017-09-11 DIAGNOSIS — O99213 Obesity complicating pregnancy, third trimester: Secondary | ICD-10-CM | POA: Insufficient documentation

## 2017-09-11 DIAGNOSIS — Z3A33 33 weeks gestation of pregnancy: Secondary | ICD-10-CM | POA: Diagnosis present

## 2017-09-11 DIAGNOSIS — Z880 Allergy status to penicillin: Secondary | ICD-10-CM | POA: Diagnosis not present

## 2017-09-11 DIAGNOSIS — O3413 Maternal care for benign tumor of corpus uteri, third trimester: Secondary | ICD-10-CM | POA: Insufficient documentation

## 2017-09-11 DIAGNOSIS — F1410 Cocaine abuse, uncomplicated: Secondary | ICD-10-CM | POA: Diagnosis not present

## 2017-09-11 DIAGNOSIS — O09523 Supervision of elderly multigravida, third trimester: Secondary | ICD-10-CM | POA: Diagnosis not present

## 2017-09-11 DIAGNOSIS — R109 Unspecified abdominal pain: Secondary | ICD-10-CM | POA: Insufficient documentation

## 2017-09-11 DIAGNOSIS — F17210 Nicotine dependence, cigarettes, uncomplicated: Secondary | ICD-10-CM | POA: Diagnosis not present

## 2017-09-11 DIAGNOSIS — O34219 Maternal care for unspecified type scar from previous cesarean delivery: Secondary | ICD-10-CM | POA: Diagnosis not present

## 2017-09-11 LAB — CBC WITH DIFFERENTIAL/PLATELET
Basophils Absolute: 0 10*3/uL (ref 0.0–0.1)
Basophils Relative: 0 %
Eosinophils Absolute: 0.1 10*3/uL (ref 0.0–0.7)
Eosinophils Relative: 1 %
HCT: 32.5 % — ABNORMAL LOW (ref 36.0–46.0)
Hemoglobin: 10.6 g/dL — ABNORMAL LOW (ref 12.0–15.0)
LYMPHS ABS: 2.2 10*3/uL (ref 0.7–4.0)
LYMPHS PCT: 23 %
MCH: 29.4 pg (ref 26.0–34.0)
MCHC: 32.6 g/dL (ref 30.0–36.0)
MCV: 90 fL (ref 78.0–100.0)
MONO ABS: 0.3 10*3/uL (ref 0.1–1.0)
Monocytes Relative: 3 %
Neutro Abs: 6.9 10*3/uL (ref 1.7–7.7)
Neutrophils Relative %: 73 %
Platelets: 360 10*3/uL (ref 150–400)
RBC: 3.61 MIL/uL — ABNORMAL LOW (ref 3.87–5.11)
RDW: 14.7 % (ref 11.5–15.5)
WBC: 9.5 10*3/uL (ref 4.0–10.5)

## 2017-09-11 LAB — URINALYSIS, MICROSCOPIC (REFLEX): Squamous Epithelial / LPF: >50 (ref 0–5)

## 2017-09-11 LAB — URINALYSIS, ROUTINE W REFLEX MICROSCOPIC
Glucose, UA: NEGATIVE mg/dL
Ketones, ur: 15 mg/dL — AB
Leukocytes, UA: NEGATIVE
Nitrite: NEGATIVE
PH: 6.5 (ref 5.0–8.0)
Protein, ur: 100 mg/dL — AB
SPECIFIC GRAVITY, URINE: 1.025 (ref 1.005–1.030)

## 2017-09-11 LAB — RAPID URINE DRUG SCREEN, HOSP PERFORMED
AMPHETAMINES: NOT DETECTED
Barbiturates: NOT DETECTED
Benzodiazepines: NOT DETECTED
COCAINE: POSITIVE — AB
OPIATES: NOT DETECTED
TETRAHYDROCANNABINOL: NOT DETECTED

## 2017-09-11 LAB — COMPREHENSIVE METABOLIC PANEL
ALK PHOS: 184 U/L — AB (ref 38–126)
ALT: 17 U/L (ref 0–44)
AST: 20 U/L (ref 15–41)
Albumin: 3 g/dL — ABNORMAL LOW (ref 3.5–5.0)
Anion gap: 12 (ref 5–15)
BUN: 6 mg/dL (ref 6–20)
CHLORIDE: 103 mmol/L (ref 98–111)
CO2: 18 mmol/L — ABNORMAL LOW (ref 22–32)
Calcium: 8.9 mg/dL (ref 8.9–10.3)
Creatinine, Ser: 0.55 mg/dL (ref 0.44–1.00)
Glucose, Bld: 84 mg/dL (ref 70–99)
Potassium: 4 mmol/L (ref 3.5–5.1)
Sodium: 133 mmol/L — ABNORMAL LOW (ref 135–145)
Total Bilirubin: 0.3 mg/dL (ref 0.3–1.2)
Total Protein: 6.7 g/dL (ref 6.5–8.1)

## 2017-09-11 MED ORDER — ACETAMINOPHEN 325 MG PO TABS
650.0000 mg | ORAL_TABLET | Freq: Once | ORAL | Status: DC
  Filled 2017-09-11: qty 2

## 2017-09-11 MED ORDER — LACTATED RINGERS IV SOLN
INTRAVENOUS | Status: DC
  Administered 2017-09-11 (×2): via INTRAVENOUS

## 2017-09-11 MED ORDER — NIFEDIPINE 10 MG PO CAPS
20.0000 mg | ORAL_CAPSULE | Freq: Once | ORAL | Status: AC
  Administered 2017-09-11: 20 mg via ORAL
  Filled 2017-09-11: qty 2

## 2017-09-11 NOTE — MAU Note (Signed)
Pt states that she started having ctx this morning at 0900.   Reports thick white vaginal discharge

## 2017-09-11 NOTE — MAU Provider Note (Signed)
History     CSN: [id number]  Arrival date and time: 09/11/17 1652   None     Chief Complaint  Patient presents with  . Contractions   HPI  Tina Riggs is 89 you G2P1001 IUP 33 6/7 weeks presents with c/o abd pain since this morning. Describes pain as crampy, comes and goes. She denies N/V/F/C. BM's have been normal, last being this morning. She reports + FM, denies LOF, vaginal bleeding or recent IC.  Pt with H/O diverticulitis with colon perforation requiring colostomy in 2016. She has had one episode during this pregnancy during the first trimester. Treated with outpt antibiotics.   Past Medical History:  Diagnosis Date  . Diverticulitis    hospitalized 04/13/2014; hospitalized 04/29/2014  . HPV in female   . Vaginal Pap smear, abnormal     Past Surgical History:  Procedure Laterality Date  . CESAREAN SECTION  02/2006  . COLON SURGERY    . COLONOSCOPY N/A 06/11/2015   Procedure: COLONOSCOPY;  Surgeon: Leighton Ruff, MD;  Location: [facility];  Service: General;  Laterality: N/A;  . COLOSTOMY N/A 05/18/2014   Procedure: COLOSTOMY;  Surgeon: Georganna Skeans, MD;  Location: [facility];  Service: General;  Laterality: N/A;  . COLOSTOMY REVERSAL  06/2015   robotic assisted colostomy reversal with LOA/notes 06/16/2015  . COLOSTOMY REVISION N/A 05/18/2014   Procedure: COLON RESECTION SIGMOID;  Surgeon: Georganna Skeans, MD;  Location: [facility];  Service: General;  Laterality: N/A;  . COLPOSCOPY  Sep 2012  . LAPAROSCOPIC LYSIS OF ADHESIONS N/A 05/18/2014   Procedure: LAPAROSCOPIC MOBILIZATION OF SPLENIC FLEXURE;  Surgeon: Georganna Skeans, MD;  Location: [facility];  Service: General;  Laterality: N/A;    Family History  Problem Relation Age of Onset  . HIV Mother     Social History   Tobacco Use  . Smoking status: Current Every Day Smoker    Packs/day: 0.40    Years: 18.00    Pack years: 7.20    Types: Cigarettes  . Smokeless tobacco: Never Used  Substance Use Topics  . Alcohol use: Yes   Alcohol/week: 0.0 standard drinks    Comment: wine  . Drug use: No    Allergies:  Allergies  Allergen Reactions  . Robaxin [Methocarbamol] Swelling and Other (See Comments)    Bottom lip swelled  . Penicillins Itching, Rash and Other (See Comments)    Has patient had a PCN reaction causing immediate rash, facial/tongue/throat swelling, SOB or lightheadedness with hypotension: yes Has patient had a PCN reaction causing severe rash involving mucus membranes or skin necrosis: no Has patient had a PCN reaction that required hospitalization no Has patient had a PCN reaction occurring within the last 10 years: no If all of the above answers are "NO", then may proceed with Cephalosporin use. Pt states she has tolerated Amoxicillin in the past     Medications Prior to Admission  Medication Sig Dispense Refill Last Dose  . famotidine (PEPCID) 20 MG tablet Take 1 tablet (20 mg total) by mouth daily. (Patient not taking: Reported on 08/24/2017) 30 tablet 1 Not Taking  . Prenatal Vit-Fe Fumarate-FA (PNV PRENATAL PLUS MULTIVITAMIN) 27-1 MG TABS TAKE 1 TABLET BY MOUTH DAILY. 30 tablet 12 Taking    Review of Systems  Constitutional: Negative.   Cardiovascular: Negative.   Gastrointestinal: Positive for abdominal pain.  Genitourinary: Negative.    Physical Exam   Blood pressure 131/83, pulse 78, temperature 98.1 F (36.7 C), temperature source Oral, resp. rate 20, last  menstrual period 01/17/2017, SpO2 100 %.  Physical Exam  Constitutional: She appears well-developed and well-nourished.  Cardiovascular: Normal heart sounds.  Respiratory: Effort normal and breath sounds normal.  GI:  Gravid, soft, hypoactive BS, diffuse midline tenderness, voluntary guarding, no rebound  Genitourinary:  Genitourinary Comments: Cervix LTC    MAU Course  Procedures  MDM Care turned over to Kris Hartmann at 8:48 PM  Assessment and Plan     Tina Riggs 09/11/2017, 5:42 PM

## 2017-09-11 NOTE — Discharge Instructions (Signed)

## 2017-09-11 NOTE — MAU Provider Note (Signed)
Subjective:  Tina Riggs is a 37 y.o. G2P1001 at [redacted]w[redacted]d being seen today for ongoing prenatal care.  She is currently monitored for the following issues for this high-risk pregnancy and has Diverticulitis of colon with perforation; Tobacco use; Diverticular disease; Supervision of other high risk pregnancy, antepartum; Obesity affecting pregnancy, antepartum; Morbid obesity with BMI of 45.0-49.9, adult ([place]); Advanced maternal age in multigravida; Abdominal pain; and History of cesarean section on their problem list.  Patient reports general discomforts of pregnancy.  Contractions: Not present. Vag. Bleeding: None.  Movement: Present. Denies leaking of fluid.   The following portions of the patient's history were reviewed and updated as appropriate: allergies, current medications, past family history, past medical history, past social history, past surgical history and problem list. Problem list updated.  Objective:      Vitals:   09/10/17 1324  BP: 117/63  Pulse: 91  Weight: (!) 301 lb 9.6 oz (136.8 kg)    Fetal Status: Fetal Heart Rate (bpm): 164   Movement: Present     General:  Alert, oriented and cooperative. Patient is in no acute distress.  Skin: Skin is warm and dry. No rash noted.   Cardiovascular: Normal heart rate noted  Respiratory: Normal respiratory effort, no problems with respiration noted  Abdomen: Soft, gravid, appropriate for gestational age. Pain/Pressure: Absent     Pelvic:  Cervical exam deferred        Extremities: Normal range of motion.  Edema: None  Mental Status: Normal mood and affect. Normal behavior. Normal judgment and thought content.   Urinalysis:      Assessment and Plan:  Pregnancy: G2P1001 at [redacted]w[redacted]d  1. Supervision of other high risk pregnancy, antepartum Stable Declined flu vaccine  2. History of cesarean section Repeat 10/17/17  3. Multigravida of advanced maternal age in third trimester Panorama LR  Preterm labor  symptoms and general obstetric precautions including but not limited to vaginal bleeding, contractions, leaking of fluid and fetal movement were reviewed in detail with the patient. Please refer to After Visit Summary for other counseling recommendations.  Return in about 2 weeks (around 09/24/2017) for OB visit.   Chancy Milroy, MD         After pt came back from [place], EFM showed ctx q 2-3 minutes, c/w pt's pain.  She responded very well to 2 doses of procardia po.  She still thought that her pain could be from diverticulits, so a MRI was ordered.  However, once her ctx stopped, all of her pain stopped, so she declined the MRI.   Pt states that she doesn't have a cocaine problem, and declined a referral to drug rehab. She states that she "just needs to stay away from all these stressors."  Offered a counseling referral w/Jaime Mcmannus, happily accepted. Message sent.   NST: FHR baseline 145\ bpm, Variability: moderate, Accelerations:present, Decelerations:  Absent= Cat 1/Reactive  Pt D'C'd with a pain level of 0.

## 2017-09-12 ENCOUNTER — Telehealth: Payer: Self-pay | Admitting: Clinical

## 2017-09-12 NOTE — MAU Note (Signed)
Pt declined transportation to [facility] for MRI. CNM informed.

## 2017-09-12 NOTE — Telephone Encounter (Signed)
Pt follow-up w pt, per pt request to Christin Fudge, CNM; Pt is informed that [facility] Roselyn Reef will not be available during her upcoming medical visit on 09/27/17, so pt says she will walk in on one day in the upcoming week to see [facility], depending on her work-at-home schedule. Pt does not want to set up an appointment ahead of time, as her schedule may change.   Pt praises a [facility] employee named "Amalita", who was "an angel" to her during her last visit to the hospital, helping her to bear the pain and stress she was feeling at the time.

## 2017-09-27 ENCOUNTER — Encounter: Payer: Medicaid Other | Admitting: Family Medicine

## 2017-09-28 ENCOUNTER — Ambulatory Visit (HOSPITAL_COMMUNITY): Payer: Medicaid Other

## 2017-10-02 ENCOUNTER — Encounter (HOSPITAL_COMMUNITY): Payer: Self-pay

## 2017-10-04 ENCOUNTER — Ambulatory Visit (HOSPITAL_COMMUNITY)
Admission: RE | Admit: 2017-10-04 | Discharge: 2017-10-04 | Disposition: A | Discharge status: Home / Self Care | Payer: Medicaid Other | Source: Ambulatory Visit | Attending: Obstetrics and Gynecology | Admitting: Obstetrics and Gynecology

## 2017-10-04 DIAGNOSIS — O09893 Supervision of other high risk pregnancies, third trimester: Secondary | ICD-10-CM | POA: Diagnosis not present

## 2017-10-04 DIAGNOSIS — Z029 Encounter for administrative examinations, unspecified: Secondary | ICD-10-CM

## 2017-10-04 DIAGNOSIS — O09529 Supervision of elderly multigravida, unspecified trimester: Secondary | ICD-10-CM

## 2017-10-04 DIAGNOSIS — O09523 Supervision of elderly multigravida, third trimester: Secondary | ICD-10-CM | POA: Diagnosis not present

## 2017-10-04 DIAGNOSIS — O99333 Smoking (tobacco) complicating pregnancy, third trimester: Secondary | ICD-10-CM | POA: Diagnosis not present

## 2017-10-04 DIAGNOSIS — O09899 Supervision of other high risk pregnancies, unspecified trimester: Secondary | ICD-10-CM

## 2017-10-04 DIAGNOSIS — O99210 Obesity complicating pregnancy, unspecified trimester: Secondary | ICD-10-CM

## 2017-10-04 DIAGNOSIS — O34219 Maternal care for unspecified type scar from previous cesarean delivery: Secondary | ICD-10-CM

## 2017-10-04 DIAGNOSIS — Z3A37 37 weeks gestation of pregnancy: Secondary | ICD-10-CM | POA: Insufficient documentation

## 2017-10-04 DIAGNOSIS — O359XX0 Maternal care for (suspected) fetal abnormality and damage, unspecified, not applicable or unspecified: Secondary | ICD-10-CM

## 2017-10-04 DIAGNOSIS — O99213 Obesity complicating pregnancy, third trimester: Secondary | ICD-10-CM | POA: Insufficient documentation

## 2017-10-11 NOTE — Patient Instructions (Signed)
AFSHEEN ANTONY  10/11/2017   Your procedure is scheduled on:  10/17/2017  Enter through the Main Entrance of [facility] up the phone at the desk and dial [phone]  Call this number if you have problems the morning of surgery:[phone]  Remember:   Do not eat food:(After Midnight) Desps de medianoche.  Do not drink clear liquids: (After Midnight) Desps de medianoche.  Take these medicines the morning of surgery with A SIP OF WATER: none   Do not wear jewelry, make-up or nail polish.  Do not wear lotions, powders, or perfumes. Do not wear deodorant.  Do not shave 48 hours prior to surgery.  Do not bring valuables to the hospital.  [facility] is not   responsible for any belongings or valuables brought to the hospital.  Contacts, dentures or bridgework may not be worn into surgery.  Leave suitcase in the car. After surgery it may be brought to your room.  For patients admitted to the hospital, checkout time is 11:00 AM the day of              discharge.    N/A   Please read over the following fact sheets that you were given:   Surgical Site Infection Prevention

## 2017-10-12 ENCOUNTER — Encounter: Payer: Medicaid Other | Admitting: Obstetrics and Gynecology

## 2017-10-12 ENCOUNTER — Encounter: Payer: Self-pay | Admitting: Obstetrics and Gynecology

## 2017-10-12 ENCOUNTER — Telehealth: Payer: Self-pay | Admitting: Family Medicine

## 2017-10-12 NOTE — Telephone Encounter (Signed)
Sending No Show letter.

## 2017-10-16 ENCOUNTER — Encounter (HOSPITAL_COMMUNITY)
Admission: RE | Admit: 2017-10-16 | Discharge: 2017-10-16 | Disposition: A | Discharge status: Home / Self Care | Payer: Medicaid Other | Source: Ambulatory Visit | Attending: Family Medicine | Admitting: Family Medicine

## 2017-10-16 LAB — CBC
HCT: 30.7 % — ABNORMAL LOW (ref 36.0–46.0)
Hemoglobin: 10.1 g/dL — ABNORMAL LOW (ref 12.0–15.0)
MCH: 29.5 pg (ref 26.0–34.0)
MCHC: 32.9 g/dL (ref 30.0–36.0)
MCV: 89.8 fL (ref 80.0–100.0)
NRBC: 0 % (ref 0.0–0.2)
Platelets: 327 10*3/uL (ref 150–400)
RBC: 3.42 MIL/uL — AB (ref 3.87–5.11)
RDW: 15.6 % — AB (ref 11.5–15.5)
WBC: 7.4 10*3/uL (ref 4.0–10.5)

## 2017-10-16 LAB — TYPE AND SCREEN
ABO/RH(D): O POS
ANTIBODY SCREEN: NEGATIVE

## 2017-10-17 ENCOUNTER — Inpatient Hospital Stay (HOSPITAL_COMMUNITY): Payer: Medicaid Other | Admitting: Anesthesiology

## 2017-10-17 ENCOUNTER — Encounter (HOSPITAL_COMMUNITY)
Admission: RE | Disposition: A | Discharge status: Home / Self Care | Payer: Self-pay | Source: Home / Self Care | Attending: Family Medicine

## 2017-10-17 ENCOUNTER — Inpatient Hospital Stay (HOSPITAL_COMMUNITY)
Admission: RE | Admit: 2017-10-17 | Discharge: 2017-10-19 | DRG: 788 | Disposition: A | Discharge status: Home / Self Care | Payer: Medicaid Other | Attending: Family Medicine | Admitting: Family Medicine

## 2017-10-17 ENCOUNTER — Encounter (HOSPITAL_COMMUNITY): Payer: Self-pay

## 2017-10-17 DIAGNOSIS — O9902 Anemia complicating childbirth: Secondary | ICD-10-CM | POA: Diagnosis present

## 2017-10-17 DIAGNOSIS — L299 Pruritus, unspecified: Secondary | ICD-10-CM | POA: Diagnosis present

## 2017-10-17 DIAGNOSIS — O99334 Smoking (tobacco) complicating childbirth: Secondary | ICD-10-CM | POA: Diagnosis present

## 2017-10-17 DIAGNOSIS — Z3A39 39 weeks gestation of pregnancy: Secondary | ICD-10-CM | POA: Diagnosis not present

## 2017-10-17 DIAGNOSIS — Z98891 History of uterine scar from previous surgery: Secondary | ICD-10-CM

## 2017-10-17 DIAGNOSIS — D649 Anemia, unspecified: Secondary | ICD-10-CM | POA: Diagnosis present

## 2017-10-17 DIAGNOSIS — Z880 Allergy status to penicillin: Secondary | ICD-10-CM

## 2017-10-17 DIAGNOSIS — O99214 Obesity complicating childbirth: Secondary | ICD-10-CM | POA: Diagnosis present

## 2017-10-17 DIAGNOSIS — O26893 Other specified pregnancy related conditions, third trimester: Secondary | ICD-10-CM | POA: Diagnosis present

## 2017-10-17 DIAGNOSIS — O34211 Maternal care for low transverse scar from previous cesarean delivery: Principal | ICD-10-CM | POA: Diagnosis present

## 2017-10-17 DIAGNOSIS — F17210 Nicotine dependence, cigarettes, uncomplicated: Secondary | ICD-10-CM | POA: Diagnosis present

## 2017-10-17 LAB — RPR: RPR: NONREACTIVE

## 2017-10-17 SURGERY — Surgical Case
Anesthesia: Spinal | Site: Abdomen | Wound class: Clean Contaminated

## 2017-10-17 MED ORDER — MORPHINE SULFATE (PF) 0.5 MG/ML IJ SOLN
INTRAMUSCULAR | Status: DC | PRN
  Administered 2017-10-17: .15 mg via INTRATHECAL

## 2017-10-17 MED ORDER — ONDANSETRON HCL 4 MG/2ML IJ SOLN
INTRAMUSCULAR | Status: DC | PRN
  Administered 2017-10-17: 4 mg via INTRAVENOUS

## 2017-10-17 MED ORDER — ONDANSETRON HCL 4 MG/2ML IJ SOLN: 4.0000 mg | Freq: Once | INTRAMUSCULAR | Status: DC | PRN

## 2017-10-17 MED ORDER — NALOXONE HCL 4 MG/10ML IJ SOLN
1.0000 ug/kg/h | INTRAVENOUS | Status: DC | PRN
  Filled 2017-10-17: qty 5

## 2017-10-17 MED ORDER — MEASLES, MUMPS & RUBELLA VAC ~~LOC~~ INJ
0.5000 mL | INJECTION | Freq: Once | SUBCUTANEOUS | Status: DC
  Filled 2017-10-17: qty 0.5

## 2017-10-17 MED ORDER — ACETAMINOPHEN 325 MG PO TABS
650.0000 mg | ORAL_TABLET | ORAL | Status: DC | PRN
  Administered 2017-10-18 – 2017-10-19 (×2): 650 mg via ORAL
  Filled 2017-10-17 (×2): qty 2

## 2017-10-17 MED ORDER — MEPERIDINE HCL 25 MG/ML IJ SOLN: 6.2500 mg | INTRAMUSCULAR | Status: DC | PRN

## 2017-10-17 MED ORDER — SCOPOLAMINE 1 MG/3DAYS TD PT72
MEDICATED_PATCH | TRANSDERMAL | Status: AC
  Filled 2017-10-17: qty 1

## 2017-10-17 MED ORDER — NALBUPHINE HCL 10 MG/ML IJ SOLN: 5.0000 mg | Freq: Once | INTRAMUSCULAR | Status: DC | PRN

## 2017-10-17 MED ORDER — PRENATAL MULTIVITAMIN CH
1.0000 | ORAL_TABLET | Freq: Every day | ORAL | Status: DC
  Administered 2017-10-18: 1 via ORAL
  Filled 2017-10-17: qty 1

## 2017-10-17 MED ORDER — BUPIVACAINE IN DEXTROSE 0.75-8.25 % IT SOLN
INTRATHECAL | Status: DC | PRN
  Administered 2017-10-17: 1.6 mL via INTRATHECAL

## 2017-10-17 MED ORDER — MORPHINE SULFATE (PF) 0.5 MG/ML IJ SOLN
INTRAMUSCULAR | Status: AC
  Filled 2017-10-17: qty 10

## 2017-10-17 MED ORDER — OXYTOCIN 10 UNIT/ML IJ SOLN
INTRAVENOUS | Status: DC | PRN
  Administered 2017-10-17: 40 [IU] via INTRAVENOUS

## 2017-10-17 MED ORDER — ZOLPIDEM TARTRATE 5 MG PO TABS: 5.0000 mg | ORAL_TABLET | Freq: Every evening | ORAL | Status: DC | PRN

## 2017-10-17 MED ORDER — NALBUPHINE HCL 10 MG/ML IJ SOLN
5.0000 mg | INTRAMUSCULAR | Status: DC | PRN
  Administered 2017-10-17 (×3): 5 mg via INTRAVENOUS
  Filled 2017-10-17: qty 1

## 2017-10-17 MED ORDER — KETOROLAC TROMETHAMINE 30 MG/ML IJ SOLN: 30.0000 mg | Freq: Four times a day (QID) | INTRAMUSCULAR | Status: AC | PRN

## 2017-10-17 MED ORDER — DIPHENHYDRAMINE HCL 50 MG/ML IJ SOLN
INTRAMUSCULAR | Status: AC
  Filled 2017-10-17: qty 1

## 2017-10-17 MED ORDER — NALBUPHINE HCL 10 MG/ML IJ SOLN
5.0000 mg | INTRAMUSCULAR | Status: DC | PRN
  Filled 2017-10-17: qty 1

## 2017-10-17 MED ORDER — SIMETHICONE 80 MG PO CHEW: 80.0000 mg | CHEWABLE_TABLET | ORAL | Status: DC | PRN

## 2017-10-17 MED ORDER — SENNOSIDES-DOCUSATE SODIUM 8.6-50 MG PO TABS
2.0000 | ORAL_TABLET | ORAL | Status: DC
  Administered 2017-10-17 – 2017-10-19 (×2): 2 via ORAL
  Filled 2017-10-17 (×2): qty 2

## 2017-10-17 MED ORDER — KETOROLAC TROMETHAMINE 30 MG/ML IJ SOLN
INTRAMUSCULAR | Status: AC
  Administered 2017-10-17: 30 mg via INTRAMUSCULAR
  Filled 2017-10-17: qty 1

## 2017-10-17 MED ORDER — OXYCODONE HCL 5 MG PO TABS
5.0000 mg | ORAL_TABLET | ORAL | Status: DC | PRN
  Administered 2017-10-18 (×2): 5 mg via ORAL
  Filled 2017-10-17 (×2): qty 1

## 2017-10-17 MED ORDER — SCOPOLAMINE 1 MG/3DAYS TD PT72
MEDICATED_PATCH | TRANSDERMAL | Status: DC | PRN
  Administered 2017-10-17: 1 via TRANSDERMAL

## 2017-10-17 MED ORDER — ACETAMINOPHEN 160 MG/5ML PO SOLN: 325.0000 mg | ORAL | Status: DC | PRN

## 2017-10-17 MED ORDER — SIMETHICONE 80 MG PO CHEW
80.0000 mg | CHEWABLE_TABLET | Freq: Three times a day (TID) | ORAL | Status: DC
  Administered 2017-10-18 – 2017-10-19 (×3): 80 mg via ORAL
  Filled 2017-10-17 (×3): qty 1

## 2017-10-17 MED ORDER — EPHEDRINE SULFATE 50 MG/ML IJ SOLN
INTRAMUSCULAR | Status: DC | PRN
  Administered 2017-10-17: 10 mg via INTRAVENOUS

## 2017-10-17 MED ORDER — WITCH HAZEL-GLYCERIN EX PADS: 1.0000 "application " | MEDICATED_PAD | CUTANEOUS | Status: DC | PRN

## 2017-10-17 MED ORDER — ONDANSETRON HCL 4 MG/2ML IJ SOLN
INTRAMUSCULAR | Status: AC
  Filled 2017-10-17: qty 2

## 2017-10-17 MED ORDER — SODIUM CHLORIDE 0.9 % IR SOLN
Status: DC | PRN
  Administered 2017-10-17: 1000 mL

## 2017-10-17 MED ORDER — DIPHENHYDRAMINE HCL 50 MG/ML IJ SOLN
12.5000 mg | INTRAMUSCULAR | Status: DC | PRN
  Administered 2017-10-17 (×2): 12.5 mg via INTRAVENOUS
  Filled 2017-10-17 (×2): qty 1

## 2017-10-17 MED ORDER — MENTHOL 3 MG MT LOZG: 1.0000 | LOZENGE | OROMUCOSAL | Status: DC | PRN

## 2017-10-17 MED ORDER — OXYTOCIN 10 UNIT/ML IJ SOLN
INTRAMUSCULAR | Status: AC
  Filled 2017-10-17: qty 4

## 2017-10-17 MED ORDER — LACTATED RINGERS IV SOLN
INTRAVENOUS | Status: DC
  Administered 2017-10-17 – 2017-10-18 (×2): via INTRAVENOUS

## 2017-10-17 MED ORDER — SODIUM CHLORIDE 0.9% FLUSH: 3.0000 mL | INTRAVENOUS | Status: DC | PRN

## 2017-10-17 MED ORDER — DIPHENHYDRAMINE HCL 25 MG PO CAPS
25.0000 mg | ORAL_CAPSULE | ORAL | Status: DC | PRN
  Administered 2017-10-18: 25 mg via ORAL
  Filled 2017-10-17: qty 1

## 2017-10-17 MED ORDER — SODIUM CHLORIDE 0.9% FLUSH
INTRAVENOUS | Status: AC
  Filled 2017-10-17: qty 3

## 2017-10-17 MED ORDER — TETANUS-DIPHTH-ACELL PERTUSSIS 5-2.5-18.5 LF-MCG/0.5 IM SUSP: 0.5000 mL | Freq: Once | INTRAMUSCULAR | Status: DC

## 2017-10-17 MED ORDER — PHENYLEPHRINE 8 MG IN D5W 100 ML (0.08MG/ML) PREMIX OPTIME
INJECTION | INTRAVENOUS | Status: DC | PRN
  Administered 2017-10-17: 60 ug/min via INTRAVENOUS

## 2017-10-17 MED ORDER — STERILE WATER FOR IRRIGATION IR SOLN
Status: DC | PRN
  Administered 2017-10-17: 1000 mL

## 2017-10-17 MED ORDER — SIMETHICONE 80 MG PO CHEW
80.0000 mg | CHEWABLE_TABLET | ORAL | Status: DC
  Administered 2017-10-17 – 2017-10-19 (×2): 80 mg via ORAL
  Filled 2017-10-17 (×2): qty 1

## 2017-10-17 MED ORDER — PHENYLEPHRINE 8 MG IN D5W 100 ML (0.08MG/ML) PREMIX OPTIME
INJECTION | INTRAVENOUS | Status: AC
  Filled 2017-10-17: qty 100

## 2017-10-17 MED ORDER — COCONUT OIL OIL: 1.0000 "application " | TOPICAL_OIL | Status: DC | PRN

## 2017-10-17 MED ORDER — NALOXONE HCL 0.4 MG/ML IJ SOLN: 0.4000 mg | INTRAMUSCULAR | Status: DC | PRN

## 2017-10-17 MED ORDER — LACTATED RINGERS IV SOLN
INTRAVENOUS | Status: DC | PRN
  Administered 2017-10-17: 11:00:00 via INTRAVENOUS

## 2017-10-17 MED ORDER — OXYTOCIN 40 UNITS IN LACTATED RINGERS INFUSION - SIMPLE MED: 2.5000 [IU]/h | INTRAVENOUS | Status: AC

## 2017-10-17 MED ORDER — GENTAMICIN SULFATE 40 MG/ML IJ SOLN
INTRAVENOUS | Status: AC
  Administered 2017-10-17: 703.5 mg via INTRAVENOUS
  Filled 2017-10-17: qty 17.5

## 2017-10-17 MED ORDER — LACTATED RINGERS IV SOLN
INTRAVENOUS | Status: DC
  Administered 2017-10-17 (×2): via INTRAVENOUS

## 2017-10-17 MED ORDER — ONDANSETRON HCL 4 MG/2ML IJ SOLN: 4.0000 mg | Freq: Three times a day (TID) | INTRAMUSCULAR | Status: DC | PRN

## 2017-10-17 MED ORDER — NALBUPHINE HCL 10 MG/ML IJ SOLN
INTRAMUSCULAR | Status: AC
  Administered 2017-10-17: 5 mg via INTRAVENOUS
  Filled 2017-10-17: qty 1

## 2017-10-17 MED ORDER — KETOROLAC TROMETHAMINE 30 MG/ML IJ SOLN
30.0000 mg | Freq: Four times a day (QID) | INTRAMUSCULAR | Status: AC | PRN
  Administered 2017-10-17: 30 mg via INTRAMUSCULAR

## 2017-10-17 MED ORDER — DIPHENHYDRAMINE HCL 50 MG/ML IJ SOLN
INTRAMUSCULAR | Status: DC | PRN
  Administered 2017-10-17: 25 mg via INTRAVENOUS

## 2017-10-17 MED ORDER — OXYCODONE HCL 5 MG PO TABS
10.0000 mg | ORAL_TABLET | ORAL | Status: DC | PRN
  Administered 2017-10-19 (×2): 10 mg via ORAL
  Filled 2017-10-17 (×2): qty 2

## 2017-10-17 MED ORDER — SCOPOLAMINE 1 MG/3DAYS TD PT72
1.0000 | MEDICATED_PATCH | Freq: Once | TRANSDERMAL | Status: DC
  Filled 2017-10-17: qty 1

## 2017-10-17 MED ORDER — PHENYLEPHRINE 40 MCG/ML (10ML) SYRINGE FOR IV PUSH (FOR BLOOD PRESSURE SUPPORT)
PREFILLED_SYRINGE | INTRAVENOUS | Status: AC
  Filled 2017-10-17: qty 10

## 2017-10-17 MED ORDER — OXYCODONE HCL 5 MG/5ML PO SOLN: 5.0000 mg | Freq: Once | ORAL | Status: DC | PRN

## 2017-10-17 MED ORDER — DEXAMETHASONE SODIUM PHOSPHATE 10 MG/ML IJ SOLN
INTRAMUSCULAR | Status: DC | PRN
  Administered 2017-10-17: 10 mg via INTRAVENOUS

## 2017-10-17 MED ORDER — IBUPROFEN 600 MG PO TABS
600.0000 mg | ORAL_TABLET | Freq: Four times a day (QID) | ORAL | Status: DC
  Administered 2017-10-17 – 2017-10-19 (×7): 600 mg via ORAL
  Filled 2017-10-17 (×7): qty 1

## 2017-10-17 MED ORDER — PHENYLEPHRINE 40 MCG/ML (10ML) SYRINGE FOR IV PUSH (FOR BLOOD PRESSURE SUPPORT)
PREFILLED_SYRINGE | INTRAVENOUS | Status: DC | PRN
  Administered 2017-10-17: 40 ug via INTRAVENOUS

## 2017-10-17 MED ORDER — FENTANYL CITRATE (PF) 100 MCG/2ML IJ SOLN: 25.0000 ug | INTRAMUSCULAR | Status: DC | PRN

## 2017-10-17 MED ORDER — FENTANYL CITRATE (PF) 100 MCG/2ML IJ SOLN
INTRAMUSCULAR | Status: DC | PRN
  Administered 2017-10-17: 15 ug via INTRATHECAL

## 2017-10-17 MED ORDER — EPHEDRINE 5 MG/ML INJ
INTRAVENOUS | Status: AC
  Filled 2017-10-17: qty 10

## 2017-10-17 MED ORDER — SOD CITRATE-CITRIC ACID 500-334 MG/5ML PO SOLN
30.0000 mL | ORAL | Status: AC
  Administered 2017-10-17: 30 mL via ORAL
  Filled 2017-10-17: qty 15

## 2017-10-17 MED ORDER — FENTANYL CITRATE (PF) 100 MCG/2ML IJ SOLN
INTRAMUSCULAR | Status: AC
  Filled 2017-10-17: qty 2

## 2017-10-17 MED ORDER — ENOXAPARIN SODIUM 80 MG/0.8ML ~~LOC~~ SOLN
70.0000 mg | SUBCUTANEOUS | Status: DC
  Administered 2017-10-18 – 2017-10-19 (×2): 70 mg via SUBCUTANEOUS
  Filled 2017-10-17 (×2): qty 0.8

## 2017-10-17 MED ORDER — DIPHENHYDRAMINE HCL 25 MG PO CAPS: 25.0000 mg | ORAL_CAPSULE | Freq: Four times a day (QID) | ORAL | Status: DC | PRN

## 2017-10-17 MED ORDER — ACETAMINOPHEN 325 MG PO TABS: 325.0000 mg | ORAL_TABLET | ORAL | Status: DC | PRN

## 2017-10-17 MED ORDER — OXYCODONE HCL 5 MG PO TABS: 5.0000 mg | ORAL_TABLET | Freq: Once | ORAL | Status: DC | PRN

## 2017-10-17 MED ORDER — DIBUCAINE 1 % RE OINT: 1.0000 "application " | TOPICAL_OINTMENT | RECTAL | Status: DC | PRN

## 2017-10-17 SURGICAL SUPPLY — 32 items
APL SKNCLS STERI-STRIP NONHPOA (GAUZE/BANDAGES/DRESSINGS) ×1
BENZOIN TINCTURE PRP APPL 2/3 (GAUZE/BANDAGES/DRESSINGS) ×3 IMPLANT
CHLORAPREP W/TINT 26ML (MISCELLANEOUS) ×3 IMPLANT
CLOSURE WOUND 1/2 X4 (GAUZE/BANDAGES/DRESSINGS) ×1
CLOTH BEACON ORANGE TIMEOUT ST (SAFETY) ×3 IMPLANT
DRSG OPSITE POSTOP 4X10 (GAUZE/BANDAGES/DRESSINGS) ×3 IMPLANT
ELECT REM PT RETURN 9FT ADLT (ELECTROSURGICAL) ×3
ELECTRODE REM PT RTRN 9FT ADLT (ELECTROSURGICAL) ×1 IMPLANT
GLOVE BIOGEL PI IND STRL 7.0 (GLOVE) ×3 IMPLANT
GLOVE BIOGEL PI INDICATOR 7.0 (GLOVE) ×6
GLOVE ECLIPSE 6.5 STRL STRAW (GLOVE) ×3 IMPLANT
GOWN STRL REUS W/ TWL LRG LVL3 (GOWN DISPOSABLE) ×2 IMPLANT
GOWN STRL REUS W/TWL LRG LVL3 (GOWN DISPOSABLE) ×6
KIT PREVENA INCISION MGT20CM45 (CANNISTER) ×2 IMPLANT
NEEDLE HYPO 22GX1.5 SAFETY (NEEDLE) ×3 IMPLANT
NS IRRIG 1000ML POUR BTL (IV SOLUTION) ×3 IMPLANT
PAD OB MATERNITY 4.3X12.25 (PERSONAL CARE ITEMS) ×3 IMPLANT
PAD PREP 24X48 CUFFED NSTRL (MISCELLANEOUS) ×3 IMPLANT
RETRACTOR TRAXI PANNICULUS (MISCELLANEOUS) IMPLANT
RETRACTOR WND ALEXIS 25 LRG (MISCELLANEOUS) IMPLANT
RTRCTR WOUND ALEXIS 25CM LRG (MISCELLANEOUS)
SPONGE LAP 18X18 RF (DISPOSABLE) ×9 IMPLANT
STRIP CLOSURE SKIN 1/2X4 (GAUZE/BANDAGES/DRESSINGS) ×2 IMPLANT
SUT PLAIN 2 0 XLH (SUTURE) ×3 IMPLANT
SUT VIC AB 0 CT1 36 (SUTURE) ×6 IMPLANT
SUT VIC AB 2-0 CT1 27 (SUTURE) ×3
SUT VIC AB 2-0 CT1 TAPERPNT 27 (SUTURE) ×1 IMPLANT
SUT VIC AB 4-0 KS 27 (SUTURE) ×3 IMPLANT
SYR CONTROL 10ML LL (SYRINGE) ×3 IMPLANT
TOWEL OR 17X24 6PK STRL BLUE (TOWEL DISPOSABLE) ×9 IMPLANT
TRAXI PANNICULUS RETRACTOR (MISCELLANEOUS) ×2
TRAY FOLEY CATH SILVER 16FR (SET/KITS/TRAYS/PACK) ×3 IMPLANT

## 2017-10-17 NOTE — Anesthesia Procedure Notes (Signed)
Spinal  Patient location during procedure: OR Start time: 10/17/2017 10:45 AM End time: 10/17/2017 10:50 AM Staffing Anesthesiologist: Janeece Riggers, MD Preanesthetic Checklist Completed: patient identified, site marked, surgical consent, pre-op evaluation, timeout performed, IV checked, risks and benefits discussed and monitors and equipment checked Spinal Block Patient position: sitting Prep: DuraPrep Patient monitoring: heart rate, cardiac monitor, continuous pulse ox and blood pressure Approach: midline Location: L2-3 Injection technique: single-shot Needle Needle type: Sprotte  Needle gauge: 24 G Needle length: 9 cm Assessment Sensory level: T4

## 2017-10-17 NOTE — H&P (Signed)
Obstetric Preoperative History and Physical  Tina Riggs is a 37 y.o. G2P1001 with IUP at [redacted]w[redacted]d presenting for presenting for scheduled cesarean section.  No acute concerns.   Prenatal Course Source of Care: [facility]  with onset of care at 11 weeks Pregnancy complications or risks: Patient Active Problem List   Diagnosis Date Noted  . History of cesarean section 05/02/2017  . Abdominal pain 04/20/2017  . Morbid obesity with BMI of 45.0-49.9, adult ([place]) 04/11/2017  . Advanced maternal age in multigravida 04/11/2017  . Supervision of other high risk pregnancy, antepartum 04/04/2017  . Obesity affecting pregnancy, antepartum 04/04/2017  . 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 Diverticulitis of colon with perforation 04/13/2014  . Tobacco use 04/13/2014   She plans to bottle feed She desires Nexplanon for postpartum contraception.   Prenatal labs and studies: ABO, Rh: --/--/O POS (10/15 0935) Antibody: NEG (10/15 0935) Rubella: 1.53 (04/03 1646) RPR: Non Reactive (10/15 0935)  HBsAg: Negative (04/03 1646)  HIV: Non Reactive (08/02 0830)  GBS:  not collected 2 hr Glucola  wnl Genetic screening normal Anatomy US normal  Prenatal Transfer Tool  Maternal Diabetes: No Genetic Screening: Normal Maternal Ultrasounds/Referrals: Normal Fetal Ultrasounds or other Referrals:  None Maternal Substance Abuse:  No Significant Maternal Medications:  None Significant Maternal Lab Results: Lab values include: Other: GBS missed, no history of GBS positivity and not preterm.   Past Medical History:  Diagnosis Date  . Diverticulitis    hospitalized 04/13/2014; hospitalized 04/29/2014  . HPV in female   . Vaginal Pap smear, abnormal     Past Surgical History:  Procedure Laterality Date  . CESAREAN SECTION  02/2006  . COLON SURGERY    . COLONOSCOPY N/A 06/11/2015   Procedure: COLONOSCOPY;  Surgeon: Leighton Ruff, MD;  Location: [facility];  Service: General;  Laterality: N/A;  . COLOSTOMY N/A  05/18/2014   Procedure: COLOSTOMY;  Surgeon: Georganna Skeans, MD;  Location: [place];  Service: General;  Laterality: N/A;  . COLOSTOMY REVERSAL  06/2015   robotic assisted colostomy reversal with LOA/notes 06/16/2015  . COLOSTOMY REVISION N/A 05/18/2014   Procedure: COLON RESECTION SIGMOID;  Surgeon: Georganna Skeans, MD;  Location: [place];  Service: General;  Laterality: N/A;  . COLPOSCOPY  Sep 2012  . LAPAROSCOPIC LYSIS OF ADHESIONS N/A 05/18/2014   Procedure: LAPAROSCOPIC MOBILIZATION OF SPLENIC FLEXURE;  Surgeon: Georganna Skeans, MD;  Location: [place];  Service: General;  Laterality: N/A;    OB History  Gravida Para Term Preterm AB Living  2 1 1     1   SAB TAB Ectopic Multiple Live Births          1    # Outcome Date GA Lbr Len/2nd Weight Sex Delivery Anes PTL Lv  2 Current           1 Term 2008 [redacted]w[redacted]d  3204 g M CS-LTranv EPI  LIV     Birth Comments: c/s due to something about potassium    Social History   Socioeconomic History  . Marital status: Single    Spouse name: Not on file  . Number of children: Not on file  . Years of education: Not on file  . Highest education level: Not on file  Occupational History  . Not on file  Social Needs  . Financial resource strain: Not on file  . Food insecurity:    Worry: Not on file    Inability: Not on file  . Transportation needs:    Medical:  No    Non-medical: Not on file  Tobacco Use  . Smoking status: Current Every Day Smoker    Packs/day: 0.50    Years: 18.00    Pack years: 9.00    Types: Cigarettes  . Smokeless tobacco: Never Used  Substance and Sexual Activity  . Alcohol use: Yes    Alcohol/week: 0.0 standard drinks    Comment: wine  . Drug use: No  . Sexual activity: Not Currently    Birth control/protection: None  Lifestyle  . Physical activity:    Days per week: Not on file    Minutes per session: Not on file  . Stress: Only a little  Relationships  . Social connections:    Talks on phone: Not on file    Gets  together: Not on file    Attends religious service: Not on file    Active member of club or organization: Not on file    Attends meetings of clubs or organizations: Not on file    Relationship status: Not on file  Other Topics Concern  . Not on file  Social History Narrative  . Not on file    Family History  Problem Relation Age of Onset  . HIV Mother   . Hypertension Mother     Medications Prior to Admission  Medication Sig Dispense Refill Last Dose  . famotidine (PEPCID) 20 MG tablet Take 1 tablet (20 mg total) by mouth daily. (Patient not taking: Reported on 08/24/2017) 30 tablet 1 Not Taking  . Prenatal Vit-Fe Fumarate-FA (PNV PRENATAL PLUS MULTIVITAMIN) 27-1 MG TABS TAKE 1 TABLET BY MOUTH DAILY. 30 tablet 12 10/02/2017 at Unknown time    Allergies  Allergen Reactions  . Robaxin [Methocarbamol] Swelling and Other (See Comments)    Bottom lip swelled  . Penicillins Itching, Rash and Other (See Comments)    Has patient had a PCN reaction causing immediate rash, facial/tongue/throat swelling, SOB or lightheadedness with hypotension: yes Has patient had a PCN reaction causing severe rash involving mucus membranes or skin necrosis: no Has patient had a PCN reaction that required hospitalization no Has patient had a PCN reaction occurring within the last 10 years: no If all of the above answers are "NO", then may proceed with Cephalosporin use. Pt states she has tolerated Amoxicillin in the past     Review of Systems: Negative except for what is mentioned in HPI.  Physical Exam: BP 129/78   Pulse 83   Temp 98.1 F (36.7 C) (Oral)   Resp 18   Ht 5' 6.5" (1.689 m)   Wt (!) 137.6 kg   LMP 01/17/2017   BMI 48.22 kg/m  FHR by Doppler: 145 bpm CONSTITUTIONAL: Well-developed, well-nourished female in no acute distress.  HENT:  Normocephalic, atraumatic, External right and left ear normal. Oropharynx is clear and moist EYES: Conjunctivae and EOM are normal. Pupils are equal,  round, and reactive to light. No scleral icterus.  NECK: Normal range of motion, supple, no masses SKIN: Skin is warm and dry. No rash noted. Not diaphoretic. No erythema. No pallor. [place] of [place]: Alert and oriented to person, place, and time. Normal reflexes, muscle tone coordination. No cranial nerve deficit noted. PSYCHIATRIC: Normal mood and affect. Normal behavior. Normal judgment and thought content. CARDIOVASCULAR: Normal heart rate noted, regular rhythm RESPIRATORY: Effort and breath sounds normal, no problems with respiration noted ABDOMEN: Soft, nontender, nondistended, gravid. Well-healed Pfannenstiel incision. PELVIC: Deferred MUSCULOSKELETAL: Normal range of motion. No edema and no tenderness. 2+ distal pulses.  Pertinent Labs/Studies:   Results for orders placed or performed during the hospital encounter of 10/16/17 (from the past 72 hour(s))  CBC     Status: Abnormal   Collection Time: 10/16/17  9:35 AM  Result Value Ref Range   WBC 7.4 4.0 - 10.5 K/uL   RBC 3.42 (L) 3.87 - 5.11 MIL/uL   Hemoglobin 10.1 (L) 12.0 - 15.0 g/dL   HCT 30.7 (L) 36.0 - 46.0 %   MCV 89.8 80.0 - 100.0 fL   MCH 29.5 26.0 - 34.0 pg   MCHC 32.9 30.0 - 36.0 g/dL   RDW 15.6 (H) 11.5 - 15.5 %   Platelets 327 150 - 400 K/uL   nRBC 0.0 0.0 - 0.2 %    Comment: Performed at [facility], [address] [place], [place] [address]  RPR     Status: None   Collection Time: 10/16/17  9:35 AM  Result Value Ref Range   RPR Ser Ql Non Reactive Non Reactive    Comment: (NOTE) Performed At: [facility] [place], [place] [id number] Rush Farmer MD [id number]   Type and screen [place]     Status: None   Collection Time: 10/16/17  9:35 AM  Result Value Ref Range   ABO/RH(D) O POS    Antibody Screen NEG    Sample Expiration      10/19/2017 Performed at [facility], [address], [place], [place] [address]     Assessment and Plan :ERIELLE GAWRONSKI is a 37 y.o. G2P1001 at [redacted]w[redacted]d being admitted being admitted for scheduled cesarean section. The risks of cesarean section discussed with the patient included but were not limited to: bleeding which may require transfusion or reoperation; infection which may require antibiotics; injury to bowel, bladder, ureters or other surrounding organs; injury to the fetus; need for additional procedures including hysterectomy in the event of a life-threatening hemorrhage; placental abnormalities wth subsequent pregnancies, incisional problems, thromboembolic phenomenon and other postoperative/anesthesia complications. The patient concurred with the proposed plan, giving informed written consent for the procedure. Patient has been NPO since last night she will remain NPO for procedure. Anesthesia and OR aware. Preoperative prophylactic antibiotics (gentamycin, clindamycin due to PCN allergy and SCDs ordered on call to the OR. To OR when ready.  Plan for PICO dressing    Caren Macadam, MD, MPH, ABFM Attending [place] for [facility]

## 2017-10-17 NOTE — Op Note (Addendum)
Cesarean Section Operative Report  PATIENT: Tina Riggs  PROCEDURE DATE: 10/17/2017  PREOPERATIVE DIAGNOSES: Intrauterine pregnancy at [redacted]w[redacted]d weeks gestation; patient declines vag del attempt  POSTOPERATIVE DIAGNOSES: The same  PROCEDURE: Repeat Low Transverse Cesarean Section  SURGEON:   Surgeon(s) and Role:    Caren Macadam, MD - Primary    * Nicolette Bang, DO - Fellow     * Constant, Peggy MD- Surgical assistant  An experienced assistant was required given the standard of surgical care given the complexity of the case.  This assistant was needed for exposure, dissection, suctioning, retraction, instrument exchange, assisting with delivery with administration of fundal pressure, and for overall help during the procedure. Patient with multiple abdominal surgeries due to diverticular disease including ostomy placement and s/p takedown procedure  INDICATIONS: Tina Riggs is a 37 y.o. G2P1001 at [redacted]w[redacted]d here for cesarean section secondary to the indications listed under preoperative diagnoses; please see preoperative note for further details.  The risks of cesarean section were discussed with the patient including but were not limited to: bleeding which may require transfusion or reoperation; infection which may require antibiotics; injury to bowel, bladder, ureters or other surrounding organs; injury to the fetus; need for additional procedures including hysterectomy in the event of a life-threatening hemorrhage; placental abnormalities wth subsequent pregnancies, incisional problems, thromboembolic phenomenon and other postoperative/anesthesia complications.   The patient concurred with the proposed plan, giving informed written consent for the procedure.    FINDINGS:  Viable female infant in cephalic presentation. Delivered with help of vacuum due to inability to provide significant expulsive force due to habitus.  Apgars 9 and 9.  Clear amniotic fluid.  Intact placenta,  three vessel cord.  Normal uterus, fallopian tubes and ovaries bilaterally. Rectus muscles densely adhered to abdominal wall but uterus without significant adhesions.   ANESTHESIA: Spinal INTRAVENOUS FLUIDS:2300 mL  ESTIMATED BLOOD LOSS: 431 mL URINE OUTPUT:  100 ml SPECIMENS: Placenta sent to L&D COMPLICATIONS: None immediate  PROCEDURE IN DETAIL:  The patient preoperatively received intravenous antibiotics and had sequential compression devices applied to her lower extremities.  She was then taken to the operating room where spinal anesthesia was administered and was found to be adequate. She was then placed in a dorsal supine position with a leftward tilt, and prepped and draped in a sterile manner.  A foley catheter was placed into her bladder and attached to constant gravity.    After an adequate timeout was performed, a Pfannenstiel skin incision was made with scalpel and carried through to the underlying layer of fascia. The fascia was incised in the midline, and this incision was extended bilaterally using the Mayo scissors.  Kocher clamps were applied to the superior aspect of the fascial incision and the underlying rectus muscles were dissected off bluntly.  A similar process was carried out on the inferior aspect of the fascial incision. The rectus muscles were separated in the midline bluntly and the peritoneum was entered bluntly. Attention was turned to the lower uterine segment where a low transverse hysterotomy was made with a scalpel and extended bilaterally bluntly.  The infant was successfully delivered using a vacuum, the cord was clamped and cut after one minute, and the infant was handed over to the awaiting neonatology team. Uterine massage was then administered, and the placenta delivered intact with a three-vessel cord. The uterus was then cleared of clots and debris.  The hysterotomy was closed with 0 Vicryl in a running locked fashion, and  an imbricating layer was also placed  with 0 Vicryl.  Figure-of-eight 0 Vicryl serosal stitches were placed to help with hemostasis.  The pelvis was cleared of all clot and debris. Hemostasis was confirmed on all surfaces.  The fascia was then closed using 0 Vicryl in a running fashion.  The subcutaneous layer was irrigated, then reapproximated with 2-0 plain gut interrupted stitches.  The skin was closed with a 4-0 Vicryl subcuticular stitch.   The patient tolerated the procedure well. Sponge, lap, instrument and needle counts were correct x 3.  She was taken to the recovery room in stable condition.   An experienced assistant was required given the standard of surgical care given the complexity of the case.  This assistant was needed for exposure, dissection, suctioning, retraction, instrument exchange, assisting with delivery with administration of fundal pressure, and for overall help during the procedure.   Maternal Disposition: PACU - hemodynamically stable.   Infant Disposition: stable   Phill Myron, D.O. OB Fellow  10/17/2017, 12:19 PM   Attestation of Attending Supervision of OB Fellow: Evaluation and management procedures were performed by the Family Medicine OB Fellow under my supervision.  I have reviewed the Fellow's note and chart, and I agree with the documentation. I was present and actively involved the entirety of the case.   Caren Macadam, MD, MPH, ABFM Attending [place] for [facility]

## 2017-10-17 NOTE — Lactation Note (Signed)
This note was copied from a baby's chart. Lactation Consultation Note  Patient Name: Tina Riggs [id number] Date: 10/17/2017 Reason for consult: Initial assessment Mom states she has changed her mind and plans to only formula feed her baby.  Will inform us if she decides to put baby to breast.  Maternal Data    Feeding Feeding Type: Bottle Fed - Formula  LATCH Score Latch: Repeated attempts needed to sustain latch, nipple held in mouth throughout feeding, stimulation needed to elicit sucking reflex.  Audible Swallowing: A few with stimulation  Type of Nipple: Everted at rest and after stimulation  Comfort (Breast/Nipple): Soft / non-tender  Hold (Positioning): Assistance needed to correctly position infant at breast and maintain latch.  LATCH Score: 7  Interventions Interventions: Skin to skin;Assisted with latch;Support pillows  Lactation Tools Discussed/Used     Consult Status Consult Status: Complete    [name] 10/17/2017, 4:13 PM

## 2017-10-17 NOTE — Anesthesia Postprocedure Evaluation (Signed)
Anesthesia Post Note  Patient: Tina Riggs  Procedure(s) Performed: REPEAT CESAREAN SECTION (N/A Abdomen)     Patient location during evaluation: PACU Anesthesia Type: Spinal Level of consciousness: oriented and awake and alert Pain management: pain level controlled Vital Signs Assessment: post-procedure vital signs reviewed and stable Respiratory status: spontaneous breathing, respiratory function stable and patient connected to nasal cannula oxygen Cardiovascular status: blood pressure returned to baseline and stable Postop Assessment: no headache, no backache and no apparent nausea or vomiting Anesthetic complications: no    Last Vitals:  Vitals:   10/17/17 1559 10/17/17 1732  BP: 136/71 133/72  Pulse: 70 88  Resp:    Temp: 36.9 C 36.8 C  SpO2: 99% 97%    Last Pain:  Vitals:   10/17/17 1732  TempSrc: Oral  PainSc:    Pain Goal:                 Bretta Fees

## 2017-10-17 NOTE — Progress Notes (Signed)
Called into room by patient, patient standing by side of bed. Orthostatics not done because patient got up on her own. Patient reminded that this was an unsafe situation and she should not be getting up on her own. Further upset that the room was "filthy" and "unsafe" for baby, baby was in the Nursery at this time so mom could rest. Bed changed and patient assisted to wash her hands in the bed. Reminded again to not get up without assistance. States understanding.

## 2017-10-17 NOTE — Transfer of Care (Signed)
Immediate Anesthesia Transfer of Care Note  Patient: [name]  Procedure(s) Performed: REPEAT CESAREAN SECTION (N/A Abdomen)  Patient Location: PACU  Anesthesia Type:Spinal  Level of Consciousness: awake, alert  and oriented  Airway & Oxygen Therapy: Patient Spontanous Breathing  Post-op Assessment: Report given to RN and Post -op Vital signs reviewed and stable  Post vital signs: Reviewed and stable  Last Vitals:  Vitals Value Taken Time  BP    Temp    Pulse    Resp    SpO2      Last Pain:  Vitals:   10/17/17 0821  TempSrc:   PainSc: 0-No pain         Complications: No apparent anesthesia complications

## 2017-10-17 NOTE — Progress Notes (Signed)
Called Anesthesiologist due to patient having constant severe itching and being agitated and irritable.  Was given the OK to give another dose of 5mg  Nubain prior to 4 hours.

## 2017-10-17 NOTE — Anesthesia Preprocedure Evaluation (Addendum)
Anesthesia Evaluation  Patient identified by MRN, date of birth, ID band Patient awake    Reviewed: Allergy & Precautions, NPO status , Patient's Chart, lab work & pertinent test results  History of Anesthesia Complications Negative for: history of anesthetic complications  Airway Mallampati: II  TM Distance: >3 FB Neck ROM: Full    Dental  (+) Dental Advisory Given, Teeth Intact   Pulmonary Current Smoker,    breath sounds clear to auscultation       Cardiovascular (-) anginanegative cardio ROS   Rhythm:Regular Rate:Normal     Neuro/Psych negative neurological ROS  negative psych ROS   GI/Hepatic Neg liver ROS, diverticulitis   Endo/Other  Morbid obesityBMI 40  Renal/GU negative Renal ROS  negative genitourinary   Musculoskeletal negative musculoskeletal ROS (+)   Abdominal (+) + obese,   Peds negative pediatric ROS (+)  Hematology negative hematology ROS (+) 13/39   Anesthesia Other Findings   Reproductive/Obstetrics (+) Pregnancy                            Anesthesia Physical  Anesthesia Plan  ASA: III  Anesthesia Plan: Spinal   Post-op Pain Management:    Induction: Intravenous  PONV Risk Score and Plan: 2 and Ondansetron, Treatment may vary due to age or medical condition and Scopolamine patch - Pre-op  Airway Management Planned: Nasal Cannula, Natural Airway and Mask  Additional Equipment:   Intra-op Plan:   Post-operative Plan: Extubation in OR  Informed Consent: I have reviewed the patients History and Physical, chart, labs and discussed the procedure including the risks, benefits and alternatives for the proposed anesthesia with the patient or authorized representative who has indicated his/her understanding and acceptance.     Plan Discussed with: CRNA, Anesthesiologist and Surgeon  Anesthesia Plan Comments: (  )           Anesthesia Evaluation  Patient identified by MRN, date of birth, ID band Patient awake              Anesthesia Quick Evaluation

## 2017-10-18 LAB — CBC
HCT: 24.9 % — ABNORMAL LOW (ref 36.0–46.0)
Hemoglobin: 8.2 g/dL — ABNORMAL LOW (ref 12.0–15.0)
MCH: 29.5 pg (ref 26.0–34.0)
MCHC: 32.9 g/dL (ref 30.0–36.0)
MCV: 89.6 fL (ref 80.0–100.0)
NRBC: 0 % (ref 0.0–0.2)
Platelets: 293 10*3/uL (ref 150–400)
RBC: 2.78 MIL/uL — ABNORMAL LOW (ref 3.87–5.11)
RDW: 15.2 % (ref 11.5–15.5)
WBC: 12.3 10*3/uL — AB (ref 4.0–10.5)

## 2017-10-18 LAB — CREATININE, SERUM
CREATININE: 0.53 mg/dL (ref 0.44–1.00)
GFR calc Af Amer: >60 mL/min (ref >60)
GFR calc non Af Amer: >60 mL/min (ref >60)

## 2017-10-18 MED ORDER — FERROUS SULFATE 325 (65 FE) MG PO TABS
325.0000 mg | ORAL_TABLET | Freq: Two times a day (BID) | ORAL | Status: DC
  Administered 2017-10-18 – 2017-10-19 (×2): 325 mg via ORAL
  Filled 2017-10-18 (×2): qty 1

## 2017-10-18 NOTE — Progress Notes (Addendum)
POSTPARTUM PROGRESS NOTE  Post Op Day 1  Subjective:  Tina Riggs is a 37 y.o. O7H2197 s/p rLTCS at [redacted]w[redacted]d.  She reports she is doing well. No acute events overnight. She denies any problems with ambulating, voiding or po intake. Endorses flatus, no BM yet. Denies nausea or vomiting.  Pain is well controlled.  Lochia is moderate, improving since surgery. Endorses some pruritis, improved with benadryl.   Objective: Blood pressure 112/63, pulse 65, temperature 98.6 F (37 C), temperature source Oral, resp. rate 18, height 5' 6.5" (1.689 m), weight (!) 137.6 kg, last menstrual period 01/17/2017, SpO2 100 %.  Physical Exam:  General: alert, cooperative and no distress Chest: no respiratory distress Heart:regular rate, distal pulses intact Abdomen: soft, nontender,  Uterine Fundus: firm, appropriately tender DVT Evaluation: No calf swelling or tenderness Extremities: No edema Skin: warm, dry, dressing intact with prevena in place   Recent Labs    10/16/17 0935 10/18/17 0545  HGB 10.1* 8.2*  HCT 30.7* 24.9*    Assessment/Plan: Tina Riggs is a 37 y.o. G2P2002 s/p rLTCS at [redacted]w[redacted]d.    POD#1 - Doing well  Routine postpartum care  Anemia: Hgb 8.2, from 10.1 Asymptomatic.  -Start ferrous sulfate 325mg  BID   Contraception: Desires nexplanon  Feeding: Bottle  Dispo: Plan for discharge likely on PPD#3.   LOS: 1 day   Darrelyn Hillock, D.O. Family Medicine PGY-1  10/18/2017, 7:59 AM    I confirm that I have verified the information documented in the resident's note and that I have also personally reperformed the physical exam and all medical decision making activities.    -Patient relaxing in room; plans to begin walking once her aunt arrives to help with baby.  -Pressure dressing appears saturated, although Provena is now draining appropriately after not working overnight.  -Will ask DO or MD to assess dressing; otherwise continue routine PP care.   Maye Hides

## 2017-10-18 NOTE — Plan of Care (Signed)
Progressing slowly. Plan and actions must be explained several times and discussed at length.

## 2017-10-18 NOTE — Progress Notes (Signed)
CLINICAL SOCIAL WORK MATERNAL/CHILD NOTE  Patient Details  Name: Tina Riggs MRN: [id number] Date of Birth: 10/17/2015  Date:  10/18/2017  Clinical Social Worker Initiating Note:  Kingsley Spittle LCSW Date/Time: Initiated:  10/18/17/1040     Child's Name:      Biological Parents:  Mother   Need for Interpreter:  None   Reason for Referral:  Current Substance Use/Substance Use During Pregnancy    Address:  [address] [place] [address]    Phone number:  [phone] (home)     Additional phone number: N/a   Household Members/Support Persons (HM/SP):   Household Member/Support Person 1   HM/SP Name Relationship DOB or Age  HM/SP -85 Tina Riggs  Son 68 years old   HM/SP -2        HM/SP -3        HM/SP -4        HM/SP -5        HM/SP -6        HM/SP -7        HM/SP -8          Natural Supports (not living in the home):      Professional Supports:     Employment: Part-time   Type of Work: Designer, multimedia support for [employer]    Education:      Homebound arranged:    Museum/gallery curator Resources:  [employer]   Other Resources:  [employer], Physicist, medical    Cultural/Religious Considerations Which May Impact Care:  N/a   Strengths:  Ability to meet basic needs , Home prepared for child , Pediatrician chosen   Psychotropic Medications:         Pediatrician:    [place]  Pediatrician List:   [facility]      Pediatrician Fax Number:    Risk Factors/Current Problems:  Substance Use (Once at 7/8 months pregnant)   Cognitive State:  Alert    Mood/Affect:  Comfortable , Interested , Happy    CSW Assessment: CSW met with MOB via bedside- MOB was appropriate during conversation however seemed frustrated stating "ive had so many people in my room this morning". Babies name is Tina Riggs and will be MOB's second child. MOB currently lives alone  with 4 year old son, Tina Riggs.  MOB voices many supports in the area including siblings, extended family members, and friends. 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 FOB is currently not involved and MOB did not provide any detail to this. MOB voiced having an easy delivery/ pregnancy however did have some anxiety during her pregnancy- stating she had "family drama" however would not elaborate much on this. MOB did state the drama was resolved and she was no longer having anxiety regarding it.   MOB states her house is prepared for baby and has been in contact with her representative with [employer]- she just needs to notify them that Tina Riggs has been delivered. 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 In the event the cord screen is positive, CSW will make a CPS report at that time. MOB voiced understanding and stated multiple times  that he (baby) is good and so is she. MOB was open about her use of cocaine during pregnancy- stating she only used it one time due to being stressed out. MOB stated she was "glad" she was pregnant at the time because she would have continued to use substances that were bad for her. MOB voiced feeling better at this time and having no concerns. MOB completed the [facility] and scored a 6.   CSW will continue to monitor cord screen and complete CPS report in the event cord screen is positive.    CSW Plan/Description:  No Further Intervention Required/No Barriers to Discharge, CSW Will Continue to Monitor Umbilical Cord Tissue Drug Screen Results and Make Report if Delila Spence, LCSW 10/18/2017, 11:41 AM

## 2017-10-19 ENCOUNTER — Encounter: Payer: Medicaid Other | Admitting: Obstetrics & Gynecology

## 2017-10-19 ENCOUNTER — Encounter (HOSPITAL_COMMUNITY): Payer: Self-pay | Admitting: *Deleted

## 2017-10-19 MED ORDER — FERROUS SULFATE 325 (65 FE) MG PO TABS: 325.0000 mg | ORAL_TABLET | Freq: Two times a day (BID) | ORAL | 1 refills | Status: DC

## 2017-10-19 MED ORDER — OXYCODONE HCL 5 MG PO TABS: 5.0000 mg | ORAL_TABLET | Freq: Four times a day (QID) | ORAL | 0 refills | Status: AC | PRN

## 2017-10-19 MED ORDER — SENNOSIDES-DOCUSATE SODIUM 8.6-50 MG PO TABS: 2.0000 | ORAL_TABLET | ORAL | 0 refills | Status: DC

## 2017-10-19 MED ORDER — IBUPROFEN 600 MG PO TABS: 600.0000 mg | ORAL_TABLET | Freq: Four times a day (QID) | ORAL | 0 refills | Status: DC | PRN

## 2017-10-19 NOTE — Discharge Summary (Signed)
OB Discharge Summary     Patient Name: Tina Riggs DOB: November 09, 1980 MRN: [id number]  Date of admission: 10/17/2017 Delivering MD: Caren Macadam   Date of discharge: 10/19/2017  Admitting diagnosis: RCS Intrauterine pregnancy: [redacted]w[redacted]d     Secondary diagnosis:  Active Problems:   Status post repeat low transverse cesarean section  Additional problems: Acute on chronic normocytic anemia (Asymptomatic, on ferrous sulfate BID), history of diverticulitis with colon perforation s/p repair, Advanced maternal age, obesity (BMI 61)      Discharge diagnosis: Term Pregnancy Delivered and Anemia                                                                                                Post partum procedures:None  Augmentation: None   Complications: None  Hospital course:  Sceduled C/S   37 y.o. yo G2P1001 at [redacted]w[redacted]d was admitted to the hospital 10/17/2017 for scheduled cesarean section with the following indication:Elective Repeat.  Membrane Rupture Time/Date: 11:23 AM ,10/17/2017   Patient delivered a Viable infant.10/17/2017  Details of operation can be found in separate operative note.  Pateint had an uncomplicated postpartum course.  She is ambulating, tolerating a regular diet, passing flatus, and urinating well. Patient is discharged home in stable condition on  10/19/17         Physical exam  Vitals:   10/18/17 0528 10/18/17 0930 10/18/17 1329 10/19/17 0019  BP: 112/63 104/70 (!) 124/54 122/70  Pulse: 65 71 74 71  Resp: 18  18   Temp: 98.6 F (37 C) 98 F (36.7 C) 98.1 F (36.7 C) 98.2 F (36.8 C)  TempSrc: Oral Oral Oral Oral  SpO2: 100% 100%  100%  Weight:      Height:       General: alert, cooperative and no distress Lochia: appropriate Uterine Fundus: firm Incision: Dressing is clean, dry, and intact, Prevena in place  DVT Evaluation: No evidence of DVT seen on physical exam. No significant calf/ankle edema. Labs: Lab Results  Component Value Date    WBC 12.3 (H) 10/18/2017   HGB 8.2 (L) 10/18/2017   HCT 24.9 (L) 10/18/2017   MCV 89.6 10/18/2017   PLT 293 10/18/2017   CMP Latest Ref Rng & Units 10/18/2017  Glucose 70 - 99 mg/dL -  BUN 6 - 20 mg/dL -  Creatinine 0.44 - 1.00 mg/dL 0.53  Sodium 135 - 145 mmol/L -  Potassium 3.5 - 5.1 mmol/L -  Chloride 98 - 111 mmol/L -  CO2 22 - 32 mmol/L -  Calcium 8.9 - 10.3 mg/dL -  Total Protein 6.5 - 8.1 g/dL -  Total Bilirubin 0.3 - 1.2 mg/dL -  Alkaline Phos 38 - 126 U/L -  AST 15 - 41 U/L -  ALT 0 - 44 U/L -    Discharge instruction: per After Visit Summary and "Baby and Me Booklet".  After visit meds:  Allergies as of 10/19/2017      Reactions   Robaxin [methocarbamol] Swelling, Other (See Comments)   Bottom lip swelled   Penicillins Itching, Rash, Other (See Comments)   Has  patient had a PCN reaction causing immediate rash, facial/tongue/throat swelling, SOB or lightheadedness with hypotension: yes Has patient had a PCN reaction causing severe rash involving mucus membranes or skin necrosis: no Has patient had a PCN reaction that required hospitalization no Has patient had a PCN reaction occurring within the last 10 years: no If all of the above answers are "NO", then may proceed with Cephalosporin use. Pt states she has tolerated Amoxicillin in the past      Medication List    STOP taking these medications   famotidine 20 MG tablet Commonly known as:  PEPCID     TAKE these medications   ferrous sulfate 325 (65 FE) MG tablet Take 1 tablet (325 mg total) by mouth 2 (two) times daily with a meal.   ibuprofen 600 MG tablet Commonly known as:  ADVIL,MOTRIN Take 1 tablet (600 mg total) by mouth every 6 (six) hours as needed for mild pain or moderate pain.   oxyCODONE 5 MG immediate release tablet Commonly known as:  Oxy IR/ROXICODONE Take 1 tablet (5 mg total) by mouth every 6 (six) hours as needed for up to 5 days for severe pain or breakthrough pain (pain scale  4-7).   PNV PRENATAL PLUS MULTIVITAMIN 27-1 MG Tabs TAKE 1 TABLET BY MOUTH DAILY.   senna-docusate 8.6-50 MG tablet Commonly known as:  Senokot-S Take 2 tablets by mouth daily. Start taking on:  10/20/2017            Discharge Care Instructions  (From admission, onward)         Start     Ordered   10/19/17 0000  Discharge wound care:    Comments:  May take shower with dressing on.   10/19/17 0857          Diet: routine diet  Activity: Advance as tolerated. Pelvic rest for 6 weeks.   Outpatient follow up:4 weeks, 1 week f/u for incision  Follow up Appt: Future Appointments  Date Time Provider   10/24/2017  9:35 AM [place]  10/31/2017 10:15 AM [place]  11/14/2017  2:15 PM Anyanwu, Sallyanne Havers, MD [facility]   Follow up Visit:No follow-ups on file.  Postpartum contraception: IUD  Newborn Data: Live born female  Birth Weight: 7 lb 4.6 oz (3306 g) APGAR: 9, 9  Newborn Delivery   Birth date/time:  10/17/2017 11:25:00 Delivery type:  C-Section, Vacuum Assisted Trial of labor:  No C-section categorization:  Repeat     Baby Feeding: Bottle Disposition:home with mother   10/19/2017 Patriciaann Clan, DO  Family Medicine PGY-1

## 2017-10-24 ENCOUNTER — Ambulatory Visit: Payer: Medicaid Other

## 2017-10-26 NOTE — Progress Notes (Signed)
CSW notified [facility] CPS of positive cord drug screen.   Kingsley Spittle, [place]  [phone]

## 2017-10-29 ENCOUNTER — Ambulatory Visit: Payer: Medicaid Other

## 2017-10-29 ENCOUNTER — Telehealth: Payer: Self-pay | Admitting: Obstetrics and Gynecology

## 2017-10-29 NOTE — Telephone Encounter (Signed)
Patient called to check on an appointment for her son. She sounded frantic because she said her son was suppose to get checked for his joints. She said she received a missed appointment message on a joint visit for her son. I explained to her it was not for her son, but for her. I informed her she missed her visit to see the [place], and it was a joint visit to also get her Prevena wound vac removed. She stated it was no longer on, and she has a small opening. I made her an appointment to let the nurse make sure it was not infected.

## 2017-10-31 ENCOUNTER — Ambulatory Visit: Payer: Medicaid Other

## 2017-11-01 ENCOUNTER — Ambulatory Visit: Payer: Medicaid Other

## 2017-11-01 ENCOUNTER — Ambulatory Visit (INDEPENDENT_AMBULATORY_CARE_PROVIDER_SITE_OTHER): Payer: Medicaid Other | Admitting: Family Medicine

## 2017-11-01 VITALS — BP 141/84 | HR 74 | Temp 98.5°F | Ht 66.5 in | Wt 273.6 lb

## 2017-11-01 DIAGNOSIS — R030 Elevated blood-pressure reading, without diagnosis of hypertension: Secondary | ICD-10-CM

## 2017-11-01 DIAGNOSIS — Z5189 Encounter for other specified aftercare: Secondary | ICD-10-CM

## 2017-11-01 DIAGNOSIS — Z98891 History of uterine scar from previous surgery: Secondary | ICD-10-CM

## 2017-11-01 LAB — COMPREHENSIVE METABOLIC PANEL
ALK PHOS: 127 IU/L — AB (ref 39–117)
ALT: 17 IU/L (ref 0–32)
AST: 15 IU/L (ref 0–40)
Albumin/Globulin Ratio: 1.6 (ref 1.2–2.2)
Albumin: 3.9 g/dL (ref 3.5–5.5)
BUN/Creatinine Ratio: 10 (ref 9–23)
BUN: 10 mg/dL (ref 6–20)
Bilirubin Total: 0.3 mg/dL (ref 0.0–1.2)
CO2: 22 mmol/L (ref 20–29)
CREATININE: 0.99 mg/dL (ref 0.57–1.00)
Calcium: 9.3 mg/dL (ref 8.7–10.2)
Chloride: 103 mmol/L (ref 96–106)
GFR calc Af Amer: 85 mL/min/{1.73_m2} (ref >59)
GFR calc non Af Amer: 74 mL/min/{1.73_m2} (ref >59)
GLUCOSE: 88 mg/dL (ref 65–99)
Globulin, Total: 2.4 g/dL (ref 1.5–4.5)
Potassium: 4.1 mmol/L (ref 3.5–5.2)
Sodium: 141 mmol/L (ref 134–144)
Total Protein: 6.3 g/dL (ref 6.0–8.5)

## 2017-11-01 LAB — CBC
HEMATOCRIT: 33.6 % — AB (ref 34.0–46.6)
HEMOGLOBIN: 11.6 g/dL (ref 11.1–15.9)
MCH: 29.2 pg (ref 26.6–33.0)
MCHC: 34.5 g/dL (ref 31.5–35.7)
MCV: 85 fL (ref 79–97)
Platelets: 401 10*3/uL (ref 150–450)
RBC: 3.97 x10E6/uL (ref 3.77–5.28)
RDW: 14.3 % (ref 12.3–15.4)
WBC: 7.7 10*3/uL (ref 3.4–10.8)

## 2017-11-01 MED ORDER — AMLODIPINE BESYLATE 5 MG PO TABS: 5.0000 mg | ORAL_TABLET | Freq: Every day | ORAL | 0 refills | Status: DC

## 2017-11-01 NOTE — Progress Notes (Signed)
Here for wound check.States wound vac was d/c about a week ago because it wasn't working. Wound with area open quarter sized near right edge of wound. Asked Dr Juleen China to come in to see patient. Also noted bp elevated. Patient c/o occasional headaches. Labs ordered. Gave patient gauze and tape for wound care and instructed to keep pp appt as scheduled. Advised to come to [facility] for any wound issues, fever or severe headache ,etc.

## 2017-11-02 LAB — PROTEIN / CREATININE RATIO, URINE
CREATININE, UR: 282.9 mg/dL
PROTEIN UR: 44.8 mg/dL
PROTEIN/CREAT RATIO: 158 mg/g{creat} (ref 0–200)

## 2017-11-05 ENCOUNTER — Encounter: Payer: Self-pay | Admitting: Family Medicine

## 2017-11-06 ENCOUNTER — Encounter: Payer: Self-pay | Admitting: Family Medicine

## 2017-11-06 NOTE — Progress Notes (Signed)
   Postpartum Wound Check Subjective:  Tina Riggs is a 37 y.o. [phone] who is about 2 weeks postpartum from repeat LTCS. She presents today for a wound check with RN. I was asked to see patient because of slight dehiscence.  States she removed wound vac as instructed after about 1 week.  Objective:   Vitals:   11/01/17 1049 11/01/17 1051  BP: (!) 142/88 (!) 141/84  Pulse: 74   Temp: 98.5 F (36.9 C)   Weight: 124.1 kg   Height: 5' 6.5" (1.689 m)    General:  Alert, oriented and cooperative. Patient is in no acute distress.  Skin: Skin is warm and dry. No rash noted.   Cardiovascular: Normal heart rate noted  Respiratory: Normal respiratory effort, no problems with respiration noted  Abdomen: Soft, non-tender. Wound with small area (about 1cm) where partial opening to subcutaneous layer but no deeper   Extremities: Normal range of motion.     Mental Status: Normal mood and affect. Normal behavior. Normal judgment and thought content.   Assessment and Plan:  [id number] who is about 2 weeks postpartum from repeat LTCS seen today for wound check and also found to have elevated blood pressures.   Incision Check: Healing well, healthy tissue. Recommended keeping area dry as best as possible, monitoring for signs of worsening wound separation. Keep postpartum visit.   Elevated blood pressure reading: Asymptomatic. CBC and CMP wnl.  -- started amlodipine 5mg  daily -- received return precautions -- keep postpartum visit in about 2 weeks   Return in about 13 days (around 11/14/2017) for postpartum as scheduled .  Future Appointments  Date Time Provider [place]  11/14/2017  2:15 PM Anyanwu, Sallyanne Havers, MD [place], [place]

## 2017-11-13 ENCOUNTER — Ambulatory Visit (INDEPENDENT_AMBULATORY_CARE_PROVIDER_SITE_OTHER): Payer: Medicaid Other | Admitting: Nurse Practitioner

## 2017-11-13 ENCOUNTER — Encounter: Payer: Self-pay | Admitting: Nurse Practitioner

## 2017-11-13 DIAGNOSIS — Z30017 Encounter for initial prescription of implantable subdermal contraceptive: Secondary | ICD-10-CM | POA: Diagnosis not present

## 2017-11-13 DIAGNOSIS — Z3202 Encounter for pregnancy test, result negative: Secondary | ICD-10-CM

## 2017-11-13 DIAGNOSIS — Z538 Procedure and treatment not carried out for other reasons: Secondary | ICD-10-CM

## 2017-11-13 DIAGNOSIS — Z6841 Body Mass Index (BMI) 40.0 and over, adult: Secondary | ICD-10-CM

## 2017-11-13 LAB — POCT PREGNANCY, URINE: PREG TEST UR: NEGATIVE

## 2017-11-13 MED ORDER — ETONOGESTREL 68 MG ~~LOC~~ IMPL
68.0000 mg | DRUG_IMPLANT | Freq: Once | SUBCUTANEOUS | Status: AC
  Administered 2017-11-13: 68 mg via SUBCUTANEOUS

## 2017-11-13 NOTE — Progress Notes (Signed)
Subjective:     Tina Riggs is a 37 y.o. female who presents for a postpartum visit. She is 4 weeks postpartum following a low cervical transverse Cesarean section. I have fully reviewed the prenatal and intrapartum course. The delivery was at 66 gestational weeks. Outcome: repeat cesarean section, low transverse incision. Anesthesia: spinal. Postpartum course has been complicated by an opening in the C/S incision which is healing. Baby's course has been healthy.  Baby is feeding by bottle - Enfamil Nepro. Bleeding no bleeding. Bowel function is normal. Bladder function is normal. Patient is not sexually active. Desired Contraception method is Nexplanon.  Reports no intercourse since delivery. Postpartum depression screening: negative.  Client has her baby and her 32 year old son with her today.  There was lots of discussion between her and her 35 year old about their cell phones.  Client was on and off the phone on calls.  The following portions of the patient's history were reviewed and updated as appropriate: allergies, past family history, past medical history, past social history, past surgical history and problem list.  Review of Systems Pertinent items noted in HPI and remainder of comprehensive ROS otherwise negative.   Objective:    BP 133/88   Pulse 73   Wt 271 lb 1.6 oz (123 kg)   BMI 43.10 kg/m   General:  alert, cooperative and mild distress   Breasts:  deferred  Lungs: clear to auscultation bilaterally  Heart:  regular rate and rhythm, S1, S2 normal, no murmur, click, rub or gallop  Abdomen: soft, nontender, C/S incision healing - one open area in the center of the incision, not fully scabbed 4 mm x 3 mm.  Yellow wet tissue with a small pink center.  No drainage.  No bleeding.  No tenderness.  Client reports odor but no odor detected by provider.  Incision is in a skin fold and is not exposed to air when skin is relaxed.   Vulva:  normal  Vagina: normal vagina  Cervix:   internal os is stenosed and would not admit sound.  Client stated to stop the IUD insertion due to cramping.  Never was able to sound into the uterus - was only in the cervix at approx 4 cm  Corpus: exam limited due to habitus  Adnexa:  nontender bilaterally  Rectal Exam: Not performed.     Nexplanon Insertion Procedure Patient identified, informed consent performed, consent signed.   Patient does understand that irregular bleeding is a very common side effect of this medication. She was advised to have backup contraception for one week after placement. Pregnancy test in clinic today was negative.  Appropriate time out taken.  Patient's left arm was prepped and draped in the usual sterile fashion.  Arm measured and insertion area identified.  Patient was prepped with betadine, alcohol swab and then injected with 3 ml of 1% lidocaine.  Nexplanon removed from packaging,  Device confirmed in needle, then inserted full length of needle and withdrawn per handbook instructions. Nexplanon was able to palpated in the patient's arm; patient palpated the insert herself. There was minimal blood loss.  Patient insertion site covered with guaze and a pressure bandage to reduce any bruising.  The patient tolerated the procedure well and was given post procedure instructions.     Assessment:    Normal postpartum exam. Pap smear not done at today's visit.   Incision closing well, IUD not able to be inserted due to stenosed internal cervical os. Nexplanon  inserted.  Plan:    1. Contraception: Nexplanon 2. Advised weight loss. Advised no unprotected sex for 2 weeks.  Offered condoms. 3. Follow up in: 1 year or as needed.

## 2017-11-13 NOTE — Patient Instructions (Signed)
Contraceptive Implant Information A contraceptive implant is a small, plastic rod that is inserted under the skin. The implant releases a hormone into the bloodstream that prevents pregnancy. Contraceptive implants can be effective for up to 3 years. They do not provide protection against STIs (sexually transmitted infections). How does the implant work? Contraceptive implants prevent pregnancy by releasing a small amount of progestin into the bloodstream. Progestin has similar effects to the hormone progesterone, which plays a role in menstrual periods and pregnancy. Progestin will:  Stop the ovaries from releasing eggs.  Thicken cervical mucus to prevent sperm from entering the cervix.  Thin out the lining of the uterus to prevent a fertilized egg from attaching to the wall of the uterus.  What are the advantages of this form of birth control? The advantages of this form of birth control include the following:  It is very effective at preventing pregnancy.  It is effective for up to 3 years.  It can easily be removed.  It does not interfere with sex or daily activities.  It can be used when breastfeeding.  It can be used by women who cannot take estrogen.  The procedure to insert the device is quick.  Women can get pregnant shortly after removing the device.  What are the disadvantages of this form of birth control? The disadvantages of this form of birth control include the following:  It can cause side effects, including: ? Irregular menstrual periods or bleeding. ? Headache. ? Weight gain. ? Acne. ? Breast tenderness. ? Abdomen (abdominal) pain. ? Mood changes, such as depression.  It does not protect against STIs.  You must make an office visit to have it inserted and removed by a trained clinician.  Inserting or removing the device can result in pain, scarring, and tissue or nerve damage (rare).  How is this implant inserted? The procedure to insert an implant  only takes a few minutes. During the procedure:  Your upper arm will be numbed with a numbing medicine (local anesthetic).  The implant will be injected under the skin of your upper arm with a needle.  After the procedure:  You may experience minor bruising, swelling, or discomfort at the insertion site. This should only last for a couple of days.  You may need to use another, non-hormonal contraceptive such as a condom for 7 days after the procedure.  How is the implant removed? The implant should be removed after 3 years or as directed by your health care provider. The procedure to remove the implant only takes a few minutes. During this procedure:  Your upper arm will be numbed with a local anesthetic.  A small incision will be made near the implant.  The implant will be removed with a small pair of forceps.  After the implant is removed:  The effect of the implant will wear off a few hours after removal. Most women will be able to get pregnant within 3 weeks of removal.  A new implant can be inserted as soon as the old one is removed, if desired.  You may experience minor bruising, swelling, or discomfort at the removal site. This should only last for a couple of days.  Is this implant right for me? Your health care provider can help you determine whether you are good candidate for a contraceptive implant. Make sure to discuss the possible side effects with your health care provider. You should not get the implant if you:  Are pregnant.  Are allergic  to any part of the implant.  Have a history of: ? Breast cancer. ? Unusual bleeding from the vagina. ? Heart disease. ? Stroke. ? Liver disease or tumors. ? Migraines.  Summary  A contraceptive implant is a small, plastic rod that is inserted under the skin. The implant releases a hormone into the bloodstream that prevents pregnancy.  Contraceptive implants can be effective for up to 3 years.  The implant works by  preventing ovaries from releasing eggs, thickening the cervical mucus, and thinning the uterine wall.  This form of birth control is very effective at preventing pregnancy and can be inserted and removed quickly. Women can get pregnant shortly after the device is removed.  This form of birth control can cause some side effects, including weight gain, breast tenderness, headaches, irregular periods or bleeding, acne, abdominal pain, and depression. It does not provide protection against STIs (sexually transmitted infections). This information is not intended to replace advice given to you by your health care provider. Make sure you discuss any questions you have with your health care provider. Document Released: 12/08/2010 Document Revised: 12/04/2015 Document Reviewed: 12/04/2015 Elsevier Interactive Patient Education  2017 [employer].

## 2017-11-14 ENCOUNTER — Ambulatory Visit: Payer: Medicaid Other | Admitting: Obstetrics & Gynecology

## 2017-12-18 ENCOUNTER — Ambulatory Visit: Payer: Self-pay | Admitting: Sports Medicine

## 2018-01-10 ENCOUNTER — Ambulatory Visit: Payer: Medicaid Other | Admitting: Podiatry

## 2018-01-24 ENCOUNTER — Ambulatory Visit: Payer: Medicaid Other | Admitting: Podiatry

## 2018-04-02 IMAGING — CT CT ABD-PELV W/ CM
2 of 4 series · 16 of 46 positions shown, 18 images · IV contrast (Omni 300)
Comparison: Abdominal pelvic CT 10/17/2014

CLINICAL DATA: Low abdominal pain and distention for 1 or 2 days.
History of diverticulitis with partial bowel resection and
colostomy.

EXAM:
CT ABDOMEN AND PELVIS WITH CONTRAST
TECHNIQUE: Multidetector CT imaging of the abdomen and pelvis was performed
using the standard protocol following bolus administration of
intravenous contrast.
CONTRAST:  100mL 6H6H53-LLL IOPAMIDOL (6H6H53-LLL) INJECTION 61%

[Series 2: a/p w/ 5mm · axial · 0.98mm/px · z∈[-516,-46]mm · 13 of 104 slices shown, 15 images]
[im 5/104  soft-tissue]
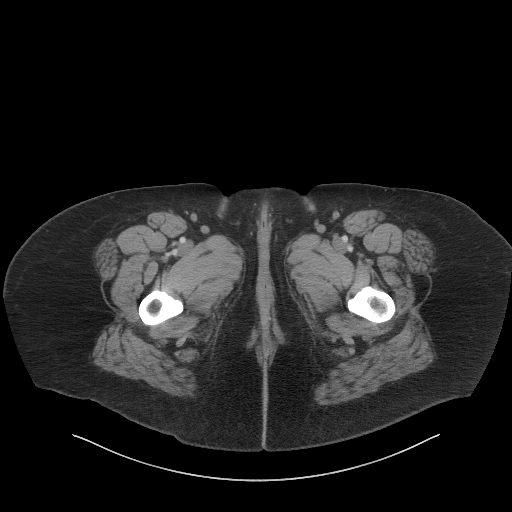
[im 5/104  bone]
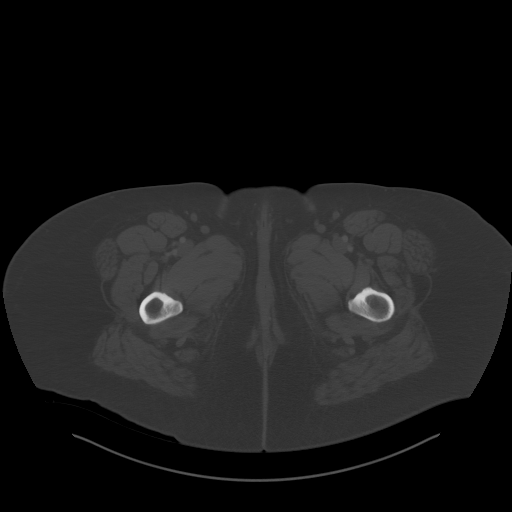
[im 13/104  soft-tissue]
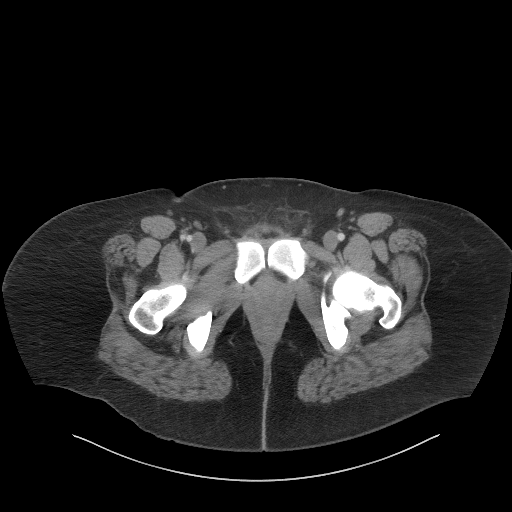
[im 22/104  soft-tissue]
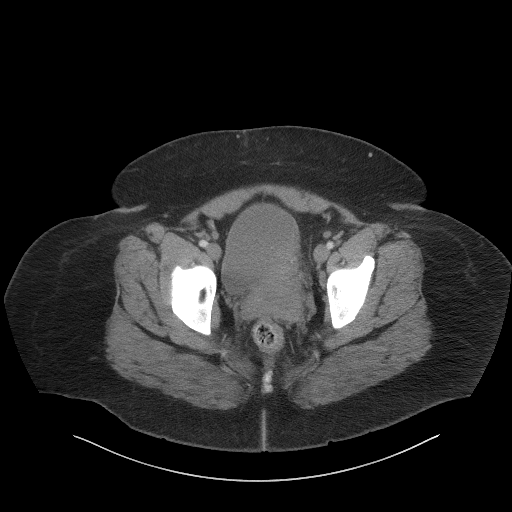
[im 31/104  soft-tissue]
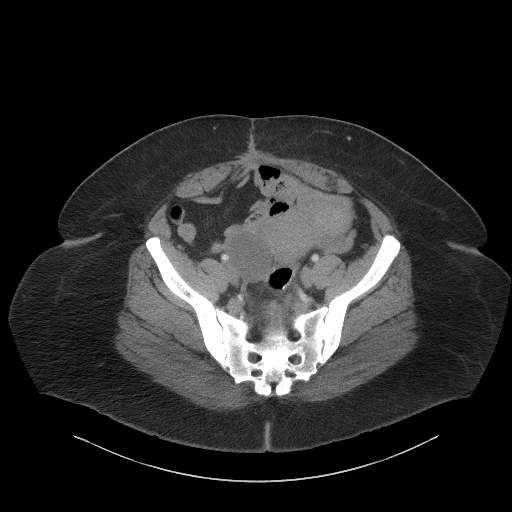
[im 35/104  soft-tissue]
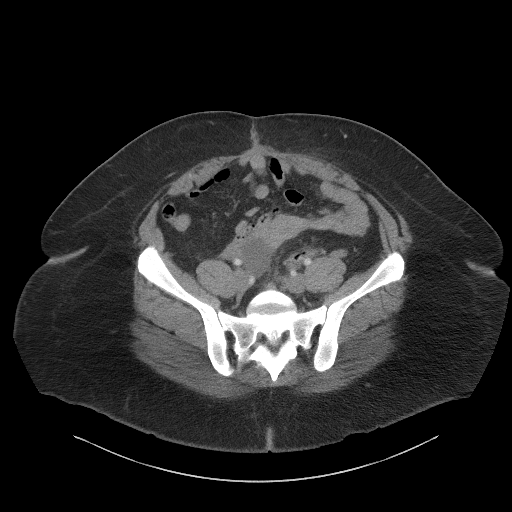
[im 43/104  soft-tissue]
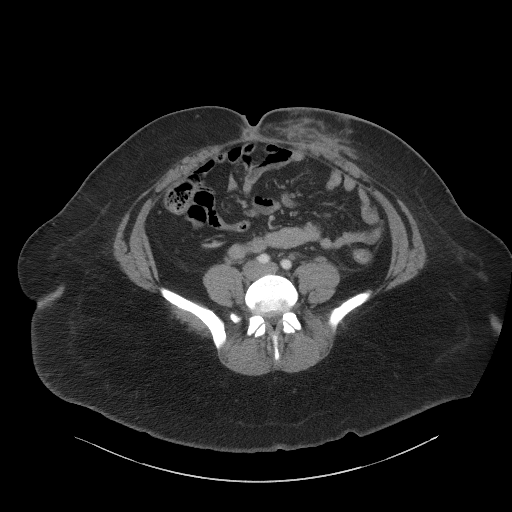
[im 52/104  soft-tissue]
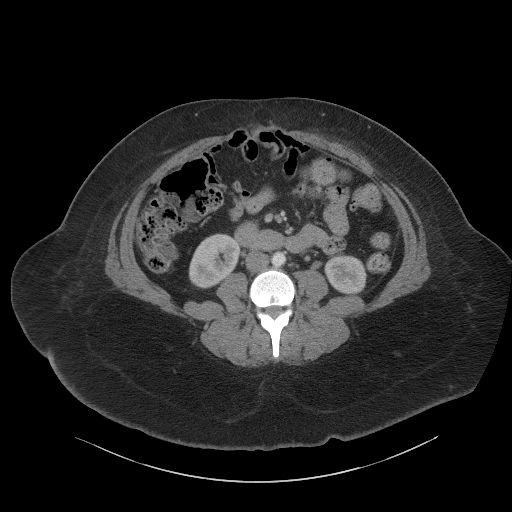
[im 61/104  soft-tissue]
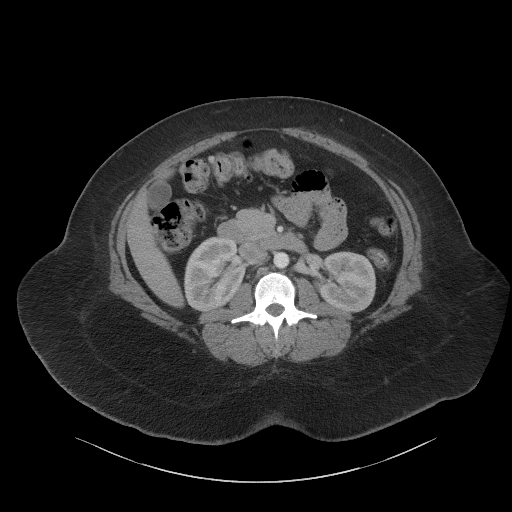
[im 69/104  soft-tissue]
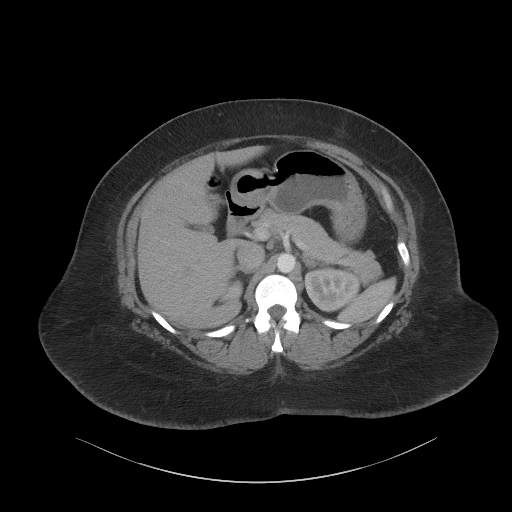
[im 69/104  bone]
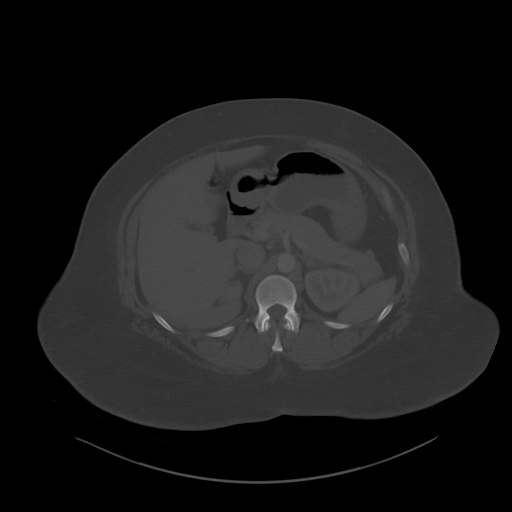
[im 73/104  soft-tissue]
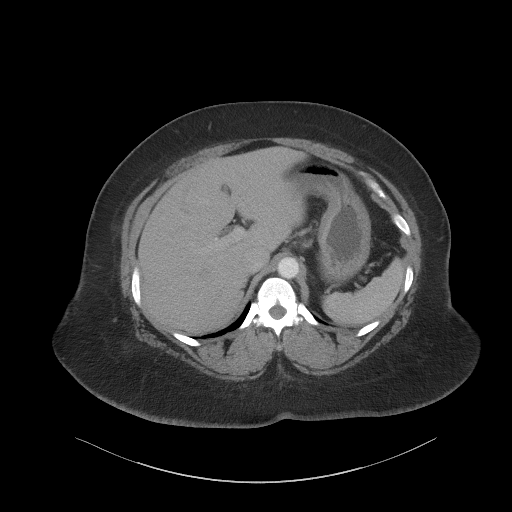
[im 82/104  soft-tissue]
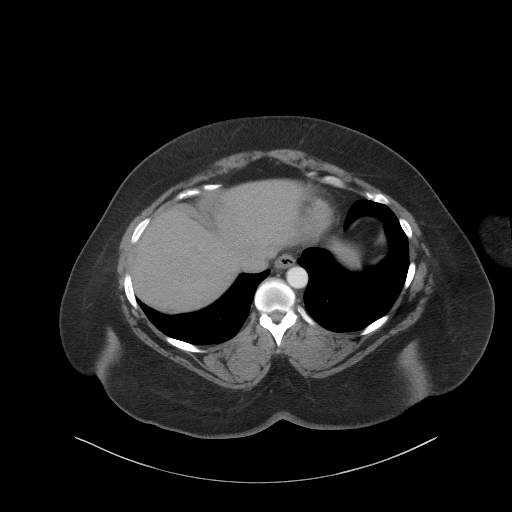
[im 91/104  soft-tissue]
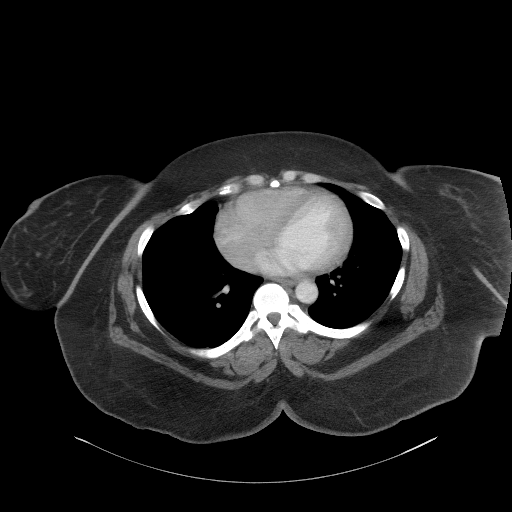
[im 99/104  soft-tissue]
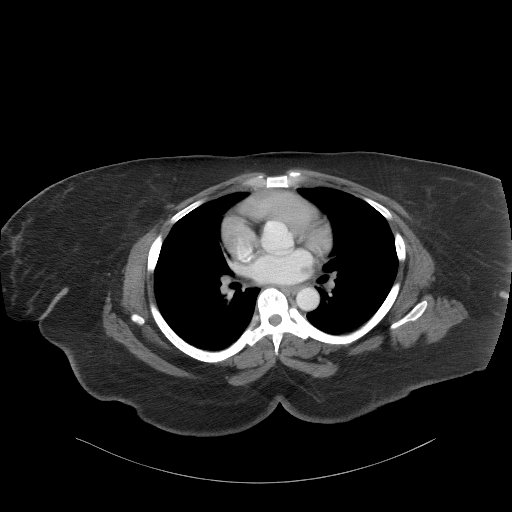

[Series 5: a/p w/ cor · coronal · 0.96mm/px · 3 of 190 slices shown]
[im 64/190  soft-tissue]
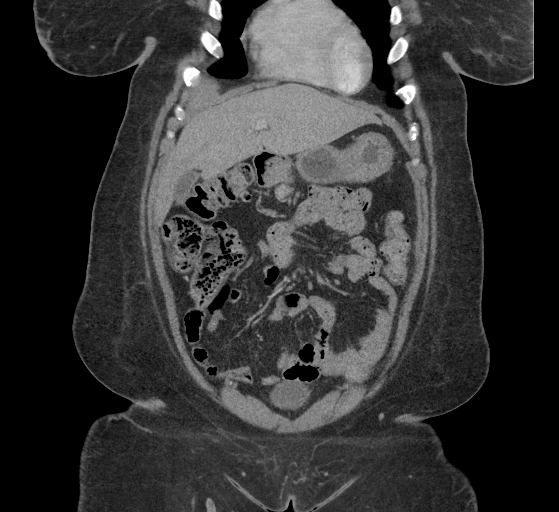
[im 85/190  soft-tissue]
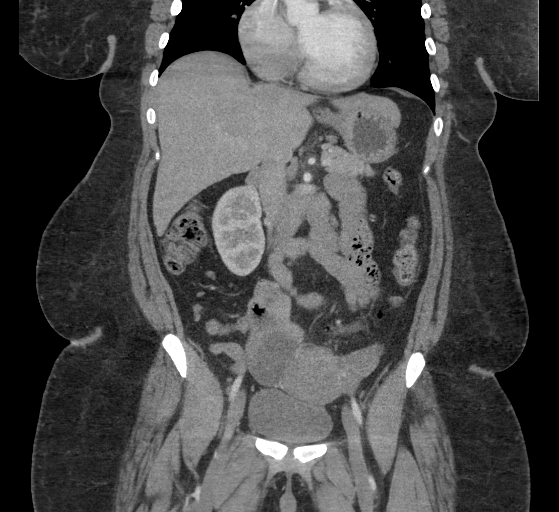
[im 106/190  soft-tissue]
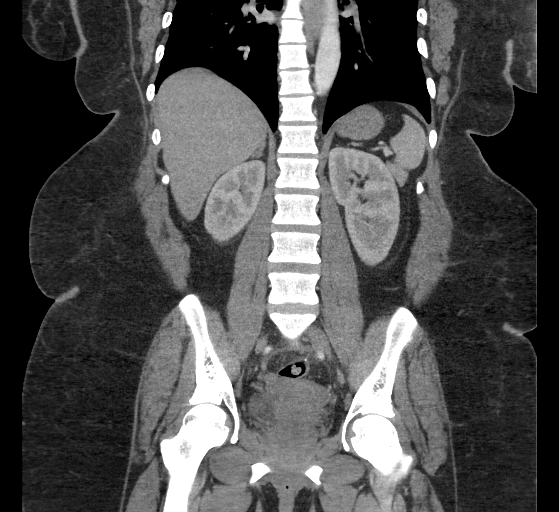

[16 of 46 positions shown; findings below may reference images not displayed]

FINDINGS: Lower chest: Clear lung bases. No significant pleural or pericardial
effusion.

Hepatobiliary: The liver is normal in density without focal
abnormality. No evidence of gallstones, gallbladder wall thickening
or biliary dilatation.

Pancreas: Unremarkable. No pancreatic ductal dilatation or
surrounding inflammatory changes.

Spleen: Normal in size without focal abnormality.

Adrenals/Urinary Tract: Both adrenal glands appear normal. The
kidneys appear normal without evidence of urinary tract calculus,
suspicious lesion or hydronephrosis. No bladder abnormalities are
seen.

Stomach/Bowel: No enteric contrast was administered. The stomach,
small bowel and appendix appear normal. Patient has undergone
interval takedown of the descending colostomy. The distal colonic
anastomosis appears patent. The sigmoid colon just proximal to the
anastomosis demonstrates possible mild wall thickening and
surrounding inflammation. No evidence of bowel obstruction or
perforation.

Vascular/Lymphatic: There are no enlarged abdominal or pelvic lymph
nodes. No significant vascular findings are present.

Reproductive: Left fundal fibroid measuring 4.6 x 4.3 cm is similar
to the prior examination. There is no residual left ovarian cystic
lesion. There is a low-density lesion in the right adnexa measuring
5.4 x 3.9 cm on image 74. This is probably an incidental right
ovarian cyst. There is no surrounding inflammatory change.

Other: Postsurgical changes in the anterior abdominal wall. No
evidence of hernia. No significant ascites.

Musculoskeletal: No acute or significant osseous findings.
IMPRESSION: 1. Interval colostomy takedown and anastomosis. Mild sigmoid
diverticulitis proximal to the anastomosis cannot be excluded.
2. No evidence of bowel perforation or abscess. The appendix appears
normal.
3. Fluctuating ovarian cysts as described. Stable exophytic left
uterine fibroid.

## 2018-06-23 ENCOUNTER — Encounter (HOSPITAL_COMMUNITY): Payer: Self-pay | Admitting: Emergency Medicine

## 2018-06-23 ENCOUNTER — Emergency Department (HOSPITAL_COMMUNITY): Payer: Self-pay

## 2018-06-23 ENCOUNTER — Emergency Department (HOSPITAL_COMMUNITY)
Admission: EM | Admit: 2018-06-23 | Discharge: 2018-06-23 | Disposition: A | Discharge status: Home / Self Care | Payer: Self-pay | Attending: Emergency Medicine | Admitting: Emergency Medicine

## 2018-06-23 ENCOUNTER — Other Ambulatory Visit: Payer: Self-pay

## 2018-06-23 DIAGNOSIS — D259 Leiomyoma of uterus, unspecified: Secondary | ICD-10-CM | POA: Insufficient documentation

## 2018-06-23 DIAGNOSIS — R1084 Generalized abdominal pain: Secondary | ICD-10-CM | POA: Insufficient documentation

## 2018-06-23 DIAGNOSIS — D219 Benign neoplasm of connective and other soft tissue, unspecified: Secondary | ICD-10-CM

## 2018-06-23 DIAGNOSIS — F17210 Nicotine dependence, cigarettes, uncomplicated: Secondary | ICD-10-CM | POA: Insufficient documentation

## 2018-06-23 DIAGNOSIS — R109 Unspecified abdominal pain: Secondary | ICD-10-CM

## 2018-06-23 DIAGNOSIS — Z79899 Other long term (current) drug therapy: Secondary | ICD-10-CM | POA: Insufficient documentation

## 2018-06-23 LAB — COMPREHENSIVE METABOLIC PANEL
ALT: 17 U/L (ref 0–44)
AST: 18 U/L (ref 15–41)
Albumin: 3.6 g/dL (ref 3.5–5.0)
Alkaline Phosphatase: 103 U/L (ref 38–126)
Anion gap: 9 (ref 5–15)
BUN: 12 mg/dL (ref 6–20)
CO2: 24 mmol/L (ref 22–32)
Calcium: 8.4 mg/dL — ABNORMAL LOW (ref 8.9–10.3)
Chloride: 107 mmol/L (ref 98–111)
Creatinine, Ser: 0.71 mg/dL (ref 0.44–1.00)
GFR calc Af Amer: >60 mL/min (ref >60)
GFR calc non Af Amer: >60 mL/min (ref >60)
Glucose, Bld: 98 mg/dL (ref 70–99)
Potassium: 3.8 mmol/L (ref 3.5–5.1)
Sodium: 140 mmol/L (ref 135–145)
Total Bilirubin: 0.4 mg/dL (ref 0.3–1.2)
Total Protein: 6.9 g/dL (ref 6.5–8.1)

## 2018-06-23 LAB — CBC
HCT: 40.7 % (ref 36.0–46.0)
Hemoglobin: 13.1 g/dL (ref 12.0–15.0)
MCH: 31.8 pg (ref 26.0–34.0)
MCHC: 32.2 g/dL (ref 30.0–36.0)
MCV: 98.8 fL (ref 80.0–100.0)
Platelets: 329 10*3/uL (ref 150–400)
RBC: 4.12 MIL/uL (ref 3.87–5.11)
RDW: 14.3 % (ref 11.5–15.5)
WBC: 8.8 10*3/uL (ref 4.0–10.5)
nRBC: 0 % (ref 0.0–0.2)

## 2018-06-23 LAB — URINALYSIS, ROUTINE W REFLEX MICROSCOPIC
Bacteria, UA: NONE SEEN
Bilirubin Urine: NEGATIVE
Glucose, UA: NEGATIVE mg/dL
Ketones, ur: NEGATIVE mg/dL
Leukocytes,Ua: NEGATIVE
Nitrite: NEGATIVE
Protein, ur: NEGATIVE mg/dL
Specific Gravity, Urine: 1.029 (ref 1.005–1.030)
pH: 6 (ref 5.0–8.0)

## 2018-06-23 LAB — LIPASE, BLOOD: Lipase: 26 U/L (ref 11–51)

## 2018-06-23 LAB — I-STAT BETA HCG BLOOD, ED (MC, WL, AP ONLY): I-stat hCG, quantitative: <5 m[IU]/mL (ref <5)

## 2018-06-23 MED ORDER — ONDANSETRON HCL 4 MG PO TABS: 4.0000 mg | ORAL_TABLET | Freq: Three times a day (TID) | ORAL | 0 refills | Status: DC | PRN

## 2018-06-23 MED ORDER — IOHEXOL 300 MG/ML  SOLN
100.0000 mL | Freq: Once | INTRAMUSCULAR | Status: AC | PRN
  Administered 2018-06-23: 100 mL via INTRAVENOUS

## 2018-06-23 MED ORDER — SODIUM CHLORIDE (PF) 0.9 % IJ SOLN
INTRAMUSCULAR | Status: AC
  Filled 2018-06-23: qty 50

## 2018-06-23 MED ORDER — MORPHINE SULFATE (PF) 4 MG/ML IV SOLN
4.0000 mg | Freq: Once | INTRAVENOUS | Status: AC
  Administered 2018-06-23: 4 mg via INTRAVENOUS
  Filled 2018-06-23: qty 1

## 2018-06-23 MED ORDER — DICYCLOMINE HCL 20 MG PO TABS: 20.0000 mg | ORAL_TABLET | Freq: Two times a day (BID) | ORAL | 0 refills | Status: DC | PRN

## 2018-06-23 NOTE — ED Notes (Signed)
Pt ambulated to restroom. 

## 2018-06-23 NOTE — Discharge Instructions (Addendum)
It is very important that you follow up with OB/GYN.  The information for [facility] as listed below. I also included information for 2 primary care clinics.  I recommend that you call to establish care. Your CT today showed that you have a fibroid which is unchanged from previous.  This may be the cause of your symptoms. Use Tylenol and ibuprofen as needed for mild to moderate pain.  Use Bentyl as needed for spasm.  Use Zofran as needed for nausea or vomiting. Use heating pads to help with pain. Return to emergency room if you develop high fevers, persistent vomiting despite medication, severe worsening pain, or any new, worsening, concerning symptoms.

## 2018-06-23 NOTE — ED Provider Notes (Signed)
[place] [facility] DEPT Provider Note   CSN: [id number] Arrival date & time: 06/23/18  1437     History   Chief Complaint Chief Complaint  Patient presents with  . Diarrhea  . Abdominal Pain    HPI Tina Riggs is a 38 y.o. female senting for evaluation of diarrhea and abdominal pain.  Patient states she has been having intermittent abdominal pain for the past week.  Pain is described as a cramp, often associated with the need to have a bowel movement, but not always.  Patient reports pain was worse last night, although has improved slightly today.  She reports frequent bowel movements, but her stools are nonbloody.  She reports intermittent nausea, none currently.  Transfusing chills, chest pain, shortness breath, cough, vomiting, or abnormal urination.  She reports a history of diverticulitis for which she needed a colostomy and is status post revision.  She denies sick contacts at home.  She denies recent travel.  She states she takes no medications daily.  She has been taking Advil and naproxen without improvement of symptoms, she has not tried anything else.  Patient reports symptoms improved slightly with her heating pad.  Patient had a C-section 8 months ago.  She is not currently breast-feeding.     HPI  Past Medical History:  Diagnosis Date  . Diverticulitis    hospitalized 04/13/2014; hospitalized 04/29/2014  . HPV in female   . Vaginal Pap smear, abnormal     Patient Active Problem List   Diagnosis Date Noted  . History of cesarean section 05/02/2017  . BMI 40.0-44.9, adult ([place]) 04/11/2017  . 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 Diverticulitis of colon with perforation 04/13/2014  . Tobacco use 04/13/2014    Past Surgical History:  Procedure Laterality Date  . CESAREAN SECTION  02/2006  . CESAREAN SECTION N/A 10/17/2017   Procedure: REPEAT CESAREAN SECTION;  Surgeon: Caren Macadam, MD;  Location: [facility];  Service:  Obstetrics;  Laterality: N/A;  . COLON SURGERY    . COLONOSCOPY N/A 06/11/2015   Procedure: COLONOSCOPY;  Surgeon: Leighton Ruff, MD;  Location: [facility];  Service: General;  Laterality: N/A;  . COLOSTOMY N/A 05/18/2014   Procedure: COLOSTOMY;  Surgeon: Georganna Skeans, MD;  Location: [facility];  Service: General;  Laterality: N/A;  . COLOSTOMY REVERSAL  06/2015   robotic assisted colostomy reversal with LOA/notes 06/16/2015  . COLOSTOMY REVISION N/A 05/18/2014   Procedure: COLON RESECTION SIGMOID;  Surgeon: Georganna Skeans, MD;  Location: [facility];  Service: General;  Laterality: N/A;  . COLPOSCOPY  Sep 2012  . LAPAROSCOPIC LYSIS OF ADHESIONS N/A 05/18/2014   Procedure: LAPAROSCOPIC MOBILIZATION OF SPLENIC FLEXURE;  Surgeon: Georganna Skeans, MD;  Location: [facility];  Service: General;  Laterality: N/A;     OB History    Gravida  2   Para  1   Term  1   Preterm      AB      Living  1     SAB      TAB      Ectopic      Multiple      Live Births  1            Home Medications    Prior to Admission medications   Medication Sig Start Date End Date Taking? Authorizing Provider  etonogestrel (NEXPLANON) 68 MG IMPL implant 1 each by Subdermal route once. 5 years   Yes [provider]  dicyclomine (  BENTYL) 20 MG tablet Take 1 tablet (20 mg total) by mouth 2 (two) times daily as needed for spasms. 06/23/18   Auriel Kist, PA-C  ibuprofen (ADVIL,MOTRIN) 600 MG tablet Take 1 tablet (600 mg total) by mouth every 6 (six) hours as needed for mild pain or moderate pain. Patient not taking: Reported on 06/23/2018 10/19/17   Patriciaann Clan, DO  ondansetron (ZOFRAN) 4 MG tablet Take 1 tablet (4 mg total) by mouth every 8 (eight) hours as needed. 06/23/18   Iosefa Weintraub, PA-C    Family History Family History  Problem Relation Age of Onset  . HIV Mother   . Hypertension Mother     Social History Social History   Tobacco Use  . Smoking status: Current Every Day Smoker     Packs/day: 0.50    Years: 18.00    Pack years: 9.00    Types: Cigarettes  . Smokeless tobacco: Never Used  Substance Use Topics  . Alcohol use: Yes    Alcohol/week: 0.0 standard drinks    Comment: wine  . Drug use: No     Allergies   Robaxin [methocarbamol] and Penicillins   Review of Systems Review of Systems  Gastrointestinal: Positive for abdominal pain, diarrhea and nausea.  All other systems reviewed and are negative.    Physical Exam Updated Vital Signs BP (!) 189/89   Pulse 82   Temp 98.7 F (37.1 C) (Oral)   Resp 18   SpO2 100%   Physical Exam Vitals signs and nursing note reviewed.  Constitutional:      General: She is not in acute distress.    Appearance: She is well-developed.     Comments: Obese female resting comfortably in the bed in no acute distress  HENT:     Head: Normocephalic and atraumatic.  Eyes:     Conjunctiva/sclera: Conjunctivae normal.     Pupils: Pupils are equal, round, and reactive to light.  Neck:     Musculoskeletal: Normal range of motion and neck supple.  Cardiovascular:     Rate and Rhythm: Normal rate and regular rhythm.     Pulses: Normal pulses.  Pulmonary:     Effort: Pulmonary effort is normal. No respiratory distress.     Breath sounds: Normal breath sounds. No wheezing.  Abdominal:     General: There is no distension.     Palpations: Abdomen is soft. There is no mass.     Tenderness: There is abdominal tenderness in the periumbilical area and suprapubic area. There is no right CVA tenderness, left CVA tenderness, guarding or rebound.     Comments: Tenderness palpation of periumbilical and suprapubic abdomen.  No rigidity, guarding, distention.  Negative rebound.  Peritonitis.  Musculoskeletal: Normal range of motion.  Skin:    General: Skin is warm and dry.  Neurological:     Mental Status: She is alert and oriented to person, place, and time.      ED Treatments / Results  Labs (all labs ordered are listed,  but only abnormal results are displayed) Labs Reviewed  COMPREHENSIVE METABOLIC PANEL - Abnormal; Notable for the following components:      Result Value   Calcium 8.4 (*)    All other components within normal limits  URINALYSIS, ROUTINE W REFLEX MICROSCOPIC - Abnormal; Notable for the following components:   APPearance HAZY (*)    Hgb urine dipstick SMALL (*)    All other components within normal limits  LIPASE, BLOOD  CBC  I-STAT  BETA HCG BLOOD, ED ([facility], [facility], [facility] ONLY)    EKG    Radiology Ct Abdomen Pelvis W Contrast  Result Date: 06/23/2018 CLINICAL DATA:  38 year old female with acute abdominal pain and diarrhea for 1 week. History of partial colectomy for diverticulitis. EXAM: CT ABDOMEN AND PELVIS WITH CONTRAST TECHNIQUE: Multidetector CT imaging of the abdomen and pelvis was performed using the standard protocol following bolus administration of intravenous contrast. CONTRAST:  167mL OMNIPAQUE IOHEXOL 300 MG/ML  SOLN COMPARISON:  01/12/2017 and prior CTs FINDINGS: Lower chest: No acute abnormality.  Cardiomegaly identified. Hepatobiliary: The liver and gallbladder are unremarkable. No biliary dilatation. Pancreas: Unremarkable Spleen: Unremarkable Adrenals/Urinary Tract: The kidneys, adrenal glands and bladder are unremarkable. Stomach/Bowel: There is no evidence of bowel obstruction, bowel wall thickening or inflammatory changes. Colonic surgical changes again noted. Small paramedian ventral hernias are again noted, with one supraumbilical ventral hernia containing a loop of small bowel, unchanged. The appendix is normal. Vascular/Lymphatic: Aortic atherosclerosis. No enlarged abdominal or pelvic lymph nodes. Reproductive: A 5 cm exophytic LEFT uterine fibroid is again noted. No other significant abnormalities. Other: No ascites, focal collection or pneumoperitoneum. Musculoskeletal: No acute or suspicious bony abnormalities. IMPRESSION: 1. No acute abnormality. 2. Unchanged small ventral  hernias, one which contains a loop of small bowel, but without bowel obstruction or inflammatory changes. 3. Unchanged 5 cm exophytic LEFT uterine fibroid 4.  Aortic Atherosclerosis (ICD10-I70.0). Electronically Signed   By: Margarette Canada M.D.   On: 06/23/2018 19:40    Procedures Procedures (including critical care time)  Medications Ordered in ED Medications  sodium chloride (PF) 0.9 % injection (has no administration in time range)  morphine 4 MG/ML injection 4 mg (4 mg Intravenous Given 06/23/18 1738)  iohexol (OMNIPAQUE) 300 MG/ML solution 100 mL (100 mLs Intravenous Contrast Given 06/23/18 1852)     Initial Impression / Assessment and Plan / ED Course  I have reviewed the triage vital signs and the nursing notes.  Pertinent labs & imaging results that were available during my care of the patient were reviewed by me and considered in my medical decision making (see chart for details).        Patient resenting for evaluation of abdominal cramping, nausea, diarrhea.  Physical examination, she appears nontoxic.  She is afebrile not tachycardic.  Abdominal exam shows mild tenderness palpation periumbilical and suprapubic abdomen.  No signs of a surgical abdomen at this time.  Due to patient's history of diverticulitis requiring colostomy and revision, will obtain labs and CT for further evaluation.  Labs reassuring. No leukocytosis. Kidney, liver, and pancreatic fnx reassuring.  Urine without infection.  CT pending.  CT shows unchanged umbilical hernia and 5cm exophytic fibroid.  Discussed with patient that symptoms may be due to her large fibroid.  Also discussed possible viral cause.  Discussed symptomatic treatment with Bentyl, Tylenol, and anti-inflammatories.  Discussed treatment of heating pads.  Encourage follow-up with OB/GYN and primary care.  At this time, patient appears safe for discharge.  Return precautions given.  Patient states she understands and agrees to plan.  Final  Clinical Impressions(s) / ED Diagnoses   Final diagnoses:  Abdominal cramping  Fibroid    ED Discharge Orders         Ordered    dicyclomine (BENTYL) 20 MG tablet  2 times daily PRN     06/23/18 2023    ondansetron (ZOFRAN) 4 MG tablet  Every 8 hours PRN     06/23/18 2023  Franchot Heidelberg, PA-C 06/23/18 2057    Hayden Rasmussen, MD 06/24/18 [phone]

## 2018-06-23 NOTE — ED Triage Notes (Signed)
Pt c/o diarrhea and intermittent abd pains for week. Hx diverticulitis.

## 2019-01-09 ENCOUNTER — Other Ambulatory Visit: Payer: Self-pay

## 2019-01-09 ENCOUNTER — Ambulatory Visit (HOSPITAL_COMMUNITY)
Admission: EM | Admit: 2019-01-09 | Discharge: 2019-01-09 | Disposition: A | Discharge status: Home / Self Care | Payer: Medicaid Other | Attending: Urgent Care | Admitting: Urgent Care

## 2019-01-09 ENCOUNTER — Encounter (HOSPITAL_COMMUNITY): Payer: Self-pay

## 2019-01-09 ENCOUNTER — Ambulatory Visit (INDEPENDENT_AMBULATORY_CARE_PROVIDER_SITE_OTHER): Payer: Medicaid Other

## 2019-01-09 DIAGNOSIS — J3489 Other specified disorders of nose and nasal sinuses: Secondary | ICD-10-CM

## 2019-01-09 DIAGNOSIS — S0083XA Contusion of other part of head, initial encounter: Secondary | ICD-10-CM

## 2019-01-09 DIAGNOSIS — H1131 Conjunctival hemorrhage, right eye: Secondary | ICD-10-CM | POA: Diagnosis not present

## 2019-01-09 DIAGNOSIS — S0033XA Contusion of nose, initial encounter: Secondary | ICD-10-CM

## 2019-01-09 DIAGNOSIS — F0781 Postconcussional syndrome: Secondary | ICD-10-CM | POA: Diagnosis not present

## 2019-01-09 MED ORDER — CYCLOBENZAPRINE HCL 5 MG PO TABS: 5.0000 mg | ORAL_TABLET | Freq: Every evening | ORAL | 0 refills | Status: DC | PRN

## 2019-01-09 MED ORDER — NAPROXEN 500 MG PO TABS: 500.0000 mg | ORAL_TABLET | Freq: Two times a day (BID) | ORAL | 0 refills | Status: DC
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
## 2019-04-15 ENCOUNTER — Ambulatory Visit: Payer: Medicaid Other | Admitting: Sports Medicine

## 2019-06-10 ENCOUNTER — Telehealth: Payer: Self-pay | Admitting: *Deleted

## 2019-06-10 NOTE — Telephone Encounter (Signed)
I spoke with pt and she states wanted to know the details of her surgery. I informed of the 03/28/2016 surgery description. Pt states she is still having problems with the area and had a pedicure and they pressed the area an pus came out. I offered pt and appt and would have a scheduler call her tomorrow.

## 2019-06-10 NOTE — Telephone Encounter (Signed)
Pt called left her name, DOB and phone number.

## 2019-06-18 ENCOUNTER — Ambulatory Visit: Payer: Medicaid Other | Admitting: Podiatrist

## 2019-06-18 ENCOUNTER — Other Ambulatory Visit: Payer: Self-pay

## 2019-06-18 VITALS — Temp 96.4°F

## 2019-06-18 DIAGNOSIS — D492 Neoplasm of unspecified behavior of bone, soft tissue, and skin: Secondary | ICD-10-CM | POA: Diagnosis not present

## 2019-06-18 DIAGNOSIS — L851 Acquired keratosis [keratoderma] palmaris et plantaris: Secondary | ICD-10-CM

## 2019-06-18 NOTE — Patient Instructions (Addendum)
Purchase callus remover and apply to the areas of your feet once a day.  Do this for a week or 2 and soak then you can try to peel out the skin with a pumice stone or fingernail.    Corns and Calluses Corns are small areas of thickened skin that occur on the top, sides, or tip of a toe. They contain a cone-shaped core with a point that can press on a nerve below. This causes pain.  Calluses are areas of thickened skin that can occur anywhere on the body, including the hands, fingers, palms, soles of the feet, and heels. Calluses are usually larger than corns. What are the causes? Corns and calluses are caused by rubbing (friction) or pressure, such as from shoes that are too tight or do not fit properly. What increases the risk? Corns are more likely to develop in people who have misshapen toes (toe deformities), such as hammer toes. Calluses can occur with friction to any area of the skin. They are more likely to develop in people who:  Work with their hands.  Wear shoes that fit poorly, are too tight, or are high-heeled.  Have toe deformities. What are the signs or symptoms? Symptoms of a corn or callus include:  A hard growth on the skin.  Pain or tenderness under the skin.  Redness and swelling.  Increased discomfort while wearing tight-fitting shoes, if your feet are affected. If a corn or callus becomes infected, symptoms may include:  Redness and swelling that gets worse.  Pain.  Fluid, blood, or pus draining from the corn or callus. How is this diagnosed? Corns and calluses may be diagnosed based on your symptoms, your medical history, and a physical exam. How is this treated? Treatment for corns and calluses may include:  Removing the cause of the friction or pressure. This may involve: ? Changing your shoes. ? Wearing shoe inserts (orthotics) or other protective layers in your shoes, such as a corn pad. ? Wearing gloves.  Applying medicine to the skin (topical  medicine) to help soften skin in the hardened, thickened areas.  Removing layers of dead skin with a file to reduce the size of the corn or callus.  Removing the corn or callus with a scalpel or laser.  Taking antibiotic medicines, if your corn or callus is infected.  Having surgery, if a toe deformity is the cause. Follow these instructions at home:   Take over-the-counter and prescription medicines only as told by your health care provider.  If you were prescribed an antibiotic, take it as told by your health care provider. Do not stop taking it even if your condition starts to improve.  Wear shoes that fit well. Avoid wearing high-heeled shoes and shoes that are too tight or too loose.  Wear any padding, protective layers, gloves, or orthotics as told by your health care provider.  Soak your hands or feet and then use a file or pumice stone to soften your corn or callus. Do this as told by your health care provider.  Check your corn or callus every day for symptoms of infection. Contact a health care provider if you:  Notice that your symptoms do not improve with treatment.  Have redness or swelling that gets worse.  Notice that your corn or callus becomes painful.  Have fluid, blood, or pus coming from your corn or callus.  Have new symptoms. Summary  Corns are small areas of thickened skin that occur on the top, sides,  or tip of a toe.  Calluses are areas of thickened skin that can occur anywhere on the body, including the hands, fingers, palms, and soles of the feet. Calluses are usually larger than corns.  Corns and calluses are caused by rubbing (friction) or pressure, such as from shoes that are too tight or do not fit properly.  Treatment may include wearing any padding, protective layers, gloves, or orthotics as told by your health care provider. This information is not intended to replace advice given to you by your health care provider. Make sure you discuss  any questions you have with your health care provider. Document Revised: 04/10/2018 Document Reviewed: 11/01/2016 Elsevier Patient Education  2020 [employer].

## 2019-06-24 ENCOUNTER — Encounter: Payer: Self-pay | Admitting: Podiatrist

## 2019-06-24 NOTE — Progress Notes (Signed)
  Chief Complaint  Patient presents with  . Skin Problem    Painful lesions - bilateral hallux, L plantar forefoot submet 5. Pt stated, "I had surgery for these years ago, but only the one on my heel resolved. 8/10 pain most of the time. I had a pedicure last year. When they opened the spot on my R [hallux], pus poured out. I treated it myself".     HPI: Patient is 39 y.o. female who presents today for the concerns as listed above. She has had painful lesions on her foot for several years.  She relates pain when they grow large and at one point some pus came out of the lesion on the right foot.  She treated with antibiotic ointment and it healed.     Review of Systems No fevers, chills, nausea, muscle aches, no difficulty breathing, no calf pain, no chest pain or shortness of breath.   Physical Exam  GENERAL APPEARANCE: Alert, conversant. Appropriately groomed. No acute distress.   VASCULAR: Pedal pulses palpable DP and PT bilateral.  Capillary refill time is immediate to all digits,  Proximal to distal cooling it warm to warm.  Digital perfusion adequate.   NEUROLOGIC: sensation is intact epicritically and protectively to 5.07 monofilament at 5/5 sites bilateral.  Light touch is intact bilateral, vibratory sensation intact bilateral, achilles tendon reflex is intact bilateral.   MUSCULOSKELETAL: acceptable muscle strength, tone and stability bilateral.  No gross boney pedal deformities noted.  No pain, crepitus or limitation noted with foot and ankle range of motion bilateral.   DERMATOLOGIC: skin is warm, supple, and dry. Well circumscribed lesions present right sub hallux ip joint, and left hallux ip joint plantarly and sub metatarsal 5 plantarly as well.  Pain with direct pressure noted.  No drainage present.  Lesions are wel circumscribed with a waxy plug noted x 3.    Assessment   Porokeratosis x 3  Plan  Pared lesions with a 15 blade without complication.  Recommended routine  debridement and padding to help with pain.

## 2019-08-18 ENCOUNTER — Ambulatory Visit: Payer: Medicaid Other | Admitting: Podiatrist

## 2019-09-26 ENCOUNTER — Other Ambulatory Visit: Payer: Self-pay

## 2019-09-26 ENCOUNTER — Ambulatory Visit
Admission: EM | Admit: 2019-09-26 | Discharge: 2019-09-26 | Disposition: A | Discharge status: Home / Self Care | Payer: Medicaid Other | Attending: Emergency Medicine | Admitting: Emergency Medicine

## 2019-09-26 DIAGNOSIS — Z20822 Contact with and (suspected) exposure to covid-19: Secondary | ICD-10-CM

## 2019-09-26 DIAGNOSIS — Z1152 Encounter for screening for COVID-19: Secondary | ICD-10-CM

## 2019-09-26 DIAGNOSIS — R05 Cough: Secondary | ICD-10-CM | POA: Diagnosis not present

## 2019-09-26 DIAGNOSIS — R059 Cough, unspecified: Secondary | ICD-10-CM

## 2019-09-26 MED ORDER — ALBUTEROL SULFATE HFA 108 (90 BASE) MCG/ACT IN AERS: 2.0000 | INHALATION_SPRAY | RESPIRATORY_TRACT | 0 refills | Status: AC | PRN

## 2019-09-26 MED ORDER — CETIRIZINE HCL 10 MG PO TABS: 10.0000 mg | ORAL_TABLET | Freq: Every day | ORAL | 0 refills | Status: DC

## 2019-09-26 MED ORDER — ACETAMINOPHEN 325 MG PO TABS
975.0000 mg | ORAL_TABLET | Freq: Once | ORAL | Status: AC
  Administered 2019-09-26: 975 mg via ORAL

## 2019-09-26 MED ORDER — PREDNISONE 20 MG PO TABS: 20.0000 mg | ORAL_TABLET | Freq: Every day | ORAL | 0 refills | Status: DC

## 2019-09-26 MED ORDER — BENZONATATE 100 MG PO CAPS: 100.0000 mg | ORAL_CAPSULE | Freq: Three times a day (TID) | ORAL | 0 refills | Status: DC

## 2019-09-26 MED ORDER — FLUTICASONE PROPIONATE 50 MCG/ACT NA SUSP: 1.0000 | Freq: Every day | NASAL | 0 refills | Status: AC

## 2019-09-26 NOTE — Discharge Instructions (Signed)

## 2019-09-26 NOTE — ED Provider Notes (Signed)
[facility]    CSN: [id number] Arrival date & time: 09/26/19  1356      History   Chief Complaint Chief Complaint  Patient presents with  . Nasal Congestion    HPI Tina Riggs is a 39 y.o. female  Presenting for weeklong course of generalized fatigue, headaches, vomiting, diarrhea, mildly productive cough, nasal congestion, and myalgias.  States symptoms have been stable over the last week.  Unsure if she came in contact with anybody with Covid, though is concerned thereof given loss of taste/smell last few days.  Past Medical History:  Diagnosis Date  . Diverticulitis    hospitalized 04/13/2014; hospitalized 04/29/2014  . HPV in female   . Vaginal Pap smear, abnormal     Patient Active Problem List   Diagnosis Date Noted  . History of cesarean section 05/02/2017  . BMI 40.0-44.9, adult ([place]) 04/11/2017  . 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 Diverticulitis of colon with perforation 04/13/2014  . Tobacco use 04/13/2014    Past Surgical History:  Procedure Laterality Date  . CESAREAN SECTION  02/2006  . CESAREAN SECTION N/A 10/17/2017   Procedure: REPEAT CESAREAN SECTION;  Surgeon: Caren Macadam, MD;  Location: [facility];  Service: Obstetrics;  Laterality: N/A;  . COLON SURGERY    . COLONOSCOPY N/A 06/11/2015   Procedure: COLONOSCOPY;  Surgeon: Leighton Ruff, MD;  Location: [facility];  Service: General;  Laterality: N/A;  . COLOSTOMY N/A 05/18/2014   Procedure: COLOSTOMY;  Surgeon: Georganna Skeans, MD;  Location: [facility];  Service: General;  Laterality: N/A;  . COLOSTOMY REVERSAL  06/2015   robotic assisted colostomy reversal with LOA/notes 06/16/2015  . COLOSTOMY REVISION N/A 05/18/2014   Procedure: COLON RESECTION SIGMOID;  Surgeon: Georganna Skeans, MD;  Location: [facility];  Service: General;  Laterality: N/A;  . COLPOSCOPY  Sep 2012  . LAPAROSCOPIC LYSIS OF ADHESIONS N/A 05/18/2014   Procedure: LAPAROSCOPIC MOBILIZATION OF SPLENIC FLEXURE;   Surgeon: Georganna Skeans, MD;  Location: [place] [facility];  Service: General;  Laterality: N/A;    OB History    Gravida  2   Para  1   Term  1   Preterm      AB      Living  1     SAB      TAB      Ectopic      Multiple      Live Births  1            Home Medications    Prior to Admission medications   Medication Sig Start Date End Date Taking? Authorizing Provider  albuterol (VENTOLIN HFA) 108 (90 Base) MCG/ACT inhaler Inhale 2 puffs into the lungs every 4 (four) hours as needed for wheezing or shortness of breath. 09/26/19   Hall-Potvin, [place], PA-C  benzonatate (TESSALON) 100 MG capsule Take 1 capsule (100 mg total) by mouth every 8 (eight) hours. 09/26/19   Hall-Potvin, [place], PA-C  cetirizine (ZYRTEC ALLERGY) 10 MG tablet Take 1 tablet (10 mg total) by mouth daily. 09/26/19   Hall-Potvin, [place], PA-C  etonogestrel (NEXPLANON) 68 MG IMPL implant 1 each by Subdermal route once. 5 years    [provider]  fluticasone (FLONASE) 50 MCG/ACT nasal spray Place 1 spray into both nostrils daily. 09/26/19   Hall-Potvin, [place], PA-C  predniSONE (DELTASONE) 20 MG tablet Take 1 tablet (20 mg total) by mouth daily. 09/26/19   Hall-Potvin, [place], PA-C    Family History Family History  Problem Relation Age of Onset  . HIV Mother   . Hypertension Mother     Social History Social History   Tobacco Use  . Smoking status: Current Every Day Smoker    Packs/day: 0.50    Years: 18.00    Pack years: 9.00    Types: Cigarettes  . Smokeless tobacco: Never Used  Vaping Use  . Vaping Use: Former  Substance Use Topics  . Alcohol use: Yes    Alcohol/week: 0.0 standard drinks    Comment: wine  . Drug use: No     Allergies   Penicillins and Robaxin [methocarbamol]   Review of Systems As per HPI   Physical Exam Triage Vital Signs ED Triage Vitals [09/26/19 1407]  Enc Vitals Group     BP (!) 157/121     Pulse Rate (!) 101     Resp 18     Temp (!) 100.7  F (38.2 C)     Temp Source Oral     SpO2 96 %     Weight      Height      Head Circumference      Peak Flow      Pain Score 7     Pain Loc      Pain Edu?      Excl. in [place]?    No data found.  Updated Vital Signs BP (!) 157/121 (BP Location: Left Arm)   Pulse (!) 101   Temp (!) 100.7 F (38.2 C) (Oral)   Resp 18   SpO2 96%   Breastfeeding No   Visual Acuity Right Eye Distance:   Left Eye Distance:   Bilateral Distance:    Right Eye Near:   Left Eye Near:    Bilateral Near:     Physical Exam Constitutional:      General: She is not in acute distress.    Appearance: She is obese. She is ill-appearing. She is not toxic-appearing or diaphoretic.  HENT:     Head: Normocephalic and atraumatic.     Mouth/Throat:     Mouth: Mucous membranes are moist.     Pharynx: Oropharynx is clear. No oropharyngeal exudate or posterior oropharyngeal erythema.  Eyes:     General: No scleral icterus.    Conjunctiva/sclera: Conjunctivae normal.     Pupils: Pupils are equal, round, and reactive to light.  Neck:     Comments: Trachea midline, negative JVD Cardiovascular:     Rate and Rhythm: Normal rate and regular rhythm.     Heart sounds: No murmur heard.  No gallop.      Comments: HR 94 at bedside Pulmonary:     Effort: Pulmonary effort is normal. No respiratory distress.     Breath sounds: No wheezing, rhonchi or rales.  Musculoskeletal:     Cervical back: Neck supple. No tenderness.  Lymphadenopathy:     Cervical: No cervical adenopathy.  Skin:    Capillary Refill: Capillary refill takes less than 2 seconds.     Coloration: Skin is not jaundiced or pale.     Findings: No rash.  Neurological:     General: No focal deficit present.     Mental Status: She is alert and oriented to person, place, and time.      UC Treatments / Results  Labs (all labs ordered are listed, but only abnormal results are displayed) Labs Reviewed  NOVEL CORONAVIRUS, NAA     EKG   Radiology No results found.  Procedures  Procedures (including critical care time)  Medications Ordered in UC Medications  acetaminophen (TYLENOL) tablet 975 mg (975 mg Oral Given 09/26/19 1448)    Initial Impression / Assessment and Plan / UC Course  I have reviewed the triage vital signs and the nursing notes.  Pertinent labs & imaging results that were available during my care of the patient were reviewed by me and considered in my medical decision making (see chart for details).     Patient afebrile, nontoxic, with SpO2 96%.  Covid PCR pending.  Patient to quarantine until results are back.  We will treat supportively as outlined below.  Return precautions discussed, patient verbalized understanding and is agreeable to plan. Final Clinical Impressions(s) / UC Diagnoses   Final diagnoses:  Encounter for screening for COVID-19  Suspected COVID-19 virus infection  Cough     Discharge Instructions     Tessalon for cough. Start flonase, atrovent nasal spray for nasal congestion/drainage. You can use over the counter nasal saline rinse such as neti pot for nasal congestion. Keep hydrated, your urine should be clear to pale yellow in color. Tylenol/motrin for fever and pain. Monitor for any worsening of symptoms, chest pain, shortness of breath, wheezing, swelling of the throat, go to the emergency department for further evaluation needed.     ED Prescriptions    Medication Sig Dispense Auth. Provider   cetirizine (ZYRTEC ALLERGY) 10 MG tablet Take 1 tablet (10 mg total) by mouth daily. 30 tablet Hall-Potvin, [place], PA-C   fluticasone (FLONASE) 50 MCG/ACT nasal spray Place 1 spray into both nostrils daily. 16 g Hall-Potvin, [place], PA-C   benzonatate (TESSALON) 100 MG capsule Take 1 capsule (100 mg total) by mouth every 8 (eight) hours. 21 capsule Hall-Potvin, [place], PA-C   predniSONE (DELTASONE) 20 MG tablet Take 1 tablet (20 mg total) by mouth daily. 5  tablet Hall-Potvin, [place], PA-C   albuterol (VENTOLIN HFA) 108 (90 Base) MCG/ACT inhaler Inhale 2 puffs into the lungs every 4 (four) hours as needed for wheezing or shortness of breath. 18 g Hall-Potvin, [place], PA-C     PDMP not reviewed this encounter.   Hall-Potvin, [place], [place] 09/26/19 1509

## 2019-09-26 NOTE — ED Triage Notes (Signed)
Pt c/o headache, fatigue, vomiting, diarrhea, cough, congestion, body aches, chills, and loss of taste/smell x1wk.

## 2019-09-27 LAB — SARS-COV-2, NAA 2 DAY TAT

## 2019-09-27 LAB — NOVEL CORONAVIRUS, NAA: SARS-CoV-2, NAA: DETECTED — AB

## 2019-09-28 ENCOUNTER — Other Ambulatory Visit: Payer: Self-pay | Admitting: Physician Assistant

## 2019-09-28 DIAGNOSIS — F17200 Nicotine dependence, unspecified, uncomplicated: Secondary | ICD-10-CM

## 2019-09-28 DIAGNOSIS — Z6841 Body Mass Index (BMI) 40.0 and over, adult: Secondary | ICD-10-CM

## 2019-09-28 DIAGNOSIS — U071 COVID-19: Secondary | ICD-10-CM

## 2019-09-28 NOTE — Progress Notes (Signed)
I connected by phone with Lenor Coffin on 09/28/2019 at 10:10 AM to discuss the potential use of a new treatment for mild to moderate COVID-19 viral infection in non-hospitalized patients.  This patient is a 39 y.o. female that meets the FDA criteria for Emergency Use Authorization of COVID monoclonal antibody casirivimab/imdevimab or bamlanivimab/eteseviamb.  Has a (+) direct SARS-CoV-2 viral test result  Has mild or moderate COVID-19   Is NOT hospitalized due to COVID-19  Is within 10 days of symptom onset  Has at least one of the high risk factor(s) for progression to severe COVID-19 and/or hospitalization as defined in EUA.  Specific high risk criteria : BMI > 25 and Other high risk medical condition per CDC:  SVI 4, smoker   I have spoken and communicated the following to the patient or parent/caregiver regarding COVID monoclonal antibody treatment:  1. FDA has authorized the emergency use for the treatment of mild to moderate COVID-19 in adults and pediatric patients with positive results of direct SARS-CoV-2 viral testing who are 45 years of age and older weighing at least 40 kg, and who are at high risk for progressing to severe COVID-19 and/or hospitalization.  2. The significant known and potential risks and benefits of COVID monoclonal antibody, and the extent to which such potential risks and benefits are unknown.  3. Information on available alternative treatments and the risks and benefits of those alternatives, including clinical trials.  4. Patients treated with COVID monoclonal antibody should continue to self-isolate and use infection control measures (e.g., wear mask, isolate, social distance, avoid sharing personal items, clean and disinfect "high touch" surfaces, and frequent handwashing) according to CDC guidelines.   5. The patient or parent/caregiver has the option to accept or refuse COVID monoclonal antibody treatment.  After reviewing this information with the  patient, the patient has agreed to receive one of the available covid 19 monoclonal antibodies and will be provided an appropriate fact sheet prior to infusion. Tami Lin Codie Krogh, [place] 09/28/2019 10:10 AM

## 2019-09-29 ENCOUNTER — Ambulatory Visit (HOSPITAL_COMMUNITY)
Admission: RE | Admit: 2019-09-29 | Discharge: 2019-09-29 | Disposition: A | Discharge status: Home / Self Care | Payer: Medicaid Other | Source: Ambulatory Visit | Attending: Pulmonary Disease | Admitting: Pulmonary Disease

## 2019-09-29 DIAGNOSIS — F17200 Nicotine dependence, unspecified, uncomplicated: Secondary | ICD-10-CM | POA: Diagnosis present

## 2019-09-29 DIAGNOSIS — Z6841 Body Mass Index (BMI) 40.0 and over, adult: Secondary | ICD-10-CM | POA: Diagnosis present

## 2019-09-29 DIAGNOSIS — U071 COVID-19: Secondary | ICD-10-CM | POA: Diagnosis not present

## 2019-09-29 MED ORDER — DIPHENHYDRAMINE HCL 50 MG/ML IJ SOLN: 50.0000 mg | Freq: Once | INTRAMUSCULAR | Status: DC | PRN

## 2019-09-29 MED ORDER — EPINEPHRINE 0.3 MG/0.3ML IJ SOAJ: 0.3000 mg | Freq: Once | INTRAMUSCULAR | Status: DC | PRN

## 2019-09-29 MED ORDER — METHYLPREDNISOLONE SODIUM SUCC 125 MG IJ SOLR: 125.0000 mg | Freq: Once | INTRAMUSCULAR | Status: DC | PRN

## 2019-09-29 MED ORDER — SODIUM CHLORIDE 0.9 % IV SOLN
1200.0000 mg | Freq: Once | INTRAVENOUS | Status: AC
  Administered 2019-09-29: 1200 mg via INTRAVENOUS

## 2019-09-29 MED ORDER — SODIUM CHLORIDE 0.9 % IV SOLN: INTRAVENOUS | Status: DC | PRN

## 2019-09-29 MED ORDER — FAMOTIDINE IN NACL 20-0.9 MG/50ML-% IV SOLN: 20.0000 mg | Freq: Once | INTRAVENOUS | Status: DC | PRN

## 2019-09-29 MED ORDER — ALBUTEROL SULFATE HFA 108 (90 BASE) MCG/ACT IN AERS: 2.0000 | INHALATION_SPRAY | Freq: Once | RESPIRATORY_TRACT | Status: DC | PRN

## 2019-09-29 NOTE — Progress Notes (Signed)
  Diagnosis: COVID-19  Physician: Asencion Noble  Procedure: Covid Infusion Clinic Med: casirivimab\imdevimab infusion - Provided patient with casirivimab\imdevimab fact sheet for patients, parents and caregivers prior to infusion.  Complications: No immediate complications noted.  Discharge: Discharged home   [place] 09/29/2019

## 2019-09-29 NOTE — Discharge Instructions (Signed)

## 2020-03-23 ENCOUNTER — Other Ambulatory Visit: Payer: Self-pay

## 2020-03-23 ENCOUNTER — Emergency Department (HOSPITAL_COMMUNITY)
Admission: EM | Admit: 2020-03-23 | Discharge: 2020-03-23 | Disposition: A | Discharge status: Home / Self Care | Payer: Medicaid Other | Attending: Emergency Medicine | Admitting: Emergency Medicine

## 2020-03-23 ENCOUNTER — Encounter (HOSPITAL_COMMUNITY): Payer: Self-pay | Admitting: Emergency Medicine

## 2020-03-23 ENCOUNTER — Emergency Department (HOSPITAL_COMMUNITY): Payer: Medicaid Other

## 2020-03-23 DIAGNOSIS — S0990XA Unspecified injury of head, initial encounter: Secondary | ICD-10-CM | POA: Diagnosis present

## 2020-03-23 DIAGNOSIS — Z79899 Other long term (current) drug therapy: Secondary | ICD-10-CM | POA: Diagnosis not present

## 2020-03-23 DIAGNOSIS — R519 Headache, unspecified: Secondary | ICD-10-CM | POA: Diagnosis not present

## 2020-03-23 DIAGNOSIS — S022XXA Fracture of nasal bones, initial encounter for closed fracture: Secondary | ICD-10-CM | POA: Insufficient documentation

## 2020-03-23 DIAGNOSIS — F17210 Nicotine dependence, cigarettes, uncomplicated: Secondary | ICD-10-CM | POA: Insufficient documentation

## 2020-03-23 DIAGNOSIS — S01511A Laceration without foreign body of lip, initial encounter: Secondary | ICD-10-CM | POA: Insufficient documentation

## 2020-03-23 DIAGNOSIS — S0083XA Contusion of other part of head, initial encounter: Secondary | ICD-10-CM | POA: Insufficient documentation

## 2020-03-23 MED ORDER — OXYCODONE-ACETAMINOPHEN 5-325 MG PO TABS
1.0000 | ORAL_TABLET | Freq: Once | ORAL | Status: AC
  Administered 2020-03-23: 1 via ORAL
  Filled 2020-03-23: qty 1

## 2020-03-23 MED ORDER — OXYCODONE-ACETAMINOPHEN 5-325 MG PO TABS: 1.0000 | ORAL_TABLET | ORAL | Status: DC | PRN

## 2020-03-23 MED ORDER — KETOROLAC TROMETHAMINE 15 MG/ML IJ SOLN
30.0000 mg | Freq: Once | INTRAMUSCULAR | Status: AC
  Administered 2020-03-23: 30 mg via INTRAMUSCULAR
  Filled 2020-03-23: qty 2

## 2020-03-23 MED ORDER — TIZANIDINE HCL 4 MG PO TABS: 4.0000 mg | ORAL_TABLET | Freq: Four times a day (QID) | ORAL | 0 refills | Status: DC | PRN

## 2020-03-23 MED ORDER — ONDANSETRON 4 MG PO TBDP
4.0000 mg | ORAL_TABLET | Freq: Once | ORAL | Status: AC
  Administered 2020-03-23: 4 mg via ORAL
  Filled 2020-03-23: qty 1

## 2020-03-23 NOTE — ED Notes (Signed)
I tired to help pt clean up give pt clean blankets.Pt told me not ask her questions and called me out my name.

## 2020-03-23 NOTE — ED Notes (Signed)
Pt verbalizes understanding of discharge instructions. Opportunity for questions and answers were provided. Pt discharged from the ED.   ?

## 2020-03-23 NOTE — ED Notes (Signed)
SANE at bedside
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
## 2020-03-23 NOTE — ED Notes (Signed)
Went to assist pt with positioning. Pt was very tearful. Pt is resting comfortably in bed.

## 2021-05-25 ENCOUNTER — Encounter: Payer: Self-pay | Admitting: Emergency Medicine

## 2021-05-25 ENCOUNTER — Ambulatory Visit
Admission: EM | Admit: 2021-05-25 | Discharge: 2021-05-25 | Disposition: A | Discharge status: Home / Self Care | Payer: BC Managed Care – PPO | Attending: Internal Medicine | Admitting: Internal Medicine

## 2021-05-25 DIAGNOSIS — R1084 Generalized abdominal pain: Secondary | ICD-10-CM | POA: Diagnosis not present

## 2021-05-25 DIAGNOSIS — N3001 Acute cystitis with hematuria: Secondary | ICD-10-CM | POA: Insufficient documentation

## 2021-05-25 DIAGNOSIS — Z113 Encounter for screening for infections with a predominantly sexual mode of transmission: Secondary | ICD-10-CM | POA: Insufficient documentation

## 2021-05-25 LAB — POCT URINALYSIS DIP (MANUAL ENTRY)
Bilirubin, UA: NEGATIVE
Glucose, UA: NEGATIVE mg/dL
Ketones, POC UA: NEGATIVE mg/dL
Nitrite, UA: NEGATIVE
Spec Grav, UA: 1.025 (ref 1.010–1.025)
Urobilinogen, UA: 1 E.U./dL
pH, UA: 7 (ref 5.0–8.0)

## 2021-05-25 LAB — POCT URINE PREGNANCY: Preg Test, Ur: NEGATIVE

## 2021-05-25 MED ORDER — NITROFURANTOIN MONOHYD MACRO 100 MG PO CAPS: 100.0000 mg | ORAL_CAPSULE | Freq: Two times a day (BID) | ORAL | 0 refills | Status: DC
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
## 2021-05-25 NOTE — Discharge Instructions (Signed)
You are being treated with an antibiotic for possible urinary tract infection.  It is also possible that you may have a vaginal infection.  Vaginal swab, blood work, urine culture all pending.  We will call if they are positive.  Please refrain from sexual activity until test results and treatment are complete.

## 2021-05-25 NOTE — ED Triage Notes (Signed)
Patient c/o generalized abdominal pain off and on for one week.  Patient is having some cramping and nausea.  Pt did break out in hives in the last couple of days.  Pt has taken Ibuprofen OTC.

## 2021-05-26 LAB — URINE CULTURE: Culture: <10000 — AB

## 2021-05-26 LAB — HIV ANTIBODY (ROUTINE TESTING W REFLEX): HIV Screen 4th Generation wRfx: NONREACTIVE

## 2021-05-26 LAB — RPR: RPR Ser Ql: NONREACTIVE

## 2021-05-27 ENCOUNTER — Telehealth (HOSPITAL_COMMUNITY): Payer: Self-pay | Admitting: Emergency Medicine

## 2021-05-27 LAB — CERVICOVAGINAL ANCILLARY ONLY
Bacterial Vaginitis (gardnerella): POSITIVE — AB
Candida Glabrata: NEGATIVE
Candida Vaginitis: NEGATIVE
Chlamydia: NEGATIVE
Comment: NEGATIVE
Comment: NEGATIVE
Comment: NEGATIVE
Comment: NEGATIVE
Comment: NEGATIVE
Comment: NORMAL
Neisseria Gonorrhea: NEGATIVE
Trichomonas: POSITIVE — AB

## 2021-05-27 MED ORDER — METRONIDAZOLE 500 MG PO TABS: 500.0000 mg | ORAL_TABLET | Freq: Two times a day (BID) | ORAL | 0 refills | Status: DC

## 2021-06-14 ENCOUNTER — Other Ambulatory Visit: Payer: Self-pay | Admitting: Surgery

## 2021-06-14 DIAGNOSIS — K432 Incisional hernia without obstruction or gangrene: Secondary | ICD-10-CM

## 2021-06-23 ENCOUNTER — Inpatient Hospital Stay: Admission: RE | Admit: 2021-06-23 | Payer: Medicaid Other | Source: Ambulatory Visit

## 2021-06-23 ENCOUNTER — Ambulatory Visit
Admission: RE | Admit: 2021-06-23 | Discharge: 2021-06-23 | Disposition: A | Discharge status: Home / Self Care | Payer: BC Managed Care – PPO | Source: Ambulatory Visit | Attending: Surgery | Admitting: Surgery

## 2021-06-23 DIAGNOSIS — K432 Incisional hernia without obstruction or gangrene: Secondary | ICD-10-CM

## 2021-06-23 MED ORDER — IOPAMIDOL (ISOVUE-300) INJECTION 61%
100.0000 mL | Freq: Once | INTRAVENOUS | Status: AC | PRN
Start: 2021-06-23 — End: 2021-06-23
  Administered 2021-06-23: 100 mL via INTRAVENOUS

## 2021-11-05 ENCOUNTER — Emergency Department (HOSPITAL_COMMUNITY): Payer: BC Managed Care – PPO

## 2021-11-05 ENCOUNTER — Other Ambulatory Visit: Payer: Self-pay

## 2021-11-05 ENCOUNTER — Encounter (HOSPITAL_COMMUNITY): Payer: Self-pay | Admitting: *Deleted

## 2021-11-05 ENCOUNTER — Emergency Department (HOSPITAL_COMMUNITY)
Admission: EM | Admit: 2021-11-05 | Discharge: 2021-11-05 | Disposition: A | Discharge status: Home / Self Care | Payer: BC Managed Care – PPO | Attending: Emergency Medicine | Admitting: Emergency Medicine

## 2021-11-05 DIAGNOSIS — R1084 Generalized abdominal pain: Secondary | ICD-10-CM

## 2021-11-05 DIAGNOSIS — M545 Low back pain, unspecified: Secondary | ICD-10-CM | POA: Diagnosis present

## 2021-11-05 DIAGNOSIS — K5730 Diverticulosis of large intestine without perforation or abscess without bleeding: Secondary | ICD-10-CM | POA: Diagnosis not present

## 2021-11-05 DIAGNOSIS — Z1152 Encounter for screening for COVID-19: Secondary | ICD-10-CM | POA: Diagnosis not present

## 2021-11-05 DIAGNOSIS — K5790 Diverticulosis of intestine, part unspecified, without perforation or abscess without bleeding: Secondary | ICD-10-CM

## 2021-11-05 DIAGNOSIS — Z79899 Other long term (current) drug therapy: Secondary | ICD-10-CM | POA: Diagnosis not present

## 2021-11-05 LAB — CBC WITH DIFFERENTIAL/PLATELET
Abs Immature Granulocytes: 0.03 10*3/uL (ref 0.00–0.07)
Basophils Absolute: 0 10*3/uL (ref 0.0–0.1)
Basophils Relative: 0 %
Eosinophils Absolute: 0.2 10*3/uL (ref 0.0–0.5)
Eosinophils Relative: 2 %
HCT: 43.1 % (ref 36.0–46.0)
Hemoglobin: 13.8 g/dL (ref 12.0–15.0)
Immature Granulocytes: 0 %
Lymphocytes Relative: 27 %
Lymphs Abs: 2.4 10*3/uL (ref 0.7–4.0)
MCH: 29.7 pg (ref 26.0–34.0)
MCHC: 32 g/dL (ref 30.0–36.0)
MCV: 92.7 fL (ref 80.0–100.0)
Monocytes Absolute: 0.5 10*3/uL (ref 0.1–1.0)
Monocytes Relative: 6 %
Neutro Abs: 5.8 10*3/uL (ref 1.7–7.7)
Neutrophils Relative %: 65 %
Platelets: 292 10*3/uL (ref 150–400)
RBC: 4.65 MIL/uL (ref 3.87–5.11)
RDW: 14.9 % (ref 11.5–15.5)
WBC: 8.9 10*3/uL (ref 4.0–10.5)
nRBC: 0 % (ref 0.0–0.2)

## 2021-11-05 LAB — URINALYSIS, ROUTINE W REFLEX MICROSCOPIC
Bacteria, UA: NONE SEEN
Bilirubin Urine: NEGATIVE
Glucose, UA: NEGATIVE mg/dL
Ketones, ur: NEGATIVE mg/dL
Leukocytes,Ua: NEGATIVE
Nitrite: NEGATIVE
Protein, ur: NEGATIVE mg/dL
Specific Gravity, Urine: 1.018 (ref 1.005–1.030)
pH: 7 (ref 5.0–8.0)

## 2021-11-05 LAB — RESP PANEL BY RT-PCR (RSV, FLU A&B, COVID)  RVPGX2
Influenza A by PCR: NEGATIVE
Influenza B by PCR: NEGATIVE
Resp Syncytial Virus by PCR: NEGATIVE
SARS Coronavirus 2 by RT PCR: NEGATIVE

## 2021-11-05 LAB — COMPREHENSIVE METABOLIC PANEL
ALT: 16 U/L (ref 0–44)
AST: 22 U/L (ref 15–41)
Albumin: 3.2 g/dL — ABNORMAL LOW (ref 3.5–5.0)
Alkaline Phosphatase: 99 U/L (ref 38–126)
Anion gap: 8 (ref 5–15)
BUN: 9 mg/dL (ref 6–20)
CO2: 26 mmol/L (ref 22–32)
Calcium: 8.5 mg/dL — ABNORMAL LOW (ref 8.9–10.3)
Chloride: 103 mmol/L (ref 98–111)
Creatinine, Ser: 0.79 mg/dL (ref 0.44–1.00)
GFR, Estimated: >60 mL/min (ref >60)
Glucose, Bld: 114 mg/dL — ABNORMAL HIGH (ref 70–99)
Potassium: 3.6 mmol/L (ref 3.5–5.1)
Sodium: 137 mmol/L (ref 135–145)
Total Bilirubin: 0.5 mg/dL (ref 0.3–1.2)
Total Protein: 6.8 g/dL (ref 6.5–8.1)

## 2021-11-05 LAB — PREGNANCY, URINE: Preg Test, Ur: NEGATIVE

## 2021-11-05 MED ORDER — CIPROFLOXACIN HCL 500 MG PO TABS: 500.0000 mg | ORAL_TABLET | Freq: Two times a day (BID) | ORAL | 0 refills | Status: AC

## 2021-11-05 MED ORDER — METRONIDAZOLE 500 MG PO TABS: 500.0000 mg | ORAL_TABLET | Freq: Two times a day (BID) | ORAL | 0 refills | Status: AC

## 2021-11-05 MED ORDER — IBUPROFEN 200 MG PO TABS
600.0000 mg | ORAL_TABLET | Freq: Once | ORAL | Status: AC
Start: 2021-11-05 — End: 2021-11-05
  Administered 2021-11-05: 600 mg via ORAL
  Filled 2021-11-05: qty 3

## 2021-11-05 MED ORDER — LIDOCAINE 5 % EX PTCH
1.0000 | MEDICATED_PATCH | CUTANEOUS | Status: DC
  Administered 2021-11-05: 1 via TRANSDERMAL
  Filled 2021-11-05: qty 1

## 2021-11-05 MED ORDER — CYCLOBENZAPRINE HCL 10 MG PO TABS: 10.0000 mg | ORAL_TABLET | Freq: Three times a day (TID) | ORAL | 0 refills | Status: DC | PRN

## 2021-11-05 NOTE — Discharge Instructions (Addendum)
Follow up with your Physician for recheck this week

## 2021-11-05 NOTE — ED Provider Triage Note (Signed)
Emergency Medicine Provider Triage Evaluation Note  DALICIA KISNER , a 41 y.o. female  was evaluated in triage.  Patient has a myriad of complaints.  Patient endorsing 2-week history of left-sided low back pain that occurred after awakening from sleep.  She denies fever, weakness/sensory deficit lower extremities, saddle anesthesia urinary retention, bowel dysfunction, fever, history of IV drug use, no malignancy or chronic steroid use.  She states she has noticed some urinary frequency that is occurred over the past 3 to 4 weeks without feelings of dysuria or hematuria.  States she has been taking acetaminophen, Aleve and aspirin at home with minimal to no relief of symptoms.  She also endorses cough, nasal congestion, fatigue, diarrhea.  Review of Systems  Positive: See above Negative:   Physical Exam  BP (!) 155/102 (BP Location: Left Arm)   Pulse 88   Temp 98.2 F (36.8 C) (Oral)   Resp 16   Ht _0  (1.676 m)   Wt (!) 139.3 kg   LMP 10/16/2021 (Exact Date)   SpO2 99%   BMI 49.55 kg/m  Gen:   Awake, no distress   Resp:  Normal effort  MSK:   Moves extremities without difficulty  Other:    Medical Decision Making  Medically screening exam initiated at 3:52 PM.  Appropriate orders placed.  SECRET KRISTENSEN was informed that the remainder of the evaluation will be completed by another provider, this initial triage assessment does not replace that evaluation, and the importance of remaining in the ED until their evaluation is complete.     Wilnette Kales, [place] 11/05/21 [phone]

## 2021-11-05 NOTE — ED Provider Notes (Signed)
Tina Riggs Provider Note   CSN: [id number] Arrival date & time: 11/05/21  1451     History  Chief Complaint  Patient presents with   Back Pain    Tina Riggs is a 41 y.o. female.  Patient complains of pain in her left low back.  Patient reports she has been experiencing some discomfort with urination.  Patient reports she has had a cough and congestion.  Patient reports she has past medical history of diverticulitis and has some abdominal discomfort.  Patient reports this pain feels similar to diverticulitis in her abdomen.  She reports she has similar pain in her back when she has had diverticulitis exacerbations.  The history is provided by the patient. No language interpreter was used.  Back Pain Location:  Lumbar spine Quality:  Aching Radiates to:  Does not radiate      Home Medications Prior to Admission medications   Medication Sig Start Date End Date Taking? Authorizing Provider  albuterol (VENTOLIN HFA) 108 (90 Base) MCG/ACT inhaler Inhale 2 puffs into the lungs every 4 (four) hours as needed for wheezing or shortness of breath. Patient not taking: Reported on 03/23/2020 09/26/19   Hall-Potvin, [place], PA-C  amLODipine (NORVASC) 10 MG tablet Take 10 mg by mouth daily. 03/16/20   [provider]  cetirizine (ZYRTEC ALLERGY) 10 MG tablet Take 1 tablet (10 mg total) by mouth daily. Patient not taking: Reported on 03/23/2020 09/26/19   Hall-Potvin, [place], PA-C  etonogestrel (NEXPLANON) 68 MG IMPL implant 1 each by Subdermal route once. 5 years    [provider]  fluticasone (FLONASE) 50 MCG/ACT nasal spray Place 1 spray into both nostrils daily. Patient not taking: Reported on 03/23/2020 09/26/19   Hall-Potvin, [place], PA-C  metroNIDAZOLE (FLAGYL) 500 MG tablet Take 1 tablet (500 mg total) by mouth 2 (two) times daily. 05/27/21   Lamptey, Myrene Galas, MD  nitrofurantoin, macrocrystal-monohydrate, (MACROBID) 100 MG  capsule Take 1 capsule (100 mg total) by mouth 2 (two) times daily. 05/25/21   Teodora Medici, FNP  tiZANidine (ZANAFLEX) 4 MG tablet Take 1 tablet (4 mg total) by mouth every 6 (six) hours as needed for muscle spasms. 03/23/20   Tedd Sias, PA      Allergies    Penicillins and Robaxin [methocarbamol]    Review of Systems   Review of Systems  Respiratory:  Positive for cough.   Musculoskeletal:  Positive for back pain.  All other systems reviewed and are negative.   Physical Exam Updated Vital Signs BP (!) 161/100 (BP Location: Right Arm)   Pulse 88   Temp 98.2 F (36.8 C) (Oral)   Resp 18   Ht '5\' 6"'$  (1.676 m)   Wt (!) 139.3 kg   LMP 10/16/2021 (Exact Date)   SpO2 100%   BMI 49.55 kg/m  Physical Exam Vitals and nursing note reviewed.  Constitutional:      Appearance: She is well-developed.  HENT:     Head: Normocephalic.     Nose: Nose normal.  Eyes:     Pupils: Pupils are equal, round, and reactive to light.  Cardiovascular:     Rate and Rhythm: Normal rate and regular rhythm.  Pulmonary:     Effort: Pulmonary effort is normal.  Abdominal:     General: Abdomen is flat. There is no distension.  Musculoskeletal:        General: Normal range of motion.     Cervical back: Normal range of motion.  Skin:    General: Skin is warm.  Neurological:     General: No focal deficit present.     Mental Status: She is alert and oriented to person, place, and time.  Psychiatric:        Mood and Affect: Mood normal.     ED Results / Procedures / Treatments   Labs (all labs ordered are listed, but only abnormal results are displayed) Labs Reviewed  URINALYSIS, ROUTINE W REFLEX MICROSCOPIC - Abnormal; Notable for the following components:      Result Value   Hgb urine dipstick SMALL (*)    All other components within normal limits  COMPREHENSIVE METABOLIC PANEL - Abnormal; Notable for the following components:   Glucose, Bld 114 (*)    Calcium 8.5 (*)    Albumin 3.2  (*)    All other components within normal limits  RESP PANEL BY RT-PCR (RSV, FLU A&B, COVID)  [id number]  CBC WITH DIFFERENTIAL/PLATELET  PREGNANCY, URINE    EKG EKG Interpretation  Date/Time:  Saturday November 05 2021 16:34:38 EDT Ventricular Rate:  75 PR Interval:  141 QRS Duration: 85 QT Interval:  428 QTC Calculation: 479 R Axis:   34 Text Interpretation: Sinus rhythm Abnormal T, consider ischemia, anterior leads Confirmed by Dene Gentry [phone]) on 11/05/2021 5:19:12 PM  Radiology DG Chest 2 View  Result Date: 11/05/2021 CLINICAL DATA:  Cough.  Shortness of breath. EXAM: CHEST - 2 VIEW COMPARISON:  03/23/2020 chest radiograph FINDINGS: The cardiomediastinal silhouette is unremarkable. Mild elevation of the RIGHT hemidiaphragm again noted. There is no evidence of focal airspace disease, pulmonary edema, suspicious pulmonary nodule/mass, pleural effusion, or pneumothorax. No acute bony abnormalities are identified. IMPRESSION: No active cardiopulmonary disease. Electronically Signed   By: Margarette Canada M.D.   On: 11/05/2021 16:25    Procedures Procedures    Medications Ordered in ED Medications  lidocaine (LIDODERM) 5 % 1 patch (1 patch Transdermal Patch Applied 11/05/21 1700)  ibuprofen (ADVIL) tablet 600 mg (600 mg Oral Given 11/05/21 1700)    ED Course/ Medical Decision Making/ A&P                           Medical Decision Making Pt complains of pain in her low back and her abdomen.  Pt has had diverticulitis in the past   Amount and/or Complexity of Data Reviewed External Data Reviewed: notes.    Details: Hospitalist and general surgery notes reviewed  Labs: ordered. Decision-making details documented in ED Course.    Details: Covid and influenza are negative.   CBC patient has a normal white blood cell count BUN and creatinine are normal Labs ordered reviewed and interpreted Radiology: ordered and independent interpretation performed. Decision-making details  documented in ED Course.    Details: Chest x-ray shows no evidence of acute disease CT abdomen renal scan.  No evidence of kidney disease patient does have diverticulosis.  Slight stranding  Risk Prescription drug management. Risk Details: Discussed symptoms with patient she feels like she is getting ready to an exacerbation of diverticulitis patient request treatment with Flagyl and metronidazole as this is worked well for her in the past.  Patient also request a muscle relaxer for her back.  She is allergic to Robaxin I will try her on Flexeril.  She is advised to schedule follow-up with her primary care physician.           Final Clinical Impression(s) / ED Diagnoses Final diagnoses:  Acute low back pain without sciatica, unspecified back pain laterality  Generalized abdominal pain  Diverticulosis    Rx / DC Orders ED Discharge Orders          Ordered    ciprofloxacin (CIPRO) 500 MG tablet  2 times daily        11/05/21 2150    metroNIDAZOLE (FLAGYL) 500 MG tablet  2 times daily        11/05/21 2150    cyclobenzaprine (FLEXERIL) 10 MG tablet  3 times daily PRN        11/05/21 2150           An After Visit Summary was printed and given to the patient.    Sidney Ace 11/05/21 2150    Valarie Merino, MD 11/06/21 1300

## 2021-11-05 NOTE — ED Triage Notes (Addendum)
Here for multiple complaints. C/c is back pain, also endorses increased urination, fatigue, diarrhea, cough, sob, fatigue, can't sleep. Denies NV, bleeding, CP. Denies fall or injury. No relief with acetaminophen, aleve, or ASA.

## 2021-11-05 NOTE — ED Notes (Signed)
IV team at bedside 

## 2023-01-05 ENCOUNTER — Ambulatory Visit (INDEPENDENT_AMBULATORY_CARE_PROVIDER_SITE_OTHER): Payer: Medicaid Other | Admitting: Nurse Practitioner

## 2023-01-05 VITALS — BP 151/113 | HR 82 | Temp 97.1°F | Wt 282.8 lb

## 2023-01-05 DIAGNOSIS — I272 Pulmonary hypertension, unspecified: Secondary | ICD-10-CM

## 2023-01-05 DIAGNOSIS — L732 Hidradenitis suppurativa: Secondary | ICD-10-CM | POA: Diagnosis not present

## 2023-01-05 DIAGNOSIS — I10 Essential (primary) hypertension: Secondary | ICD-10-CM

## 2023-01-05 DIAGNOSIS — M5431 Sciatica, right side: Secondary | ICD-10-CM

## 2023-01-05 DIAGNOSIS — R053 Chronic cough: Secondary | ICD-10-CM

## 2023-01-05 MED ORDER — DOXYCYCLINE HYCLATE 100 MG PO TABS: 100.0000 mg | ORAL_TABLET | Freq: Two times a day (BID) | ORAL | 0 refills | Status: AC

## 2023-01-05 MED ORDER — CLINDAMYCIN PHOSPHATE 1 % EX GEL: Freq: Two times a day (BID) | CUTANEOUS | 0 refills | Status: DC

## 2023-01-05 MED ORDER — PREDNISONE 10 MG PO TABS: ORAL_TABLET | ORAL | 0 refills | Status: DC

## 2023-01-05 MED ORDER — CYCLOBENZAPRINE HCL 10 MG PO TABS: 10.0000 mg | ORAL_TABLET | Freq: Three times a day (TID) | ORAL | 0 refills | Status: DC | PRN

## 2023-01-05 MED ORDER — AMLODIPINE BESYLATE 10 MG PO TABS: 10.0000 mg | ORAL_TABLET | Freq: Every day | ORAL | 2 refills | Status: AC

## 2023-01-05 NOTE — Patient Instructions (Signed)
 1. Hidradenitis suppurativa of multiple sites (Primary)  - clindamycin  (CLINDAGEL) 1 % gel; Apply topically 2 (two) times daily.  Dispense: 30 g; Refill: 0 - doxycycline  (VIBRA -TABS) 100 MG tablet; Take 1 tablet (100 mg total) by mouth 2 (two) times daily for 10 days.  Dispense: 20 tablet; Refill: 0  2. Sciatica of right side  - predniSONE  (DELTASONE ) 10 MG tablet; Take 4 tabs for 2 days, then 3 tabs for 2 days, then 2 tabs for 2 days, then 1 tab for 2 days, then stop  Dispense: 20 tablet; Refill: 0 - cyclobenzaprine  (FLEXERIL ) 10 MG tablet; Take 1 tablet (10 mg total) by mouth 3 (three) times daily as needed for muscle spasms.  Dispense: 30 tablet; Refill: 0   Follow up:  Follow up in 1 months

## 2023-01-05 NOTE — Progress Notes (Signed)
 Subjective   Patient ID: Tina Riggs, female    DOB: [birth year], 43 y.o.   MRN: [id number]  Chief Complaint  Patient presents with   Annual Exam    Boil on left nipple per per pt    Referring provider: No ref. provider found  Tina Riggs is a 43 y.o. 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  She has been having issues with abscesses to her bilateral axilla area.  She does have an area to left nipple as well.  We will order clindamycin  gel.  We will order oral antibiotics as well.  Patient also complains of sciatica radiating down her right leg.  We will trial Flexeril  and prednisone .  Patient also complains of chronic cough.  We will check x-ray today.  We will bring patient back in a couple weeks to recheck blood pressure and complete physical with Pap. Denies f/c/s, n/v/d, hemoptysis, PND, leg swelling Denies chest pain or edema      Allergies  Allergen Reactions   Penicillins Itching, Rash and Other (See Comments)    Has patient had a PCN reaction causing immediate rash, facial/tongue/throat swelling, SOB or lightheadedness with hypotension: yes Has patient had a PCN reaction causing severe rash involving mucus membranes or skin necrosis: no Has patient had a PCN reaction that required hospitalization no Has patient had a PCN reaction occurring within the last 10 years: no If all of the above answers are NO, then may proceed with Cephalosporin use. Pt states she has tolerated Amoxicillin  in the past    Robaxin  [Methocarbamol ] Swelling and Other (See Comments)    Bottom lip swelled    Immunization History  Administered Date(s) Administered   Influenza-Unspecified 12/21/2022   Tdap 08/03/2017    Tobacco History: Social History   Tobacco Use  Smoking Status Every Day    Current packs/day: 0.50   Average packs/day: 0.5 packs/day for 18.0 years (9.0 ttl pk-yrs)   Types: Cigarettes  Smokeless Tobacco Never   Ready to quit: Yes Counseling given: Yes   Outpatient Encounter Medications as of 01/05/2023  Medication Sig   clindamycin  (CLINDAGEL) 1 % gel Apply topically 2 (two) times daily.   doxycycline  (VIBRA -TABS) 100 MG tablet Take 1 tablet (100 mg total) by mouth 2 (two) times daily for 10 days.   etonogestrel  (NEXPLANON ) 68 MG IMPL implant 1 each by Subdermal route once. 5 years   predniSONE  (DELTASONE ) 10 MG tablet Take 4 tabs for 2 days, then 3 tabs for 2 days, then 2 tabs for 2 days, then 1 tab for 2 days, then stop   albuterol  (VENTOLIN  HFA) 108 (90 Base) MCG/ACT inhaler Inhale 2 puffs into the lungs every 4 (four) hours as needed for wheezing or shortness of breath. (Patient not taking: Reported on 01/05/2023)   amLODipine  (NORVASC ) 10 MG tablet Take 1 tablet (10 mg total) by mouth daily.   cetirizine  (ZYRTEC  ALLERGY) 10 MG tablet Take 1 tablet (10 mg total) by mouth daily. (Patient not taking: Reported on 03/23/2020)   cyclobenzaprine  (FLEXERIL ) 10 MG tablet Take 1 tablet (10 mg total) by mouth 3 (three) times daily as needed for muscle spasms.   fluticasone  (FLONASE ) 50 MCG/ACT nasal spray Place 1 spray into both nostrils daily. (Patient not taking: Reported on 03/23/2020)   metroNIDAZOLE  (FLAGYL ) 500  MG tablet Take 1 tablet (500 mg total) by mouth 2 (two) times daily. (Patient not taking: Reported on 11/05/2021)   [DISCONTINUED] amLODipine  (NORVASC ) 10 MG tablet Take 10 mg by mouth daily. (Patient not taking: Reported on 11/05/2021)   [DISCONTINUED] cyclobenzaprine  (FLEXERIL ) 10 MG tablet Take 1 tablet (10 mg total) by mouth 3 (three) times daily as needed for muscle spasms. (Patient not taking: Reported on 01/05/2023)   No facility-administered encounter medications on file as of 01/05/2023.    Review of Systems  Review of Systems  Constitutional: Negative.    HENT: Negative.    Respiratory:  Positive for cough.   Cardiovascular: Negative.   Gastrointestinal: Negative.   Musculoskeletal:  Positive for back pain.  Allergic/Immunologic: Negative.   Neurological: Negative.   Psychiatric/Behavioral: Negative.       Objective:   BP (!) 151/113   Pulse 82   Temp (!) 97.1 F (36.2 C)   Wt 282 lb 12.8 oz (128.3 kg)   SpO2 99%   BMI 45.65 kg/m   Wt Readings from Last 5 Encounters:  01/05/23 282 lb 12.8 oz (128.3 kg)  11/05/21 (!) 307 lb (139.3 kg)  05/25/21 270 lb (122.5 kg)  03/23/20 270 lb (122.5 kg)  01/09/19 270 lb (122.5 kg)     Physical Exam Vitals and nursing note reviewed.  Constitutional:      General: She is not in acute distress.    Appearance: She is well-developed.  Cardiovascular:     Rate and Rhythm: Normal rate and regular rhythm.  Pulmonary:     Effort: Pulmonary effort is normal.     Breath sounds: Normal breath sounds.  Musculoskeletal:     Lumbar back: Tenderness present. Decreased range of motion.       Back:  Neurological:     Mental Status: She is alert and oriented to person, place, and time.       Assessment & Plan:   Hidradenitis suppurativa of multiple sites -     Clindamycin  Phosphate; Apply topically 2 (two) times daily.  Dispense: 30 g; Refill: 0 -     Doxycycline  Hyclate; Take 1 tablet (100 mg total) by mouth 2 (two) times daily for 10 days.  Dispense: 20 tablet; Refill: 0  Sciatica of right side -     predniSONE ; Take 4 tabs for 2 days, then 3 tabs for 2 days, then 2 tabs for 2 days, then 1 tab for 2 days, then stop  Dispense: 20 tablet; Refill: 0 -     Cyclobenzaprine  HCl; Take 1 tablet (10 mg total) by mouth 3 (three) times daily as needed for muscle spasms.  Dispense: 30 tablet; Refill: 0  Chronic cough -     DG Chest 2 View  Primary hypertension -     amLODIPine  Besylate; Take 1 tablet (10 mg total) by mouth daily.  Dispense: 90 tablet; Refill: 2  Pulmonary hypertension  (HCC) -     Ambulatory referral to Cardiology     Return in about 2 weeks (around 01/19/2023) for physical and physical.   Bascom GORMAN Borer, NP 01/05/2023

## 2023-02-02 ENCOUNTER — Ambulatory Visit: Payer: Self-pay | Admitting: Nurse Practitioner

## 2023-02-05 ENCOUNTER — Ambulatory Visit: Payer: Self-pay | Admitting: Nurse Practitioner

## 2023-02-13 ENCOUNTER — Ambulatory Visit: Payer: BC Managed Care – PPO | Admitting: Family Medicine

## 2023-03-01 ENCOUNTER — Ambulatory Visit: Payer: Self-pay | Admitting: Nurse Practitioner

## 2023-03-01 ENCOUNTER — Ambulatory Visit (INDEPENDENT_AMBULATORY_CARE_PROVIDER_SITE_OTHER): Payer: Medicaid Other

## 2023-03-01 ENCOUNTER — Ambulatory Visit
Admission: EM | Admit: 2023-03-01 | Discharge: 2023-03-01 | Disposition: A | Discharge status: Home / Self Care | Payer: Medicaid Other | Attending: Family Medicine | Admitting: Family Medicine

## 2023-03-01 DIAGNOSIS — R051 Acute cough: Secondary | ICD-10-CM

## 2023-03-01 DIAGNOSIS — B349 Viral infection, unspecified: Secondary | ICD-10-CM

## 2023-03-01 LAB — POC COVID19/FLU A&B COMBO
Covid Antigen, POC: NEGATIVE
Influenza A Antigen, POC: NEGATIVE
Influenza B Antigen, POC: NEGATIVE

## 2023-03-01 MED ORDER — PROMETHAZINE-DM 6.25-15 MG/5ML PO SYRP: 5.0000 mL | ORAL_SOLUTION | Freq: Four times a day (QID) | ORAL | 0 refills | Status: DC | PRN

## 2023-03-01 NOTE — Discharge Instructions (Signed)
 Please treat your symptoms with over the counter tylenol or ibuprofen, humidifier, and rest.  You may take Promethazine DM as needed for cough.  Please note this medication can make you drowsy.  Do not drink alcohol or drive while on this medication.  Viral illnesses can last 7-14 days. Please follow up with your PCP if your symptoms are not improving. Please go to the ER for any worsening symptoms. This includes but is not limited to fever you can not control with tylenol or ibuprofen, you are not able to stay hydrated, you have shortness of breath or chest pain.  Thank you for choosing [facility] for your healthcare needs. I hope you feel better soon!

## 2023-03-01 NOTE — ED Provider Notes (Signed)
 [facility]    CSN: [id number] Arrival date & time: 03/01/23  1415      History   Chief Complaint Chief Complaint  Patient presents with   Cough   Vomiting    HPI Tina Riggs is a 43 y.o. female  presents for evaluation of URI symptoms for 2 days. Patient reports associated symptoms of cough, congestion, fatigue, body aches, fevers, sore throat. Denies N/V/D, ear pain, shortness of breath. Patient does not have a hx of asthma. Patient is an active smoker.   Reports her son is being treated for pneumonia.  Pt has taken cough and symptoms OTC for symptoms. Pt has no other concerns at this time.    Cough Associated symptoms: fever, myalgias and sore throat     Past Medical History:  Diagnosis Date   Chronic cough    Diverticulitis    hospitalized 04/13/2014; hospitalized 04/29/2014   HPV in female    HTN (hypertension)    Pulmonary hypertension (HCC)    Sciatica of right side    Suppurative hidradenitis    Vaginal Pap smear, abnormal     Patient Active Problem List   Diagnosis Date Noted   History of cesarean section 05/02/2017   BMI 40.0-44.9, adult (HCC) 04/11/2017   Diverticular disease 06/11/2015   Diverticulitis of colon with perforation 04/13/2014   Tobacco use 04/13/2014    Past Surgical History:  Procedure Laterality Date   CESAREAN SECTION  02/2006   CESAREAN SECTION N/A 10/17/2017   Procedure: REPEAT CESAREAN SECTION;  Surgeon: Federico Flake, MD;  Location: [facility];  Service: Obstetrics;  Laterality: N/A;   COLON SURGERY     COLONOSCOPY N/A 06/11/2015   Procedure: COLONOSCOPY;  Surgeon: Romie Levee, MD;  Location: [facility];  Service: General;  Laterality: N/A;   COLOSTOMY N/A 05/18/2014   Procedure: COLOSTOMY;  Surgeon: Violeta Gelinas, MD;  Location[facility];  Service: General;  Laterality: N/A;   COLOSTOMY REVERSAL  06/2015   robotic assisted colostomy reversal with LOA/notes 06/16/2015   COLOSTOMY REVISION N/A 05/18/2014    Procedure: COLON RESECTION SIGMOID;  Surgeon: Violeta Gelinas, MD;  Location: [facility] OR;  Service: General;  Laterality: N/A;   COLPOSCOPY  Sep 2012   LAPAROSCOPIC LYSIS OF ADHESIONS N/A 05/18/2014   Procedure: LAPAROSCOPIC MOBILIZATION OF SPLENIC FLEXURE;  Surgeon: Violeta Gelinas, MD;  Location: [facility] OR;  Service: General;  Laterality: N/A;    OB History     Gravida  2   Para  1   Term  1   Preterm      AB      Living  1      SAB      IAB      Ectopic      Multiple      Live Births  1            Home Medications    Prior to Admission medications   Medication Sig Start Date End Date Taking? Authorizing Provider  promethazine-dextromethorphan (PROMETHAZINE-DM) 6.25-15 MG/5ML syrup Take 5 mLs by mouth 4 (four) times daily as needed for cough. 03/01/23  Yes Radford Pax, NP  albuterol (VENTOLIN HFA) 108 (90 Base) MCG/ACT inhaler Inhale 2 puffs into the lungs every 4 (four) hours as needed for wheezing or shortness of breath. Patient not taking: Reported on 01/05/2023 09/26/19   Hall-Potvin, [place], PA-C  amLODipine (NORVASC) 10 MG tablet Take 1 tablet (10 mg total) by mouth daily. 01/05/23  Ivonne Andrew, NP  cetirizine (ZYRTEC ALLERGY) 10 MG tablet Take 1 tablet (10 mg total) by mouth daily. Patient not taking: Reported on 03/23/2020 09/26/19   Hall-Potvin, [place], PA-C  clindamycin (CLINDAGEL) 1 % gel Apply topically 2 (two) times daily. 01/05/23   Ivonne Andrew, NP  cyclobenzaprine (FLEXERIL) 10 MG tablet Take 1 tablet (10 mg total) by mouth 3 (three) times daily as needed for muscle spasms. 01/05/23   Ivonne Andrew, NP  etonogestrel (NEXPLANON) 68 MG IMPL implant 1 each by Subdermal route once. 5 years    [provider]  fluticasone (FLONASE) 50 MCG/ACT nasal spray Place 1 spray into both nostrils daily. Patient not taking: Reported on 03/23/2020 09/26/19   Hall-Potvin, [place], PA-C  predniSONE (DELTASONE) 10 MG tablet Take 4 tabs for 2 days, then 3 tabs for  2 days, then 2 tabs for 2 days, then 1 tab for 2 days, then stop 01/05/23   Ivonne Andrew, NP    Family History Family History  Problem Relation Age of Onset   HIV Mother    Hypertension Mother     Social History Social History   Tobacco Use   Smoking status: Every Day    Current packs/day: 0.50    Average packs/day: 0.5 packs/day for 18.0 years (9.0 ttl pk-yrs)    Types: Cigarettes   Smokeless tobacco: Never  Vaping Use   Vaping status: Former  Substance Use Topics   Alcohol use: Yes    Alcohol/week: 0.0 standard drinks of alcohol    Comment: wine   Drug use: No     Allergies   Penicillins and Robaxin [methocarbamol]   Review of Systems Review of Systems  Constitutional:  Positive for fever.  HENT:  Positive for congestion and sore throat.   Respiratory:  Positive for cough.   Musculoskeletal:  Positive for myalgias.     Physical Exam Triage Vital Signs ED Triage Vitals  Encounter Vitals Group     BP 03/01/23 1429 (!) 146/107     Systolic BP Percentile --      Diastolic BP Percentile --      Pulse Rate 03/01/23 1429 95     Resp 03/01/23 1429 17     Temp 03/01/23 1429 99 F (37.2 C)     Temp Source 03/01/23 1429 Oral     SpO2 03/01/23 1429 98 %     Weight --      Height --      Head Circumference --      Peak Flow --      Pain Score 03/01/23 1427 6     Pain Loc --      Pain Education --      Exclude from Growth Chart --    No data found.  Updated Vital Signs BP (!) 146/107 (BP Location: Right Arm)   Pulse 95   Temp 99 F (37.2 C) (Oral)   Resp 17   LMP  (LMP Unknown)   SpO2 98%   Visual Acuity Right Eye Distance:   Left Eye Distance:   Bilateral Distance:    Right Eye Near:   Left Eye Near:    Bilateral Near:     Physical Exam Vitals and nursing note reviewed.  Constitutional:      General: She is not in acute distress.    Appearance: She is well-developed. She is not ill-appearing.  HENT:     Head: Normocephalic and  atraumatic.  Right Ear: Tympanic membrane and ear canal normal.     Left Ear: Tympanic membrane and ear canal normal.     Nose: Congestion present.     Mouth/Throat:     Mouth: Mucous membranes are moist.     Pharynx: Oropharynx is clear. Uvula midline. Posterior oropharyngeal erythema present.     Tonsils: No tonsillar exudate or tonsillar abscesses.  Eyes:     Conjunctiva/sclera: Conjunctivae normal.     Pupils: Pupils are equal, round, and reactive to light.  Cardiovascular:     Rate and Rhythm: Normal rate and regular rhythm.     Heart sounds: Normal heart sounds.  Pulmonary:     Effort: Pulmonary effort is normal.     Breath sounds: Normal breath sounds.  Musculoskeletal:     Cervical back: Normal range of motion and neck supple.  Lymphadenopathy:     Cervical: No cervical adenopathy.  Skin:    General: Skin is warm and dry.  Neurological:     General: No focal deficit present.     Mental Status: She is alert and oriented to person, place, and time.  Psychiatric:        Mood and Affect: Mood normal.        Behavior: Behavior normal.      UC Treatments / Results  Labs (all labs ordered are listed, but only abnormal results are displayed) Labs Reviewed  POC COVID19/FLU A&B COMBO    EKG   Radiology No results found.  Procedures Procedures (including critical care time)  Medications Ordered in UC Medications - No data to display  Initial Impression / Assessment and Plan / UC Course  I have reviewed the triage vital signs and the nursing notes.  Pertinent labs & imaging results that were available during my care of the patient were reviewed by me and considered in my medical decision making (see chart for details).     Reviewed exam and symptoms with patient.  No red flags.  Negative rapid flu and COVID testing.  Wet read of chest x-ray without obvious consolidation, will contact for any positive results based on radiology overread.  Discussed viral  illness and symptomatic treatment.  Promethazine DM as needed for cough, side effect profile reviewed.  Advise rest, fluids.  PCP follow-up if symptoms do not improve.  ER precautions reviewed and patient verbalized understanding. Final Clinical Impressions(s) / UC Diagnoses   Final diagnoses:  Acute cough  Viral illness     Discharge Instructions       Please treat your symptoms with over the counter tylenol or ibuprofen, humidifier, and rest.  You may take Promethazine DM as needed for cough.  Please note this medication can make you drowsy.  Do not drink alcohol or drive while on this medication.  Viral illnesses can last 7-14 days. Please follow up with your PCP if your symptoms are not improving. Please go to the ER for any worsening symptoms. This includes but is not limited to fever you can not control with tylenol or ibuprofen, you are not able to stay hydrated, you have shortness of breath or chest pain.  Thank you for choosing [facility] for your healthcare needs. I hope you feel better soon!      ED Prescriptions     Medication Sig Dispense Auth. Provider   promethazine-dextromethorphan (PROMETHAZINE-DM) 6.25-15 MG/5ML syrup Take 5 mLs by mouth 4 (four) times daily as needed for cough. 118 mL Radford Pax, NP  PDMP not reviewed this encounter.   Radford Pax, NP 03/01/23 [phone]

## 2023-03-01 NOTE — ED Triage Notes (Addendum)
 Pt presents with c/o cough, sore throat and burning sensation around her body X 2 days. Pt states her son was diagnosed with PNA recently. States she feels a burning feeling around her chest.

## 2023-03-16 ENCOUNTER — Ambulatory Visit: Payer: Medicaid Other | Attending: Cardiology | Admitting: Cardiology

## 2023-03-16 ENCOUNTER — Encounter: Payer: Self-pay | Admitting: Cardiology

## 2023-03-16 VITALS — BP 120/80 | HR 85 | Resp 16 | Ht 66.0 in | Wt 282.4 lb

## 2023-03-16 DIAGNOSIS — R918 Other nonspecific abnormal finding of lung field: Secondary | ICD-10-CM | POA: Diagnosis not present

## 2023-03-16 DIAGNOSIS — I288 Other diseases of pulmonary vessels: Secondary | ICD-10-CM

## 2023-03-16 DIAGNOSIS — R053 Chronic cough: Secondary | ICD-10-CM

## 2023-03-16 DIAGNOSIS — Z8616 Personal history of COVID-19: Secondary | ICD-10-CM

## 2023-03-16 DIAGNOSIS — R9431 Abnormal electrocardiogram [ECG] [EKG]: Secondary | ICD-10-CM | POA: Diagnosis not present

## 2023-03-16 DIAGNOSIS — F17210 Nicotine dependence, cigarettes, uncomplicated: Secondary | ICD-10-CM

## 2023-03-16 DIAGNOSIS — I10 Essential (primary) hypertension: Secondary | ICD-10-CM | POA: Diagnosis not present

## 2023-03-16 DIAGNOSIS — Z6841 Body Mass Index (BMI) 40.0 and over, adult: Secondary | ICD-10-CM

## 2023-03-16 DIAGNOSIS — E66813 Obesity, class 3: Secondary | ICD-10-CM

## 2023-03-16 NOTE — Progress Notes (Signed)
 Cardiology Office Note:  .    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   Tina Riggs is a 43 y.o. African-American female whose past medical history and cardiovascular risk factors includes: Hypertension, obesity (Body mass index is 45.58 kg/m.), hx of covid, smoking (0.5ppd), alcohol use (40oz /day), hx of marijuana/cocaine.   She was referred by the hospital for evaluation of a dilated pulmonary artery noted on a CT scan in June 2024 at [facility].  She has a history of chest pain, described as a sensation of something being stuck in her throat, accompanied by fatigue and a feeling of being 'discombobulated.' The last episode of chest pain occurred last year.  Discomfort not brought on by effort related activities, rest, no change in endurance.  She has hypertension and takes amlodipine. No history of high cholesterol, diabetes, heart attacks, strokes, congestive heart failure, or stents.  She presents with a persistent cough ongoing since contracting COVID-19 approximately three years ago. The cough is frequent, often productive with phlegm, and accompanied by nasal congestion. Despite multiple evaluations, including lung and heart checks, no definitive cause has been identified. She has been prescribed medications twice, but the cough persists.  She experiences shortness of breath, particularly when lying flat, causing her to wake up gasping for air. Her significant other suspects she might stop breathing during sleep. No swelling in her legs or history of blood  clots.  She smokes half a pack of cigarettes daily since age 62 and consumes approximately forty ounces of alcohol daily after work. She has a history of recreational drug use, including marijuana and cocaine.   No structured exercise program or daily routine.  Patient states her brother who is 42 years of age is diagnosed with heart failure. He also has COPD needing O2 supplementation and smokes 1-2ppd (knowing that it is a fire hazard for him and his surrounding).    Review of Systems: .   Review of Systems  Cardiovascular:  Negative for chest pain, claudication, irregular heartbeat, leg swelling, near-syncope, orthopnea, palpitations, paroxysmal nocturnal dyspnea and syncope.  Respiratory:  Positive for cough (chronic), shortness of breath (chronic) and snoring.   Hematologic/Lymphatic: Negative for bleeding problem. Does not bruise/bleed easily.    Studies Reviewed:   EKG: EKG Interpretation Date/Time:  Friday March 16 2023 13:53:44 EDT Ventricular Rate:  93 PR Interval:  140 QRS Duration:  76 QT Interval:  408 QTC Calculation: 507 R Axis:   18  Text Interpretation: Normal sinus rhythm Prolonged QT When compared with ECG of 05-Nov-2021 16:34, T wave abnormality Anterolateral leads NO LONGER PRESENT QT has lengthened Confirmed by Tessa Lerner [phone]) on 03/16/2023 1:55:52 PM  Echocardiogram: NA  RADIOLOGY: CTA chest PE protocol June 2024 [facility], Care Everywhere No vascular filling defect to suggest pulmonary emboli.  No acute infiltrate or pleural effusion.  Borderline cardiomegaly with a dilated pulmonary artery can be seen with pulmonary hypertension. Trace pericardial effusion.  2.5 x 2.8 cm right suprahilar lymph node.   Risk Assessment/Calculations:   NA   Labs:  Latest Ref Rng & Units 11/05/2021    7:35 PM 06/23/2018    2:43 PM 11/01/2017   11:33 AM  CBC  WBC 4.0 - 10.5 K/uL 8.9  8.8  7.7   Hemoglobin 12.0 - 15.0 g/dL 41.3  24.4  01.0    Hematocrit 36.0 - 46.0 % 43.1  40.7  33.6   Platelets 150 - 400 K/uL 292  329  401        Latest Ref Rng & Units 11/05/2021    7:35 PM 06/23/2018    2:43 PM 11/01/2017   11:33 AM  BMP  Glucose 70 - 99 mg/dL 272  98  88   BUN 6 - 20 mg/dL 9  12  10    Creatinine 0.44 - 1.00 mg/dL 5.36  6.44  0.34   BUN/Creat Ratio 9 - 23   10   Sodium 135 - 145 mmol/L 137  140  141   Potassium 3.5 - 5.1 mmol/L 3.6  3.8  4.1   Chloride 98 - 111 mmol/L 103  107  103   CO2 22 - 32 mmol/L 26  24  22    Calcium 8.9 - 10.3 mg/dL 8.5  8.4  9.3       Latest Ref Rng & Units 11/05/2021    7:35 PM 06/23/2018    2:43 PM 11/01/2017   11:33 AM  CMP  Glucose 70 - 99 mg/dL 742  98  88   BUN 6 - 20 mg/dL 9  12  10    Creatinine 0.44 - 1.00 mg/dL 5.95  6.38  7.56   Sodium 135 - 145 mmol/L 137  140  141   Potassium 3.5 - 5.1 mmol/L 3.6  3.8  4.1   Chloride 98 - 111 mmol/L 103  107  103   CO2 22 - 32 mmol/L 26  24  22    Calcium 8.9 - 10.3 mg/dL 8.5  8.4  9.3   Total Protein 6.5 - 8.1 g/dL 6.8  6.9  6.3   Total Bilirubin 0.3 - 1.2 mg/dL 0.5  0.4  0.3   Alkaline Phos 38 - 126 U/L 99  103  127   AST 15 - 41 U/L 22  18  15    ALT 0 - 44 U/L 16  17  17      No results found for: "CHOL", "HDL", "LDLCALC", "LDLDIRECT", "TRIG", "CHOLHDL" No results for input(s): "LIPOA" in the last 8760 hours. No components found for: "NTPROBNP" No results for input(s): "PROBNP" in the last 8760 hours. No results for input(s): "TSH" in the last 8760 hours.  Physical Exam:    Today's Vitals   03/16/23 1353  BP: 120/80  Pulse: 85  Resp: 16  SpO2: 98%  Weight: 282 lb 6.4 oz (128.1 kg)  Height: 5\' 6"  (1.676 m)   Body mass index is 45.58 kg/m. Wt Readings from Last 3 Encounters:  03/16/23 282 lb 6.4 oz (128.1 kg)  01/05/23 282 lb 12.8 oz (128.3 kg)  11/05/21 (!) 307 lb (139.3 kg)    Physical Exam  Constitutional: No distress.  hemodynamically stable, appears older than stated age.   Neck: No JVD present.  Cardiovascular:  Normal rate, regular rhythm, S1 normal and S2 normal. Exam reveals no gallop, no S3 and no S4.  No murmur heard. Pulmonary/Chest: Effort normal and breath sounds normal. No stridor. She has no wheezes. She has no rales.  Abdominal: Soft. Bowel sounds are normal. She exhibits no distension. There is no abdominal  tenderness.  obese  Musculoskeletal:        General: No edema.     Cervical back: Neck supple.  Skin: Skin is warm.   Impression & Recommendation(s):  Impression:   ICD-10-CM   1. Abnormal CT scan of lung  R91.8 EKG 12-Lead    2. 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 Prolonged QT interval  R94.31     4. Benign hypertension  I10 Basic metabolic panel    Basic metabolic panel    5. Chronic cough  R05.3     6. Cigarette smoker  F17.210     7. History of COVID-19  Z86.16     8. Class 3 severe obesity due to excess calories with serious comorbidity and body mass index (BMI) of 40.0 to 44.9 in adult [facility])  X52.841    E66.01    Z68.41        Recommendation(s):  Abnormal CT scan of lung Dilated pulmonary trunk (HCC) Incidentally noted on CT scan of the chest during the workup of cough Etiology is unclear. Contributing factors: Obesity, clinical concern for hypoventilatory syndrome, undiagnosed sleep apnea Echo will be ordered to evaluate for structural heart disease and left ventricular systolic function. Will check baseline BNP and BMP. Further recommendations to follow She will reach out to PCP to have a sleep apnea study/consult arranged.  Prolonged QT interval Incidentally noted to have a prolonged QT on today's EKG. No identifiable reversible cause. Will check electrolytes and renal function. Patient is asked to avoid medications that may prolong her QT.  To avoid this, patient is asked to make sure with her provider with the medication prescribed  will affect her QT.  Benign hypertension Office blood pressure is acceptable. Currently on amlodipine 10 mg p.o. daily  Chronic cough History of COVID-19 Ongoing for the last several years as noted in HPI. Recommend follow-up with pulmonary medicine for more thorough workup  Cigarette smoker Tobacco cessation counseling: Currently smoking 0.5 packs/day   She is informed of the dangers of tobacco abuse including stroke, cancer, and MI, as well as benefits of tobacco cessation. She is not willing to quit at this time. 5 mins were spent counseling patient cessation techniques. We discussed various methods to help quit smoking, including deciding on a date to quit, joining a support group, pharmacological agents- nicotine gum/patch/lozenges.  I will reassess her progress at the next follow-up visit  Class 3 severe obesity due to excess calories with serious comorbidity and body mass index (BMI) of 40.0 to 44.9 in adult [facility]) Body mass index is 45.58 kg/m. I reviewed with her importance of diet, regular physical activity/exercise, weight loss.   Patient is educated on the importance of increasing physical activity gradually as tolerated with a goal of moderate intensity exercise for 30 minutes a day 5 days a week.  Of note, patient had 1 episode of precordial pain approximately 1 year ago no reoccurrence of symptoms.  No exertional chest pain.  Reemphasized importance of improving her modifiable cardiovascular risk factors.  If she has recurrence additional testing could be considered.  Patient agreeable with the plan of care.  Orders Placed:  Orders Placed This Encounter  Procedures   Basic metabolic panel    Standing Status:   Future    Number of Occurrences:   1    Expiration Date:  03/15/2024   Pro b natriuretic peptide (BNP)   EKG 12-Lead   ECHOCARDIOGRAM COMPLETE    Standing Status:   Future    Expected Date:   03/23/2023    Expiration Date:   03/15/2024    Where should this  test be performed:   [facility])    Does the patient weigh less than or greater than 250 lbs?:   Patient weighs greater than 250 lbs             282 lbs    Perflutren DEFINITY (image enhancing agent) should be administered unless hypersensitivity or allergy exist:   Administer Perflutren    Reason for exam-Echo:   Other-Full Diagnosis List    Full ICD-10/Reason for Exam:   Dilated pulmonary trunk (HCC) [[id number]]     Final Medication List:   No orders of the defined types were placed in this encounter.   Medications Discontinued During This Encounter  Medication Reason   cetirizine (ZYRTEC ALLERGY) 10 MG tablet Completed Course   clindamycin (CLINDAGEL) 1 % gel Completed Course   cyclobenzaprine (FLEXERIL) 10 MG tablet Completed Course   predniSONE (DELTASONE) 10 MG tablet Completed Course   promethazine-dextromethorphan (PROMETHAZINE-DM) 6.25-15 MG/5ML syrup Completed Course     Current Outpatient Medications:    amLODipine (NORVASC) 10 MG tablet, Take 1 tablet (10 mg total) by mouth daily., Disp: 90 tablet, Rfl: 2   etonogestrel (NEXPLANON) 68 MG IMPL implant, 1 each by Subdermal route once. 5 years, Disp: , Rfl:    fluticasone (FLONASE) 50 MCG/ACT nasal spray, Place 1 spray into both nostrils daily., Disp: 16 g, Rfl: 0   albuterol (VENTOLIN HFA) 108 (90 Base) MCG/ACT inhaler, Inhale 2 puffs into the lungs every 4 (four) hours as needed for wheezing or shortness of breath. (Patient not taking: Reported on 03/16/2023), Disp: 18 g, Rfl: 0  Consent:   NA  Disposition:   55-month follow-up sooner if needed  Her questions and concerns were addressed to her satisfaction. She voices understanding of the recommendations provided during this encounter.    Signed, Tessa Lerner, DO, [facility]  [facility]  [address] [place], [place] [address] 03/20/2023 12:34 AM

## 2023-03-16 NOTE — Patient Instructions (Addendum)
 Medication Instructions:  Your physician recommends that you continue on your current medications as directed. Please refer to the Current Medication list given to you today.  *If you need a refill on your cardiac medications before your next appointment, please call your pharmacy*  Lab Work: To be completed today: BMP and Pro-BNP  If you have labs (blood work) drawn today and your tests are completely normal, you will receive your results only by: MyChart Message (if you have MyChart) OR A paper copy in the mail If you have any lab test that is abnormal or we need to change your treatment, we will call you to review the results.  Testing/Procedures: Your physician has requested that you have an echocardiogram. Echocardiography is a painless test that uses sound waves to create images of your heart. It provides your doctor with information about the size and shape of your heart and how well your heart's chambers and valves are working. This procedure takes approximately one hour. There are no restrictions for this procedure. Please do NOT wear cologne, perfume, aftershave, or lotions (deodorant is allowed). Please arrive 15 minutes prior to your appointment time.  Please note: We ask at that you not bring children with you during ultrasound (echo/ vascular) testing. Due to room size and safety concerns, children are not allowed in the ultrasound rooms during exams. Our front office staff cannot provide observation of children in our lobby area while testing is being conducted. An adult accompanying a patient to their appointment will only be allowed in the ultrasound room at the discretion of the ultrasound technician under special circumstances. We apologize for any inconvenience.   Follow-Up: At [facility], you and your health needs are our priority.  As part of our continuing mission to provide you with exceptional heart care, we have created designated Provider Care Teams.  These Care  Teams include your primary Cardiologist (physician) and Advanced Practice Providers (APPs -  Physician Assistants and Nurse Practitioners) who all work together to provide you with the care you need, when you need it.  Your next appointment:   3 month(s)  The format for your next appointment:   In Person  Provider:   Tessa Lerner, DO {  Other Instructions Please see your primary care provider to discuss having a sleep study referral placed and referral for pulmonology.   Avoid medications that could prolong the QT interval. Discuss with your pharmacist before starting any new medications.

## 2023-03-27 ENCOUNTER — Ambulatory Visit: Payer: BC Managed Care – PPO | Admitting: Obstetrics

## 2023-03-27 ENCOUNTER — Other Ambulatory Visit (HOSPITAL_COMMUNITY)
Admission: RE | Admit: 2023-03-27 | Discharge: 2023-03-27 | Disposition: A | Discharge status: Home / Self Care | Source: Ambulatory Visit | Attending: Obstetrics & Gynecology | Admitting: Obstetrics & Gynecology

## 2023-03-27 ENCOUNTER — Encounter: Payer: Self-pay | Admitting: Obstetrics

## 2023-03-27 VITALS — BP 140/102 | HR 88 | Ht 66.5 in | Wt 278.9 lb

## 2023-03-27 DIAGNOSIS — Z1239 Encounter for other screening for malignant neoplasm of breast: Secondary | ICD-10-CM | POA: Diagnosis not present

## 2023-03-27 DIAGNOSIS — Z01419 Encounter for gynecological examination (general) (routine) without abnormal findings: Secondary | ICD-10-CM

## 2023-03-27 DIAGNOSIS — N6002 Solitary cyst of left breast: Secondary | ICD-10-CM | POA: Diagnosis not present

## 2023-03-27 DIAGNOSIS — E66813 Obesity, class 3: Secondary | ICD-10-CM

## 2023-03-27 DIAGNOSIS — I10 Essential (primary) hypertension: Secondary | ICD-10-CM

## 2023-03-27 DIAGNOSIS — K5790 Diverticulosis of intestine, part unspecified, without perforation or abscess without bleeding: Secondary | ICD-10-CM

## 2023-03-27 DIAGNOSIS — J301 Allergic rhinitis due to pollen: Secondary | ICD-10-CM

## 2023-03-27 DIAGNOSIS — Z6841 Body Mass Index (BMI) 40.0 and over, adult: Secondary | ICD-10-CM

## 2023-03-27 DIAGNOSIS — N898 Other specified noninflammatory disorders of vagina: Secondary | ICD-10-CM

## 2023-03-27 DIAGNOSIS — Z720 Tobacco use: Secondary | ICD-10-CM

## 2023-03-27 MED ORDER — LORATADINE 10 MG PO TABS: 10.0000 mg | ORAL_TABLET | Freq: Every day | ORAL | 11 refills | Status: AC

## 2023-03-27 MED ORDER — METRONIDAZOLE 0.75 % VA GEL: 1.0000 | Freq: Two times a day (BID) | VAGINAL | 2 refills | Status: AC

## 2023-03-27 NOTE — Progress Notes (Signed)
 Pt presents for annual. Pt needs a pap, mammogram, and wants all std testing.

## 2023-03-27 NOTE — Progress Notes (Addendum)
 Subjective:        Tina Riggs is a 43 y.o. female here for a routine exam.  Current complaints: Vaginal discharge.  Seasonal allergies..    Personal health questionnaire:  Is patient Ashkenazi Jewish, have a family history of breast and/or ovarian cancer: no Is there a family history of uterine cancer diagnosed at age < 30, gastrointestinal cancer, urinary tract cancer, family member who is a Personnel officer syndrome-associated carrier: no Is the patient overweight and hypertensive, family history of diabetes, personal history of gestational diabetes, preeclampsia or PCOS: yes Is patient over 65, have PCOS,  family history of premature CHD under age 35, diabetes, smoke, have hypertension or peripheral artery disease:  no At any time, has a partner hit, kicked or otherwise hurt or frightened you?: no Over the past 2 weeks, have you felt down, depressed or hopeless?: no Over the past 2 weeks, have you felt little interest or pleasure in doing things?:no   Gynecologic History Patient's last menstrual period was 03/18/2023 (approximate). Contraception: Nexplanon Last Pap: 2019. Results were: normal Last mammogram: none. Results were: none  Obstetric History OB History  Gravida Para Term Preterm AB Living  2 1 1   1   SAB IAB Ectopic Multiple Live Births      1    # Outcome Date GA Lbr Len/2nd Weight Sex Type Anes PTL Lv  2 Gravida           1 Term 2008 [redacted]w[redacted]d  7 lb 1 oz (3.204 kg) M CS-LTranv EPI  LIV     Birth Comments: c/s due to something about potassium    Past Medical History:  Diagnosis Date   Chronic cough    Diverticulitis    hospitalized 04/13/2014; hospitalized 04/29/2014   HPV in female    HTN (hypertension)    Pulmonary hypertension (HCC)    Sciatica of right side    Suppurative hidradenitis    Vaginal Pap smear, abnormal     Past Surgical History:  Procedure Laterality Date   CESAREAN SECTION  02/2006   CESAREAN SECTION N/A 10/17/2017   Procedure: REPEAT CESAREAN  SECTION;  Surgeon: Federico Flake, MD;  Location: [facility]ING SU[facility];  Service: Obstetrics;  Laterality: N/A;   COLON SURGERY     COLONOSCOPY N/A 06/11/2015   Procedure: COLONOSCOPY;  Surgeon: Romie Levee, MD;  Location: [facility] ORS;  Service: General;  Laterality: N/A;   COLOSTOMY N/A 05/18/2014   Procedure: COLOSTOMY;  Surgeon: Violeta Gelinas, MD;  Location: [facility] OR;  Service: General;  Laterality: N/A;   COLOSTOMY REVERSAL  06/2015   robotic assisted colostomy reversal with LOA/notes 06/16/2015   COLOSTOMY REVISION N/A 05/18/2014   Procedure: COLON RESECTION SIGMOID;  Surgeon: Violeta Gelinas, MD;  Location: [facility] OR;  Service: General;  Laterality: N/A;   COLPOSCOPY  Sep 2012   LAPAROSCOPIC LYSIS OF ADHESIONS N/A 05/18/2014   Procedure: LAPAROSCOPIC MOBILIZATION OF SPLENIC FLEXURE;  Surgeon: Violeta Gelinas, MD;  Location: [facility] OR;  Service: General;  Laterality: N/A;     Current Outpatient Medications:    amLODipine (NORVASC) 10 MG tablet, Take 1 tablet (10 mg total) by mouth daily., Disp: 90 tablet, Rfl: 2   etonogestrel (NEXPLANON) 68 MG IMPL implant, 1 each by Subdermal route once. 5 years, Disp: , Rfl:    loratadine (CLARITIN) 10 MG tablet, Take 1 tablet (10 mg total) by mouth daily., Disp: 30 tablet, Rfl: 11   metroNIDAZOLE (METROGEL) 0.75 % vaginal gel, Place 1 Applicatorful vaginally 2 (  two) times daily., Disp: 70 g, Rfl: 2   albuterol (VENTOLIN HFA) 108 (90 Base) MCG/ACT inhaler, Inhale 2 puffs into the lungs every 4 (four) hours as needed for wheezing or shortness of breath. (Patient not taking: Reported on 03/16/2023), Disp: 18 g, Rfl: 0   fluticasone (FLONASE) 50 MCG/ACT nasal spray, Place 1 spray into both nostrils daily. (Patient not taking: Reported on 03/27/2023), Disp: 16 g, Rfl: 0 Allergies  Allergen Reactions   Penicillins Itching, Rash and Other (See Comments)    Has patient had a PCN reaction causing immediate rash, facial/tongue/throat swelling, SOB or lightheadedness with  hypotension: yes Has patient had a PCN reaction causing severe rash involving mucus membranes or skin necrosis: no Has patient had a PCN reaction that required hospitalization no Has patient had a PCN reaction occurring within the last 10 years: no If all of the above answers are "NO", then may proceed with Cephalosporin use. Pt states she has tolerated Amoxicillin in the past    Robaxin [Methocarbamol] Swelling and Other (See Comments)    Bottom lip swelled    Social History   Tobacco Use   Smoking status: Every Day    Current packs/day: 0.50    Average packs/day: 0.5 packs/day for 18.0 years (9.0 ttl pk-yrs)    Types: Cigarettes   Smokeless tobacco: Never  Substance Use Topics   Alcohol use: Yes    Alcohol/week: 0.0 standard drinks of alcohol    Comment: wine    Family History  Problem Relation Age of Onset   HIV Mother    Hypertension Mother       Review of Systems  Constitutional: negative for fatigue and weight loss Respiratory: negative for cough and wheezing Cardiovascular: negative for chest pain, fatigue and palpitations Gastrointestinal: negative for abdominal pain and change in bowel habits Musculoskeletal:negative for myalgias Neurological: negative for gait problems and tremors Behavioral/Psych: negative for abusive relationship, depression Endocrine: negative for temperature intolerance    Genitourinary: positive for vaginal discharge.  negative for abnormal menstrual periods, genital lesions, hot flashes, sexual problems  Integument/breast: negative for breast lump, breast tenderness, nipple discharge and skin lesion(s)    Objective:       BP (!) 140/102   Pulse 88   Ht 5' 6.5" (1.689 m)   Wt 278 lb 14.4 oz (126.5 kg)   LMP 03/18/2023 (Approximate)   BMI 44.34 kg/m  General:   Alert and no distress  Skin:   no rash or abnormalities  Lungs:   clear to auscultation bilaterally  Heart:   regular rate and rhythm, S1, S2 normal, no murmur, click, rub  or gallop  Breasts:   normal without suspicious masses, skin or nipple changes or axillary nodes  Abdomen:  normal findings: no organomegaly, soft, non-tender and no hernia  Pelvis:  External genitalia: normal general appearance Urinary system: urethral meatus normal and bladder without fullness, nontender Vaginal: normal without tenderness, induration or masses Cervix: normal appearance Adnexa: normal bimanual exam Uterus: anteverted and non-tender, normal size   Lab Review Urine pregnancy test Labs reviewed yes Radiologic studies reviewed yes  I have spent a total of 20 minutes of face-to-face time, excluding clinical staff time, reviewing notes and preparing to see patient, ordering tests and/or medications, and counseling the patient.   Assessment:    1. Encounter for gynecological examination with Papanicolaou smear of cervix (Primary) Rx: - Cytology - PAP( [place])  2. Screening breast examination Rx: - MM 3D SCREENING MAMMOGRAM BILATERAL BREAST; Future  3. Cyst of nipple, left Rx: - MM Digital Diagnostic Unilat L; Future - [place] LIMITED ULTRASOUND INCLUDING AXILLA LEFT BREAST ; Future  4. Vaginal discharge Rx: - Cervicovaginal ancillary only( ) - metroNIDAZOLE (METROGEL) 0.75 % vaginal gel; Place 1 Applicatorful vaginally 2 (two) times daily.  Dispense: 70 g; Refill: 2  5. Class 3 severe obesity due to excess calories without serious comorbidity with body mass index (BMI) of 40.0 to 44.9 in adult (HCC) - weight reduction with the aid of dietary changes, exercise and behavioral modification recommended  6. Diverticular disease - clinically stable  7. HTN (hypertension), benign - clinically stable - managed by PCP  8. Tobacco use - cessation recommended  9. Seasonal allergic rhinitis due to pollen Rx: - loratadine (CLARITIN) 10 MG tablet; Take 1 tablet (10 mg total) by mouth daily.  Dispense: 30 tablet; Refill: 11      Plan:    Education  reviewed: calcium supplements, depression evaluation, low fat, low cholesterol diet, safe sex/STD prevention, self breast exams, smoking cessation, and weight bearing exercise. Contraception: Nexplanon. Mammogram ordered. Follow up in: 4 weeks.   Meds ordered this encounter  Medications   metroNIDAZOLE (METROGEL) 0.75 % vaginal gel    Sig: Place 1 Applicatorful vaginally 2 (two) times daily.    Dispense:  70 g    Refill:  2   loratadine (CLARITIN) 10 MG tablet    Sig: Take 1 tablet (10 mg total) by mouth daily.    Dispense:  30 tablet    Refill:  11   Orders Placed This Encounter  Procedures   [place] LIMITED ULTRASOUND INCLUDING AXILLA LEFT BREAST     INS:[employer] ID: [id number] L PREV:2017 @[facility]  NO SURGERY/ NO IMPLANTS OR REDUCTION/ NO BREAST CANCER// NO NEEDS AJ SW PT PT AWARE $75 NO SHOW/CANCELLATION FEE WITHIN 24 HOURS SEND FOR [facility] 03/27/2023    Standing Status:   Future    Expiration Date:   03/26/2024    Reason for Exam (SYMPTOM  OR DIAGNOSIS REQUIRED):   Left nipple cyst    Preferred [facility]?:   [facility]   MM 3D DIAGNOSTIC MAMMOGRAM BILATERAL BREAST    INS:[employer] ID: [id number] L PREV:2017 @[facility]  NO SURGERY/ NO IMPLANTS OR REDUCTION/ NO BREAST CANCER// NO NEEDS AJ SW PT  PT AWARE $75 NO SHOW/CANCELLATION FEE WITHIN 24 HOURS  SEND FOR COSIGN 03/27/2023    Standing Status:   Future    Expiration Date:   03/26/2024    Reason for Exam (SYMPTOM  OR DIAGNOSIS REQUIRED):   Left nipple cyst    Is the patient pregnant?:   No    Preferred imaging location?:   [facility]    Brock Bad, MD, FACOG Attending Obstetrician & Gynecologist, [facility] for [facility], [facility], [place] 03/27/2023

## 2023-03-28 LAB — CERVICOVAGINAL ANCILLARY ONLY
Bacterial Vaginitis (gardnerella): POSITIVE — AB
Candida Glabrata: NEGATIVE
Candida Vaginitis: NEGATIVE
Chlamydia: NEGATIVE
Comment: NEGATIVE
Comment: NEGATIVE
Comment: NEGATIVE
Comment: NEGATIVE
Comment: NEGATIVE
Comment: NORMAL
Neisseria Gonorrhea: NEGATIVE
Trichomonas: NEGATIVE

## 2023-03-28 LAB — BASIC METABOLIC PANEL
BUN/Creatinine Ratio: 12 (ref 9–23)
BUN: 9 mg/dL (ref 6–24)
CO2: 22 mmol/L (ref 20–29)
Calcium: 9.2 mg/dL (ref 8.7–10.2)
Chloride: 101 mmol/L (ref 96–106)
Creatinine, Ser: 0.78 mg/dL (ref 0.57–1.00)
Glucose: 104 mg/dL — ABNORMAL HIGH (ref 70–99)
Potassium: 4.3 mmol/L (ref 3.5–5.2)
Sodium: 139 mmol/L (ref 134–144)
eGFR: 98 mL/min/{1.73_m2} (ref >59)

## 2023-03-28 LAB — PRO B NATRIURETIC PEPTIDE: NT-Pro BNP: 119 pg/mL (ref 0–130)

## 2023-03-29 LAB — CYTOLOGY - PAP
Comment: NEGATIVE
Diagnosis: NEGATIVE
High risk HPV: NEGATIVE

## 2023-04-03 ENCOUNTER — Ambulatory Visit

## 2023-04-16 ENCOUNTER — Ambulatory Visit (HOSPITAL_COMMUNITY)

## 2023-04-18 ENCOUNTER — Ambulatory Visit (HOSPITAL_COMMUNITY): Attending: Cardiology

## 2023-04-20 ENCOUNTER — Ambulatory Visit
Admission: RE | Admit: 2023-04-20 | Discharge: 2023-04-20 | Disposition: A | Discharge status: Home / Self Care | Source: Ambulatory Visit | Attending: Obstetrics | Admitting: Obstetrics

## 2023-04-20 ENCOUNTER — Other Ambulatory Visit: Payer: Self-pay | Admitting: Obstetrics

## 2023-04-20 DIAGNOSIS — N6002 Solitary cyst of left breast: Secondary | ICD-10-CM

## 2023-04-20 DIAGNOSIS — N63 Unspecified lump in unspecified breast: Secondary | ICD-10-CM

## 2023-04-23 ENCOUNTER — Inpatient Hospital Stay: Admission: RE | Admit: 2023-04-23 | Source: Ambulatory Visit

## 2023-04-24 ENCOUNTER — Ambulatory Visit: Admitting: Obstetrics

## 2023-04-25 ENCOUNTER — Encounter (HOSPITAL_COMMUNITY): Payer: Self-pay | Admitting: Cardiology

## 2023-04-26 ENCOUNTER — Ambulatory Visit: Admitting: Obstetrics and Gynecology

## 2023-04-30 ENCOUNTER — Other Ambulatory Visit

## 2023-05-01 ENCOUNTER — Inpatient Hospital Stay: Admission: RE | Admit: 2023-05-01 | Source: Ambulatory Visit

## 2023-05-02 ENCOUNTER — Other Ambulatory Visit

## 2023-05-03 ENCOUNTER — Ambulatory Visit: Admitting: Obstetrics and Gynecology

## 2023-05-08 ENCOUNTER — Ambulatory Visit
Admission: RE | Admit: 2023-05-08 | Discharge: 2023-05-08 | Disposition: A | Discharge status: Home / Self Care | Source: Ambulatory Visit | Attending: Obstetrics | Admitting: Obstetrics

## 2023-05-08 DIAGNOSIS — N63 Unspecified lump in unspecified breast: Secondary | ICD-10-CM

## 2023-05-08 HISTORY — PX: BREAST BIOPSY: SHX20

## 2023-05-09 LAB — SURGICAL PATHOLOGY

## 2023-05-10 LAB — BODY FLUID CULTURE W GRAM STAIN: Gram Stain: NONE SEEN

## 2023-05-11 ENCOUNTER — Ambulatory Visit
Admission: EM | Admit: 2023-05-11 | Discharge: 2023-05-11 | Disposition: A | Discharge status: Home / Self Care | Attending: Family Medicine | Admitting: Family Medicine

## 2023-05-11 DIAGNOSIS — M25571 Pain in right ankle and joints of right foot: Secondary | ICD-10-CM

## 2023-05-11 DIAGNOSIS — M19071 Primary osteoarthritis, right ankle and foot: Secondary | ICD-10-CM | POA: Diagnosis not present

## 2023-05-11 MED ORDER — PREDNISONE 10 MG PO TABS: 30.0000 mg | ORAL_TABLET | Freq: Every day | ORAL | 0 refills | Status: AC

## 2023-05-11 NOTE — ED Triage Notes (Signed)
 Right ankle pain. Wants to know if it gout.

## 2023-05-11 NOTE — ED Provider Notes (Signed)
 [facility] - URGENT CARE CENTER  Note:  This document was prepared using Conservation officer, historic buildings and may include unintentional dictation errors.  MRN: [id number] DOB: May 13, 1980  Subjective:   Tina Riggs is a 43 y.o. female presenting for 1 day history of acute onset right ankle pain, swelling over night.  No fall, trauma, warmth, erythema, wounds, drainage of pus or bleeding, fever.  No history of gout.  Patient does have a podiatrist that she has seen before.  Reports that many years ago she was told she had arthritis in both ankles.  No current facility-administered medications for this encounter.  Current Outpatient Medications:    amLODipine  (NORVASC ) 10 MG tablet, Take 1 tablet (10 mg total) by mouth daily., Disp: 90 tablet, Rfl: 2   etonogestrel  (NEXPLANON ) 68 MG IMPL implant, 1 each by Subdermal route once. 5 years, Disp: , Rfl:    albuterol  (VENTOLIN  HFA) 108 (90 Base) MCG/ACT inhaler, Inhale 2 puffs into the lungs every 4 (four) hours as needed for wheezing or shortness of breath. (Patient not taking: Reported on 03/16/2023), Disp: 18 g, Rfl: 0   fluticasone  (FLONASE ) 50 MCG/ACT nasal spray, Place 1 spray into both nostrils daily. (Patient not taking: Reported on 03/27/2023), Disp: 16 g, Rfl: 0   loratadine  (CLARITIN ) 10 MG tablet, Take 1 tablet (10 mg total) by mouth daily., Disp: 30 tablet, Rfl: 11   metroNIDAZOLE  (METROGEL ) 0.75 % vaginal gel, Place 1 Applicatorful vaginally 2 (two) times daily., Disp: 70 g, Rfl: 2   Allergies  Allergen Reactions   Penicillins Itching, Rash and Other (See Comments)    Has patient had a PCN reaction causing immediate rash, facial/tongue/throat swelling, SOB or lightheadedness with hypotension: yes Has patient had a PCN reaction causing severe rash involving mucus membranes or skin necrosis: no Has patient had a PCN reaction that required hospitalization no Has patient had a PCN reaction occurring within the last 10 years:  no If all of the above answers are "NO", then may proceed with Cephalosporin use. Pt states she has tolerated Amoxicillin  in the past    Robaxin  [Methocarbamol ] Swelling and Other (See Comments)    Bottom lip swelled    Past Medical History:  Diagnosis Date   Chronic cough    Diverticulitis    hospitalized 04/13/2014; hospitalized 04/29/2014   HPV in female    HTN (hypertension)    Pulmonary hypertension (HCC)    Sciatica of right side    Suppurative hidradenitis    Vaginal Pap smear, abnormal      Past Surgical History:  Procedure Laterality Date   BREAST BIOPSY Left 05/08/2023   US  LT BREAST BX W LOC DEV 1ST LESION IMG BX SPEC US  GUIDE 05/08/2023 [facility] MAMMOGRAPHY   CESAREAN SECTION  02/2006   CESAREAN SECTION N/A 10/17/2017   Procedure: REPEAT CESAREAN SECTION;  Surgeon: Abner Ables, MD;  Location: [facility] BIRTHING SUITES;  Service: Obstetrics;  Laterality: N/A;   COLON SURGERY     COLONOSCOPY N/A 06/11/2015   Procedure: COLONOSCOPY;  Surgeon: Joyce Nixon, MD;  Location: [facility] ORS;  Service: General;  Laterality: N/A;   COLOSTOMY N/A 05/18/2014   Procedure: COLOSTOMY;  Surgeon: Dorena Gander, MD;  Location: [facility] OR;  Service: General;  Laterality: N/A;   COLOSTOMY REVERSAL  06/2015   robotic assisted colostomy reversal with LOA/notes 06/16/2015   COLOSTOMY REVISION N/A 05/18/2014   Procedure: COLON RESECTION SIGMOID;  Surgeon: Dorena Gander, MD;  Location: [facility] OR;  Service: General;  Laterality:  N/A;   COLPOSCOPY  Sep 2012   LAPAROSCOPIC LYSIS OF ADHESIONS N/A 05/18/2014   Procedure: LAPAROSCOPIC MOBILIZATION OF SPLENIC FLEXURE;  Surgeon: Dorena Gander, MD;  Location: [facility] OR;  Service: General;  Laterality: N/A;    Family History  Problem Relation Age of Onset   HIV Mother    Hypertension Mother     Social History   Tobacco Use   Smoking status: Every Day    Current packs/day: 0.50    Average packs/day: 0.5 packs/day for 18.0 years (9.0 ttl pk-yrs)    Types:  Cigarettes   Smokeless tobacco: Never  Vaping Use   Vaping status: Former  Substance Use Topics   Alcohol use: Yes    Alcohol/week: 0.0 standard drinks of alcohol    Comment: wine   Drug use: No    ROS   Objective:   Vitals: BP (!) 146/102 (BP Location: Right Arm)   Pulse 76   Temp 98.4 F (36.9 C) (Oral)   Resp 17   SpO2 95%   Physical Exam Constitutional:      General: She is not in acute distress.    Appearance: Normal appearance. She is well-developed. She is not ill-appearing, toxic-appearing or diaphoretic.  HENT:     Head: Normocephalic and atraumatic.     Nose: Nose normal.     Mouth/Throat:     Mouth: Mucous membranes are moist.  Eyes:     General: No scleral icterus.       Right eye: No discharge.        Left eye: No discharge.     Extraocular Movements: Extraocular movements intact.  Cardiovascular:     Rate and Rhythm: Normal rate.  Pulmonary:     Effort: Pulmonary effort is normal.  Musculoskeletal:     Right ankle: Swelling present. No deformity, ecchymosis or lacerations. No tenderness. Normal range of motion. Anterior drawer test negative.     Right Achilles Tendon: No tenderness or defects. Thompson's test negative.  Skin:    General: Skin is warm and dry.  Neurological:     General: No focal deficit present.     Mental Status: She is alert and oriented to person, place, and time.  Psychiatric:        Mood and Affect: Mood normal.        Behavior: Behavior normal.     Assessment and Plan :   PDMP not reviewed this encounter.  1. Acute right ankle pain   2. Arthritis of right ankle    Patient has a largely unremarkable exam.  Suspect arthritic pain likely due to body habitus.  Recommended prednisone  at 30 mg for 5 days.  Offered imaging but patient refused politely.  She plans on following up with her podiatrist soon as possible.  Counseled patient on potential for adverse effects with medications prescribed/recommended today, ER and  return-to-clinic precautions discussed, patient verbalized understanding.    Adolph Hoop, PA-C 05/11/23 1723

## 2023-05-24 ENCOUNTER — Encounter: Payer: Self-pay | Admitting: Cardiology

## 2023-05-27 ENCOUNTER — Emergency Department (HOSPITAL_BASED_OUTPATIENT_CLINIC_OR_DEPARTMENT_OTHER)
Admission: EM | Admit: 2023-05-27 | Discharge: 2023-05-27 | Disposition: A | Discharge status: Home / Self Care | Attending: Emergency Medicine | Admitting: Emergency Medicine

## 2023-05-27 ENCOUNTER — Encounter (HOSPITAL_BASED_OUTPATIENT_CLINIC_OR_DEPARTMENT_OTHER): Payer: Self-pay | Admitting: Emergency Medicine

## 2023-05-27 ENCOUNTER — Emergency Department (HOSPITAL_BASED_OUTPATIENT_CLINIC_OR_DEPARTMENT_OTHER)

## 2023-05-27 DIAGNOSIS — N6002 Solitary cyst of left breast: Secondary | ICD-10-CM | POA: Diagnosis not present

## 2023-05-27 DIAGNOSIS — I10 Essential (primary) hypertension: Secondary | ICD-10-CM | POA: Diagnosis not present

## 2023-05-27 DIAGNOSIS — Z79899 Other long term (current) drug therapy: Secondary | ICD-10-CM | POA: Diagnosis not present

## 2023-05-27 DIAGNOSIS — F17210 Nicotine dependence, cigarettes, uncomplicated: Secondary | ICD-10-CM | POA: Insufficient documentation

## 2023-05-27 DIAGNOSIS — L720 Epidermal cyst: Secondary | ICD-10-CM

## 2023-05-27 DIAGNOSIS — N644 Mastodynia: Secondary | ICD-10-CM | POA: Diagnosis present

## 2023-05-27 LAB — PREGNANCY, URINE: Preg Test, Ur: NEGATIVE

## 2023-05-27 MED ORDER — ACETAMINOPHEN 325 MG PO TABS: 650.0000 mg | ORAL_TABLET | Freq: Four times a day (QID) | ORAL | 0 refills | Status: DC | PRN

## 2023-05-27 MED ORDER — OXYCODONE HCL 5 MG PO TABS: 5.0000 mg | ORAL_TABLET | ORAL | 0 refills | Status: DC | PRN

## 2023-05-27 MED ORDER — DOXYCYCLINE HYCLATE 100 MG PO CAPS: 100.0000 mg | ORAL_CAPSULE | Freq: Two times a day (BID) | ORAL | 0 refills | Status: AC

## 2023-05-27 MED ORDER — HYDROCODONE-ACETAMINOPHEN 5-325 MG PO TABS
1.0000 | ORAL_TABLET | Freq: Once | ORAL | Status: AC
  Administered 2023-05-27: 1 via ORAL
  Filled 2023-05-27: qty 1

## 2023-05-27 MED ORDER — IBUPROFEN 600 MG PO TABS: 600.0000 mg | ORAL_TABLET | Freq: Four times a day (QID) | ORAL | 0 refills | Status: AC | PRN

## 2023-05-27 NOTE — Discharge Instructions (Addendum)
 It was a pleasure caring for you today in the emergency department.  You may be developing infection to left breast.  I have ordered antibiotics and pain medication to take at home.  Recommend using warm compresses as needed to help with discomfort.  Cleanse gently with soap and water  daily.  Please follow-up with breast center in 1 week and with your surgeon on Tuesday for your upcoming scheduled appointment.  Please return to the emergency department for any worsening or worrisome symptoms.

## 2023-05-27 NOTE — ED Triage Notes (Signed)
 Left nipple pain,  Seen at breast center told she has a cyst Pain and pus coming from left breast nipple

## 2023-05-27 NOTE — ED Provider Notes (Addendum)
 [place] EMERGENCY DEPARTMENT AT [facility] Provider Note  CSN: [id number] Arrival date & time: 05/27/23 1324  Chief Complaint(s) Breast Problem  HPI Tina Riggs is a 43 y.o. female with past medical history as below, significant for hypertension, pulmonary hypertension, obesity, diverticulitis who presents to the ED with complaint of breast pain and drainage  Patient had breast mass recently, she is evaluated breast center, was told she had a cyst.  She had a biopsy taken and was referred to a Careers adviser.  She missed her appointment but rescheduled for this coming Tuesday.  She feels since the evaluation the tissue has become more painful she is having purulent drainage from her nipple and worsening pain to the surrounding area.  Reports she had an abscess previously on her right breast that was drained approximate 20 years ago.  Reports that she has generalized malaise, no fevers.  No vomiting.  Past Medical History Past Medical History:  Diagnosis Date   Chronic cough    Diverticulitis    hospitalized 04/13/2014; hospitalized 04/29/2014   HPV in female    HTN (hypertension)    Pulmonary hypertension (HCC)    Sciatica of right side    Suppurative hidradenitis    Vaginal Pap smear, abnormal    Patient Active Problem List   Diagnosis Date Noted   History of cesarean section 05/02/2017   BMI 40.0-44.9, adult (HCC) 04/11/2017   Diverticular disease 06/11/2015   Diverticulitis of colon with perforation 04/13/2014   Tobacco use 04/13/2014   Home Medication(s) Prior to Admission medications   Medication Sig Start Date End Date Taking? Authorizing Provider  acetaminophen  (TYLENOL ) 325 MG tablet Take 2 tablets (650 mg total) by mouth every 6 (six) hours as needed. 05/27/23  Yes Teddi Favors, DO  doxycycline  (VIBRAMYCIN ) 100 MG capsule Take 1 capsule (100 mg total) by mouth 2 (two) times daily. 05/27/23  Yes Teddi Favors, DO  ibuprofen  (ADVIL ) 600 MG tablet Take 1 tablet  (600 mg total) by mouth every 6 (six) hours as needed. 05/27/23  Yes Russella Courts A, DO  oxyCODONE  (ROXICODONE ) 5 MG immediate release tablet Take 1 tablet (5 mg total) by mouth every 4 (four) hours as needed for severe pain (pain score 7-10). 05/27/23  Yes Teddi Favors, DO  albuterol  (VENTOLIN  HFA) 108 (90 Base) MCG/ACT inhaler Inhale 2 puffs into the lungs every 4 (four) hours as needed for wheezing or shortness of breath. Patient not taking: Reported on 03/16/2023 09/26/19   Hall-Potvin, [place], PA-C  amLODipine  (NORVASC ) 10 MG tablet Take 1 tablet (10 mg total) by mouth daily. 01/05/23   Jerrlyn Morel, NP  etonogestrel  (NEXPLANON ) 68 MG IMPL implant 1 each by Subdermal route once. 5 years    [provider]  fluticasone  (FLONASE ) 50 MCG/ACT nasal spray Place 1 spray into both nostrils daily. Patient not taking: Reported on 03/27/2023 09/26/19   Hall-Potvin, [place], PA-C  loratadine  (CLARITIN ) 10 MG tablet Take 1 tablet (10 mg total) by mouth daily. 03/27/23   Gabrielle Joiner, MD  metroNIDAZOLE  (METROGEL ) 0.75 % vaginal gel Place 1 Applicatorful vaginally 2 (two) times daily. 03/27/23   Gabrielle Joiner, MD  predniSONE  (DELTASONE ) 10 MG tablet Take 3 tablets (30 mg total) by mouth daily with breakfast. 05/11/23   Adolph Hoop, PA-C  Past Surgical History Past Surgical History:  Procedure Laterality Date   BREAST BIOPSY Left 05/08/2023   US  LT BREAST BX W LOC DEV 1ST LESION IMG BX SPEC US  GUIDE 05/08/2023 [facility] MAMMOGRAPHY   CESAREAN SECTION  02/2006   CESAREAN SECTION N/A 10/17/2017   Procedure: REPEAT CESAREAN SECTION;  Surgeon: Abner Ables, MD;  Location: [facility] BIRTHING SUITES;  Service: Obstetrics;  Laterality: N/A;   COLON SURGERY     COLONOSCOPY N/A 06/11/2015   Procedure: COLONOSCOPY;  Surgeon: Joyce Nixon, MD;  Location: [facility] ORS;  Service:  General;  Laterality: N/A;   COLOSTOMY N/A 05/18/2014   Procedure: COLOSTOMY;  Surgeon: Dorena Gander, MD;  Location: [facility] OR;  Service: General;  Laterality: N/A;   COLOSTOMY REVERSAL  06/2015   robotic assisted colostomy reversal with LOA/notes 06/16/2015   COLOSTOMY REVISION N/A 05/18/2014   Procedure: COLON RESECTION SIGMOID;  Surgeon: Dorena Gander, MD;  Location: [facility] OR;  Service: General;  Laterality: N/A;   COLPOSCOPY  Sep 2012   LAPAROSCOPIC LYSIS OF ADHESIONS N/A 05/18/2014   Procedure: LAPAROSCOPIC MOBILIZATION OF SPLENIC FLEXURE;  Surgeon: Dorena Gander, MD;  Location: [facility] OR;  Service: General;  Laterality: N/A;   Family History Family History  Problem Relation Age of Onset   HIV Mother    Hypertension Mother     Social History Social History   Tobacco Use   Smoking status: Every Day    Current packs/day: 0.50    Average packs/day: 0.5 packs/day for 18.0 years (9.0 ttl pk-yrs)    Types: Cigarettes   Smokeless tobacco: Never  Vaping Use   Vaping status: Former  Substance Use Topics   Alcohol use: Yes    Alcohol/week: 0.0 standard drinks of alcohol    Comment: wine   Drug use: No   Allergies Penicillins and Robaxin  [methocarbamol ]  Review of Systems A thorough review of systems was obtained and all systems are negative except as noted in the HPI and PMH.   Physical Exam Vital Signs  I have reviewed the triage vital signs BP 129/80   Pulse 84   Temp 98.5 F (36.9 C) (Oral)   Resp 16   SpO2 100%  Physical Exam Vitals and nursing note reviewed. Exam conducted with a chaperone present (ROBIN EDT).  Constitutional:      General: She is not in acute distress.    Appearance: Normal appearance. She is well-developed. She is not ill-appearing.  HENT:     Head: Normocephalic and atraumatic.     Right Ear: External ear normal.     Left Ear: External ear normal.     Nose: Nose normal.     Mouth/Throat:     Mouth: Mucous membranes are moist.  Eyes:     General:  No scleral icterus.       Right eye: No discharge.        Left eye: No discharge.  Cardiovascular:     Rate and Rhythm: Normal rate.  Pulmonary:     Effort: Pulmonary effort is normal. No respiratory distress.     Breath sounds: No stridor.  Chest:       Comments: Purulent drainage noted from areola approximately 9 o'clock position  There is erythema surrounding the areola, palpable mass. Abdominal:     General: Abdomen is flat. There is no distension.     Tenderness: There is no guarding.  Musculoskeletal:        General: No deformity.     Cervical back: No  rigidity.  Skin:    General: Skin is warm and dry.     Coloration: Skin is not cyanotic, jaundiced or pale.  Neurological:     Mental Status: She is alert and oriented to person, place, and time.     GCS: GCS eye subscore is 4. GCS verbal subscore is 5. GCS motor subscore is 6.  Psychiatric:        Speech: Speech normal.        Behavior: Behavior normal. Behavior is cooperative.     ED Results and Treatments Labs (all labs ordered are listed, but only abnormal results are displayed) Labs Reviewed  PREGNANCY, URINE                                                                                                                          Radiology US  LIMITED ULTRASOUND INCLUDING AXILLA LEFT BREAST  Result Date: 05/27/2023 CLINICAL DATA:  Patient with recent history of biopsy-proven 2.2 cm epidermal inclusion cyst within the left nipple/retroareolar region 05/08/2023. Patient returned breast Center 05/24/2023 with increasing left periareolar pain and swelling at the site of the previous biopsy. Surgical consultation was arranged with [facility] surgery as it is unclear whether patient followed up with her surgical appointment. Patient presents today with left nipple pain and nipple discharge of purulent material with swelling. EXAM: ULTRASOUND OF THE LEFT BREAST COMPARISON:  Previous exam(s). FINDINGS: Ultrasound  examination was performed through the ER without subspecialty breast imaging radiologist present during the exam. Targeted ultrasound is performed, showing continued evidence of an inflamed left nipple with oval heterogeneous hypoechoic collection within the nipple base measuring approximately 1.3 x 1 x 1.5 cm (previously 1.6 x 2.2 x 2.2 cm). Moderate adjacent skin thickening of the periareolar region. No definite evidence of abscess deep to the nipple. IMPRESSION: Interval decrease in size of biopsy-proven epidermal inclusion cyst left nipple base. Adjacent skin thickening of the periareolar region. This may be due to superimposed infection versus inflammatory reaction post biopsy. RECOMMENDATION: Consider 1 week empiric course of antibiotics (suggest doxycycline ) as well as medication for pain management. Recommend follow-up at the breast Center of [facility] 1 week and also recommend follow-up with patient's previously recommended surgical consultation. I have discussed the findings and recommendations with the patient. If applicable, a reminder letter will be sent to the patient regarding the next appointment. BI-RADS CATEGORY  2: Benign. Electronically Signed   By: Roda Cirri M.D.   On: 05/27/2023 17:22    Pertinent labs & imaging results that were available during my care of the patient were reviewed by me and considered in my medical decision making (see MDM for details).  Medications Ordered in ED Medications  HYDROcodone -acetaminophen  (NORCO/VICODIN) 5-325 MG per tablet 1 tablet (1 tablet Oral Given 05/27/23 1612)  Procedures Procedures  (including critical care time)  Medical Decision Making / ED Course    Medical Decision Making:    AMARIONA RATHJE is a 43 y.o. female with past medical history as below, significant for hypertension, Hidradenitis  suppurativa, pulmonary hypertension, obesity, diverticulitis who presents to the ED with complaint of breast pain and drainage. The complaint involves an extensive differential diagnosis and also carries with it a high risk of complications and morbidity.  Serious etiology was considered. Ddx includes but is not limited to: abscess, mastitis, neoplasm, cellulitis, etc  Complete initial physical exam performed, notably the patient was in no distress.    Reviewed and confirmed nursing documentation for past medical history, family history, social history.  Vital signs reviewed.     Brief summary:  43 yo/f here with hx as above here with ongoing left breast pain Recent eval at breast clinic with mammogram, told she had a cyst and advised to f/u surgery, has appt on Tuesday Breast pain worsening, now has surrounding erythema, purulent drainage, seems to be worsening Will get ultrasound, give analgesics   Clinical Course as of 05/27/23 1847  Sun May 27, 2023  1755 Ultrasound has resulted, reviewed results. Will start on abx and plan for o/p f/u [SG]    Clinical Course User Index [SG] Russella Courts A, DO     Pain is improved, no fever, HDS doubt sepsis Recent diagnosis of inclusion cyst to left breast, pt feels like worsening Reviewed ultrasound, agree with radiology recommendations.  Will check pregnancy.  Doxycycline  for home, preg neg.  She has follow-up with surgery this coming week, advised follow-up with breast center in 1 week  Patient in no distress and overall condition improved here in the ED. Detailed discussions were had with the patient/guardian regarding current findings, and need for close f/u with PCP or on call doctor. The patient/guardian has been instructed to return immediately if the symptoms worsen in any way for re-evaluation. Patient/guardian verbalized understanding and is in agreement with current care plan. All questions answered prior to  discharge.              Additional history obtained: -Additional history obtained from family -External records from outside source obtained and reviewed including: Chart review including previous notes, labs, imaging, consultation notes including  Prior urgent care documentation Home meds Primary care documentation   Lab Tests: -I ordered, reviewed, and interpreted labs.   The pertinent results include:   Labs Reviewed  PREGNANCY, URINE     EKG   EKG Interpretation Date/Time:    Ventricular Rate:    PR Interval:    QRS Duration:    QT Interval:    QTC Calculation:   R Axis:      Text Interpretation:           Imaging Studies ordered: I ordered imaging studies including breast u/s I independently visualized the following imaging with scope of interpretation limited to determining acute life threatening conditions related to emergency care; findings noted above I agree with the radiologist interpretation If any imaging was obtained with contrast I closely monitored patient for any possible adverse reaction a/w contrast administration in the emergency department   Medicines ordered and prescription drug management: Meds ordered this encounter  Medications   HYDROcodone -acetaminophen  (NORCO/VICODIN) 5-325 MG per tablet 1 tablet    Refill:  0   doxycycline  (VIBRAMYCIN ) 100 MG capsule    Sig: Take 1 capsule (100 mg total) by mouth 2 (two) times daily.  Dispense:  20 capsule    Refill:  0   oxyCODONE  (ROXICODONE ) 5 MG immediate release tablet    Sig: Take 1 tablet (5 mg total) by mouth every 4 (four) hours as needed for severe pain (pain score 7-10).    Dispense:  10 tablet    Refill:  0   acetaminophen  (TYLENOL ) 325 MG tablet    Sig: Take 2 tablets (650 mg total) by mouth every 6 (six) hours as needed.    Dispense:  36 tablet    Refill:  0   ibuprofen  (ADVIL ) 600 MG tablet    Sig: Take 1 tablet (600 mg total) by mouth every 6 (six) hours as needed.     Dispense:  30 tablet    Refill:  0    -I have reviewed the patients home medicines and have made adjustments as needed   Consultations Obtained: na   Cardiac Monitoring: Continuous pulse oximetry interpreted by myself, 97% on RA.    Social Determinants of Health:  Diagnosis or treatment significantly limited by social determinants of health: current smoker and obesity Counseled patient for approximately 3 minutes regarding smoking cessation. Discussed risks of smoking and how they applied and affected their visit here today. Patient not ready to quit at this time, however will follow up with their primary doctor when they are.   CPT code: 14782: intermediate counseling for smoking cessation     Reevaluation: After the interventions noted above, I reevaluated the patient and found that they have improved  Co morbidities that complicate the patient evaluation  Past Medical History:  Diagnosis Date   Chronic cough    Diverticulitis    hospitalized 04/13/2014; hospitalized 04/29/2014   HPV in female    HTN (hypertension)    Pulmonary hypertension (HCC)    Sciatica of right side    Suppurative hidradenitis    Vaginal Pap smear, abnormal       Dispostion: Disposition decision including need for hospitalization was considered, and patient discharged from emergency department.    Final Clinical Impression(s) / ED Diagnoses Final diagnoses:  Breast pain  Inclusion cyst of left breast        Teddi Favors, DO 05/27/23 1847    Teddi Favors, DO 05/27/23 1847

## 2023-05-29 ENCOUNTER — Other Ambulatory Visit: Payer: Self-pay | Admitting: Obstetrics

## 2023-05-29 ENCOUNTER — Ambulatory Visit: Payer: Self-pay | Admitting: Surgery

## 2023-05-29 DIAGNOSIS — N644 Mastodynia: Secondary | ICD-10-CM

## 2023-05-29 NOTE — H&P (Signed)
 Subjective    Chief Complaint: Left Breast Cyst       History of Present Illness: Tina Riggs is a 43 y.o. female who is seen today as an office consultation at the request of Dr. April Knack for evaluation of Left Breast Cyst .   This is a 43 year old female who presented 2 months ago with a visible palpable lump involving the left nipple.  She denied any discharge.  She underwent diagnostic mammogram and ultrasound.  Mammogram was unremarkable.  Ultrasound showed 2.2 cm mass that appeared to be a sebaceous cyst.  Subsequently she underwent ultrasound biopsy that revealed a sebaceous cyst.  The patient developed cellulitis and some drainage from this area.  It has become very tender.  She presents now to discuss surgical intervention.   Several years ago, the patient underwent Hartman's procedure for perforated diverticulitis.  Her colostomy was reversed by Dr. Andy Bannister in 2017.  She has developed multiple ventral hernias that have enlarged over the last several years.  This has become fairly uncomfortable.  She also has a 4.5 cm calcified fibroid of the uterus.   Review of Systems: A complete review of systems was obtained from the patient.  I have reviewed this information and discussed as appropriate with the patient.  See HPI as well for other ROS.   Review of Systems  Constitutional:  Positive for chills.  HENT: Negative.    Eyes: Negative.   Respiratory: Negative.    Cardiovascular: Negative.   Gastrointestinal:  Positive for abdominal pain.  Genitourinary: Negative.   Musculoskeletal: Negative.   Skin: Negative.   Neurological: Negative.   Endo/Heme/Allergies: Negative.   Psychiatric/Behavioral: Negative.          Medical History: Past Medical History      Past Medical History:  Diagnosis Date   Hypertension          Problem List     Patient Active Problem List  Diagnosis   BMI 40.0-44.9, adult (CMS/HHS-HCC)   Diverticular disease   Diverticulitis of colon with  perforation   History of cesarean section   Tobacco use        Past Surgical History       Past Surgical History:  Procedure Laterality Date   COLPOSCOPY   09/2010   COLON SURGERY   06/11/2015   CESAREAN SECTION   10/17/2017        Allergies       Allergies  Allergen Reactions   Methocarbamol  Other (See Comments) and Swelling      Bottom lip swelled   Penicillins Itching and Rash      Has patient had a PCN reaction causing immediate rash, facial/tongue/throat swelling, SOB or lightheadedness with hypotension: yes  Has patient had a PCN reaction causing severe rash involving mucus membranes or skin necrosis: no  Has patient had a PCN reaction that required hospitalization no  Has patient had a PCN reaction occurring within the last 10 years: no  If all of the above answers are "NO", then may proceed with Cephalosporin use.  Pt states she has tolerated Amoxicillin  in the past        Medications Ordered Prior to Encounter        Current Outpatient Medications on File Prior to Visit  Medication Sig Dispense Refill   doxycycline  (VIBRAMYCIN ) 100 MG capsule Take 100 mg by mouth 2 (two) times daily       ibuprofen  (MOTRIN ) 600 MG tablet Take 600 mg by mouth every 6 (six)  hours as needed       amLODIPine  (NORVASC ) 10 MG tablet Take 10 mg by mouth once daily        No current facility-administered medications on file prior to visit.        Family History       Family History  Problem Relation Age of Onset   High blood pressure (Hypertension) Mother     HIV Mother     High blood pressure (Hypertension) Sister          Tobacco Use History  Social History        Tobacco Use  Smoking Status Every Day   Current packs/day: 0.50   Types: Cigarettes  Smokeless Tobacco Never        Social History  Social History         Socioeconomic History   Marital status: Single  Tobacco Use   Smoking status: Every Day      Current packs/day: 0.50      Types: Cigarettes    Smokeless tobacco: Never  Substance and Sexual Activity   Alcohol use: Yes      Comment: at least 25oz a day   Drug use: Never    Social Drivers of Acupuncturist Strain: Medium Risk (01/05/2023)    Received from [facility]    Overall Financial Resource Strain (CARDIA)     Difficulty of Paying Living Expenses: Somewhat hard  Food Insecurity: Food Insecurity Present (01/05/2023)    Received from [facility]    Hunger Vital Sign     Worried About Running Out of Food in the Last Year: Often true     Ran Out of Food in the Last Year: Never true  Transportation Needs: Unmet Transportation Needs (01/05/2023)    Received from [facility] - Transportation     Lack of Transportation (Medical): No     Lack of Transportation (Non-Medical): Yes  Physical Activity: Insufficiently Active (01/05/2023)    Received from [facility]    Exercise Vital Sign     Days of Exercise per Week: 1 day     Minutes of Exercise per Session: 10 min  Stress: No Stress Concern Present (01/05/2023)    Received from [facility] - Occupational Stress Questionnaire     Feeling of Stress : Only a little  Social Connections: Moderately Integrated (01/05/2023)    Received from [facility]    Social Connection and Isolation Panel [NHANES]     Frequency of Communication with Friends and Family: More than three times a week     Frequency of Social Gatherings with Friends and Family: Twice a week     Attends Religious Services: More than 4 times per year     Active Member of [employer] or Organizations: Yes     Attends Banker Meetings: More than 4 times per year     Marital Status: Never married  Housing Stability: Unknown (01/05/2023)    Received from [facility]    Housing Stability Vital Sign     Unable to Pay for Housing in the Last Year: No     Homeless in the Last Year: No        Objective:         Vitals:    05/29/23 1356  BP: (!)  156/100  Pulse: 82  Temp: 36.7 C (98.1 F)  SpO2: 96%  Weight: (!) 129.9 kg (286 lb 6.4 oz)  Height: 168.9 cm (5' 6.5")  PainSc:   2    Body mass index is 45.53 kg/m.   Physical Exam    Constitutional:  WDWN in NAD, conversant, no obvious deformities; lying in bed comfortably Eyes:  Pupils equal, round; sclera anicteric; moist conjunctiva; no lid lag HENT:  Oral mucosa moist; good dentition  Neck:  No masses palpated, trachea midline; no thyromegaly Lungs:  CTA bilaterally; normal respiratory effort Breasts:  symmetric, no nipple changes on right; no palpable masses or lymphadenopathy on right side Left breast shows the central breast is mildly erythematous and thickened.  There is an obvious 2 cm cyst protruding from the lateral side of the nipple with a 1 cm open wound.  There is some granulation but also some drainage from this area.  This is exquisitely tender. CV:  Regular rate and rhythm; no murmurs; extremities well-perfused with no edema Abd:  +bowel sounds, soft, non-tender, no palpable organomegaly; no palpable hernias Musc:  Unable to assess gait; no apparent clubbing or cyanosis in extremities Lymphatic:  No palpable cervical or axillary lymphadenopathy Skin:  Warm, dry; no sign of jaundice Psychiatric - alert and oriented x 4; calm mood and affect     Labs, Imaging and Diagnostic Testing:   FINAL DIAGNOSIS        1. Breast, left, needle core biopsy, Ribbon clip :       -  EPIDERMAL INCLUSION CYST (SQUAMOUS LINED CYST WITH ABUNDANT KERATINACEOUS       DEBRIS).    CLINICAL DATA:  43 year old presenting with a visible and palpable lump involving the LEFT nipple. Annual evaluation, RIGHT breast. The patient denies a nipple discharge.   The patient was worked up in December, 2017, for likely benign masses in both breasts. There has been no interval imaging.   EXAM: DIGITAL DIAGNOSTIC BILATERAL MAMMOGRAM WITH TOMOSYNTHESIS AND CAD; ULTRASOUND LEFT BREAST LIMITED    TECHNIQUE: Bilateral digital diagnostic mammography and breast tomosynthesis was performed. The images were evaluated with computer-aided detection. ; Targeted ultrasound examination of the left breast was performed.   COMPARISON:  Previous exam(s).   ACR Breast Density Category a: The breasts are almost entirely fatty.   FINDINGS: Full field CC and MLO views of both breasts were obtained and a spot compression CC view of the LEFT nipple and subareolar LEFT breast were obtained.   RIGHT: No findings suspicious for malignancy. The previously identified mass in the lower subareolar location is unchanged compared to the 2017 mammogram, confirming benignity. The previously identified mass in the inner breast at anterior to middle depth has decreased in size in the interval, confirming benignity.   LEFT: No findings suspicious for malignancy. No mammographic abnormality involving the nipple or the subareolar location. The previously identified mass in the outer breast at anterior depth represents an oil cyst which has decreased in size since the 2017 mammogram, confirming benignity.   Targeted ultrasound is performed of the nipple and subareolar location, demonstrating a circumscribed oval parallel hypoechoic mass involving the nipple extending into the subareolar location measuring approximately 2.2 x 1.6 x 2.2 cm, demonstrating posterior acoustic enhancement and peripheral power Doppler flow.   On correlative physical examination, the LEFT nipple is significantly larger than the RIGHT. There is a soft palpable mass within the nipple. I did not express a nipple discharge with nipple compression. There are prominent papillae on the nipple as well.   IMPRESSION: 1. Approximate  2.2 cm mass involving the LEFT nipple, possibly a nipple adenoma. 2. No mammographic evidence of malignancy involving the RIGHT breast.   RECOMMENDATION: Ultrasound-guided core needle biopsy of the  mass involving the LEFT nipple. Since the mass extends into the subareolar location, needle biopsy should be technically possible.   I have discussed the findings and recommendations with the patient. The ultrasound core needle biopsy procedure was discussed with the patient and her questions were answered. She wishes to proceed with the biopsy which has been scheduled by the [facility] staff.   BI-RADS CATEGORY  3: Probably benign.   However, tissue sampling is needed to confirm benignity.     Electronically Signed   By: Rinda Cheers M.D.   On: 04/20/2023 15:26   Assessment and Plan:  Diagnoses and all orders for this visit:   Sebaceous cyst of skin of left breast   Ventral incisional hernia     Excision of sebaceous cyst of the left breast Recommend incision and drainage of infected sebaceous cyst of the left breast urgently.   The surgical procedure has been discussed with the patient.  Potential risks, benefits, alternative treatments, and expected outcomes have been explained.  All of the patient's questions at this time have been answered.  The likelihood of reaching the patient's treatment goal is good.  The patient understands the proposed surgical procedure and wishes to proceed.   The patient will need to have her ventral hernias addressed at a later time when she does not have active infection.   Janai Maudlin Jannelle Memory, MD  05/29/2023 2:42 PM

## 2023-05-29 NOTE — H&P (View-Only) (Signed)
 Subjective    Chief Complaint: Left Breast Cyst       History of Present Illness: Tina Riggs is a 43 y.o. female who is seen today as an office consultation at the request of Dr. April Knack for evaluation of Left Breast Cyst .   This is a 43 year old female who presented 2 months ago with a visible palpable lump involving the left nipple.  She denied any discharge.  She underwent diagnostic mammogram and ultrasound.  Mammogram was unremarkable.  Ultrasound showed 2.2 cm mass that appeared to be a sebaceous cyst.  Subsequently she underwent ultrasound biopsy that revealed a sebaceous cyst.  The patient developed cellulitis and some drainage from this area.  It has become very tender.  She presents now to discuss surgical intervention.   Several years ago, the patient underwent Hartman's procedure for perforated diverticulitis.  Her colostomy was reversed by Dr. Andy Bannister in 2017.  She has developed multiple ventral hernias that have enlarged over the last several years.  This has become fairly uncomfortable.  She also has a 4.5 cm calcified fibroid of the uterus.   Review of Systems: A complete review of systems was obtained from the patient.  I have reviewed this information and discussed as appropriate with the patient.  See HPI as well for other ROS.   Review of Systems  Constitutional:  Positive for chills.  HENT: Negative.    Eyes: Negative.   Respiratory: Negative.    Cardiovascular: Negative.   Gastrointestinal:  Positive for abdominal pain.  Genitourinary: Negative.   Musculoskeletal: Negative.   Skin: Negative.   Neurological: Negative.   Endo/Heme/Allergies: Negative.   Psychiatric/Behavioral: Negative.          Medical History: Past Medical History      Past Medical History:  Diagnosis Date   Hypertension          Problem List     Patient Active Problem List  Diagnosis   BMI 40.0-44.9, adult (CMS/HHS-HCC)   Diverticular disease   Diverticulitis of colon with  perforation   History of cesarean section   Tobacco use        Past Surgical History       Past Surgical History:  Procedure Laterality Date   COLPOSCOPY   09/2010   COLON SURGERY   06/11/2015   CESAREAN SECTION   10/17/2017        Allergies       Allergies  Allergen Reactions   Methocarbamol  Other (See Comments) and Swelling      Bottom lip swelled   Penicillins Itching and Rash      Has patient had a PCN reaction causing immediate rash, facial/tongue/throat swelling, SOB or lightheadedness with hypotension: yes  Has patient had a PCN reaction causing severe rash involving mucus membranes or skin necrosis: no  Has patient had a PCN reaction that required hospitalization no  Has patient had a PCN reaction occurring within the last 10 years: no  If all of the above answers are "NO", then may proceed with Cephalosporin use.  Pt states she has tolerated Amoxicillin  in the past        Medications Ordered Prior to Encounter        Current Outpatient Medications on File Prior to Visit  Medication Sig Dispense Refill   doxycycline  (VIBRAMYCIN ) 100 MG capsule Take 100 mg by mouth 2 (two) times daily       ibuprofen  (MOTRIN ) 600 MG tablet Take 600 mg by mouth every 6 (six)  hours as needed       amLODIPine  (NORVASC ) 10 MG tablet Take 10 mg by mouth once daily        No current facility-administered medications on file prior to visit.        Family History       Family History  Problem Relation Age of Onset   High blood pressure (Hypertension) Mother     HIV Mother     High blood pressure (Hypertension) Sister          Tobacco Use History  Social History        Tobacco Use  Smoking Status Every Day   Current packs/day: 0.50   Types: Cigarettes  Smokeless Tobacco Never        Social History  Social History         Socioeconomic History   Marital status: Single  Tobacco Use   Smoking status: Every Day      Current packs/day: 0.50      Types: Cigarettes    Smokeless tobacco: Never  Substance and Sexual Activity   Alcohol use: Yes      Comment: at least 25oz a day   Drug use: Never    Social Drivers of Acupuncturist Strain: Medium Risk (01/05/2023)    Received from [facility]    Overall Financial Resource Strain (CARDIA)     Difficulty of Paying Living Expenses: Somewhat hard  Food Insecurity: Food Insecurity Present (01/05/2023)    Received from [facility]    Hunger Vital Sign     Worried About Running Out of Food in the Last Year: Often true     Ran Out of Food in the Last Year: Never true  Transportation Needs: Unmet Transportation Needs (01/05/2023)    Received from [facility] - Transportation     Lack of Transportation (Medical): No     Lack of Transportation (Non-Medical): Yes  Physical Activity: Insufficiently Active (01/05/2023)    Received from [facility]    Exercise Vital Sign     Days of Exercise per Week: 1 day     Minutes of Exercise per Session: 10 min  Stress: No Stress Concern Present (01/05/2023)    Received from [facility] - Occupational Stress Questionnaire     Feeling of Stress : Only a little  Social Connections: Moderately Integrated (01/05/2023)    Received from [facility]    Social Connection and Isolation Panel [NHANES]     Frequency of Communication with Friends and Family: More than three times a week     Frequency of Social Gatherings with Friends and Family: Twice a week     Attends Religious Services: More than 4 times per year     Active Member of [employer] or Organizations: Yes     Attends Banker Meetings: More than 4 times per year     Marital Status: Never married  Housing Stability: Unknown (01/05/2023)    Received from [facility]    Housing Stability Vital Sign     Unable to Pay for Housing in the Last Year: No     Homeless in the Last Year: No        Objective:         Vitals:    05/29/23 1356  BP: (!)  156/100  Pulse: 82  Temp: 36.7 C (98.1 F)  SpO2: 96%  Weight: (!) 129.9 kg (286 lb 6.4 oz)  Height: 168.9 cm (5' 6.5")  PainSc:   2    Body mass index is 45.53 kg/m.   Physical Exam    Constitutional:  WDWN in NAD, conversant, no obvious deformities; lying in bed comfortably Eyes:  Pupils equal, round; sclera anicteric; moist conjunctiva; no lid lag HENT:  Oral mucosa moist; good dentition  Neck:  No masses palpated, trachea midline; no thyromegaly Lungs:  CTA bilaterally; normal respiratory effort Breasts:  symmetric, no nipple changes on right; no palpable masses or lymphadenopathy on right side Left breast shows the central breast is mildly erythematous and thickened.  There is an obvious 2 cm cyst protruding from the lateral side of the nipple with a 1 cm open wound.  There is some granulation but also some drainage from this area.  This is exquisitely tender. CV:  Regular rate and rhythm; no murmurs; extremities well-perfused with no edema Abd:  +bowel sounds, soft, non-tender, no palpable organomegaly; no palpable hernias Musc:  Unable to assess gait; no apparent clubbing or cyanosis in extremities Lymphatic:  No palpable cervical or axillary lymphadenopathy Skin:  Warm, dry; no sign of jaundice Psychiatric - alert and oriented x 4; calm mood and affect     Labs, Imaging and Diagnostic Testing:   FINAL DIAGNOSIS        1. Breast, left, needle core biopsy, Ribbon clip :       -  EPIDERMAL INCLUSION CYST (SQUAMOUS LINED CYST WITH ABUNDANT KERATINACEOUS       DEBRIS).    CLINICAL DATA:  43 year old presenting with a visible and palpable lump involving the LEFT nipple. Annual evaluation, RIGHT breast. The patient denies a nipple discharge.   The patient was worked up in December, 2017, for likely benign masses in both breasts. There has been no interval imaging.   EXAM: DIGITAL DIAGNOSTIC BILATERAL MAMMOGRAM WITH TOMOSYNTHESIS AND CAD; ULTRASOUND LEFT BREAST LIMITED    TECHNIQUE: Bilateral digital diagnostic mammography and breast tomosynthesis was performed. The images were evaluated with computer-aided detection. ; Targeted ultrasound examination of the left breast was performed.   COMPARISON:  Previous exam(s).   ACR Breast Density Category a: The breasts are almost entirely fatty.   FINDINGS: Full field CC and MLO views of both breasts were obtained and a spot compression CC view of the LEFT nipple and subareolar LEFT breast were obtained.   RIGHT: No findings suspicious for malignancy. The previously identified mass in the lower subareolar location is unchanged compared to the 2017 mammogram, confirming benignity. The previously identified mass in the inner breast at anterior to middle depth has decreased in size in the interval, confirming benignity.   LEFT: No findings suspicious for malignancy. No mammographic abnormality involving the nipple or the subareolar location. The previously identified mass in the outer breast at anterior depth represents an oil cyst which has decreased in size since the 2017 mammogram, confirming benignity.   Targeted ultrasound is performed of the nipple and subareolar location, demonstrating a circumscribed oval parallel hypoechoic mass involving the nipple extending into the subareolar location measuring approximately 2.2 x 1.6 x 2.2 cm, demonstrating posterior acoustic enhancement and peripheral power Doppler flow.   On correlative physical examination, the LEFT nipple is significantly larger than the RIGHT. There is a soft palpable mass within the nipple. I did not express a nipple discharge with nipple compression. There are prominent papillae on the nipple as well.   IMPRESSION: 1. Approximate  2.2 cm mass involving the LEFT nipple, possibly a nipple adenoma. 2. No mammographic evidence of malignancy involving the RIGHT breast.   RECOMMENDATION: Ultrasound-guided core needle biopsy of the  mass involving the LEFT nipple. Since the mass extends into the subareolar location, needle biopsy should be technically possible.   I have discussed the findings and recommendations with the patient. The ultrasound core needle biopsy procedure was discussed with the patient and her questions were answered. She wishes to proceed with the biopsy which has been scheduled by the [facility] staff.   BI-RADS CATEGORY  3: Probably benign.   However, tissue sampling is needed to confirm benignity.     Electronically Signed   By: Rinda Cheers M.D.   On: 04/20/2023 15:26   Assessment and Plan:  Diagnoses and all orders for this visit:   Sebaceous cyst of skin of left breast   Ventral incisional hernia     Excision of sebaceous cyst of the left breast Recommend incision and drainage of infected sebaceous cyst of the left breast urgently.   The surgical procedure has been discussed with the patient.  Potential risks, benefits, alternative treatments, and expected outcomes have been explained.  All of the patient's questions at this time have been answered.  The likelihood of reaching the patient's treatment goal is good.  The patient understands the proposed surgical procedure and wishes to proceed.   The patient will need to have her ventral hernias addressed at a later time when she does not have active infection.   Janai Maudlin Jannelle Memory, MD  05/29/2023 2:42 PM

## 2023-05-30 ENCOUNTER — Encounter (HOSPITAL_BASED_OUTPATIENT_CLINIC_OR_DEPARTMENT_OTHER): Payer: Self-pay | Admitting: Surgery

## 2023-05-30 ENCOUNTER — Other Ambulatory Visit: Payer: Self-pay

## 2023-05-31 MED ORDER — CHLORHEXIDINE GLUCONATE CLOTH 2 % EX PADS: 6.0000 | MEDICATED_PAD | Freq: Once | CUTANEOUS | Status: DC

## 2023-05-31 NOTE — Progress Notes (Signed)

## 2023-06-01 ENCOUNTER — Encounter (HOSPITAL_BASED_OUTPATIENT_CLINIC_OR_DEPARTMENT_OTHER): Payer: Self-pay | Admitting: Surgery

## 2023-06-01 ENCOUNTER — Other Ambulatory Visit: Payer: Self-pay

## 2023-06-01 ENCOUNTER — Encounter (HOSPITAL_BASED_OUTPATIENT_CLINIC_OR_DEPARTMENT_OTHER)
Admission: RE | Disposition: A | Discharge status: Home / Self Care | Payer: Self-pay | Source: Home / Self Care | Attending: Surgery

## 2023-06-01 ENCOUNTER — Ambulatory Visit (HOSPITAL_BASED_OUTPATIENT_CLINIC_OR_DEPARTMENT_OTHER)
Admission: RE | Admit: 2023-06-01 | Discharge: 2023-06-01 | Disposition: A | Discharge status: Home / Self Care | Attending: Surgery | Admitting: Surgery

## 2023-06-01 ENCOUNTER — Ambulatory Visit (HOSPITAL_BASED_OUTPATIENT_CLINIC_OR_DEPARTMENT_OTHER): Admitting: Anesthesiology

## 2023-06-01 DIAGNOSIS — L723 Sebaceous cyst: Secondary | ICD-10-CM | POA: Insufficient documentation

## 2023-06-01 DIAGNOSIS — N6002 Solitary cyst of left breast: Secondary | ICD-10-CM

## 2023-06-01 DIAGNOSIS — K432 Incisional hernia without obstruction or gangrene: Secondary | ICD-10-CM | POA: Diagnosis not present

## 2023-06-01 DIAGNOSIS — N289 Disorder of kidney and ureter, unspecified: Secondary | ICD-10-CM | POA: Diagnosis not present

## 2023-06-01 DIAGNOSIS — Z8249 Family history of ischemic heart disease and other diseases of the circulatory system: Secondary | ICD-10-CM | POA: Diagnosis not present

## 2023-06-01 DIAGNOSIS — D259 Leiomyoma of uterus, unspecified: Secondary | ICD-10-CM | POA: Insufficient documentation

## 2023-06-01 DIAGNOSIS — F17210 Nicotine dependence, cigarettes, uncomplicated: Secondary | ICD-10-CM | POA: Insufficient documentation

## 2023-06-01 DIAGNOSIS — I10 Essential (primary) hypertension: Secondary | ICD-10-CM | POA: Diagnosis not present

## 2023-06-01 DIAGNOSIS — G4730 Sleep apnea, unspecified: Secondary | ICD-10-CM | POA: Diagnosis not present

## 2023-06-01 DIAGNOSIS — I272 Pulmonary hypertension, unspecified: Secondary | ICD-10-CM | POA: Diagnosis not present

## 2023-06-01 DIAGNOSIS — L720 Epidermal cyst: Secondary | ICD-10-CM | POA: Insufficient documentation

## 2023-06-01 DIAGNOSIS — Z01818 Encounter for other preprocedural examination: Secondary | ICD-10-CM

## 2023-06-01 DIAGNOSIS — G709 Myoneural disorder, unspecified: Secondary | ICD-10-CM | POA: Insufficient documentation

## 2023-06-01 HISTORY — PX: INCISION AND DRAINAGE, ABSCESS, BREAST: SHX7594

## 2023-06-01 LAB — POCT PREGNANCY, URINE: Preg Test, Ur: NEGATIVE

## 2023-06-01 SURGERY — INCISION AND DRAINAGE, ABSCESS, BREAST
Anesthesia: General | Site: Breast | Laterality: Left

## 2023-06-01 MED ORDER — OXYCODONE HCL 5 MG/5ML PO SOLN: 5.0000 mg | Freq: Once | ORAL | Status: DC | PRN

## 2023-06-01 MED ORDER — 0.9 % SODIUM CHLORIDE (POUR BTL) OPTIME
TOPICAL | Status: DC | PRN
  Administered 2023-06-01: 1000 mL

## 2023-06-01 MED ORDER — FENTANYL CITRATE (PF) 100 MCG/2ML IJ SOLN
INTRAMUSCULAR | Status: AC
  Filled 2023-06-01: qty 2

## 2023-06-01 MED ORDER — ACETAMINOPHEN 500 MG PO TABS
1000.0000 mg | ORAL_TABLET | ORAL | Status: AC
  Administered 2023-06-01: 1000 mg via ORAL

## 2023-06-01 MED ORDER — BUPIVACAINE-EPINEPHRINE 0.25% -1:200000 IJ SOLN
INTRAMUSCULAR | Status: DC | PRN
Start: 2023-06-01 — End: 2023-06-01
  Administered 2023-06-01: 10 mL

## 2023-06-01 MED ORDER — PROPOFOL 10 MG/ML IV BOLUS
INTRAVENOUS | Status: AC
  Filled 2023-06-01: qty 20

## 2023-06-01 MED ORDER — ONDANSETRON HCL 4 MG/2ML IJ SOLN: 4.0000 mg | Freq: Once | INTRAMUSCULAR | Status: DC | PRN

## 2023-06-01 MED ORDER — MIDAZOLAM HCL 2 MG/2ML IJ SOLN
INTRAMUSCULAR | Status: AC
Start: 2023-06-01 — End: ?
  Filled 2023-06-01: qty 2

## 2023-06-01 MED ORDER — DEXAMETHASONE SODIUM PHOSPHATE 10 MG/ML IJ SOLN
INTRAMUSCULAR | Status: DC | PRN
  Administered 2023-06-01: 10 mg via INTRAVENOUS

## 2023-06-01 MED ORDER — LIDOCAINE HCL (CARDIAC) PF 100 MG/5ML IV SOSY
PREFILLED_SYRINGE | INTRAVENOUS | Status: DC | PRN
  Administered 2023-06-01: 100 mg via INTRAVENOUS

## 2023-06-01 MED ORDER — FENTANYL CITRATE (PF) 100 MCG/2ML IJ SOLN
INTRAMUSCULAR | Status: DC | PRN
  Administered 2023-06-01 (×2): 50 ug via INTRAVENOUS

## 2023-06-01 MED ORDER — KETOROLAC TROMETHAMINE 30 MG/ML IJ SOLN
INTRAMUSCULAR | Status: DC | PRN
  Administered 2023-06-01: 30 mg via INTRAVENOUS

## 2023-06-01 MED ORDER — PROPOFOL 10 MG/ML IV BOLUS
INTRAVENOUS | Status: DC | PRN
  Administered 2023-06-01: 180 mg via INTRAVENOUS

## 2023-06-01 MED ORDER — ACETAMINOPHEN 500 MG PO TABS
ORAL_TABLET | ORAL | Status: AC
  Filled 2023-06-01: qty 2

## 2023-06-01 MED ORDER — KETOROLAC TROMETHAMINE 30 MG/ML IJ SOLN
INTRAMUSCULAR | Status: AC
  Filled 2023-06-01: qty 1

## 2023-06-01 MED ORDER — ONDANSETRON HCL 4 MG/2ML IJ SOLN
INTRAMUSCULAR | Status: AC
  Filled 2023-06-01: qty 2

## 2023-06-01 MED ORDER — OXYCODONE HCL 5 MG PO TABS: 5.0000 mg | ORAL_TABLET | Freq: Four times a day (QID) | ORAL | 0 refills | Status: DC | PRN

## 2023-06-01 MED ORDER — OXYCODONE HCL 5 MG PO TABS: 5.0000 mg | ORAL_TABLET | Freq: Once | ORAL | Status: DC | PRN

## 2023-06-01 MED ORDER — LIDOCAINE 2% (20 MG/ML) 5 ML SYRINGE
INTRAMUSCULAR | Status: AC
  Filled 2023-06-01: qty 5

## 2023-06-01 MED ORDER — ACETAMINOPHEN 10 MG/ML IV SOLN: 1000.0000 mg | Freq: Once | INTRAVENOUS | Status: DC | PRN

## 2023-06-01 MED ORDER — DEXAMETHASONE SODIUM PHOSPHATE 10 MG/ML IJ SOLN
INTRAMUSCULAR | Status: AC
  Filled 2023-06-01: qty 1

## 2023-06-01 MED ORDER — MIDAZOLAM HCL 5 MG/5ML IJ SOLN
INTRAMUSCULAR | Status: DC | PRN
  Administered 2023-06-01: 2 mg via INTRAVENOUS

## 2023-06-01 MED ORDER — FENTANYL CITRATE (PF) 100 MCG/2ML IJ SOLN
25.0000 ug | INTRAMUSCULAR | Status: DC | PRN
  Administered 2023-06-01 (×2): 50 ug via INTRAVENOUS

## 2023-06-01 MED ORDER — LACTATED RINGERS IV SOLN: INTRAVENOUS | Status: DC

## 2023-06-01 MED ORDER — CEFAZOLIN SODIUM-DEXTROSE 3-4 GM/150ML-% IV SOLN
3.0000 g | INTRAVENOUS | Status: AC
  Administered 2023-06-01: 3 g via INTRAVENOUS

## 2023-06-01 MED ORDER — DEXMEDETOMIDINE HCL IN NACL 80 MCG/20ML IV SOLN
INTRAVENOUS | Status: AC
  Filled 2023-06-01: qty 20

## 2023-06-01 MED ORDER — DEXMEDETOMIDINE HCL IN NACL 80 MCG/20ML IV SOLN
INTRAVENOUS | Status: DC | PRN
  Administered 2023-06-01 (×2): 8 ug via INTRAVENOUS

## 2023-06-01 MED ORDER — ONDANSETRON HCL 4 MG/2ML IJ SOLN
INTRAMUSCULAR | Status: DC | PRN
  Administered 2023-06-01: 4 mg via INTRAVENOUS

## 2023-06-01 MED ORDER — CEFAZOLIN SODIUM-DEXTROSE 3-4 GM/150ML-% IV SOLN
INTRAVENOUS | Status: AC
  Filled 2023-06-01: qty 150

## 2023-06-01 SURGICAL SUPPLY — 34 items
BENZOIN TINCTURE PRP APPL 2/3 (GAUZE/BANDAGES/DRESSINGS) ×2 IMPLANT
BLADE SURG 15 STRL LF DISP TIS (BLADE) ×2 IMPLANT
CANISTER SUCT 1200ML W/VALVE (MISCELLANEOUS) IMPLANT
CHLORAPREP W/TINT 26 (MISCELLANEOUS) ×2 IMPLANT
COVER BACK TABLE 60X90IN (DRAPES) ×2 IMPLANT
COVER MAYO STAND STRL (DRAPES) ×2 IMPLANT
DRAIN PENROSE 12X.25 LTX STRL (MISCELLANEOUS) IMPLANT
DRAPE LAPAROTOMY 100X72 PEDS (DRAPES) ×2 IMPLANT
DRAPE UTILITY XL STRL (DRAPES) ×2 IMPLANT
DRSG TEGADERM 2-3/8X2-3/4 SM (GAUZE/BANDAGES/DRESSINGS) IMPLANT
DRSG TEGADERM 4X4.75 (GAUZE/BANDAGES/DRESSINGS) IMPLANT
ELECT COATED BLADE 2.86 ST (ELECTRODE) ×2 IMPLANT
ELECTRODE REM PT RTRN 9FT ADLT (ELECTROSURGICAL) ×2 IMPLANT
GAUZE SPONGE 2X2 STRL 8-PLY (GAUZE/BANDAGES/DRESSINGS) IMPLANT
GAUZE SPONGE 4X4 12PLY STRL LF (GAUZE/BANDAGES/DRESSINGS) IMPLANT
GLOVE BIO SURGEON STRL SZ7 (GLOVE) ×2 IMPLANT
GLOVE BIOGEL PI IND STRL 7.5 (GLOVE) ×2 IMPLANT
GOWN STRL REUS W/ TWL LRG LVL3 (GOWN DISPOSABLE) ×4 IMPLANT
NDL HYPO 25X1 1.5 SAFETY (NEEDLE) ×2 IMPLANT
NEEDLE HYPO 25X1 1.5 SAFETY (NEEDLE) ×1 IMPLANT
NS IRRIG 1000ML POUR BTL (IV SOLUTION) ×2 IMPLANT
PACK BASIN DAY SURGERY FS (CUSTOM PROCEDURE TRAY) ×2 IMPLANT
PENCIL SMOKE EVACUATOR (MISCELLANEOUS) ×2 IMPLANT
SLEEVE SCD COMPRESS KNEE MED (STOCKING) ×2 IMPLANT
SPIKE FLUID TRANSFER (MISCELLANEOUS) ×2 IMPLANT
SPONGE T-LAP 4X18 ~~LOC~~+RFID (SPONGE) ×2 IMPLANT
STRIP CLOSURE SKIN 1/2X4 (GAUZE/BANDAGES/DRESSINGS) ×2 IMPLANT
SUT ETHILON 3 0 PS 1 (SUTURE) IMPLANT
SUT MNCRL AB 4-0 PS2 18 (SUTURE) ×2 IMPLANT
SUT VIC AB 3-0 SH 27X BRD (SUTURE) ×2 IMPLANT
SYR CONTROL 10ML LL (SYRINGE) ×2 IMPLANT
TOWEL GREEN STERILE FF (TOWEL DISPOSABLE) ×2 IMPLANT
TUBE CONNECTING 20X1/4 (TUBING) IMPLANT
YANKAUER SUCT BULB TIP NO VENT (SUCTIONS) IMPLANT

## 2023-06-01 NOTE — Transfer of Care (Signed)
 Immediate Anesthesia Transfer of Care Note  Patient: Tina Riggs  Procedure(s) Performed: INCISION AND DRAINAGE LEFT BREAST ABSCESS (Left: Breast)  Patient Location: PACU  Anesthesia Type:General  Level of Consciousness: drowsy  Airway & Oxygen Therapy: Patient Spontanous Breathing and Patient connected to nasal cannula oxygen  Post-op Assessment: Report given to RN  Post vital signs: Reviewed and stable  Last Vitals:  Vitals Value Taken Time  BP 153/103 06/01/23 0834  Temp    Pulse 89 06/01/23 0836  Resp 19 06/01/23 0836  SpO2 96 % 06/01/23 0836  Vitals shown include unfiled device data.  Last Pain:  Vitals:   06/01/23 0732  TempSrc: Temporal  PainSc: 2       Patients Stated Pain Goal: 2 (06/01/23 0732)  Complications: No notable events documented.

## 2023-06-01 NOTE — Anesthesia Procedure Notes (Signed)
 Procedure Name: LMA Insertion Date/Time: 06/01/2023 7:59 AM  Performed by: Junius Olive, CRNAPre-anesthesia Checklist: Patient identified, Emergency Drugs available, Suction available and Patient being monitored Patient Re-evaluated:Patient Re-evaluated prior to induction Oxygen Delivery Method: Circle system utilized Preoxygenation: Pre-oxygenation with 100% oxygen Induction Type: IV induction Ventilation: Mask ventilation without difficulty LMA: LMA inserted LMA Size: 5.0 Number of attempts: 1 Airway Equipment and Method: Bite block Placement Confirmation: positive ETCO2 Tube secured with: Tape Dental Injury: Teeth and Oropharynx as per pre-operative assessment

## 2023-06-01 NOTE — Anesthesia Postprocedure Evaluation (Signed)
 Anesthesia Post Note  Patient: Tina Riggs  Procedure(s) Performed: INCISION AND DRAINAGE LEFT BREAST ABSCESS (Left: Breast)     Patient location during evaluation: PACU Anesthesia Type: General Level of consciousness: awake and alert Pain management: pain level controlled Vital Signs Assessment: post-procedure vital signs reviewed and stable Respiratory status: spontaneous breathing, nonlabored ventilation, respiratory function stable and patient connected to nasal cannula oxygen Cardiovascular status: blood pressure returned to baseline and stable Postop Assessment: no apparent nausea or vomiting Anesthetic complications: no   No notable events documented.  Last Vitals:  Vitals:   06/01/23 0926 06/01/23 0945  BP: 128/82 (!) 145/98  Pulse:  76  Resp:  18  Temp:  (!) 36.2 C  SpO2:  94%    Last Pain:  Vitals:   06/01/23 0945  TempSrc:   PainSc: 0-No pain                 Leslye Rast

## 2023-06-01 NOTE — Anesthesia Preprocedure Evaluation (Addendum)
 Anesthesia Evaluation  Patient identified by MRN, date of birth, ID band Patient awake    Reviewed: Allergy & Precautions, NPO status , Patient's Chart, lab work & pertinent test results, reviewed documented beta blocker date and time   History of Anesthesia Complications Negative for: history of anesthetic complications  Airway Mallampati: II  TM Distance: >3 FB     Dental no notable dental hx.    Pulmonary sleep apnea (OSA suspect) , Current Smoker and Patient abstained from smoking.   breath sounds clear to auscultation       Cardiovascular hypertension, pulmonary hypertension (enlarged PA on CT suggestive of pHTN)(-) CAD, (-) Past MI, (-) Cardiac Stents and (-) CABG (-) dysrhythmias (-) pacemaker Rhythm:Regular Rate:Normal     Neuro/Psych neg Seizures  Neuromuscular disease    GI/Hepatic ,neg GERD  ,,(+) neg Cirrhosis        Endo/Other    Renal/GU Renal disease     Musculoskeletal   Abdominal   Peds  Hematology   Anesthesia Other Findings   Reproductive/Obstetrics                             Anesthesia Physical Anesthesia Plan  ASA: 3  Anesthesia Plan: General   Post-op Pain Management:    Induction: Intravenous  PONV Risk Score and Plan: 2 and Ondansetron   Airway Management Planned: LMA  Additional Equipment:   Intra-op Plan:   Post-operative Plan: Extubation in OR  Informed Consent: I have reviewed the patients History and Physical, chart, labs and discussed the procedure including the risks, benefits and alternatives for the proposed anesthesia with the patient or authorized representative who has indicated his/her understanding and acceptance.     Dental advisory given  Plan Discussed with: CRNA  Anesthesia Plan Comments:        Anesthesia Quick Evaluation

## 2023-06-01 NOTE — Op Note (Signed)
 Pre-op diagnosis: Infected sebaceous cyst left nipple Postop diagnosis: Same Procedure performed: Incision and drainage of left breast abscess Surgeon:Mikkel Charrette K Semaya Vida Anesthesia: General Indications:This is a 43 year old female who presented 2 months ago with a visible palpable lump involving the left nipple. She denied any discharge. She underwent diagnostic mammogram and ultrasound. Mammogram was unremarkable. Ultrasound showed 2.2 cm mass that appeared to be a sebaceous cyst. Subsequently she underwent ultrasound biopsy that revealed a sebaceous cyst. The patient developed cellulitis and some drainage from this area. It has become very tender. She presents now to discuss surgical intervention.  Description of procedure: The patient is brought to the operating room placed in supine position on the operating room table.  After an adequate level general anesthesia was obtained, the patient's left breast was prepped with Betadine and draped sterile fashion.  A timeout was taken to ensure the proper patient and proper procedure.  She has a 2 cm cyst protruding from the lateral side of her nipple.  The central area of the breast lateral to the nipple shows a 5 cm area of induration.  I made a circumareolar incision around the lateral side of the areola.  We dissected down into a small abscess cavity which was evacuated.  I then excised the protruding cyst.  We were able to identify the cyst wall and excised it entirely.  The cyst communicates with the abscess cavity.  We irrigated this thoroughly.  No further purulence was noted.  I explored the wound and it does not seem to tunnel into the larger area of cellulitis.  I placed 1/4 inch Penrose drain through the circumareolar incision down into the abscess cavity for a distance of about 2 cm.  We secured this in place with interrupted 3-0 Ethilon's.  The nipple incision was loosely approximated with interrupted 3-0 Ethilon sutures.  This was left loose to allow  some drainage through the incision.  A dry dressing is applied.  The patient is then extubated and brought to the recovery room in stable condition.  All sponge, instrument, and needle counts are correct.  Kari Otto. Eli Grizzle, MD, [facility] Surgery  General Surgery   06/01/2023 8:34 AM

## 2023-06-01 NOTE — Discharge Instructions (Addendum)
 You have a soft rubber drain coming out of the incision in your left breast.  Expect drainage coming from this area for the next couple of weeks.  Wear dry gauze over the drain site to absorb this drainage.  You may use tape to secure the gauze or you may just use your bra to hold the gauze in place.  When you shower, make sure that you keep a dressing over this area to prevent shower water  from running into the drain.  You can change to the old dressing when you get out of the shower.  We will remove the drain in the office in 2 weeks.  You may use the pain medication as needed.  If it is not needed, you may use Tylenol  or ibuprofen .  You may drive if you are not using the pain medication.   Post Anesthesia Home Care Instructions  Activity: Get plenty of rest for the remainder of the day. A responsible individual must stay with you for 24 hours following the procedure.  For the next 24 hours, DO NOT: -Drive a car -Advertising copywriter -Drink alcoholic beverages -Take any medication unless instructed by your physician -Make any legal decisions or sign important papers.  Meals: Start with liquid foods such as gelatin or soup. Progress to regular foods as tolerated. Avoid greasy, spicy, heavy foods. If nausea and/or vomiting occur, drink only clear liquids until the nausea and/or vomiting subsides. Call your physician if vomiting continues.  Special Instructions/Symptoms: Your throat may feel dry or sore from the anesthesia or the breathing tube placed in your throat during surgery. If this causes discomfort, gargle with warm salt water . The discomfort should disappear within 24 hours.  May have Tylenol  after 1:30pm if needed.

## 2023-06-01 NOTE — Interval H&P Note (Signed)
 History and Physical Interval Note:  06/01/2023 7:25 AM  Tina Riggs  has presented today for surgery, with the diagnosis of LEFT BREAST INFECTED SEBACEOUS CYST.  The various methods of treatment have been discussed with the patient and family. After consideration of risks, benefits and other options for treatment, the patient has consented to  Procedure(s) with comments: INCISION AND DRAINAGE, ABSCESS, BREAST (Left) - LMA INCISION AND DRAINAGE LEFT BREAST INFECTED SEBACEOUS CYST as a surgical intervention.  The patient's history has been reviewed, patient examined, no change in status, stable for surgery.  I have reviewed the patient's chart and labs.  Questions were answered to the patient's satisfaction.     Rella Cardinal

## 2023-06-02 ENCOUNTER — Encounter (HOSPITAL_BASED_OUTPATIENT_CLINIC_OR_DEPARTMENT_OTHER): Payer: Self-pay | Admitting: Surgery

## 2023-06-04 LAB — SURGICAL PATHOLOGY

## 2023-06-14 NOTE — Progress Notes (Signed)
 error

## 2023-06-25 ENCOUNTER — Ambulatory Visit: Attending: Cardiology | Admitting: Cardiology

## 2023-08-09 NOTE — Progress Notes (Signed)
 REFERRING PHYSICIAN:  Belinda Donnice Irving, MD  PROVIDER:  LYNDA LEOS, MD  MRN: [id number] DOB: October 28, 1980 DATE OF ENCOUNTER: 08/09/2023  Subjective   Chief Complaint: New problem (ventral incisional hernia, seen MT/)     History of Present Illness: Tina Riggs is a 43 y.o. female who is seen today as an office consultation at the request of Dr. Belinda for evaluation of New problem (ventral incisional hernia, seen MT/) .   History of Present Illness Tina Riggs is a 43 year old female with a history of diverticulitis and prior colon surgery who presents with multiple hernias.  She has three abdominal hernias, first noticed in 2019 post-childbirth, with one located above the umbilicus. The hernias increase in size over time, sometimes protrude, and are difficult to reduce, though they occasionally retract spontaneously. Episodes of difficult reduction lead to dizziness and mood changes.  She underwent colon surgery with ostomy placement around 2014 or 2015 due to diverticulitis. She also had a C-section.  She smokes nearly a pack of cigarettes daily and is on medication for hypertension. She does not have diabetes.     Review of Systems: A complete review of systems was obtained from the patient.  I have reviewed this information and discussed as appropriate with the patient.  See HPI as well for other ROS.  Review of Systems  Constitutional:  Negative for fever.  HENT:  Negative for congestion.   Eyes:  Negative for blurred vision.  Respiratory:  Negative for cough, shortness of breath and wheezing.   Cardiovascular:  Negative for chest pain and palpitations.  Gastrointestinal:  Negative for heartburn.  Genitourinary:  Negative for dysuria.  Musculoskeletal:  Negative for myalgias.  Skin:  Negative for rash.  Neurological:  Negative for dizziness and headaches.  Psychiatric/Behavioral:  Negative for depression and suicidal ideas.   All other systems reviewed and are  negative.     Medical History: Past Medical History:  Diagnosis Date  . Hypertension     Patient Active Problem List  Diagnosis  . BMI 40.0-44.9, adult (CMS/HHS-HCC)  . Diverticular disease  . Diverticulitis of colon with perforation  . History of cesarean section  . Tobacco use    Past Surgical History:  Procedure Laterality Date  . COLPOSCOPY  09/2010  . COLON SURGERY  06/11/2015  . CESAREAN SECTION  10/17/2017  . excision of sebacous cyst       Allergies  Allergen Reactions  . Methocarbamol  Other (See Comments) and Swelling    Bottom lip swelled  . Penicillins Itching and Rash    Has patient had a PCN reaction causing immediate rash, facial/tongue/throat swelling, SOB or lightheadedness with hypotension: yes  Has patient had a PCN reaction causing severe rash involving mucus membranes or skin necrosis: no  Has patient had a PCN reaction that required hospitalization no  Has patient had a PCN reaction occurring within the last 10 years: no  If all of the above answers are NO, then may proceed with Cephalosporin use.  Pt states she has tolerated Amoxicillin  in the past    Current Outpatient Medications on File Prior to Visit  Medication Sig Dispense Refill  . amLODIPine  (NORVASC ) 10 MG tablet Take 10 mg by mouth once daily    . doxycycline  (VIBRAMYCIN ) 100 MG capsule Take 100 mg by mouth 2 (two) times daily    . ibuprofen  (MOTRIN ) 600 MG tablet Take 600 mg by mouth every 6 (six) hours as needed    . oxyCODONE  (  ROXICODONE ) 5 MG immediate release tablet Take 5 mg by mouth     No current facility-administered medications on file prior to visit.    Family History  Problem Relation Age of Onset  . High blood pressure (Hypertension) Mother   . HIV Mother   . High blood pressure (Hypertension) Sister      Social History   Tobacco Use  Smoking Status Every Day  . Current packs/day: 0.50  . Types: Cigarettes  Smokeless Tobacco Never     Social History    Socioeconomic History  . Marital status: Single  Tobacco Use  . Smoking status: Every Day    Current packs/day: 0.50    Types: Cigarettes  . Smokeless tobacco: Never  Vaping Use  . Vaping status: Unknown  Substance and Sexual Activity  . Alcohol use: Yes    Comment: at least 25oz a day  . Drug use: Never   Social Drivers of Corporate investment banker Strain: Medium Risk (01/05/2023)   Received from [facility]   Overall Financial Resource Strain (CARDIA)   . Difficulty of Paying Living Expenses: Somewhat hard  Food Insecurity: Food Insecurity Present (01/05/2023)   Received from [facility]   Hunger Vital Sign   . Within the past 12 months, you worried that your food would run out before you got the money to buy more.: Often true   . Within the past 12 months, the food you bought just didn't last and you didn't have money to get more.: Never true  Transportation Needs: Unmet Transportation Needs (01/05/2023)   Received from [facility] - Transportation   . Lack of Transportation (Medical): No   . Lack of Transportation (Non-Medical): Yes  Physical Activity: Insufficiently Active (01/05/2023)   Received from [facility]   Exercise Vital Sign   . On average, how many days per week do you engage in moderate to strenuous exercise (like a brisk walk)?: 1 day   . On average, how many minutes do you engage in exercise at this level?: 10 min  Stress: No Stress Concern Present (01/05/2023)   Received from [facility] - Occupational Stress Questionnaire   . Feeling of Stress : Only a little  Social Connections: Moderately Integrated (01/05/2023)   Received from [facility]   Social Connection and Isolation Panel   . In a typical week, how many times do you talk on the phone with family, friends, or neighbors?: More than three times a week   . How often do you get together with friends or relatives?: Twice a week   . How often do you  attend church or religious services?: More than 4 times per year   . Do you belong to any clubs or organizations such as church groups, unions, fraternal or athletic groups, or school groups?: Yes   . How often do you attend meetings of the clubs or organizations you belong to?: More than 4 times per year   . Are you married, widowed, divorced, separated, never married, or living with a partner?: Never married    Objective:    Vitals:   08/09/23 0945 08/09/23 0946  Pulse: 84   Temp: 36.9 C (98.4 F)   SpO2: 98%   Weight: (!) 126 kg (277 lb 12.8 oz)   Height: 167.6 cm (5' 6)   PainSc:    3  PainLoc:  Abdomen    Body mass index is 44.84  kg/m.  Physical Exam  PE:  Constitutional: No acute distress, conversant, appears states age. Eyes: Anicteric sclerae, moist conjunctiva, no lid lag Lungs: Clear to auscultation bilaterally, normal respiratory effort CV: regular rate and rhythm, no murmurs, no peripheral edema, pedal pulses 2+ GI: Soft, no masses or hepatosplenomegaly, non-tender to palpationhernia + rectus diastasis  Skin: No rashes, palpation reveals normal turgor Psychiatric: appropriate judgment and insight, oriented to person, place, and time  Hernia Size:3cm Incarcerated: no Initial Hernia   Assessment and Plan:  Diagnoses and all orders for this visit:  Incisional hernia without obstruction or gangrene    Chealsey Miyamoto is a 43 y.o. female   Assessment & Plan Abdominal hernia above umbilicus Small hernia located just above the umbilicus, present since at least 2019. The hernia is intermittently reducible but has been increasing in size. There is a risk of incarceration and strangulation if not addressed. Smoking is a significant risk factor for recurrence post-repair due to impaired blood flow and healing. Weight loss is advised to reduce intra-abdominal pressure and improve surgical outcomes. - Advise smoking cessation for at least two months prior to surgical  intervention to ensure optimal healing and reduce the risk of recurrence. - Encourage weight loss through exercise to decrease intra-abdominal pressure and aid in healing. - Schedule follow-up appointment in two months to reassess smoking cessation and weight loss progress.  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 This condition involves the separation of the rectus abdominis muscles, leading to a bulging appearance.       Return in about 2 months (around 10/09/2023).  Lynda Leos, MD, [facility] Surgery, [place] [facility] Minimally Invasive Surgery

## 2023-08-12 ENCOUNTER — Encounter (HOSPITAL_COMMUNITY): Payer: Self-pay | Admitting: *Deleted

## 2023-08-12 ENCOUNTER — Telehealth: Payer: Self-pay

## 2023-08-12 ENCOUNTER — Emergency Department (HOSPITAL_COMMUNITY)

## 2023-08-12 ENCOUNTER — Other Ambulatory Visit: Payer: Self-pay

## 2023-08-12 ENCOUNTER — Telehealth (HOSPITAL_COMMUNITY): Payer: Self-pay | Admitting: Emergency Medicine

## 2023-08-12 ENCOUNTER — Emergency Department (HOSPITAL_COMMUNITY)
Admission: EM | Admit: 2023-08-12 | Discharge: 2023-08-12 | Disposition: A | Discharge status: Home / Self Care | Attending: Emergency Medicine | Admitting: Emergency Medicine

## 2023-08-12 DIAGNOSIS — S0083XA Contusion of other part of head, initial encounter: Secondary | ICD-10-CM | POA: Insufficient documentation

## 2023-08-12 DIAGNOSIS — S0592XA Unspecified injury of left eye and orbit, initial encounter: Secondary | ICD-10-CM | POA: Diagnosis not present

## 2023-08-12 DIAGNOSIS — S0993XA Unspecified injury of face, initial encounter: Secondary | ICD-10-CM | POA: Diagnosis present

## 2023-08-12 MED ORDER — TETRACAINE HCL 0.5 % OP SOLN
1.0000 [drp] | Freq: Once | OPHTHALMIC | Status: AC
Start: 2023-08-12 — End: 2023-08-12
  Administered 2023-08-12: 1 [drp] via OPHTHALMIC
  Filled 2023-08-12: qty 4

## 2023-08-12 MED ORDER — OXYCODONE-ACETAMINOPHEN 5-325 MG PO TABS
2.0000 | ORAL_TABLET | Freq: Once | ORAL | Status: AC
  Administered 2023-08-12: 2 via ORAL
  Filled 2023-08-12: qty 2

## 2023-08-12 MED ORDER — OXYCODONE-ACETAMINOPHEN 5-325 MG PO TABS
1.0000 | ORAL_TABLET | Freq: Four times a day (QID) | ORAL | 0 refills | Status: AC | PRN
Start: 2023-08-12 — End: ?

## 2023-08-12 MED ORDER — FLUORESCEIN SODIUM 1 MG OP STRP
1.0000 | ORAL_STRIP | Freq: Once | OPHTHALMIC | Status: AC
  Administered 2023-08-12: 1 via OPHTHALMIC
  Filled 2023-08-12: qty 1

## 2023-08-12 MED ORDER — OXYCODONE-ACETAMINOPHEN 5-325 MG PO TABS: 1.0000 | ORAL_TABLET | Freq: Four times a day (QID) | ORAL | 0 refills | Status: DC | PRN

## 2023-08-12 MED ORDER — POLYMYXIN B-TRIMETHOPRIM 10000-0.1 UNIT/ML-% OP SOLN
1.0000 [drp] | OPHTHALMIC | Status: DC
  Administered 2023-08-12: 1 [drp] via OPHTHALMIC
  Filled 2023-08-12: qty 10

## 2023-08-12 NOTE — ED Notes (Signed)
 Patient transported to CT

## 2023-08-12 NOTE — ED Notes (Signed)
 Questions and concerns addressed. Discharge teaching completed.   Prescriptions reviewed and pharmacy verified.   Pt ambulatory upon discharge with family.
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
## 2023-08-12 NOTE — Telephone Encounter (Cosign Needed)
 Received a message from RN Corean that the pharmacy has closed and needs to be resent. This was resent.  Corean Canary, RN was notified to cancel the previous narcotic prescription at the other pharmacy.

## 2023-08-12 NOTE — Telephone Encounter (Signed)
 Patient called in to say the pharmacy the medicaitons were sent to was closed. Messaged with Provider Charlyn, his team sent script ofver to [facility]. Patient called back and is aware.
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
## 2023-08-12 NOTE — Discharge Instructions (Signed)
 You have a small scrape on the left side of your eye, this should heal over the next few days.  Please use the eyedrops as directed.  Please instill 1 drop every 4 hours for the next 5 days.  Take pain medication as directed.  Do not take if you are not having pain.  Please follow-up with your eye doctor in the next week.  Return for new or worsening symptoms.

## 2023-09-04 ENCOUNTER — Other Ambulatory Visit: Payer: Self-pay

## 2023-09-04 ENCOUNTER — Ambulatory Visit
Admission: EM | Admit: 2023-09-04 | Discharge: 2023-09-04 | Disposition: A | Discharge status: Home / Self Care | Attending: Family Medicine | Admitting: Family Medicine

## 2023-09-04 DIAGNOSIS — N898 Other specified noninflammatory disorders of vagina: Secondary | ICD-10-CM | POA: Insufficient documentation

## 2023-09-04 DIAGNOSIS — J029 Acute pharyngitis, unspecified: Secondary | ICD-10-CM | POA: Insufficient documentation

## 2023-09-04 DIAGNOSIS — R1030 Lower abdominal pain, unspecified: Secondary | ICD-10-CM | POA: Diagnosis not present

## 2023-09-04 DIAGNOSIS — J069 Acute upper respiratory infection, unspecified: Secondary | ICD-10-CM | POA: Insufficient documentation

## 2023-09-04 DIAGNOSIS — Z113 Encounter for screening for infections with a predominantly sexual mode of transmission: Secondary | ICD-10-CM | POA: Insufficient documentation

## 2023-09-04 DIAGNOSIS — R051 Acute cough: Secondary | ICD-10-CM | POA: Diagnosis not present

## 2023-09-04 LAB — POCT URINE DIPSTICK
Glucose, UA: NEGATIVE mg/dL
Ketones, POC UA: NEGATIVE mg/dL
Leukocytes, UA: NEGATIVE
Nitrite, UA: NEGATIVE
POC PROTEIN,UA: 100 — AB
Spec Grav, UA: 1.02 (ref 1.010–1.025)
Urobilinogen, UA: 2 U/dL — AB
pH, UA: 6.5 (ref 5.0–8.0)

## 2023-09-04 LAB — POC COVID19/FLU A&B COMBO
Covid Antigen, POC: NEGATIVE
Influenza A Antigen, POC: NEGATIVE
Influenza B Antigen, POC: NEGATIVE

## 2023-09-04 LAB — POCT RAPID STREP A (OFFICE): Rapid Strep A Screen: NEGATIVE

## 2023-09-04 LAB — POCT URINE PREGNANCY: Preg Test, Ur: NEGATIVE

## 2023-09-04 MED ORDER — BENZONATATE 200 MG PO CAPS: 200.0000 mg | ORAL_CAPSULE | Freq: Three times a day (TID) | ORAL | 0 refills | Status: AC | PRN

## 2023-09-04 NOTE — ED Provider Notes (Addendum)
 [facility]    CSN: [id number] Arrival date & time: 09/04/23  9043      History   Chief Complaint Chief Complaint  Patient presents with   multiple complaints    HPI Tina Riggs is a 43 y.o. female  presents for evaluation of URI symptoms for 3 days. Patient reports associated symptoms of cough, congestion, sore throat, fatigue, nausea, headache. Denies fever, ear pain, body aches, shortness of breath. Patient does not have a hx of asthma. Patient is an active smoker.   Reports no known sick contacts.  In addition patient reports lower abdominal pain that is intermittent for 1 to 2 weeks that does not radiate.  States frequency has improved.  It is associated with nonbloody diarrhea as well as nausea.  No vomiting.  Again no fevers.  She does have a history of umbilical hernia and was told she cannot have surgery until she stops smoking and loses weight.  She denies any pain around the hernia.  She also has a history of diverticulitis status post colon resection with colostomy/colostomy reversal.  She states this pain does not feel like her typical diverticulitis symptoms.  No dysuria.  She is having vaginal discharge for 1 to 2 weeks that is malodorous.  States she has a history of BV.  No known STD exposure but she would like screening.  She also reports she has had intermittent hives that she has had in the past when she has had chlamydia.  She has not taken any OTC treatments for her symptoms.  No other concerns at this time.     HPI  Past Medical History:  Diagnosis Date   Chronic cough    Diverticulitis    hospitalized 04/13/2014; hospitalized 04/29/2014   HPV in female    HTN (hypertension)    Pulmonary hypertension (HCC)    Sciatica of right side    Suppurative hidradenitis    Vaginal Pap smear, abnormal     Patient Active Problem List   Diagnosis Date Noted   History of cesarean section 05/02/2017   BMI 40.0-44.9, adult (HCC) 04/11/2017   Diverticular  disease 06/11/2015   Diverticulitis of colon with perforation 04/13/2014   Tobacco use 04/13/2014    Past Surgical History:  Procedure Laterality Date   BREAST BIOPSY Left 05/08/2023   US  LT BREAST BX W LOC DEV 1ST LESION IMG BX SPEC US  GUIDE 05/08/2023 [facility] MAMMOGRAPHY   CESAREAN SECTION  02/2006   CESAREAN SECTION N/A 10/17/2017   Procedure: REPEAT CESAREAN SECTION;  Surgeon: Eldonna Suzen Octave, MD;  Location: [facility]TH[facility];  Service: Obstetrics;  Laterality: N/A;   COLON SURGERY     COLONOSCOPY N/A 06/11/2015   Procedure: COLONOSCOPY;  Surgeon: Bernarda Ned, MD;  Location: [facility];  Service: General;  Laterality: N/A;   COLOSTOMY N/A 05/18/2014   Procedure: COLOSTOMY;  Surgeon: Dann Hummer, MD;  Location: [facility] OR;  Service: General;  Laterality: N/A;   COLOSTOMY REVERSAL  06/2015   robotic assisted colostomy reversal with LOA/notes 06/16/2015   COLOSTOMY REVISION N/A 05/18/2014   Procedure: COLON RESECTION SIGMOID;  Surgeon: Dann Hummer, MD;  Location: [facility] OR;  Service: General;  Laterality: N/A;   COLPOSCOPY  Sep 2012   INCISION AND DRAINAGE, ABSCESS, BREAST Left 06/01/2023   Procedure: INCISION AND DRAINAGE LEFT BREAST ABSCESS;  Surgeon: Belinda Cough, MD;  Location: [facility];  Service: General;  Laterality: Left;   LAPAROSCOPIC LYSIS OF ADHESIONS N/A 05/18/2014   Procedure:  LAPAROSCOPIC MOBILIZATION OF SPLENIC FLEXURE;  Surgeon: Dann Hummer, MD;  Location: [facility] OR;  Service: General;  Laterality: N/A;    OB History     Gravida  2   Para  1   Term  1   Preterm      AB      Living  1      SAB      IAB      Ectopic      Multiple      Live Births  1            Home Medications    Prior to Admission medications   Medication Sig Start Date End Date Taking? Authorizing Provider  benzonatate  (TESSALON ) 200 MG capsule Take 1 capsule (200 mg total) by mouth 3 (three) times daily as needed. 09/04/23  Yes Court Gracia, Jodi R, NP  albuterol   (VENTOLIN  HFA) 108 (90 Base) MCG/ACT inhaler Inhale 2 puffs into the lungs every 4 (four) hours as needed for wheezing or shortness of breath. Patient not taking: Reported on 03/16/2023 09/26/19   Hall-Potvin, [place], PA-C  amLODipine  (NORVASC ) 10 MG tablet Take 1 tablet (10 mg total) by mouth daily. 01/05/23   Oley Bascom RAMAN, NP  doxycycline  (VIBRAMYCIN ) 100 MG capsule Take 1 capsule (100 mg total) by mouth 2 (two) times daily. 05/27/23   Elnor Jayson LABOR, DO  etonogestrel  (NEXPLANON ) 68 MG IMPL implant 1 each by Subdermal route once. 5 years    [provider]  fluticasone  (FLONASE ) 50 MCG/ACT nasal spray Place 1 spray into both nostrils daily. Patient not taking: Reported on 03/27/2023 09/26/19   Hall-Potvin, Brittany, PA-C  ibuprofen  (ADVIL ) 600 MG tablet Take 1 tablet (600 mg total) by mouth every 6 (six) hours as needed. 05/27/23   Elnor Jayson LABOR, DO  loratadine  (CLARITIN ) 10 MG tablet Take 1 tablet (10 mg total) by mouth daily. 03/27/23   Rudy Carlin LABOR, MD  metroNIDAZOLE  (METROGEL ) 0.75 % vaginal gel Place 1 Applicatorful vaginally 2 (two) times daily. 03/27/23   Rudy Carlin LABOR, MD  oxyCODONE -acetaminophen  (PERCOCET) 5-325 MG tablet Take 1-2 tablets by mouth every 6 (six) hours as needed. 08/12/23   Hildegard Loge, PA-C  predniSONE  (DELTASONE ) 10 MG tablet Take 3 tablets (30 mg total) by mouth daily with breakfast. 05/11/23   Christopher Savannah, PA-C    Family History Family History  Problem Relation Age of Onset   HIV Mother    Hypertension Mother     Social History Social History   Tobacco Use   Smoking status: Every Day    Current packs/day: 0.50    Average packs/day: 0.5 packs/day for 18.0 years (9.0 ttl pk-yrs)    Types: Cigarettes   Smokeless tobacco: Never  Vaping Use   Vaping status: Former  Substance Use Topics   Alcohol use: Yes    Alcohol/week: 0.0 standard drinks of alcohol    Comment: wine   Drug use: No     Allergies   Penicillins and Robaxin   [methocarbamol ]   Review of Systems Review of Systems  Constitutional:  Positive for fatigue.  HENT:  Positive for congestion and sore throat.   Respiratory:  Positive for cough.   Gastrointestinal:  Positive for abdominal pain, diarrhea and nausea.  Genitourinary:  Positive for vaginal discharge.     Physical Exam Triage Vital Signs ED Triage Vitals  Encounter Vitals Group     BP 09/04/23 1036 (!) 137/100     Girls Systolic BP Percentile --  Girls Diastolic BP Percentile --      Boys Systolic BP Percentile --      Boys Diastolic BP Percentile --      Pulse Rate 09/04/23 1036 87     Resp 09/04/23 1036 18     Temp 09/04/23 1036 98.6 F (37 C)     Temp Source 09/04/23 1036 Oral     SpO2 09/04/23 1036 97 %     Weight --      Height --      Head Circumference --      Peak Flow --      Pain Score 09/04/23 1034 7     Pain Loc --      Pain Education --      Exclude from Growth Chart --    No data found.  Updated Vital Signs BP (!) 137/100   Pulse 87   Temp 98.6 F (37 C) (Oral)   Resp 18   LMP 08/05/2023   SpO2 97%   Visual Acuity Right Eye Distance:   Left Eye Distance:   Bilateral Distance:    Right Eye Near:   Left Eye Near:    Bilateral Near:     Physical Exam Vitals and nursing note reviewed.  Constitutional:      General: She is not in acute distress.    Appearance: She is well-developed. She is not ill-appearing.  HENT:     Head: Normocephalic and atraumatic.     Right Ear: Tympanic membrane and ear canal normal.     Left Ear: Tympanic membrane and ear canal normal.     Nose: Congestion present.     Mouth/Throat:     Mouth: Mucous membranes are moist.     Pharynx: Oropharynx is clear. Uvula midline. Posterior oropharyngeal erythema present.     Tonsils: No tonsillar exudate or tonsillar abscesses.  Eyes:     Conjunctiva/sclera: Conjunctivae normal.     Pupils: Pupils are equal, round, and reactive to light.  Cardiovascular:     Rate and  Rhythm: Normal rate and regular rhythm.     Heart sounds: Normal heart sounds.  Pulmonary:     Effort: Pulmonary effort is normal.     Breath sounds: Normal breath sounds. No wheezing or rhonchi.  Abdominal:     General: Bowel sounds are normal.     Palpations: Abdomen is soft. There is no shifting dullness or fluid wave.     Tenderness: There is abdominal tenderness in the right lower quadrant and left lower quadrant. There is no guarding or rebound. Negative signs include Rovsing's sign and McBurney's sign.     Hernia: A hernia is present. Hernia is present in the umbilical area.     Comments: There is a large umbilical hernia that is reducible and nontender.  Musculoskeletal:     Cervical back: Normal range of motion and neck supple.  Lymphadenopathy:     Cervical: No cervical adenopathy.  Skin:    General: Skin is warm and dry.  Neurological:     General: No focal deficit present.     Mental Status: She is alert and oriented to person, place, and time.  Psychiatric:        Mood and Affect: Mood normal.        Behavior: Behavior normal.      UC Treatments / Results  Labs (all labs ordered are listed, but only abnormal results are displayed) Labs Reviewed  POCT URINE DIPSTICK - Abnormal; Notable  for the following components:      Result Value   Clarity, UA hazy (*)    Bilirubin, UA small (*)    Blood, UA small (*)    POC PROTEIN,UA =100 (*)    Urobilinogen, UA 2.0 (*)    All other components within normal limits  POCT RAPID STREP A (OFFICE)  POC COVID19/FLU A&B COMBO  POCT URINE PREGNANCY  CERVICOVAGINAL ANCILLARY ONLY    EKG   Radiology No results found.  Procedures Procedures (including critical care time)  Medications Ordered in UC Medications - No data to display  Initial Impression / Assessment and Plan / UC Course  I have reviewed the triage vital signs and the nursing notes.  Pertinent labs & imaging results that were available during my care of  the patient were reviewed by me and considered in my medical decision making (see chart for details).     Reviewed exam and symptoms with patient.  Vaginal swab/STD testing is ordered and will contact for any positive results.  Patient wishes to await results prior to initiating treatment.  Urine is negative for UTI.  Negative rapid strep, flu and COVID testing.  Reviewed viral upper respiratory illness and symptomatic treatment.  Tessalon  as needed for cough.  Discussed unclear cause of her abdominal pain.  She does state is intermittent and is improving in frequency.  Discussed ER evaluation but she declines at this time.  She also declines treatment for diverticulitis.   Advise close monitoring of the symptoms with PCP follow-up in 2 days for recheck.  Strict ER precautions reviewed and patient verbalized understanding. Final Clinical Impressions(s) / UC Diagnoses   Final diagnoses:  Vaginal discharge  Acute cough  Sore throat  Lower abdominal pain  Viral upper respiratory illness     Discharge Instructions      The clinic will contact you with results of the vaginal swab/STD testing done today if positive.  You may take Tessalon  3 times a day as needed for your cough.  Lots of rest and fluids.  Please follow-up with your PCP if your abdominal pain or other symptoms do not improve.  Please go to the ER if you develop any worsening symptoms.  Hope you feel better soon!     ED Prescriptions     Medication Sig Dispense Auth. Provider   benzonatate  (TESSALON ) 200 MG capsule Take 1 capsule (200 mg total) by mouth 3 (three) times daily as needed. 20 capsule Merlene Dante, Jodi R, NP      PDMP not reviewed this encounter.   Loreda Myla SAUNDERS, NP 09/04/23 1220    Loreda Myla SAUNDERS, NP 09/04/23 [phone]

## 2023-09-04 NOTE — Discharge Instructions (Addendum)
 The clinic will contact you with results of the vaginal swab/STD testing done today if positive.  You may take Tessalon  3 times a day as needed for your cough.  Lots of rest and fluids.  Please follow-up with your PCP if your abdominal pain or other symptoms do not improve.  Please go to the ER if you develop any worsening symptoms.  Hope you feel better soon!

## 2023-09-04 NOTE — ED Triage Notes (Addendum)
 Pt c/o RLQ, LLQ, N/Dx1-2wks. Pt has a white spot on posterior of left hand and upper left arm. PT states the last time she had that she was positive for chlamydia. Wants STI testing. Pt c/o productive cough w/white mucous and states was diaphoretic last night, has fatigue, Hax2d.

## 2023-09-05 ENCOUNTER — Ambulatory Visit (HOSPITAL_COMMUNITY): Payer: Self-pay

## 2023-09-05 LAB — CERVICOVAGINAL ANCILLARY ONLY
Bacterial Vaginitis (gardnerella): POSITIVE — AB
Candida Glabrata: NEGATIVE
Candida Vaginitis: NEGATIVE
Chlamydia: NEGATIVE
Comment: NEGATIVE
Comment: NEGATIVE
Comment: NEGATIVE
Comment: NEGATIVE
Comment: NEGATIVE
Comment: NORMAL
Neisseria Gonorrhea: NEGATIVE
Trichomonas: NEGATIVE

## 2023-09-05 MED ORDER — METRONIDAZOLE 500 MG PO TABS: 500.0000 mg | ORAL_TABLET | Freq: Two times a day (BID) | ORAL | 0 refills | Status: AC

## 2023-12-06 ENCOUNTER — Ambulatory Visit: Payer: Self-pay | Admitting: Cardiology

## 2023-12-30 ENCOUNTER — Ambulatory Visit
Admission: EM | Admit: 2023-12-30 | Discharge: 2023-12-30 | Disposition: A | Discharge status: Home / Self Care | Attending: Emergency Medicine | Admitting: Emergency Medicine

## 2023-12-30 ENCOUNTER — Other Ambulatory Visit: Payer: Self-pay

## 2023-12-30 DIAGNOSIS — Z20822 Contact with and (suspected) exposure to covid-19: Secondary | ICD-10-CM

## 2023-12-30 DIAGNOSIS — I10 Essential (primary) hypertension: Secondary | ICD-10-CM | POA: Diagnosis not present

## 2023-12-30 LAB — POC SOFIA SARS ANTIGEN FIA: SARS Coronavirus 2 Ag: NEGATIVE

## 2023-12-30 NOTE — Discharge Instructions (Addendum)
 Your COVID test is negative.    Your blood pressure is elevated today at 149/106; repeat 151/98.  Please have this rechecked by your primary care provider.

## 2023-12-30 NOTE — ED Provider Notes (Signed)
 " Tina Riggs    CSN: [id number] Arrival date & time: 12/30/23  1419      History   Chief Complaint Chief Complaint  Patient presents with   COVID(NO sxs)    HPI Tina Riggs is a 43 y.o. female.  Patient presents with request for COVID test.  She states she missed work on Christmas Eve and needs a COVID test and note to return to work tomorrow.  She reports cough x 2 years.  Current everyday smoker.  No fever, chest pain, shortness of breath.  Over the last 2 years, she has periodically taken Mucinex for her cough but none recently.  Her medical history includes hypertension, pulmonary hypertension, chronic cough, diverticulitis, obesity.  The history is provided by the patient and medical records.    Past Medical History:  Diagnosis Date   Chronic cough    Diverticulitis    hospitalized 04/13/2014; hospitalized 04/29/2014   HPV in female    HTN (hypertension)    Pulmonary hypertension (HCC)    Sciatica of right side    Suppurative hidradenitis    Vaginal Pap smear, abnormal     Patient Active Problem List   Diagnosis Date Noted   History of cesarean section 05/02/2017   BMI 40.0-44.9, adult (HCC) 04/11/2017   Diverticular disease 06/11/2015   Diverticulitis of colon with perforation 04/13/2014   Tobacco use 04/13/2014    Past Surgical History:  Procedure Laterality Date   BREAST BIOPSY Left 05/08/2023   US  LT BREAST BX W LOC DEV 1ST LESION IMG BX SPEC US  GUIDE 05/08/2023 [facility] MAMMOGRAPHY   CESAREAN SECTION  02/2006   CESAREAN SECTION N/A 10/17/2017   Procedure: REPEAT CESAREAN SECTION;  Surgeon: Eldonna Suzen Octave, MD;  Location: [facility];  Service: Obstetrics;  Laterality: N/A;   COLON SURGERY     COLONOSCOPY N/A 06/11/2015   Procedure: COLONOSCOPY;  Surgeon: Bernarda Ned, MD;  Location: [facility] OR[facility];  Service: General;  Laterality: N/A;   COLOSTOMY N/A 05/18/2014   Procedure: COLOSTOMY;  Surgeon: Dann Hummer, MD;  Location: [facility] OR;  Service:  General;  Laterality: N/A;   COLOSTOMY REVERSAL  06/2015   robotic assisted colostomy reversal with LOA/notes 06/16/2015   COLOSTOMY REVISION N/A 05/18/2014   Procedure: COLON RESECTION SIGMOID;  Surgeon: Dann Hummer, MD;  Location: [facility] OR;  Service: General;  Laterality: N/A;   COLPOSCOPY  Sep 2012   INCISION AND DRAINAGE, ABSCESS, BREAST Left 06/01/2023   Procedure: INCISION AND DRAINAGE LEFT BREAST ABSCESS;  Surgeon: Belinda Cough, MD;  Location:  SURGERY CENTER;  Service: General;  Laterality: Left;   LAPAROSCOPIC LYSIS OF ADHESIONS N/A 05/18/2014   Procedure: LAPAROSCOPIC MOBILIZATION OF SPLENIC FLEXURE;  Surgeon: Dann Hummer, MD;  Location: [facility] OR;  Service: General;  Laterality: N/A;    OB History     Gravida  2   Para  1   Term  1   Preterm      AB      Living  1      SAB      IAB      Ectopic      Multiple      Live Births  1            Home Medications    Prior to Admission medications  Medication Sig Start Date End Date Taking? Authorizing Provider  albuterol  (VENTOLIN  HFA) 108 (90 Base) MCG/ACT inhaler Inhale 2 puffs into the lungs every 4 (four) hours  as needed for wheezing or shortness of breath. Patient not taking: Reported on 03/16/2023 09/26/19   Hall-Potvin, Brittany, PA-C  amLODipine  (NORVASC ) 10 MG tablet Take 1 tablet (10 mg total) by mouth daily. 01/05/23   Oley Bascom RAMAN, NP  benzonatate  (TESSALON ) 200 MG capsule Take 1 capsule (200 mg total) by mouth 3 (three) times daily as needed. 09/04/23   Mayer, Jodi R, NP  doxycycline  (VIBRAMYCIN ) 100 MG capsule Take 1 capsule (100 mg total) by mouth 2 (two) times daily. 05/27/23   Elnor Jayson LABOR, DO  etonogestrel  (NEXPLANON ) 68 MG IMPL implant 1 each by Subdermal route once. 5 years    [provider]  fluticasone  (FLONASE ) 50 MCG/ACT nasal spray Place 1 spray into both nostrils daily. Patient not taking: Reported on 03/27/2023 09/26/19   Hall-Potvin, Brittany, PA-C  ibuprofen  (ADVIL )  600 MG tablet Take 1 tablet (600 mg total) by mouth every 6 (six) hours as needed. 05/27/23   Elnor Jayson LABOR, DO  loratadine  (CLARITIN ) 10 MG tablet Take 1 tablet (10 mg total) by mouth daily. 03/27/23   Rudy Carlin LABOR, MD  metroNIDAZOLE  (METROGEL ) 0.75 % vaginal gel Place 1 Applicatorful vaginally 2 (two) times daily. 03/27/23   Rudy Carlin LABOR, MD  oxyCODONE -acetaminophen  (PERCOCET) 5-325 MG tablet Take 1-2 tablets by mouth every 6 (six) hours as needed. 08/12/23   Hildegard Loge, PA-C  predniSONE  (DELTASONE ) 10 MG tablet Take 3 tablets (30 mg total) by mouth daily with breakfast. 05/11/23   Christopher Savannah, PA-C    Family History Family History  Problem Relation Age of Onset   HIV Mother    Hypertension Mother     Social History Social History[1]   Allergies   Penicillins and Robaxin  [methocarbamol ]   Review of Systems Review of Systems  Constitutional:  Negative for chills and fever.  HENT:  Negative for ear pain and sore throat.   Respiratory:  Positive for cough. Negative for shortness of breath.   Cardiovascular:  Negative for chest pain and palpitations.     Physical Exam Triage Vital Signs ED Triage Vitals [12/30/23 1519]  Encounter Vitals Group     BP (!) 149/106     Girls Systolic BP Percentile      Girls Diastolic BP Percentile      Boys Systolic BP Percentile      Boys Diastolic BP Percentile      Pulse Rate 84     Resp (!) 23     Temp 98.2 F (36.8 C)     Temp Source Oral     SpO2 94 %     Weight      Height      Head Circumference      Peak Flow      Pain Score 0     Pain Loc      Pain Education      Exclude from Growth Chart    No data found.  Updated Vital Signs BP (!) 151/98   Pulse 84   Temp 98.2 F (36.8 C) (Oral)   Resp (!) 23   SpO2 96%   Visual Acuity Right Eye Distance:   Left Eye Distance:   Bilateral Distance:    Right Eye Near:   Left Eye Near:    Bilateral Near:     Physical Exam Constitutional:      General: She is not in  acute distress.    Appearance: She is obese.  HENT:     Right Ear:  Tympanic membrane normal.     Left Ear: Tympanic membrane normal.     Nose: Nose normal.     Mouth/Throat:     Mouth: Mucous membranes are moist.     Pharynx: Oropharynx is clear.  Cardiovascular:     Rate and Rhythm: Normal rate and regular rhythm.     Heart sounds: Normal heart sounds.  Pulmonary:     Effort: Pulmonary effort is normal. No respiratory distress.     Breath sounds: Normal breath sounds.  Neurological:     Mental Status: She is alert.      UC Treatments / Results  Labs (all labs ordered are listed, but only abnormal results are displayed) Labs Reviewed  POC SOFIA SARS ANTIGEN FIA - Normal    EKG   Radiology No results found.  Procedures Procedures (including critical care time)  Medications Ordered in UC Medications - No data to display  Initial Impression / Assessment and Plan / UC Course  I have reviewed the triage vital signs and the nursing notes.  Pertinent labs & imaging results that were available during my care of the patient were reviewed by me and considered in my medical decision making (see chart for details).    Negative COVID test, elevated blood pressure reading with hypertension.  COVID test done today per patient request and is negative.  Work note provided per patient request.  Discussed continued symptomatic treatment as needed for her chronic cough that has been present for 2 years.  Current everyday smoker.  Discussed with patient that her blood pressure is elevated today and needs to be rechecked by her PCP.  Education provided on managing hypertension.  She agrees to plan of care.  Final Clinical Impressions(s) / UC Diagnoses   Final diagnoses:  Lab test negative for COVID-19 virus  Elevated blood pressure reading in office with diagnosis of hypertension     Discharge Instructions      Your COVID test is negative.    Your blood pressure is elevated  today at 149/106; repeat 151/98.  Please have this rechecked by your primary care provider.          ED Prescriptions   None    PDMP not reviewed this encounter.    [1]  Social History Tobacco Use   Smoking status: Every Day    Current packs/day: 0.50    Average packs/day: 0.5 packs/day for 18.0 years (9.0 ttl pk-yrs)    Types: Cigarettes   Smokeless tobacco: Never  Vaping Use   Vaping status: Former  Substance Use Topics   Alcohol use: Yes    Alcohol/week: 0.0 standard drinks of alcohol    Comment: wine   Drug use: No     Tina Riggs DEL, NP 12/30/23 1550  "

## 2023-12-30 NOTE — ED Triage Notes (Signed)
 Pt is here wanting a COVID test after being around people with COVID but denies sxs. She is needing a doctor's note to go back to work.
